# Patient Record
Sex: Male | Born: 1955
Health system: Southern US, Community
[De-identification: ages and names within clinical notes are randomized; demographics above are authoritative.]

## PROBLEM LIST (undated history)

## (undated) DIAGNOSIS — I739 Peripheral vascular disease, unspecified: Secondary | ICD-10-CM

## (undated) DIAGNOSIS — J45909 Unspecified asthma, uncomplicated: Secondary | ICD-10-CM

## (undated) DIAGNOSIS — C801 Malignant (primary) neoplasm, unspecified: Secondary | ICD-10-CM

## (undated) DIAGNOSIS — E781 Pure hyperglyceridemia: Principal | ICD-10-CM

## (undated) DIAGNOSIS — I10 Essential (primary) hypertension: Secondary | ICD-10-CM

## (undated) DIAGNOSIS — E1165 Type 2 diabetes mellitus with hyperglycemia: Secondary | ICD-10-CM

## (undated) DIAGNOSIS — M1611 Unilateral primary osteoarthritis, right hip: Secondary | ICD-10-CM

## (undated) DIAGNOSIS — R0989 Other specified symptoms and signs involving the circulatory and respiratory systems: Secondary | ICD-10-CM

## (undated) DIAGNOSIS — Z1211 Encounter for screening for malignant neoplasm of colon: Secondary | ICD-10-CM

## (undated) DIAGNOSIS — L02419 Cutaneous abscess of limb, unspecified: Secondary | ICD-10-CM

## (undated) DIAGNOSIS — I2510 Atherosclerotic heart disease of native coronary artery without angina pectoris: Secondary | ICD-10-CM

## (undated) DIAGNOSIS — E1151 Type 2 diabetes mellitus with diabetic peripheral angiopathy without gangrene: Secondary | ICD-10-CM

## (undated) DIAGNOSIS — E1159 Type 2 diabetes mellitus with other circulatory complications: Secondary | ICD-10-CM

## (undated) DIAGNOSIS — L97523 Non-pressure chronic ulcer of other part of left foot with necrosis of muscle: Secondary | ICD-10-CM

## (undated) DIAGNOSIS — E785 Hyperlipidemia, unspecified: Secondary | ICD-10-CM

## (undated) DIAGNOSIS — J189 Pneumonia, unspecified organism: Secondary | ICD-10-CM

## (undated) DIAGNOSIS — Z79899 Other long term (current) drug therapy: Secondary | ICD-10-CM

## (undated) DIAGNOSIS — M86172 Other acute osteomyelitis, left ankle and foot: Secondary | ICD-10-CM

## (undated) DIAGNOSIS — L03119 Cellulitis of unspecified part of limb: Secondary | ICD-10-CM

## (undated) DIAGNOSIS — E1169 Type 2 diabetes mellitus with other specified complication: Secondary | ICD-10-CM

## (undated) DIAGNOSIS — I219 Acute myocardial infarction, unspecified: Secondary | ICD-10-CM

## (undated) DIAGNOSIS — E11621 Type 2 diabetes mellitus with foot ulcer: Secondary | ICD-10-CM

## (undated) DIAGNOSIS — M009 Pyogenic arthritis, unspecified: Secondary | ICD-10-CM

## (undated) HISTORY — DX: Pyogenic arthritis, unspecified: M00.9

## (undated) HISTORY — PX: PR VEIN BYPASS GRAFT,AORTO-FEM-POP: 35551

## (undated) HISTORY — DX: Cellulitis of unspecified part of limb: L03.119

## (undated) HISTORY — DX: Other specified symptoms and signs involving the circulatory and respiratory systems: R09.89

## (undated) HISTORY — DX: Type 2 diabetes mellitus with foot ulcer: E11.621

## (undated) HISTORY — DX: Type 2 diabetes mellitus with other specified complication: E11.69

## (undated) HISTORY — DX: Peripheral vascular disease, unspecified: I73.9

## (undated) HISTORY — DX: Encounter for screening for malignant neoplasm of colon: Z12.11

## (undated) HISTORY — DX: Other acute osteomyelitis, left ankle and foot: M86.172

## (undated) HISTORY — DX: Type 2 diabetes mellitus with diabetic peripheral angiopathy without gangrene: E11.51

## (undated) HISTORY — DX: Type 2 diabetes mellitus with hyperglycemia: E11.65

## (undated) HISTORY — DX: Other long term (current) drug therapy: Z79.899

## (undated) HISTORY — DX: Essential (primary) hypertension: I10

## (undated) HISTORY — DX: Type 2 diabetes mellitus with other circulatory complications: E11.59

## (undated) HISTORY — DX: Pure hyperglyceridemia: E78.1

## (undated) HISTORY — DX: Unilateral primary osteoarthritis, right hip: M16.11

## (undated) HISTORY — DX: Atherosclerotic heart disease of native coronary artery without angina pectoris: I25.10

## (undated) HISTORY — PX: TONSILLECTOMY: SUR1361

## (undated) HISTORY — DX: Cutaneous abscess of limb, unspecified: L02.419

## (undated) HISTORY — PX: LEG SURGERY: SHX1003

## (undated) HISTORY — DX: Non-pressure chronic ulcer of other part of left foot with necrosis of muscle: L97.523

## (undated) HISTORY — PX: CARDIAC CATHETERIZATION: SHX172

## (undated) HISTORY — DX: Hyperlipidemia, unspecified: E78.5

---

## 1979-05-06 HISTORY — PX: LEG SURGERY: SHX1003

## 1998-04-14 ENCOUNTER — Emergency Department (HOSPITAL_COMMUNITY): Admission: EM | Admit: 1998-04-14 | Discharge: 1998-04-14 | Payer: Self-pay | Admitting: Emergency Medicine

## 1999-02-03 ENCOUNTER — Emergency Department (HOSPITAL_COMMUNITY): Admission: EM | Admit: 1999-02-03 | Discharge: 1999-02-03 | Payer: Self-pay | Admitting: Emergency Medicine

## 2006-11-21 ENCOUNTER — Emergency Department (HOSPITAL_COMMUNITY): Admission: EM | Admit: 2006-11-21 | Discharge: 2006-11-21 | Payer: Self-pay

## 2007-10-12 ENCOUNTER — Ambulatory Visit: Payer: Self-pay | Admitting: Cardiovascular Disease

## 2007-10-12 ENCOUNTER — Encounter: Payer: Self-pay | Admitting: Emergency Medicine

## 2007-10-12 ENCOUNTER — Inpatient Hospital Stay (HOSPITAL_COMMUNITY): Admission: EM | Admit: 2007-10-12 | Discharge: 2007-10-15 | Payer: Self-pay | Admitting: Cardiovascular Disease

## 2007-10-13 ENCOUNTER — Encounter: Payer: Self-pay | Admitting: Cardiovascular Disease

## 2008-02-01 ENCOUNTER — Ambulatory Visit: Payer: Self-pay | Admitting: Cardiology

## 2008-02-18 ENCOUNTER — Ambulatory Visit: Payer: Self-pay

## 2008-03-06 ENCOUNTER — Ambulatory Visit: Payer: Self-pay | Admitting: Cardiovascular Disease

## 2008-04-16 DIAGNOSIS — I2510 Atherosclerotic heart disease of native coronary artery without angina pectoris: Secondary | ICD-10-CM

## 2008-04-16 DIAGNOSIS — I10 Essential (primary) hypertension: Secondary | ICD-10-CM

## 2008-04-16 DIAGNOSIS — E785 Hyperlipidemia, unspecified: Secondary | ICD-10-CM

## 2008-04-16 DIAGNOSIS — F17200 Nicotine dependence, unspecified, uncomplicated: Secondary | ICD-10-CM

## 2008-04-16 HISTORY — DX: Atherosclerotic heart disease of native coronary artery without angina pectoris: I25.10

## 2008-04-16 HISTORY — DX: Essential (primary) hypertension: I10

## 2008-04-16 HISTORY — DX: Hyperlipidemia, unspecified: E78.5

## 2009-02-09 ENCOUNTER — Telehealth: Payer: Self-pay | Admitting: Cardiology

## 2009-06-06 ENCOUNTER — Ambulatory Visit: Payer: Self-pay | Admitting: Cardiology

## 2009-06-11 ENCOUNTER — Encounter (INDEPENDENT_AMBULATORY_CARE_PROVIDER_SITE_OTHER): Payer: Self-pay

## 2009-06-11 ENCOUNTER — Ambulatory Visit: Payer: Self-pay | Admitting: Cardiology

## 2009-06-11 ENCOUNTER — Ambulatory Visit: Payer: Self-pay | Admitting: Cardiovascular Disease

## 2009-06-20 ENCOUNTER — Ambulatory Visit (HOSPITAL_COMMUNITY): Admission: RE | Admit: 2009-06-20 | Discharge: 2009-06-20 | Payer: Self-pay | Admitting: Cardiovascular Disease

## 2009-06-20 ENCOUNTER — Ambulatory Visit: Payer: Self-pay | Admitting: Cardiovascular Disease

## 2009-06-22 DIAGNOSIS — I7389 Other specified peripheral vascular diseases: Secondary | ICD-10-CM

## 2009-06-22 DIAGNOSIS — E1159 Type 2 diabetes mellitus with other circulatory complications: Secondary | ICD-10-CM | POA: Insufficient documentation

## 2009-06-22 HISTORY — DX: Type 2 diabetes mellitus with other circulatory complications: E11.59

## 2009-06-27 ENCOUNTER — Ambulatory Visit: Payer: Self-pay | Admitting: Endocrinology

## 2009-07-04 ENCOUNTER — Ambulatory Visit: Payer: Self-pay | Admitting: Endocrinology

## 2009-07-05 ENCOUNTER — Telehealth (INDEPENDENT_AMBULATORY_CARE_PROVIDER_SITE_OTHER): Payer: Self-pay | Admitting: *Deleted

## 2009-07-10 ENCOUNTER — Telehealth (INDEPENDENT_AMBULATORY_CARE_PROVIDER_SITE_OTHER): Payer: Self-pay | Admitting: *Deleted

## 2009-07-11 ENCOUNTER — Telehealth: Payer: Self-pay | Admitting: Endocrinology

## 2009-07-16 ENCOUNTER — Ambulatory Visit: Payer: Self-pay | Admitting: Surgery

## 2009-07-20 ENCOUNTER — Ambulatory Visit: Payer: Self-pay | Admitting: Cardiology

## 2009-07-24 ENCOUNTER — Ambulatory Visit: Payer: Self-pay | Admitting: Cardiology

## 2009-07-24 LAB — CONVERTED CEMR LAB
Albumin: 3.7 g/dL (ref 3.5–5.2)
Alkaline Phosphatase: 48 units/L (ref 39–117)
Cholesterol: 163 mg/dL (ref 0–200)
Direct LDL: 72 mg/dL
HDL: 36 mg/dL — ABNORMAL LOW (ref >39.00)
Triglycerides: 448 mg/dL — ABNORMAL HIGH (ref 0.0–149.0)

## 2009-08-03 ENCOUNTER — Ambulatory Visit: Payer: Self-pay | Admitting: Endocrinology

## 2009-08-14 ENCOUNTER — Ambulatory Visit: Payer: Self-pay | Admitting: Surgery

## 2010-01-15 ENCOUNTER — Telehealth: Payer: Self-pay | Admitting: Cardiology

## 2010-01-22 ENCOUNTER — Ambulatory Visit: Payer: Self-pay | Admitting: Cardiology

## 2010-04-08 ENCOUNTER — Telehealth: Payer: Self-pay | Admitting: Cardiology

## 2010-04-30 ENCOUNTER — Telehealth (INDEPENDENT_AMBULATORY_CARE_PROVIDER_SITE_OTHER): Payer: Self-pay | Admitting: *Deleted

## 2010-05-01 ENCOUNTER — Telehealth: Payer: Self-pay | Admitting: Endocrinology

## 2010-06-02 LAB — CONVERTED CEMR LAB
AST: 18 units/L (ref 0–37)
Alkaline Phosphatase: 47 units/L (ref 39–117)
Basophils Absolute: 0 10*3/uL (ref 0.0–0.1)
Basophils Relative: 0.1 % (ref 0.0–3.0)
Bilirubin, Direct: 0.1 mg/dL (ref 0.0–0.3)
CO2: 27 meq/L (ref 19–32)
Calcium: 9 mg/dL (ref 8.4–10.5)
Calcium: 9.9 mg/dL (ref 8.4–10.5)
Chloride: 101 meq/L (ref 96–112)
Chloride: 103 meq/L (ref 96–112)
Cholesterol: 315 mg/dL — ABNORMAL HIGH (ref 0–200)
Creatinine, Ser: 0.8 mg/dL (ref 0.4–1.5)
Direct LDL: 25.5 mg/dL
Direct LDL: 85.3 mg/dL
GFR calc non Af Amer: 106.83 mL/min (ref >60)
Glucose, Bld: 269 mg/dL — ABNORMAL HIGH (ref 70–99)
HCT: 49.7 % (ref 39.0–52.0)
HDL: 20.7 mg/dL — ABNORMAL LOW (ref >39.00)
Hemoglobin: 17.6 g/dL — ABNORMAL HIGH (ref 13.0–17.0)
Hgb A1c MFr Bld: 9.3 % — ABNORMAL HIGH (ref 4.6–6.5)
INR: 1 (ref 0.8–1.0)
MCV: 94.4 fL (ref 78.0–100.0)
Monocytes Absolute: 0.8 10*3/uL (ref 0.1–1.0)
Monocytes Relative: 9.2 % (ref 3.0–12.0)
Platelets: 216 10*3/uL (ref 150.0–400.0)
Potassium: 4.4 meq/L (ref 3.5–5.1)
Prothrombin Time: 10 s (ref 9.1–11.7)
RBC: 5.26 M/uL (ref 4.22–5.81)
RDW: 12.8 % (ref 11.5–14.6)
Sodium: 134 meq/L — ABNORMAL LOW (ref 135–145)
Sodium: 135 meq/L (ref 135–145)
Total CHOL/HDL Ratio: 8
Total Protein: 7 g/dL (ref 6.0–8.3)
Triglycerides: 1803 mg/dL — ABNORMAL HIGH (ref 0.0–149.0)
VLDL: 152.4 mg/dL — ABNORMAL HIGH (ref 0.0–40.0)

## 2010-06-04 NOTE — Assessment & Plan Note (Signed)
Summary: pvd w/claudication      Allergies Added: NKDA  Visit Type:  Follow-up Primary Provider:  None  CC:  Claudication.  History of Present Illness: This is a 55 year-old male with coronary and peripheral arterial disease, presenting for follow-up evaluation of his intermittent claudication. He has a history of lifestyle limiting claudication, right worse than left, with prior ABI of 0.49 on the right and known distal SFA occlusion. He also had a NSTEMI and was treated medically because he refused cath secondary to financial constraints.  He now reports progressive right leg pain with minimal activity, predominately involving the calf muscles. He sometimes has leg pain at rest in the right calf and foot. He has pain within a few steps and has begun walking with a cane. Denies ulcerations. He also has progressive pain in the left calf and foot, but not as bad as the right. complains of numbness and hypersensistivity in both legs.  No chest pain or shortness of breath. No stroke or TIa symptoms. He is taking ASA and Plavix daily. Smokes 1 ppd - prescribed Chantix by Dr Shirlee Latch but hasn't picked up Rx yet.  Current Medications (verified): 1)  Lisinopril 40 Mg Tabs (Lisinopril) .... Take One Tablet Once Daily 2)  Pravastatin Sodium 40 Mg Tabs (Pravastatin Sodium) .... Take 2 Tablets At Bedtime 3)  Metoprolol Tartrate 50 Mg Tabs (Metoprolol Tartrate) .... Take One Tablet Two Times A Day 4)  Plavix 75 Mg Tabs (Clopidogrel Bisulfate) .... Take One Tablet Once Daily 5)  Aspir-Low 81 Mg Tbec (Aspirin) .... One Daily  Allergies (verified): No Known Drug Allergies  Past History:  Past medical, surgical, family and social histories (including risk factors) reviewed, and no changes noted (except as noted below).  Past Medical History: Reviewed history from 06/06/2009 and no changes required. 1. Non-Hodgkin lymphoma.  This was treated about 10 years ago with radiation, it is now in remission.   He had it in his axillary area and inguinal areas. 2. Formerly morbidly obese, but lost 100 pounds by diet and exercise. 3. Hypertension. 4. Hypercholesterolemia. 5. History of alcohol abuse. 6. Current smoker.  He smokes a pack a day.  ? COPD.  7. Coronary artery disease.  The patient did have a non-ST-elevation MI in June 2009 that was treated medically.  The patient did refuse heart catheterization. 8. Echocardiogram done in June 2009 showed an EF of 60%, mild LVH, no regional wall motion abnormalities.  9. PAD.  He underwent ABIs and lower extremity duplex scanning on February 18, 2008.  This demonstrated occlusion of the distal right superficial femoral artery with reconstitution at the level of the popliteal artery.  There appeared to be some degree of inflow disease because of biphasic common femoral waveforms.  The ABI was 0.49 on the right and 0.87 on the left.   Past Surgical History: none  Family History: Reviewed history from 04/16/2008 and no changes required. The patient's mother had a myocardial infarction in her 30s.  He had 2 aunts with myocardial infarctions in their 64s.  His father had peripheral arterial disease.   Social History: Reviewed history from 06/06/2009 and no changes required. The patient lives with his wife.  He is an unemployed Investment banker, operational, originally from [place].  He has no children.  He smokes 1 pack a day and he has done so for years.  He used to drink heavily but has quit.   Review of Systems       Negative except  as per HPI   Vital Signs:  Patient profile:   55 year old male Height:      69.5 inches Weight:      201 pounds BMI:     29.36 Pulse rate:   76 / minute Pulse rhythm:   regular Resp:     18 per minute BP sitting:   120 / 80  (left arm) Cuff size:   large  Vitals Entered By: Vikki Ports (June 11, 2009 10:51 AM)  Serial Vital Signs/Assessments:  Time      Position  BP       Pulse  Resp  Temp     By           R Arm      116/80                         Vikki Ports   Physical Exam  General:  Pt is alert and oriented, in no acute distress. HEENT: poor dentition, otherwise normal Neck: normal carotid upstrokes without bruits, JVP normal Lungs: CTA CV: RRR without murmur or gallop Abd: soft, NT, positive BS, no bruit, no organomegaly Ext: no clubbing, cyanosis, or edema. femoral pulses 2+= with bilateral bruits, pedal pulses not palpable, right toes are cool but not discolored Skin: warm and dry without rash    Impression & Recommendations:  Problem # 1:  ATHEROSCLEROSIS W/ REST PAIN (ICD-440.22) The patient has progressive symptoms of lower extremity arterial insufficiency, now with symptoms on minimal activity and occasionally at rest. With an ABI approaching the severe range over one year ago, and now with progressive symptoms, I have recommended lower extremity angiography with consideration of PTA vs surgical revascularization. He continues on Aspirin and Plavix. Unfortunately he continues to smoke, and has been prescribed Chantix by Dr Shirlee Latch - I reinforced the importance of tobacco cessation with respect to his PAD outcome. Risks and indications of lower extremity angiography were reviewed with the pt and his wife in detail, and they agree to proceed.  Problem # 2:  CAD, NATIVE VESSEL (ICD-414.01) The pt has had a NSTEMI. He has no symptoms at present, but is extremely limited by his leg problems. If he requires surgical revascularization for PAD, he will need to be risk-stratified with a nuclear stress study. I will confer with Dr Shirlee Latch at the time of his angiogram to further discuss.  His updated medication list for this problem includes:    Lisinopril 40 Mg Tabs (Lisinopril) .Marland Kitchen... Take one tablet once daily    Metoprolol Tartrate 50 Mg Tabs (Metoprolol tartrate) .Marland Kitchen... Take one tablet two times a day    Plavix 75 Mg Tabs (Clopidogrel bisulfate) .Marland Kitchen... Take one tablet once daily    Aspir-low 81 Mg  Tbec (Aspirin) ..... One daily  Orders: TLB-BMP (Basic Metabolic Panel-BMET) (80048-METABOL) TLB-Lipid Panel (80061-LIPID) TLB-Hepatic/Liver Function Pnl (80076-HEPATIC) TLB-CBC Platelet - w/Differential (85025-CBCD) TLB-PT (Protime) (85610-PTP) PV Procedure (PV Procedure)  Problem # 3:  TOBACCO ABUSE (ICD-305.1) Reiterated the importance of tobacco cessation.  Patient Instructions: 1)  Your physician has requested that you have a peripheral vascular angiogram. This exam is performed at the hospital. During this exam IV contrast is used to look at arterial blood flow.  Please review the information sheet given for details.

## 2010-06-04 NOTE — Letter (Signed)
Summary: Peripheral Vascular  [facility], Main Office  [address]   [place], [place] [address]   Phone: [phone]  Fax: [phone]     06/11/2009 MRN: [id number]  [name] [address] [place], [place]  [address]  Dear Mr. MITTELMAN,   You are scheduled for Peripheral Vascular Angiogram on Wednesday June 20, 2009 with Dr. Excell Seltzer.  Please arrive at the [facility] (Go to the [employer] on [address]) of [facility] at 11:00 a.m. on the day of your procedure.  1. DIET     _X___ Nothing to eat or drink after midnight except your medications with a sip of water.  2. MAKE SURE YOU TAKE YOUR ASPIRIN AND PLAVIX.  3. __X__ YOU MAY TAKE ALL of your remaining medications with a small amount of water.  4. Plan for one night stay - bring personal belongings (i.e. toothpaste, toothbrush, etc.)  5. Bring a current list of your medications and current insurance cards.  6. Must have a responsible person to drive you home.   7. Someone must be with you for the first 24 hours after you arrive home.  8. Please wear clothes that are easy to get on and off and wear slip-on shoes.  *Special note: Every effort is made to have your procedure done on time.  Occasionally there are emergencies that present themselves at the hospital that may cause delays.  Please be patient if a delay does occur.  If you have any questions after you get home, please call the office at the number listed above.   Julieta Gutting, RN, BSN

## 2010-06-04 NOTE — Progress Notes (Signed)
Summary: refill request  Phone Note Refill Request Message from:  Patient on January 15, 2010 2:51 PM  Refills Requested: Medication #1:  LOVAZA 1 GM CAPS take 2 capsules twice a day  Medication #2:  PLAVIX 75 MG TABS take one tablet once daily pt requesting lovaza for 90 day supply [facility]   Method Requested: Telephone to Pharmacy Initial call taken by: Glynda Jaeger,  January 15, 2010 2:51 PM  Follow-up for Phone Call       Follow-up by: Judithe Modest CMA,  January 15, 2010 4:30 PM    Prescriptions: LOVAZA 1 GM CAPS (OMEGA-3-ACID ETHYL ESTERS) take 2 capsules twice a day  #360 x 1   Entered by:   Judithe Modest CMA   Authorized by:   [name], MD   Signed by:   Judithe Modest CMA on 01/15/2010   Method used:   Electronically to        [facility] (retail)       [address]       [place], [place]  [address]       Ph: [phone]       Fax: [phone]   RxID:   [id number] PLAVIX 75 MG TABS (CLOPIDOGREL BISULFATE) take one tablet once daily  #90 x 3   Entered by:   Judithe Modest CMA   Authorized by:   [name], MD   Signed by:   Judithe Modest CMA on 01/15/2010   Method used:   Electronically to        [facility] (retail)       [address]       [place], [place]  [address]       Ph: [phone]       Fax: [phone]   RxID:   [id number]

## 2010-06-04 NOTE — Progress Notes (Signed)
Summary: rx refill  Phone Note Refill Request Message from:  Patient on April 08, 2010 8:17 AM  CILOSTAZOL 100MG  TABLETS please call in it to [facility]# [phone]   Method Requested: Telephone to Pharmacy Initial call taken by: Roe Coombs,  April 08, 2010 8:19 AM    Prescriptions: PLETAL 100 MG TABS (CILOSTAZOL) one tablet twice a day  #60 Tablet x 8   Entered by:   Hardin Negus, RMA   Authorized by:   [name], MD   Signed by:   Hardin Negus, RMA on 04/09/2010   Method used:   Electronically to        [phone] (retail)       [address] [place], [place]  [address]       Ph: [phone]       Fax: [phone]   RxID:   [id number]

## 2010-06-04 NOTE — Progress Notes (Signed)
  Recieved Request from [facility] for Records forwarded to [facility] for processing [facility]  July 05, 2009 1:19 PM

## 2010-06-04 NOTE — Progress Notes (Signed)
  Phone Note Call from Patient Call back at Home Phone [phone]   Caller: Patient Call For: Dr Everardo All Summary of Call: Pt concerned this am again as his blood sugar is 90. Pt states he did reduce the medication as instructed yesterday. Pt would like a call regarding his blood sugar and medication. Please advise. Initial call taken by: Verdell Face,  July 11, 2009 8:16 AM  Follow-up for Phone Call        reduce glimepiride to 1/2 of 4 mg each am only Follow-up by: Minus Breeding MD,  July 11, 2009 9:03 AM  Additional Follow-up for Phone Call Additional follow up Details #1::        pt informed Additional Follow-up by: Margaret Pyle, CMA,  July 11, 2009 9:24 AM    New/Updated Medications: GLIMEPIRIDE 4 MG TABS (GLIMEPIRIDE) 1/2 tab each am

## 2010-06-04 NOTE — Assessment & Plan Note (Signed)
Summary: 9 month rov Pia Mau   Primary Provider:  Minus Breeding MD   History of Present Illness: 55 yo with history of CAD, severe PAD, and diabetes presents for followup.  He had NSTEMI in 6/09 that was treated medically as patient refused catheterization due to financial issues.  He also has severe claudication. Patient had catheterization in 2/11 showing totally occluded circumflex artery with some collaterals as well as severe PAD (see PMH for description).  His best option for revascularization would be a fem-pop bypass. Patient saw vascular surgery but decided against bypass procedure for now.   Patient denies any chest pain.  Cilostazol has been helping his claudication.  He still gets significant left greater than right foot and calf pain with walking about 20 feet, but before, he was getting symptoms with any exertion and sometimes at rest.  No significant exertional dyspnea.  Patient quit smoking using Chantix but developed ankle swelling (probably due to Actos) which he attributed to nicotine withdrawal, so he started smoking again. Fasting blood glucose is running in the 90s-100s. Weight is down 2 lbs since last appointment.   Labs (3/11): HDL 20 => 36, LDL 72, TGs 1610 => 448 Labs (2/11): K 4.4, creatinine 0.8    Current Medications (verified): 1)  Lisinopril 40 Mg Tabs (Lisinopril) .... Take One Tablet Once Daily 2)  Pravastatin Sodium 40 Mg Tabs (Pravastatin Sodium) .... Take 2 Tablets At Bedtime 3)  Metoprolol Tartrate 50 Mg Tabs (Metoprolol Tartrate) .... Take One Tablet Two Times A Day 4)  Plavix 75 Mg Tabs (Clopidogrel Bisulfate) .... Take One Tablet Once Daily 5)  Aspir-Low 81 Mg Tbec (Aspirin) .... One Daily 6)  Lovaza 1 Gm Caps (Omega-3-Acid Ethyl Esters) .... Take 2 Capsules Twice A Day 7)  Metformin Hcl 500 Mg Xr24h-Tab (Metformin Hcl) .... 2 Tabs Bid 8)  Freestyle Lite Test  Strp (Glucose Blood) .... Two Times A Day, and Lancets 250.00 9)  Aspirin Ec 325 Mg Tbec  (Aspirin) .... As Needed For Pain. 10)  Pletal 100 Mg Tabs (Cilostazol) .... One Tablet Twice A Day 11)  Glimepiride 1 Mg Tabs (Glimepiride) .Marland Kitchen.. 1 By Mouth Dialy  Allergies (verified): No Known Drug Allergies  Past History:  Past Medical History: 1. Non-Hodgkin lymphoma.  This was treated about 10 years ago with radiation, it is now in remission.  He had it in his axillary area and inguinal areas. 2. Formerly morbidly obese, but lost 100 pounds by diet and exercise. 3. Hypertension. 4. Hypercholesterolemia. 5. History of alcohol abuse. 6. Current smoker.  He smokes a pack a day.  ? COPD.  7. Coronary artery disease.  The patient did have a non-ST-elevation MI in June 2009 that was treated medically.  The patient refused LHC at that time.  LHC done 2/11 showed totally occluded CFX with faint R->L collaterals, 40-50% proximal LAD, patent RCA.  Renal arteries were patent.  8. Echocardiogram done in June 2009 showed an EF of 60%, mild LVH, no regional wall motion abnormalities.  9. PAD.  He underwent ABIs and lower extremity duplex scanning on February 18, 2008.  This demonstrated occlusion of the distal right superficial femoral artery with reconstitution at the level of the popliteal artery.  There appeared to be some degree of inflow disease because of biphasic common femoral waveforms.  The ABI was 0.49 on the right and 0.87 on the left.  Peripheral angiogram (2/11) showed 50% L CIA stenosis, 90% stenosis of L SFA and popliteal, totally  occluded R SFA with reconstitution of popliteal.  Best option would be fem-pop bypass.  He has seen Dr. Myra Gianotti and wanted medical treatment for now.  10. Type II diabetes: Peripheral edema with Actos.   Family History: Reviewed history from 07/24/2009 and no changes required. The patient's mother had a myocardial infarction in her 48s.  He had 2 aunts with myocardial infarctions in their 42s.  His father had peripheral arterial disease.  No diabetes in his  immediate family.  Social History: Reviewed history from 06/06/2009 and no changes required. The patient lives with his wife.  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  He smokes 1 pack a day and he has done so for years.  He used to drink heavily but has quit.   Review of Systems       All systems reviewed and negative except as per HPI.   Vital Signs:  Patient profile:   55 year old male Height:      69.5 inches Weight:      202 pounds BMI:     29.51 Pulse rate:   75 / minute Resp:     16 per minute BP sitting:   118 / 79  (right arm)  Vitals Entered By: Marrion Coy, CNA (January 22, 2010 8:03 AM)  Physical Exam  General:  Well developed, well nourished, in no acute distress. Neck:  Neck supple, no JVD. No masses, thyromegaly or abnormal cervical nodes. Lungs:  Clear bilaterally to auscultation and percussion. Heart:  Non-displaced PMI, chest non-tender; regular rate and rhythm, S1, S2 without murmurs, rubs, +S4. Carotid upstroke normal, no bruit. I am unable to palpate pedal pulses.  The feet are cool.  There are no open ulcerations or sores.   Abdomen:  Bowel sounds positive; abdomen soft and non-tender without masses, organomegaly, or hernias noted. No hepatosplenomegaly. Extremities:  No clubbing or cyanosis. Neurologic:  Alert and oriented x 3. Psych:  Normal affect.   Impression & Recommendations:  Problem # 1:  PVD WITH CLAUDICATION (ICD-443.89) Severe PAD with significant claudication.  Not a good candidate for percutaneous revascularization.  No limb-threatening ischemia.  He saw Dr. Myra Gianotti and has opted for medical treatment for now rather than fem-pop bypass.  - Needs to stop smoking - Continue ASA, statin, Plavix - Continue cilostazol - Continue to try to walk through the pain.   Problem # 2:  CAD, NATIVE VESSEL (ICD-414.01) History of prior MI with circumflex occlusion.  No chest pain.  Continue medical management  with ACEI, Plavix, beta blocker, ASA, statin.   Problem # 3:  HYPERLIPIDEMIA-MIXED (ICD-272.4) Lipids/LFTs today.  Goal LDL < 70.   Problem # 4:  HYPERTENSION, UNSPECIFIED (ICD-401.9) BP at goal on current meds.   Problem # 5:  SMOKING Chantix worked well in the past, helping him to quit.  He restarted smoking because he thought nicotine withdrawal caused peripheral edema (it was likely Actos).  I think that the benefits of stopping smoking in this patient outweigh the small increased cardiovascular risk of Chantix (we know it has worked in the past for him).  I will give him another Chantix prescription.   Other Orders: TLB-A1C / Hgb A1C (Glycohemoglobin) (83036-A1C) TLB-BMP (Basic Metabolic Panel-BMET) (80048-METABOL) TLB-Hepatic/Liver Function Pnl (80076-HEPATIC) TLB-Lipid Panel (80061-LIPID)  Patient Instructions: 1)  Your physician has recommended you make the following change in your medication:  2)  Use chantix to help you stop smoking. 3)  Your physician  recommends that you return for a FASTING lipid profile/liver profile/BMP/ HGB A1C  414.01  401.9 4)  Your physician wants you to follow-up in:  6 months with Dr Shirlee Latch. You will receive a reminder letter in the mail two months in advance. If you don't receive a letter, please call our office to schedule the follow-up appointment. Prescriptions: CHANTIX CONTINUING MONTH PAK 1 MG TABS (VARENICLINE TARTRATE) take as directed  #1 x 1   Entered by:   Katina Dung, RN, BSN   Authorized by:   [name], MD   Signed by:   Katina Dung, RN, BSN on 01/22/2010   Method used:   Electronically to        [facility] (retail)       [address]       [place], [place]  [address]       Ph: [phone]       Fax: [phone]   RxID:   [id number] CHANTIX STARTING MONTH PAK 0.5 MG X 11 & 1 MG X 42 TABS (VARENICLINE TARTRATE) take as directed  #1 x 0   Entered by:   Katina Dung, RN, BSN    Authorized by:   [name], MD   Signed by:   Katina Dung, RN, BSN on 01/22/2010   Method used:   Electronically to        [facility] (retail)       [address]       [place], [place]  [address]       Ph: [phone]       Fax: [phone]   RxID:   [id number]

## 2010-06-04 NOTE — Progress Notes (Signed)
Summary: CBG  Phone Note Call from Patient Call back at Home Phone [phone]   Caller: Patient Call For: Dr Everardo All Summary of Call: Pt called to let nurse know sugar was 91 this am and he was real shaky. Pt just started taking new med yesterday. Please advise. Initial call taken by: Verdell Face,  July 10, 2009 8:19 AM  Follow-up for Phone Call        reduce glimepiride to 1/2 of 4 mg two times a day ret as scheduled Follow-up by: Minus Breeding MD,  July 10, 2009 9:06 AM  Additional Follow-up for Phone Call Additional follow up Details #1::        Informed pt and told pt to call us back if he has any addt problems or questions. Additional Follow-up by: Josph Macho RMA,  July 10, 2009 10:12 AM    New/Updated Medications: GLIMEPIRIDE 4 MG TABS (GLIMEPIRIDE) 1/2 tab two times a day

## 2010-06-04 NOTE — Assessment & Plan Note (Signed)
Summary: NEW ENDO [employer] DM II PT-PKG/OFF--PER [facility]/DR [facility]   Vital Signs:  Patient profile:   55 year old male Height:      69.5 inches (176.53 cm) Weight:      201.38 pounds (91.54 kg) O2 Sat:      94 % on Room air Temp:     98.9 degrees F (37.17 degrees C) oral Pulse rate:   79 / minute BP sitting:   112 / 78  (left arm) Cuff size:   large  Vitals Entered By: Josph Macho RMA (June 27, 2009 8:53 AM)  O2 Flow:  Room air CC: New Endo: Diabetic Management/ CF Is Patient Diabetic? Yes   Referring Provider:  Eartha Inch Primary Provider:  None  CC:  New Endo: Diabetic Management/ CF.  History of Present Illness: pt says he was noted to have glycosuria approx 2 years ago.  he was then noted by dr Shirlee Latch to have severe hyperglycemia more recently.  he was rx'ed metformin.   symptomatically, pt states few mos of moderate polydipsia, and associated numbness of the feet. he feels somewhat better since he was rx'ed the  metformin, but it gives him abdominal bloating.  Current Medications (verified): 1)  Lisinopril 40 Mg Tabs (Lisinopril) .... Take One Tablet Once Daily 2)  Pravastatin Sodium 40 Mg Tabs (Pravastatin Sodium) .... Take 2 Tablets At Bedtime 3)  Metoprolol Tartrate 50 Mg Tabs (Metoprolol Tartrate) .... Take One Tablet Two Times A Day 4)  Plavix 75 Mg Tabs (Clopidogrel Bisulfate) .... Take One Tablet Once Daily 5)  Aspir-Low 81 Mg Tbec (Aspirin) .... One Daily 6)  Metformin Hcl 500 Mg Tabs (Metformin Hcl) .... One Tablet Twice A Day 7)  Lovaza 1 Gm Caps (Omega-3-Acid Ethyl Esters) .... Take 2 Capsules Twice A Day  Allergies (verified): No Known Drug Allergies  Past History:  Past Medical History: Last updated: 06/06/2009 1. Non-Hodgkin lymphoma.  This was treated about 10 years ago with radiation, it is now in remission.  He had it in his axillary area and inguinal areas. 2. Formerly morbidly obese, but lost 100 pounds by diet and exercise. 3.  Hypertension. 4. Hypercholesterolemia. 5. History of alcohol abuse. 6. Current smoker.  He smokes a pack a day.  ? COPD.  7. Coronary artery disease.  The patient did have a non-ST-elevation MI in June 2009 that was treated medically.  The patient did refuse heart catheterization. 8. Echocardiogram done in June 2009 showed an EF of 60%, mild LVH, no regional wall motion abnormalities.  9. PAD.  He underwent ABIs and lower extremity duplex scanning on February 18, 2008.  This demonstrated occlusion of the distal right superficial femoral artery with reconstitution at the level of the popliteal artery.  There appeared to be some degree of inflow disease because of biphasic common femoral waveforms.  The ABI was 0.49 on the right and 0.87 on the left.   Family History: The patient's mother had a myocardial infarction in her 12s.  He had 2 aunts with myocardial infarctions in their 67s.  His father had peripheral arterial disease.  no dm in his immediate family.  Social History: Reviewed history from 06/06/2009 and no changes required. The patient lives with his wife.  He is an unemployed Investment banker, operational, originally from [place].  He has no children.  He smokes 1 pack a day and he has done so for years.  He used to drink heavily but has quit.   Review of Systems  The patient complains of headaches.         denies chest pain, sob, n/v, dysuria, cramps, memory loss, depression, and hypoglycemia.  he had weight loss many years ago, but is now slowly regaining.  he has slight blurry vision, easy bruising, and muscle cramps.   Physical Exam  General:  normal appearance.   Head:  head: no deformity eyes: no periorbital swelling, no proptosis external nose and ears are normal mouth: no lesion seen Neck:  Supple without thyroid enlargement or tenderness.  Lungs:  Clear to auscultation bilaterally. Normal respiratory effort.  Heart:  Regular rate and rhythm without murmurs or gallops noted.  Normal S1,S2.   Abdomen:  abdomen is soft, nontender.  no hepatosplenomegaly.   not distended.  no hernia  Msk:  muscle bulk and strength are grossly normal.  no obvious joint swelling.  gait is normal and steady  Pulses:  dorsalis pedis are absent bilaterally Extremities:  no deformity.  no ulcer on the feet.  feet are of normal color and temp, except for slight rust-color.  no edema.  there is bilateral onychomycosis.    Neurologic:  cn 2-12 grossly intact.   readily moves all 4's.   sensation is intact to touch on the left foot, but absent on the right. Skin:  normal texture and temp.  no rash.  not diaphoretic  Cervical Nodes:  No significant adenopathy.  Psych:  Alert and cooperative; normal mood and affect; normal attention span and concentration.   Additional Exam:  Sodium               [L]  134 mEq/L                   135-145   Potassium                 4.4 mEq/L                   3.5-5.1   Chloride                  101 mEq/L                   96-112   Carbon Dioxide            27 mEq/L                    19-32   Glucose              [H]  269 mg/dL                   27-25   BUN                       14 mg/dL                    3-66   Creatinine                0.8 mg/dL                   4.4-0.3   Calcium                   9.0 mg/dL     Impression & Recommendations:  Problem # 1:  DIAB W/O COMP TYPE II/UNS NOT STATED UNCNTRL (ICD-250.00) needs increased rx  Problem # 2:  TOBACCO ABUSE (ICD-305.1) this drastically exacerbstes  the adverse effects of #1  Problem # 3:  abdominal bloating due to metformin  Problem # 4:  PVD WITH CLAUDICATION (ICD-443.89) exac by # 1 and #2  Medications Added to Medication List This Visit: 1)  Metformin Hcl 500 Mg Xr24h-tab (Metformin hcl) .... 2 tabs bid 2)  Januvia 100 Mg Tabs (Sitagliptin phosphate) .Marland Kitchen.. 1 each am 3)  Freestyle Lite Test Strp (Glucose blood) .... Two times a day, and lancets 250.00  Other Orders: Consultation Level IV  (02725)  Patient Instructions: 1)  we discussed the importance of diet and exercise therapy and the risks of diabetes.  you should see an eye doctor every year. 2)  it is very important to keep good control of blood pressure and cholesterol, especially in those with diabetes.  stopping smoking also reduces the damage diabetes does to your body.  please discuss these with your doctor.  you should take an aspirin every day, unless you have been advised by a doctor not to. 3)  i told pt we will need to take this complex situation in stages. 4)  check your blood sugar 2 times a day.  vary the time of day when you check, between before the 3 meals, and at bedtime.  also check if you have symptoms of your blood sugar being too high or too low.  please keep a record of the readings and bring it to your next appointment here.  please call us sooner if you are having low blood sugar episodes. 5)  increase metformin to 2x500 mg two times a day for now (i changes to extended-release) 6)  also, add januvia 100 mg each am. 7)  Please schedule a follow-up appointment in 1 week. Prescriptions: METFORMIN HCL 500 MG XR24H-TAB (METFORMIN HCL) 2 tabs bid  #360 x 3   Entered and Authorized by:   Minus Breeding MD   Signed by:   Minus Breeding MD on 06/27/2009   Method used:   Electronically to        [facility] (retail)       [address]       [place], [place]  [address]       Ph: [phone]       Fax: [phone]   RxID:   [id number] FREESTYLE LITE TEST  STRP (GLUCOSE BLOOD) two times a day, and lancets 250.00  #100 x 11   Entered and Authorized by:   Minus Breeding MD   Signed by:   Minus Breeding MD on 06/27/2009   Method used:   Electronically to        [facility] (retail)       [address]       [place], [place]  [address]       Ph: [phone]       Fax: [phone]   RxID:   [id number] METFORMIN HCL 500 MG XR24H-TAB  (METFORMIN HCL) 2 tabs bid  #120 x 11   Entered and Authorized by:   Minus Breeding MD   Signed by:   Minus Breeding MD on 06/27/2009   Method used:   Electronically to        [facility] (retail)       [address]       [place], [place]  [address]       Ph: [phone]       Fax: [phone]   RxID:   [id number]

## 2010-06-04 NOTE — Assessment & Plan Note (Signed)
Summary: 1 MTH FU  STC   Vital Signs:  Patient profile:   55 year old male Height:      69.5 inches (176.53 cm) Weight:      210.25 pounds (95.57 kg) O2 Sat:      96 % on Room air Temp:     97.9 degrees F (36.61 degrees C) oral Pulse rate:   82 / minute BP sitting:   118 / 72  (left arm) Cuff size:   large  Vitals Entered By: Josph Macho RMA (August 03, 2009 8:57 AM)  O2 Flow:  Room air CC: 1 month follow up/ CF Is Patient Diabetic? Yes   Referring Provider:  Eartha Inch Primary Provider:  None  CC:  1 month follow up/ CF.  History of Present Illness: he brings a record of his cbg's which i have reviewed today.  it varies from 77-180 (most approx 100), with no trend throughout the day.  pt states he feels well in general.  he has quit smoking.  Current Medications (verified): 1)  Lisinopril 40 Mg Tabs (Lisinopril) .... Take One Tablet Once Daily 2)  Pravastatin Sodium 40 Mg Tabs (Pravastatin Sodium) .... Take 2 Tablets At Bedtime 3)  Metoprolol Tartrate 50 Mg Tabs (Metoprolol Tartrate) .... Take One Tablet Two Times A Day 4)  Plavix 75 Mg Tabs (Clopidogrel Bisulfate) .... Take One Tablet Once Daily 5)  Aspir-Low 81 Mg Tbec (Aspirin) .... One Daily 6)  Lovaza 1 Gm Caps (Omega-3-Acid Ethyl Esters) .... Take 2 Capsules Twice A Day 7)  Metformin Hcl 500 Mg Xr24h-Tab (Metformin Hcl) .... 2 Tabs Bid 8)  Januvia 100 Mg Tabs (Sitagliptin Phosphate) .Marland Kitchen.. 1 Each Am 9)  Freestyle Lite Test  Strp (Glucose Blood) .... Two Times A Day, and Lancets 250.00 10)  Actos 45 Mg Tabs (Pioglitazone Hcl) .Marland Kitchen.. 1 Qd 11)  Glimepiride 4 Mg Tabs (Glimepiride) .Marland Kitchen.. 1 Tab Each Am 12)  Aspirin Ec 325 Mg Tbec (Aspirin) .... As Needed For Pain. 13)  Pletal 100 Mg Tabs (Cilostazol) .... One Tablet Twice A Day  Allergies (verified): No Known Drug Allergies  Past History:  Past Medical History: Last updated: 07/24/2009 1. Non-Hodgkin lymphoma.  This was treated about 10 years ago with radiation,  it is now in remission.  He had it in his axillary area and inguinal areas. 2. Formerly morbidly obese, but lost 100 pounds by diet and exercise. 3. Hypertension. 4. Hypercholesterolemia. 5. History of alcohol abuse. 6. Current smoker.  He smokes a pack a day.  ? COPD.  7. Coronary artery disease.  The patient did have a non-ST-elevation MI in June 2009 that was treated medically.  The patient refused LHC at that time.  LHC done 2/11 showed totally occluded CFX with faint R->L collaterals, 40-50% proximal LAD, patent RCA.  Renal arteries were patent.  8. Echocardiogram done in June 2009 showed an EF of 60%, mild LVH, no regional wall motion abnormalities.  9. PAD.  He underwent ABIs and lower extremity duplex scanning on February 18, 2008.  This demonstrated occlusion of the distal right superficial femoral artery with reconstitution at the level of the popliteal artery.  There appeared to be some degree of inflow disease because of biphasic common femoral waveforms.  The ABI was 0.49 on the right and 0.87 on the left.  Peripheral angiogram (2/11) showed 50% L CIA stenosis, 90% stenosis of L SFA and popliteal, totally occluded R SFA with reconstitution of popliteal.  Best option would  be fem-pop bypass.  He has seen Dr. Myra Gianotti and wanted medical treatment for now.   Review of Systems  The patient denies hypoglycemia.    Physical Exam  General:  normal appearance.   Extremities:  no edema   Impression & Recommendations:  Problem # 1:  DIAB W/O COMP TYPE II/UNS NOT STATED UNCNTRL (ICD-250.00) Assessment Improved  Medications Added to Medication List This Visit: 1)  Glimepiride 4 Mg Tabs (Glimepiride) .Marland Kitchen.. 1 tab each am 2)  Glimepiride 1 Mg Tabs (Glimepiride) .... 1/2 tab each am  Other Orders: Est. Patient Level III ([address])  Patient Instructions: 1)  check your blood sugar 2 times a day.  vary the time of day when you check, between before the 3 meals, and at bedtime.  also check if  you have symptoms of your blood sugar being too high or too low.  please keep a record of the readings and bring it to your next appointment here.  please call us sooner if you are having low blood sugar episodes. 2)  reduce glimepiride to 1/2 of 1 mg each am.  if blood sugar continues to be below 100, stop it altogether. 3)  Please schedule a follow-up appointment in 1 month. Prescriptions: GLIMEPIRIDE 1 MG TABS (GLIMEPIRIDE) 1/2 tab each am  #30 x 5   Entered and Authorized by:   Minus Breeding MD   Signed by:   Minus Breeding MD on 08/03/2009   Method used:   Electronically to        [facility] (retail)       [address]       [place], [place]  [address]       Ph: [phone]       Fax: [phone]   RxID:   [id number]

## 2010-06-04 NOTE — Assessment & Plan Note (Signed)
Summary: ROV PER PT CALL/LG  Medications Added PLAVIX 75 MG TABS (CLOPIDOGREL BISULFATE) take one tablet once daily ASPIR-LOW 81 MG TBEC (ASPIRIN) one daily CHANTIX STARTING MONTH PAK 0.5 MG X 11 & 1 MG X 42 TABS (VARENICLINE TARTRATE) take as directed CHANTIX CONTINUING MONTH PAK 1 MG TABS (VARENICLINE TARTRATE) take as directed      Allergies Added: NKDA  Primary Provider:  None  CC:  rov/ pt has not been seen here in about a year and a half.  He states he has been keeping up on his medications.   Pt also states the claudication in his legs has gotten worse.  Marland Kitchen  History of Present Illness: [name] returns after a long hiatus from cardiology clinic because of financial issues.  He had NSTEMI in 6/09 that was treated medically as patient refused catheterization due to financial issues.  He also had severe claudication and evidence by arterial dopplers of severe PAD.  Patient was lost to followup from 2009 until now.  He returns now with worsening claudication.  His right leg is now numb from the knee down and very weak.  He gets pain in the right foot and calf even with just standing up.  Any exertion causes severe pain in the legs bilaterally, right > left.  Small cuts take a long time to heal.  His feet feel cold.  No ulcerations.  He is using his cane to walk.  He still smokes a pack a day.    He has actually had only 1 episode of chest pain in the last 2 years.  This occurred about a year ago when he tried to do some yardwork and resolved with NTG.  No chest pain at all since then.  He gets very severe leg pain and shortness of breath if he tries to climb a flight of steps.    Of note, financial situation is better as his wife has a job as an Airline pilot.   ECG: NSR, left atrial enlargement  Current Medications (verified): 1)  Lisinopril 40 Mg Tabs (Lisinopril) .... Take One Tablet Once Daily 2)  Pravastatin Sodium 40 Mg Tabs (Pravastatin Sodium) .... Take 2 Tablets At Bedtime 3)   Metoprolol Tartrate 50 Mg Tabs (Metoprolol Tartrate) .... Take One Tablet Two Times A Day 4)  Plavix 75 Mg Tabs (Clopidogrel Bisulfate) .... Take One Tablet Once Daily 5)  Aspir-Low 81 Mg Tbec (Aspirin) .... One Daily 6)  Chantix Starting Month Pak 0.5 Mg X 11 & 1 Mg X 42 Tabs (Varenicline Tartrate) .... Take As Directed 7)  Chantix Continuing Month Pak 1 Mg Tabs (Varenicline Tartrate) .... Take As Directed  Allergies (verified): No Known Drug Allergies  Past History:  Past Medical History: 1. Non-Hodgkin lymphoma.  This was treated about 10 years ago with radiation, it is now in remission.  He had it in his axillary area and inguinal areas. 2. Formerly morbidly obese, but lost 100 pounds by diet and exercise. 3. Hypertension. 4. Hypercholesterolemia. 5. History of alcohol abuse. 6. Current smoker.  He smokes a pack a day.  ? COPD.  7. Coronary artery disease.  The patient did have a non-ST-elevation MI in June 2009 that was treated medically.  The patient did refuse heart catheterization. 8. Echocardiogram done in June 2009 showed an EF of 60%, mild LVH, no regional wall motion abnormalities.  9. PAD.  He underwent ABIs and lower extremity duplex scanning on February 18, 2008.  This demonstrated occlusion of the  distal right superficial femoral artery with reconstitution at the level of the popliteal artery.  There appeared to be some degree of inflow disease because of biphasic common femoral waveforms.  The ABI was 0.49 on the right and 0.87 on the left.   Family History: Reviewed history from 04/16/2008 and no changes required. The patient's mother had a myocardial infarction in her 80s.  He had 2 aunts with myocardial infarctions in their 84s.  His father had peripheral arterial disease.   Social History: The patient lives with his wife.  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  He smokes 1 pack a day and he has done so for years.  He used to  drink heavily but has quit.   Review of Systems       All systems reviewed and negative except as per HPI.   Vital Signs:  Patient profile:   55 year old male Height:      69.5 inches Weight:      200 pounds BMI:     29.22 Pulse rate:   94 / minute Pulse rhythm:   regular BP sitting:   138 / 86  (left arm) Cuff size:   large  Vitals Entered By: [name] CMA (June 06, 2009 1:57 PM)  Physical Exam  General:  Well developed, well nourished, in no acute distress. Mouth:  Very poor dentition Neck:  Neck supple, no JVD. No masses, thyromegaly or abnormal cervical nodes. Lungs:  Somewhat distant breath sounds bilaterally.  Heart:  Non-displaced PMI, chest non-tender; regular rate and rhythm, S1, S2 without murmurs, rubs, +S4. Carotid upstroke normal, no bruit. I am unable to palpate pedal pulses.  The feet are cool.  There are no open ulcerations or sores.   Abdomen:  Bowel sounds positive; abdomen soft and non-tender without masses, organomegaly, or hernias noted. No hepatosplenomegaly. Extremities:  No clubbing or cyanosis. Neurologic:  Alert and oriented x 3. Psych:  Normal affect.   Impression & Recommendations:  Problem # 1:  CAD, NATIVE VESSEL (ICD-414.01) Patient had medically-managed MI in 6/09.  He had an episode of exertional chest pain about a year ago but has had no chest pain since.  He is very physically limited by his claudication and is not able to exert himself much.  Will continue medical management for now.  He will stay on Plavix, metoprolol, statin, and lisinopril.  He will start ASA 81 mg daily.   Problem # 2:  PVD WITH CLAUDICATION (ICD-443.89) Severe claudication with absent pedal pulses and cool feet.  No nonhealing ulcers.  Though he does not seem to have rest pain, he has pain with very minimal exertion.  I think that it will be imperative to get his legs revascularized as he is on the verge of limb-threatening lower extremity ischemia.  He had  significant bilateral disease by arterial dopplers in 2009.  At that time, it was recommended that he have an arteriogram.  I am going to send him back to Dr. Excell Seltzer for peripheral arteriogram.  He is amenable to this.  I will try to get him in ASAP.  He will continue Plavix and statin and will start aspirin.  He needs to quit smoking.   Problem # 3:  TOBACCO ABUSE (ICD-305.1) I would not be surprised if he does not have some COPD.  I strongly encouraged him to stop smoking and gave him a Chantix prescription .   Problem #  4:  HYPERLIPIDEMIA-MIXED (ICD-272.4) Check lipids/LFTs today.  I suspect that he will need a stronger statin.   Other Orders: EKG w/ Interpretation (93000) Misc. Referral (Misc. Ref)  Patient Instructions: 1)  Your physician has recommended you make the following change in your medication:  2)  Start Chantix to help you stop smoking 3)  Your physician recommends that you return for a FASTING lipid profile/liver profile/BMP   ASAP  414.01 440.21 4)  You have been referred to Dr Tonny Bollman  5)  Your physician recommends that you schedule a follow-up appointment in: 2 months with Dr. [name]  Prescriptions: CHANTIX CONTINUING MONTH PAK 1 MG TABS (VARENICLINE TARTRATE) take as directed  #1 x 1   Entered by:   Katina Dung, RN, BSN   Authorized by:   [name], MD   Signed by:   Katina Dung, RN, BSN on 06/06/2009   Method used:   Electronically to        [facility] (retail)       [address]       [place], [place]  [address]       Ph: [phone]       Fax: [phone]   RxID:   [id number] CHANTIX STARTING MONTH PAK 0.5 MG X 11 & 1 MG X 42 TABS (VARENICLINE TARTRATE) take as directed  #1 x 0   Entered by:   Katina Dung, RN, BSN   Authorized by:   [name], MD   Signed by:   Katina Dung, RN, BSN on 06/06/2009   Method used:   Electronically to        [facility] (retail)       [address]       [place], [place]  [address]       Ph: [phone]       Fax: [phone]   RxID:   [id number] PLAVIX 75 MG TABS (CLOPIDOGREL BISULFATE) take one tablet once daily  #30 x 11   Entered by:   Katina Dung, RN, BSN   Authorized by:   [name], MD   Signed by:   Katina Dung, RN, BSN on 06/06/2009   Method used:   Electronically to        [facility] (retail)       [address]       [place], [place]  [address]       Ph: [phone]       Fax: [phone]   RxID:   [id number]

## 2010-06-04 NOTE — Assessment & Plan Note (Signed)
Summary: rov   Visit Type:  Follow-up Referring Provider:  Eartha Inch Primary Provider:  None   History of Present Illness: 55 yo with history of CAD, severe PAD, and diabetes presents for followup.  He had NSTEMI in 6/09 that was treated medically as patient refused catheterization due to financial issues.  He also has severe claudication.  His right leg is now numb from the knee down and very weak.  He gets pain in the right foot and calf even with just standing up.  Any exertion causes severe pain in the legs bilaterally, right > left.  Small cuts take a long time to heal.  His feet feel cold.  No ulcerations.  He is using his cane to walk.  He still smokes a pack a day.  No chest pain.  No dyspnea but is not doing much walking due to claudication.   Since last appointment, patient had catheterization showing totally occluded circumflex artery with some collaterals as well as severe PAD (see PMH for description).  His best option for revascularization would be a fem-pop bypass.  Patient says that he recently had an appointment with Dr. Myra Gianotti.  They discussed fem-pop but he would like to try medical management for now.  He has been taking his medications and lipids are improved (though not ideal).  He has been seeing Dr. Everardo All for his diabetes and this now seems to be under good control with am fasting glucose around 80.   Labs (3/11): HDL 20 => 36, LDL 72, TGs 1610 => 448 Labs (2/11): K 4.4, creatinine 0.8   Current Medications (verified): 1)  Lisinopril 40 Mg Tabs (Lisinopril) .... Take One Tablet Once Daily 2)  Pravastatin Sodium 40 Mg Tabs (Pravastatin Sodium) .... Take 2 Tablets At Bedtime 3)  Metoprolol Tartrate 50 Mg Tabs (Metoprolol Tartrate) .... Take One Tablet Two Times A Day 4)  Plavix 75 Mg Tabs (Clopidogrel Bisulfate) .... Take One Tablet Once Daily 5)  Aspir-Low 81 Mg Tbec (Aspirin) .... One Daily 6)  Lovaza 1 Gm Caps (Omega-3-Acid Ethyl Esters) .... Take 2 Capsules  Twice A Day 7)  Metformin Hcl 500 Mg Xr24h-Tab (Metformin Hcl) .... 2 Tabs Bid 8)  Januvia 100 Mg Tabs (Sitagliptin Phosphate) .Marland Kitchen.. 1 Each Am 9)  Freestyle Lite Test  Strp (Glucose Blood) .... Two Times A Day, and Lancets 250.00 10)  Actos 45 Mg Tabs (Pioglitazone Hcl) .Marland Kitchen.. 1 Qd 11)  Glimepiride 4 Mg Tabs (Glimepiride) .... 1/2 Tab Each Am 12)  Aspirin Ec 325 Mg Tbec (Aspirin) .... As Needed For Pain.  Allergies (verified): No Known Drug Allergies  Past History:  Past Medical History: 1. Non-Hodgkin lymphoma.  This was treated about 10 years ago with radiation, it is now in remission.  He had it in his axillary area and inguinal areas. 2. Formerly morbidly obese, but lost 100 pounds by diet and exercise. 3. Hypertension. 4. Hypercholesterolemia. 5. History of alcohol abuse. 6. Current smoker.  He smokes a pack a day.  ? COPD.  7. Coronary artery disease.  The patient did have a non-ST-elevation MI in June 2009 that was treated medically.  The patient refused LHC at that time.  LHC done 2/11 showed totally occluded CFX with faint R->L collaterals, 40-50% proximal LAD, patent RCA.  Renal arteries were patent.  8. Echocardiogram done in June 2009 showed an EF of 60%, mild LVH, no regional wall motion abnormalities.  9. PAD.  He underwent ABIs and lower extremity duplex scanning on  February 18, 2008.  This demonstrated occlusion of the distal right superficial femoral artery with reconstitution at the level of the popliteal artery.  There appeared to be some degree of inflow disease because of biphasic common femoral waveforms.  The ABI was 0.49 on the right and 0.87 on the left.  Peripheral angiogram (2/11) showed 50% L CIA stenosis, 90% stenosis of L SFA and popliteal, totally occluded R SFA with reconstitution of popliteal.  Best option would be fem-pop bypass.  He has seen Dr. Myra Gianotti and wanted medical treatment for now.   Family History: Reviewed history from 06/27/2009 and no changes  required. The patient's mother had a myocardial infarction in her 7s.  He had 2 aunts with myocardial infarctions in their 23s.  His father had peripheral arterial disease.  No diabetes in his immediate family.  Social History: Reviewed history from 06/06/2009 and no changes required. The patient lives with his wife.  He is an unemployed Investment banker, operational, originally from [place].  He has no children.  He smokes 1 pack a day and he has done so for years.  He used to drink heavily but has quit.   Review of Systems       All systems reviewed and negative except as per HPI.   Vital Signs:  Patient profile:   56 year old male Height:      69.5 inches Weight:      204 pounds BMI:     29.80 Pulse rate:   76 / minute BP sitting:   110 / 70  (left arm)  Vitals Entered By: Laurance Flatten CMA (July 24, 2009 9:20 AM)  Physical Exam  General:  Well developed, well nourished, in no acute distress. Neck:  Neck supple, no JVD. No masses, thyromegaly or abnormal cervical nodes. Lungs:  Clear bilaterally to auscultation and percussion. Heart:  Non-displaced PMI, chest non-tender; regular rate and rhythm, S1, S2 without murmurs, rubs, +S4. Carotid upstroke normal, no bruit. I am unable to palpate pedal pulses.  The feet are cool.  There are no open ulcerations or sores.   Abdomen:  Bowel sounds positive; abdomen soft and non-tender without masses, organomegaly, or hernias noted. No hepatosplenomegaly. Extremities:  No clubbing or cyanosis. Neurologic:  Alert and oriented x 3. Psych:  Normal affect.   Impression & Recommendations:  Problem # 1:  PVD WITH CLAUDICATION (ICD-443.89) Severe PAD with significant claudication.  Not a good candidate for percutaneous revascularization.  No limb-threatening ischemia at this time.  He saw Dr. Myra Gianotti and has opted for medical treatment for now.  - Needs to stop smoking - Continue ASA, statin, Plavix - Start cilostazol 100 mg two times a day (patient has no  history of CHF and EF is preserved).  - Start walking program.  Will try to walk through the pain a little more each day.   Problem # 2:  TOBACCO ABUSE (ICD-305.1) We discussed smoking cessation.  He will start Chantix.   Problem # 3:  HYPERLIPIDEMIA-MIXED (ICD-272.4) Lipids improved with addition of Lovaza.  Repeat in 3 months.  If TGs still too high will consider addition of fenofibrate.  He is on pravastatin.   Problem # 4:  CAD, NATIVE VESSEL (ICD-414.01) History of prior MI with circumflex occlusion.  No chest pain.  Continue medical management with ACEI, Plavix, beta blocker, ASA, statin.   Patient Instructions: 1)  Your physician has recommended you make the following change in your medication:  2)  Start Pletal 100mg  twice  a day 3)  Your physician recommends that you return for a FASTING lipid profile/liver profile in 3 months 414.01  4)    5)  Your physician wants you to follow-up in: 6 months with Dr Shirlee Latch. You will receive a reminder letter in the mail two months in advance. If you don't receive a letter, please call our office to schedule the follow-up appointment. Prescriptions: PLETAL 100 MG TABS (CILOSTAZOL) one tablet twice a day  #60 x 6   Entered by:   Katina Dung, RN, BSN   Authorized by:   [name], MD   Signed by:   Katina Dung, RN, BSN on 07/24/2009   Method used:   Electronically to        [facility] (retail)       [address]       [place], [place]  [address]       Ph: [phone]       Fax: [phone]   RxID:   [id number]

## 2010-06-04 NOTE — Assessment & Plan Note (Signed)
Summary: 1 WK FU STC   Vital Signs:  Patient profile:   55 year old male Height:      69.5 inches (176.53 cm) Weight:      203.25 pounds (92.39 kg) O2 Sat:      93 % on Room air Temp:     98.1 degrees F (36.72 degrees C) oral Pulse rate:   77 / minute BP sitting:   104 / 68  (left arm) Cuff size:   large  Vitals Entered By: Josph Macho RMA (July 04, 2009 8:22 AM)  O2 Flow:  Room air CC: 1 week follow up/ CF Is Patient Diabetic? Yes   Referring Provider:  Eartha Inch Primary Provider:  None  CC:  1 week follow up/ CF.  History of Present Illness: he brings a record of his cbg's which i have reviewed today.  almost are 200's, with no pattern throughout the day.  he has some nausea, which he says is due to the metformin.    Current Medications (verified): 1)  Lisinopril 40 Mg Tabs (Lisinopril) .... Take One Tablet Once Daily 2)  Pravastatin Sodium 40 Mg Tabs (Pravastatin Sodium) .... Take 2 Tablets At Bedtime 3)  Metoprolol Tartrate 50 Mg Tabs (Metoprolol Tartrate) .... Take One Tablet Two Times A Day 4)  Plavix 75 Mg Tabs (Clopidogrel Bisulfate) .... Take One Tablet Once Daily 5)  Aspir-Low 81 Mg Tbec (Aspirin) .... One Daily 6)  Lovaza 1 Gm Caps (Omega-3-Acid Ethyl Esters) .... Take 2 Capsules Twice A Day 7)  Metformin Hcl 500 Mg Xr24h-Tab (Metformin Hcl) .... 2 Tabs Bid 8)  Januvia 100 Mg Tabs (Sitagliptin Phosphate) .Marland Kitchen.. 1 Each Am 9)  Freestyle Lite Test  Strp (Glucose Blood) .... Two Times A Day, and Lancets 250.00  Allergies (verified): No Known Drug Allergies  Past History:  Past Medical History: Last updated: 06/06/2009 1. Non-Hodgkin lymphoma.  This was treated about 10 years ago with radiation, it is now in remission.  He had it in his axillary area and inguinal areas. 2. Formerly morbidly obese, but lost 100 pounds by diet and exercise. 3. Hypertension. 4. Hypercholesterolemia. 5. History of alcohol abuse. 6. Current smoker.  He smokes a pack a day.   ? COPD.  7. Coronary artery disease.  The patient did have a non-ST-elevation MI in June 2009 that was treated medically.  The patient did refuse heart catheterization. 8. Echocardiogram done in June 2009 showed an EF of 60%, mild LVH, no regional wall motion abnormalities.  9. PAD.  He underwent ABIs and lower extremity duplex scanning on February 18, 2008.  This demonstrated occlusion of the distal right superficial femoral artery with reconstitution at the level of the popliteal artery.  There appeared to be some degree of inflow disease because of biphasic common femoral waveforms.  The ABI was 0.49 on the right and 0.87 on the left.   Review of Systems  The patient denies hypoglycemia.    Physical Exam  General:  normal appearance.   Extremities:  no edema   Impression & Recommendations:  Problem # 1:  DIAB W/O COMP TYPE II/UNS NOT STATED UNCNTRL (ICD-250.00) Assessment Improved  Medications Added to Medication List This Visit: 1)  Actos 45 Mg Tabs (Pioglitazone hcl) .Marland Kitchen.. 1 qd 2)  Glimepiride 4 Mg Tabs (Glimepiride) .Marland Kitchen.. 1 two times a day  Other Orders: Est. Patient Level III ([address])  Patient Instructions: 1)  check your blood sugar 2 times a day.  vary the time  of day when you check, between before the 3 meals, and at bedtime.  also check if you have symptoms of your blood sugar being too high or too low.  please keep a record of the readings and bring it to your next appointment here.  please call us sooner if you are having low blood sugar episodes. 2)  add actos 45 mg once daily, and glimepiride 4 mg two times a day. 3)  Please schedule a follow-up appointment in 1 month. Prescriptions: GLIMEPIRIDE 4 MG TABS (GLIMEPIRIDE) 1 two times a day  #60 x 11   Entered and Authorized by:   Minus Breeding MD   Signed by:   Minus Breeding MD on 07/04/2009   Method used:   Electronically to        [facility] (retail)       [address]       [place], [place]  [address]       Ph: [phone]       Fax: [phone]   RxID:   [id number] ACTOS 45 MG TABS (PIOGLITAZONE HCL) 1 qd  #30 x 11   Entered and Authorized by:   Minus Breeding MD   Signed by:   Minus Breeding MD on 07/04/2009   Method used:   Electronically to        [facility] (retail)       [address]       [place], [place]  [address]       Ph: [phone]       Fax: [phone]   RxID:   [id number]

## 2010-06-06 NOTE — Progress Notes (Signed)
  Phone Note Refill Request Message from:  Pharmacy  Refills Requested: Medication #1:  GLIMEPIRIDE 2 MG TABS one daily Initial call taken by: Ami Bullins CMA,  May 01, 2010 9:22 AM    Prescriptions: GLIMEPIRIDE 2 MG TABS (GLIMEPIRIDE) one daily  #90 x 1   Entered by:   Ami Bullins CMA   Authorized by:   Minus Breeding MD   Signed by:   Bill Salinas CMA on 05/01/2010   Method used:   Electronically to        [facility]       [address]       [place], [place]  [address]       Ph: [phone]       Fax: [phone]   RxID:   [id number]

## 2010-06-06 NOTE — Progress Notes (Signed)
  Phone Note Outgoing Call   Summary of Call: Pt. left message to please contact Dr.Mclean's office as they sent request for his diabetic med. here. Initial call taken by: Teryl Lucy RN,  April 30, 2010 3:30 PM

## 2010-07-24 ENCOUNTER — Other Ambulatory Visit: Payer: Self-pay | Admitting: Endocrinology

## 2010-07-24 DIAGNOSIS — E119 Type 2 diabetes mellitus without complications: Secondary | ICD-10-CM

## 2010-07-24 LAB — GLUCOSE, CAPILLARY: Glucose-Capillary: 272 mg/dL — ABNORMAL HIGH (ref 70–99)

## 2010-07-26 ENCOUNTER — Other Ambulatory Visit: Payer: Self-pay | Admitting: Endocrinology

## 2010-07-26 DIAGNOSIS — E119 Type 2 diabetes mellitus without complications: Secondary | ICD-10-CM

## 2010-09-17 NOTE — Procedures (Signed)
VASCULAR LAB EXAM   INDICATION:  Preop.   HISTORY:  Diabetes:  Yes  Cardiac:  Yes  Hypertension:  Yes   EXAM:  Duplex of the right greater saphenous vein.   IMPRESSION:  Right greater saphenous vein appears to be within normal  size for use as a conduit for bypass graft.   ___________________________________________  V. Charlena Cross, MD   MG/MEDQ  D:  07/16/2009  T:  07/17/2009  Job:  [id number]

## 2010-09-17 NOTE — H&P (Signed)
NAMEBENJAMIN, [name]:  [id number]   MEDICAL RECORD NO.:  [id number]          PATIENT TYPE:  INP   LOCATION:  2912                         FACILITY:  [facility]   PHYSICIAN:  Christell Faith, MD   DATE OF BIRTH:  1955-07-12   DATE OF ADMISSION:  10/12/2007  DATE OF DISCHARGE:                              HISTORY & PHYSICAL   CHIEF COMPLAINT:  Chest pressure.   HISTORY OF PRESENT ILLNESS:  This is a 55 year old white man with a past  medical history of non-Hodgkin lymphoma, but no known cardiac disease,  who developed 10/10 chest pressure today after working in his garden.  He has a history of mild intermittent exertional angina, but has never  experienced anything like this today.  Today was very intense substernal  associated with shortness of breath and diaphoresis.  He had a sense of  impending doom.  He presented to the [facility] Emergency Department  and received nitroglycerin and morphine, and his pain has reduced to  2/10 and upon transfer to the [facility] CCU, he says his pain has  basically gone with just a little soreness.  He does have a history of  morbid obesity and has lost over 100 pounds.  Prior to weight loss, he  had hypertension and hyperlipidemia, and he has smoked a pack a day of  cigarettes since age 65.   PAST MEDICAL HISTORY:  1. Non-Hodgkin lymphoma, presenting as adenopathy in the axilla and      inguinal areas, treated with radiation therapy, this has been in      remission for 10 years.  2. Former morbid obesity associated with hypertension and      hyperlipidemia, now resolved with greater than 100-pound weight      loss secondary to a structured dietary program.   ALLERGIES:  None.   MEDICATIONS:  Aspirin 81 mg p.o. daily.   SOCIAL HISTORY:  Lives in [place] with his wife.  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  He smokes one pack a day since age 55.  He  drinks alcohol regularly in moderation.  He grew up in the  [place] area.  He does not use drugs.   FAMILY HISTORY:  Mother died in her 46s of an MI and the patient has 2  maternal aunts with MIs in their 59s.  The patient's father's history is  significant for longevity and good health.   REVIEW OF SYSTEMS:  Positive for an active tooth abscess, chest pain,  and shortness of breath.  Otherwise, the balance of 14 systems is  reviewed and is negative.   PHYSICAL EXAMINATION:  VITAL SIGNS:  Temperature 98.6, pulse 93,  respiratory rate 18, blood pressure 152/82, and saturation 93% on room  air.  GENERAL:  This is a very pleasant white man in no acute distress.  HEENT:  Pupils are equal, round, and reactive.  Sclerae are clear.  Mucous membranes are moist.  Dentition is very poor with areas of  tenderness along the gum line.  NECK:  Supple.  No elevation of JVD.  No carotid bruits.  There is no  cervical adenopathy.  CARDIAC:  Normal rate, regular rhythm.  No murmurs or gallops.  Point of  maximal impulses in the normal location.  LUNGS:  Clear to auscultation bilaterally on posterior exam with no  wheezing or rales.  ABDOMEN:  Soft, nontender, and nondistended with no bruits.  EXTREMITIES:  No edema, 2+ dorsalis pedis pulses bilaterally and 2+  radial pulses bilaterally.  MUSCULOSKELETAL:  No joint effusions or tenderness.  NEUROLOGIC:  The patient is awake, alert, and oriented x3 with no overt  deficits noted, 5/5 strength in all 4 extremities.   DIAGNOSTIC TESTS:  Chest x-ray shows no active process.  First EKG shows  sinus rhythm, rate of 96 with downsloping inferolateral ST depressions.  EKG #2 shows sinus rhythm, rate of 82 with anterolateral ST depression  and deep T-wave inversions clearly consistent with ischemia.   LABORATORY DATA:  White blood cell 8.4, hemoglobin 16.7, and platelets  351.  Sodium 138, potassium 3.5, BUN 10, and creatinine 1.1.  Point-of-  care troponin,  1. 0.18.  2. 0.31.   IMPRESSION:  A 55 year old white  man with definite acute coronary  syndrome.   PLAN:  1. Admit to the coronary care unit, Dr. Diona Browner attending.  Continue      to rule out myocardial infarction by cycling serial EKGs and      cardiac enzymes.  Point-of-care troponin was abnormal, but not      diagnostic at this point.  2. Continue heparin and aspirin and add Integrilin for high-risk acute      coronary syndrome.  Plan for cardiac catheterization first thing in      the morning.  We will manage him medically over night, increase his      nitroglycerin to make him completely pain free, and use morphine as      needed.  3. Check fasting lipid panel and initiate empiric statin therapy.  4. Initiate beta blocker therapy for better heart rate and blood      pressure control in the setting of acute ischemia.  5. He needs strict tobacco cessation, counseling will be given.  6. He will need a dental consult for his abscessed tooth.  7. He will probably need financial support for Plavix, as both he and      his wife are currently unemployed.      Christell Faith, MD  Electronically Signed     NDL/MEDQ  D:  10/13/2007  T:  10/13/2007  Job:  [id number]

## 2010-09-17 NOTE — Progress Notes (Signed)
[facility]                        PERIPHERAL VASCULAR OFFICE NOTE   NAME:CAMPBELLBayani, [name]                        MRN:          [id number]  DATE:03/06/2008                            DOB:          11-19-1955    REASON FOR VISIT:  Right leg claudication.   HISTORY OF PRESENT ILLNESS:  [name] is a 54 year old gentleman  with a history of coronary artery disease and non-Hodgkin lymphoma.  He  was hospitalized in June with a non-ST-elevation MI and ultimately was  treated with medical therapy.  He opted not to undergo cardiac  catheterization because of financial considerations.  He has actually  done well with medical therapy and has had no further episodes of chest  pain.  However, he has developed severe right leg pain over the past  several months.  He describes an aching pain in his right calf with low-  level walking.  He has symptoms at 10 feet of walking.  He initially had  improved symptoms after he was started on Plavix for treatment of his  myocardial infarction.  However, this treatment effect has diminished  and he now has symptoms with minimal activity.  He also describes pain  in his right foot with standing or walking.  He denies rest pain.  He  has no nocturnal leg cramps.  He has not had ulcerations or nonhealing  wounds.  He denies pain in the thigh or buttocks.  He underwent ABIs and  lower extremity duplex scanning on February 18, 2008.  This demonstrated  occlusion of the distal right superficial femoral artery with  reconstitution at the level of the popliteal artery.  There appeared to  be some degree of inflow disease because of biphasic common femoral  waveforms.  The ABI was 0.49 on the right and 0.87 on the left.   MEDICATIONS:  1. Lisinopril 40 mg daily.  2. Lopressor 50 mg twice daily.  3. Plavix 75 mg daily.  4. Pravastatin 80 mg daily.   ALLERGIES:  NKDA.   PAST MEDICAL HISTORY:  1. Non-Hodgkin lymphoma,  radiotherapy 10 years ago.  The patient is in      remission.  2. Presumed coronary artery disease status post non-ST-elevation MI.  3. Hypertension.  4. Hypercholesterolemia.  5. History of alcohol abuse.  6. Ongoing tobacco use.  7. Preserved left ventricular function.   SOCIAL HISTORY:  The patient is married and lives locally.  He is an  unemployed Investment banker, operational.  He smokes 1 pack of cigarettes daily.   FAMILY HISTORY:  His father had peripheral arterial disease and  underwent lower extremity bypass surgery at a young age.   PHYSICAL EXAMINATION:  GENERAL:  The patient is alert and oriented.  He  is in no acute distress.  VITAL SIGNS:  Weight is 196 pounds, blood pressure is 134/84, heart rate  is 96, respiratory rate is 16.  HEENT:  Pertinent for very poor dentition, otherwise normal.  NECK:  JVP normal.  No carotid bruits.  No thyromegaly or thyroid  nodules.  LUNGS:  Clear bilaterally.  HEART:  Apex is discrete,  nondisplaced.  HEART:  Regular rate and rhythm.  No murmurs or gallops.  ABDOMEN:  Soft, nontender, no organomegaly.  No abdominal bruits.  BACK:  No CVA tenderness.  EXTREMITIES:  Femoral pulses are 2+ with a soft right femoral bruit.  The left popliteal, dorsalis pedis, and posterior tibial pulses are 2+  on the right.  The popliteal, dorsalis pedis, and posterior tibial  pulses are not palpable.  The feet are both warm.  There are no skin  ulcerations or rashes.  There is no clubbing, cyanosis, or edema on the  legs.  NEUROLOGIC:  Cranial nerves II through XII are intact.  Strength is  intact and equal bilaterally.   ASSESSMENT:  [name] is a 55 year old gentleman with severe  lifestyle-limiting right leg claudication.  Based on his duplex  findings, this appears to be secondary to distal superficial femoral  artery occlusion.  I do not think he has significant inflow disease  since he has strong bilateral femoral pulses.  I have recommended lower  extremity  angiography.  I think he clearly has an indication for  revascularization via either percutaneous methods or surgery.  However,  the patient has major financial concerns.  He is in the process of  working through this with [facility] Billing Department.  He would like  to defer any procedures until this is all sorted out.  I have advised  him on signs of critical limb ischemia and progressive vascular problems  such as rest pain, cold foot, weakness, numbness, or tingling of the  foot.  If he develops any of these symptoms, he was advised to go to the  emergency department.   He will continue on his current medical program.  We discussed Pletal,  but he was not inclined to try this medication after reading the side-  effect profile.  I think with his profound symptoms, it would be  unlikely that he would have a robust response to Pletal.   We will schedule followup visit in 2 months.  Hopefully we will hear  back sooner from him and we will plan on scheduling an angiography at  that point.     Randall Fells. Excell Seltzer, MD  Electronically Signed    MDC/MedQ  DD: 03/06/2008  DT: 03/07/2008  Job #: [id number]   cc:   [name], MD

## 2010-09-17 NOTE — Assessment & Plan Note (Signed)
[facility]                            CARDIOLOGY OFFICE NOTE   NAME:[name], [name]                        MRN:          [id number]  DATE:02/01/2008                            DOB:          06-28-1955    PRIMARY CARE PHYSICIAN:  The patient does not have a primary care  physician.   HISTORY OF PRESENT ILLNESS:  This is a 55 year old with history of  hypertension, hypercholesterolemia, and smoking, who had a recent non-ST-  elevation MI in June 2009 that was managed medically.  In June 2009, the  patient developed severe substernal chest pain while he was working in  his garden.  He came into [facility] Emergency Department and was found  to have a non-ST-elevation MI with peak troponin of 2.  Due to economic  issues, he did refuse left heart catheterization at that time and was  treated medically.  He was discharged to home several days later, pain-  free, on aspirin, Plavix, Lopressor, lisinopril, and Imdur as well as  Zocor.  He has actually done fairly well at home.  He states he has had  no further chest pain.  He has not been extremely active; however, he is  walking and climbing stairs and doing most of his activities of daily  living and he has had no chest pain in that setting.  He does not get  short of breath with exertion.  He was taking Zocor when he got home  from the hospital; however, he states that he became constipated and he  stopped taking the Zocor because of constipation.  Medications continue  to be difficult for him.  He is unemployed and his wife is unemployed.  He was not taking the Plavix for several weeks; however, he is back on  it again.  Also, he complains of pain mainly in his calf that has been  primarily with exertion.  He gets pain in his calf after walking a  flight of steps.  Since he has been back on his Plavix, he has had less  pain in his calf.  He was getting it with fairly minor exertion before  he started back  on his Plavix.  He does not have pain in his leg at rest  and he does not have any evidence of gangrene in his foot.   PAST MEDICAL HISTORY:  1. Non-Hodgkin lymphoma.  This was treated about 10 years ago with      radiation, it is now in remission.  He had it in his axillary area      and inguinal areas.  2. Formerly morbid obese, but lost 100 pounds by diet and exercise.  3. Hypertension.  4. Hypercholesterolemia.  5. History of alcohol abuse.  6. Current smoker.  He smokes a pack a day.  7. Coronary artery disease.  The patient did have a non-ST-elevation      MI in June 2009 that was treated medically.  The patient did refuse      heart catheterization.  8. Echocardiogram done in June 2009 showed an EF of 60%,  mild LVH, no      regional wall motion abnormalities.   MEDICATIONS:  1. Aspirin 81 mg daily.  2. Plavix 75 mg daily.  3. Lopressor 50 mg b.i.d.  4. Lisinopril 40 mg daily.  5. Imdur 30 mg daily.  6. The patient was on Zocor 40 mg daily; however, he did stop this      medication because of constipation.   SOCIAL HISTORY:  The patient lives with his wife.  He is an unemployed  Investment banker, operational.  He has no children.  He smokes 1 pack a day and he has done so  for years.   FAMILY HISTORY:  The patient's mother had a myocardial infarction in her  42s.  He had 2 aunts with myocardial infarctions in their 51s.  He had a  father who had peripheral arterial disease.   LABORATORY DATA:  Most recent labs were done in June 2009.  Creatinine  0.74, LDL 111, HDL 36, and triglycerides 811.  EKG today, normal sinus  rhythm, nonspecific lateral T-wave changes.   PHYSICAL EXAMINATION:  VITAL SIGNS:  Blood pressure is 120/80, heart  rate 85 and regular, weight is 195 pounds.  GENERAL:  This is a well-developed male in no apparent distress.  NEUROLOGIC:  Alert and oriented x3.  Normal affect.  NECK:  No JVD.  No thyromegaly.  No thyroid nodule.  LUNGS:  Clear to auscultation bilaterally with  normal respiratory  effort.  HEART:  Regular.  S1 and S2.  There is an S4.  No murmur.  No S3.  There  is no peripheral edema.  There is no carotid bruit.  I am unable to  palpate a pulse at the right posterior tibial or dorsalis pedis areas.  I am able to palpate a 1+ posterior tibial pulse on the left.  There is  no evidence of gangrene in the right foot.  ABDOMEN:  Soft and nontender.  No hepatosplenomegaly.  Normoactive bowel  sounds.  HEENT:  Poor dentition.  SKIN:  Normal exam.  MUSCULOSKELETAL:  Normal exam.   ASSESSMENT AND PLAN:  This is a 55 year old with history of coronary  artery disease status post non-ST-elevation myocardial infarction that  was treated medically as well as hypertension and hypercholesterolemia,  who presents Cardiology Clinic for followup.  1. Coronary artery disease.  The patient did have non-ST-elevation      myocardial infarction in June 2009.  His troponin went to a maximum      of 2.  He did refuse heart catheterization at that time due to      economic issues.  Since the time of his discharge, he has had no      further chest pain, though he has not been extremely active.  He is      on aspirin and Plavix, which he will continue.  He is also on an      angiotensin-converting enzyme inhibitor and beta-blocker.  He did      stop his statin due to constipation.  We did restart him back on      the statin again.  He does need to get his statin from the list at      [facility] or [facility].  He is not able to tolerate simvastatin.      Therefore, I may start him on pravastatin at 80 mg daily.  We will      get a lipid profile in 3 months to see what our control looks like.  Additionally, he did not have his coronaries assessed and did not      have revascularization in the hospital.  He does need a stress test      to assess his degree of ischemia.  We will do a symptom-limited      full Bruce protocol stress test.  We will do treadmill only as he       states that he does not want to do the nuclear stress test due to      the expense, so we will do a treadmill stress test.  We will go      ahead and set him up for that.  2. Claudication.  The patient does have claudication symptoms with      decreased pulses in his right leg.  I do think it is peripheral      arterial disease.  I am going to go ahead and set him up for      vascular studies.  We will do arterial Dopplers and ABIs to see if      we can isolate the level of potential blockage.  3. Hypercholesterolemia.  As reported, I am going to start the patient      on pravastatin 80, and will repeat his cholesterol in 3 months.  4. Hypertension.  The patient's blood pressure is under good control.     [name], MD  Electronically Signed    DM/MedQ  DD: 02/01/2008  DT: 02/02/2008  Job #: [id number]   cc:   Arturo Morton. Riley Kill, MD, [facility]

## 2010-09-17 NOTE — Discharge Summary (Signed)
[name], [name]:  [id number]   MEDICAL RECORD NO.:  [id number]          PATIENT TYPE:  INP   LOCATION:  2926                         FACILITY:  [facility]   PHYSICIAN:  Arturo Morton. Riley Kill, MD, FACCDATE OF BIRTH:  [birth year]   DATE OF ADMISSION:  10/12/2007  DATE OF DISCHARGE:  10/15/2007                               DISCHARGE SUMMARY   The patient does not have primary care .   DISCHARGE DIAGNOSIS:  Acute non-ST segment elevation myocardial  infarction.   SECONDARY DIAGNOSES:  1. Hypertension.  2. Hyperlipidemia.  3. Hyperglycemia without prior diagnosis of diabetes.  4. Ongoing tobacco abuse.  5. Ongoing EtOH abuse.  6. History of non-Hodgkin lymphoma previously treated with radiation      in remission x10 years.  7. History of morbid obesity with greater than 100-pound weight loss      with dieting.   ALLERGIES:  No known drug allergies.   PROCEDURE:  2D-echocardiogram.   HISTORY OF PRESENT ILLNESS:  A 55 year old Caucasian male with prior  history of coronary artery disease who was in his usual state of health  until October 13, 2007, when he was out working in his garden and developed  sudden onset of 10/10 substernal chest pressure associated with  shortness of breath and diaphoresis.  He eventually presented to [facility] where he was treated with supplemental nitroglycerin with  some relief of discomfort.  In the ED, he was noted to have  inferolateral ST segment depression and his cardiac markers were  abnormal with troponin of 0.31.  He was transferred to [facility] for  admission and management of acute non-ST elevation MI.   HOSPITAL COURSE:  The patient eventually peaked his CK at 151, MB at  14.3, and troponin I of 2.04.  We recommended diagnostic retrocardiac  catheterization to evaluate coronary anatomy, however, the patient  refused.  As a result, we continued medical therapy including aspirin,  beta-blocker, ACE  inhibitor, statin, Plavix, and nitrate therapy.  The  patient was also maintained on intravenous heparin.  A 2D-echocardiogram  was performed on October 13, 2007, and this showed an EF of 60% without  regional wall motion abnormalities and mild LVH.  The patient was  transferred to the step down and has been ambulating without recurrence  of symptoms or limitations.  He has been counseled on the importance of  tobacco and alcohol cessation and he says he will think about it.  He  has been noted to have fasting sugars on his blood chemistries in the  130s to 140s.  He has no prior history of diabetes.  He is not  interested in initiating therapy at this time.  We have recommended  outpatient followup with the primary care .  The patient is  being discharged home today in good condition.   DISCHARGE LABS:  Hemoglobin 14.3, hematocrit 39.8, WBC 7.7, platelets  159, and MCV 96.5.  Sodium 138, potassium 3.6, chloride 101, CO2 26, BUN  9, creatinine 0.74, and glucose 130.  Total bilirubin 1.1, alkaline  phosphatase 69, AST 34,  ALT 11, albumin 3.3, CK 65, MB 2.1, and troponin  I is 1.04.  Total cholesterol 212, triglycerides 324, HDL 36, LDL 111,  calcium 8.6, magnesium 129, BNP was 80.0, and TSH 0.957.   DISPOSITION:  The patient is being discharged home today in good  condition.   FOLLOWUP PLANS AND APPOINTMENTS:  We have arranged for followup with Dr.  Diona Browner on November 01, 2007, at 2:45 p.m.  We have recommended he obtain a  primary care .   DISCHARGE MEDICATIONS:  1. Aspirin 325 mg daily.  2. Plavix 75 mg daily.  3. Lopressor 50 mg b.i.d.  4. Lisinopril 40 mg daily.  5. Imdur 30 mg daily.  6. Simvastatin 40 mg nightly.  7. Nitroglycerin 0.4 mg sublingual p.r.n. chest pain.   OUTSTANDING LABORATORY STUDIES:  None.   DURATION OF DISCHARGE/ENCOUNTER:  45 minutes including physician time.      Nicolasa Ducking, ANP      Arturo Morton. Riley Kill, MD, [facility]   Electronically Signed    CB/MEDQ  D:  10/15/2007  T:  10/16/2007  Job:  [id number]

## 2010-09-17 NOTE — Assessment & Plan Note (Signed)
OFFICE VISIT   [name], [name]  DOB:  06-07-1955                                       07/16/2009  NUUVO#:[id number]   REASON FOR VISIT:  Bilateral claudication, right greater than left.   HISTORY:  Patient is a 55 year old gentleman that I am seeing at the  request of Dr. Shirlee Latch and Dr. Excell Seltzer for evaluation of right leg  claudication.  The patient had a non-ST elevated MI last year but  refused cardiac catheterization secondary to financial concerns.  He has  done well from a cardiac perspective on medical therapy.  He recently  was scheduled to undergo an arteriogram by Dr. Excell Seltzer to evaluate his  lifestyle-limiting claudication.  This revealed distal superficial  femoral and popliteal occlusion with reconstitution at the knee.  He was  sent to me for possible surgical intervention.   Patient was recently diagnosed with diabetes.  His sugars are now under  good control, he says in the 90s in the morning.  He also suffers from  hypertension and hypercholesterolemia, which are medically managed.  He  has a history of a heart attack in 2009.  He has been stable from a  heart perspective.   With regards to his legs, he has severely limiting symptoms.  He can  hardly walk at all.  He does endorse occasional rest pain.  He does not  have any open sores.   REVIEW OF SYSTEMS:  Vascular is positive for pain in his legs with  walking.  All other review systems are negative, as documented in the  encounter form.   PAST MEDICAL HISTORY:  Diabetes.  Age of onset is 41.  Hypertension,  hypercholesterolemia, history of a heart attack.   FAMILY HISTORY:  Positive for cardiovascular disease at an early age in  his mother and his father.   SOCIAL HISTORY:  He is married with no children.  He is a retired Investment banker, operational.  He currently smokes 1 pack of cigarettes a day.   ALLERGIES:  None.   MEDICATIONS:  Include Plavix.   PHYSICAL EXAMINATION:  Heart rate is 74, blood  pressure 126/85,  temperature is 98.0.  general:  He is well-appearing in no distress.  HEENT:  Within normal limits.  Lungs are clear bilaterally.  Cardiovascular:  Regular rate and rhythm.  Abdomen is soft.  Musculoskeletal is without major deformities.  Neuro:  He has no focal  weaknesses.  Skin is without rash.   Ankle brachial indices were performed today.  This reveals 0.5 on the  right and 0.66 on the left.   Vein mapping was performed today.  He has an adequate right greater  saphenous vein with all measurements above 3.5 mm.   ASSESSMENT:  Severe right leg claudication.   PLAN:  I have discussed proceeding with a right femoral to below knee  popliteal bypass graft with vein.  I detailed the risks of the procedure  and the long-term patencies.  At this point, the patient is somewhat  reluctant to proceed with the operation, as he feels that his symptoms  have been stable over time, and he is concerned that he may lose options  should his bypass graft fail down the road.  He also continues to be a  smoker.  He is going to try to quit smoking prior to proceeding with  intervention.  He understands that his symptoms will likely progress and  that if he develops a nonhealing wound, especially given the fact that  he is diabetic, this could potentially lead to limb loss.  He is  scheduled to see Dr. [name] next week.  We will need to confirm  that he can be off Plavix for 5 days prior to his operation.  The  patient will call our office to schedule his surgery if he wishes to  proceed.     Jorge Ny, MD  Electronically Signed   VWB/MEDQ  D:  07/16/2009  Marshall:  07/17/2009  Job:  [id number]   cc:   [name], MD  Veverly Fells. Excell Seltzer, MD

## 2010-09-18 ENCOUNTER — Telehealth: Payer: Self-pay | Admitting: Cardiology

## 2010-09-18 ENCOUNTER — Other Ambulatory Visit: Payer: Self-pay | Admitting: *Deleted

## 2010-09-18 DIAGNOSIS — E119 Type 2 diabetes mellitus without complications: Secondary | ICD-10-CM

## 2010-09-18 MED ORDER — GLIMEPIRIDE 2 MG PO TABS: ORAL_TABLET | ORAL | Status: DC

## 2010-09-18 MED ORDER — METFORMIN HCL ER 500 MG PO TB24: ORAL_TABLET | ORAL | Status: DC

## 2010-09-18 NOTE — Telephone Encounter (Signed)
I talked with pt. Pt requesting refills of glimepiride 2mg  daily and metformin ER 500mg  two bid. Pt states he does not have $$ right now to schedule OV for diabetes follow-up. Pt states he plans to see Dr Yetta Barre for his diabetes management and has already discussed this with Dr Yetta Barre. Pt states he should be able to make an appointment with Dr Yetta Barre in the next month.

## 2010-09-18 NOTE — Telephone Encounter (Signed)
Pt calling re med, has question

## 2010-12-13 ENCOUNTER — Other Ambulatory Visit: Payer: Self-pay | Admitting: Cardiology

## 2010-12-25 ENCOUNTER — Other Ambulatory Visit: Payer: Self-pay | Admitting: Cardiology

## 2010-12-28 ENCOUNTER — Other Ambulatory Visit: Payer: Self-pay | Admitting: Endocrinology

## 2011-01-30 LAB — CBC
HCT: 39.8
HCT: 48
Hemoglobin: 13.8
MCV: 95.1
MCV: 96.5
Platelets: 159
Platelets: 204
Platelets: 251
RBC: 4.06 — ABNORMAL LOW
RDW: 14.6
RDW: 14.6
RDW: 14.9
WBC: 7.7
WBC: 8.4

## 2011-01-30 LAB — BASIC METABOLIC PANEL
Chloride: 101
Creatinine, Ser: 0.74
GFR calc Af Amer: >60
GFR calc non Af Amer: >60
Potassium: 3.6

## 2011-01-30 LAB — COMPREHENSIVE METABOLIC PANEL
AST: 34
Albumin: 3.3 — ABNORMAL LOW
Calcium: 8.5
Creatinine, Ser: 0.96
GFR calc non Af Amer: >60
Potassium: 4.4
Sodium: 141
Total Bilirubin: 1.1

## 2011-01-30 LAB — CARDIAC PANEL(CRET KIN+CKTOT+MB+TROPI)
CK, MB: 12.6 — ABNORMAL HIGH
CK, MB: 13.7 — ABNORMAL HIGH
Relative Index: 8.3 — ABNORMAL HIGH
Relative Index: INVALID
Total CK: 151
Total CK: 204
Troponin I: 1.04
Troponin I: 1.32
Troponin I: 2.02

## 2011-01-30 LAB — POCT I-STAT, CHEM 8
BUN: 10
Calcium, Ion: 1.08 — ABNORMAL LOW
Creatinine, Ser: 1.1
TCO2: 24

## 2011-01-30 LAB — PROTIME-INR
INR: 0.9
Prothrombin Time: 12.2

## 2011-01-30 LAB — LIPID PANEL
Cholesterol: 212 — ABNORMAL HIGH
HDL: 36 — ABNORMAL LOW
Total CHOL/HDL Ratio: 5.9
Triglycerides: 324 — ABNORMAL HIGH
VLDL: 65 — ABNORMAL HIGH

## 2011-01-30 LAB — MAGNESIUM: Magnesium: 1.9

## 2011-01-30 LAB — POCT CARDIAC MARKERS: Myoglobin, poc: 64

## 2011-01-30 LAB — CK TOTAL AND CKMB (NOT AT ARMC)
CK, MB: 6.5 — ABNORMAL HIGH
Relative Index: 6 — ABNORMAL HIGH

## 2011-01-30 LAB — HEPARIN LEVEL (UNFRACTIONATED)
Heparin Unfractionated: 0.33
Heparin Unfractionated: 0.36
Heparin Unfractionated: 0.61

## 2011-01-30 LAB — TROPONIN I: Troponin I: 0.31 — ABNORMAL HIGH

## 2011-01-31 ENCOUNTER — Other Ambulatory Visit: Payer: Self-pay | Admitting: Cardiology

## 2011-01-31 ENCOUNTER — Other Ambulatory Visit: Payer: Self-pay | Admitting: Endocrinology

## 2011-02-17 LAB — COMPREHENSIVE METABOLIC PANEL
ALT: 116 — ABNORMAL HIGH
AST: 190 — ABNORMAL HIGH
Albumin: 4.4
CO2: 20
Calcium: 9.1
Creatinine, Ser: 0.83
GFR calc Af Amer: >60
GFR calc non Af Amer: >60
Sodium: 137
Total Protein: 7.4

## 2011-02-17 LAB — CBC
MCHC: 34.2
MCV: 105 — ABNORMAL HIGH
Platelets: 104 — ABNORMAL LOW
RBC: 4.76
RDW: 14.6 — ABNORMAL HIGH

## 2011-02-17 LAB — DIFFERENTIAL
Eosinophils Absolute: 0
Eosinophils Relative: 0
Lymphocytes Relative: 9 — ABNORMAL LOW
Lymphs Abs: 0.9
Monocytes Absolute: 0.6
Monocytes Relative: 7

## 2011-02-17 LAB — LIPASE, BLOOD: Lipase: 35

## 2011-03-02 ENCOUNTER — Other Ambulatory Visit: Payer: Self-pay | Admitting: Endocrinology

## 2011-04-05 ENCOUNTER — Other Ambulatory Visit: Payer: Self-pay | Admitting: Endocrinology

## 2011-04-17 ENCOUNTER — Other Ambulatory Visit: Payer: Self-pay | Admitting: *Deleted

## 2011-04-17 MED ORDER — CILOSTAZOL 100 MG PO TABS: 100.0000 mg | ORAL_TABLET | Freq: Two times a day (BID) | ORAL | Status: DC

## 2011-05-03 ENCOUNTER — Other Ambulatory Visit: Payer: Self-pay | Admitting: Endocrinology

## 2011-05-07 ENCOUNTER — Other Ambulatory Visit: Payer: Self-pay | Admitting: *Deleted

## 2011-05-07 MED ORDER — GLIMEPIRIDE 2 MG PO TABS: ORAL_TABLET | ORAL | Status: DC

## 2011-05-07 NOTE — Telephone Encounter (Signed)
Pt called requesting a refill of Glimepiride. He has a upcoming new pt appointment with Dr. Debby Bud and is requesting a refill until his appointment. Rx sent to pharmacy, pt informed.

## 2011-05-08 ENCOUNTER — Ambulatory Visit: Payer: Self-pay | Admitting: Internal Medicine

## 2011-05-27 ENCOUNTER — Ambulatory Visit: Payer: Self-pay | Admitting: Internal Medicine

## 2011-06-02 ENCOUNTER — Other Ambulatory Visit: Payer: Self-pay | Admitting: Endocrinology

## 2011-06-10 ENCOUNTER — Ambulatory Visit: Payer: Self-pay | Admitting: Internal Medicine

## 2011-06-30 ENCOUNTER — Other Ambulatory Visit: Payer: Self-pay | Admitting: Endocrinology

## 2011-07-09 ENCOUNTER — Telehealth: Payer: Self-pay | Admitting: Cardiology

## 2011-07-09 NOTE — Telephone Encounter (Signed)
Patient called wanting Dr.Mclean to recommend a PCP.Patient was told any [facility] PCP.Patient still wants to check with Dr.Mclean.

## 2011-07-09 NOTE — Telephone Encounter (Signed)
Please return call to patient at hm# [phone]  Patient has medical care questions.  Please return call to patient at hm#

## 2011-07-10 NOTE — Telephone Encounter (Signed)
Talked with pt. Pt has seen Dr Everardo All in the past for diabetes. Pt is requesting a referral to another MD for management of diabetes and primary care. Pt did not want to see a MD at the [facility] office since his wife sees Dr Yetta Barre there.  I have given pt Dr Diamantina Monks name and phone number.

## 2011-07-23 ENCOUNTER — Other Ambulatory Visit: Payer: Self-pay | Admitting: Endocrinology

## 2011-07-31 ENCOUNTER — Other Ambulatory Visit: Payer: Self-pay | Admitting: *Deleted

## 2011-07-31 MED ORDER — METFORMIN HCL ER 500 MG PO TB24: ORAL_TABLET | ORAL | Status: DC

## 2011-07-31 MED ORDER — GLIMEPIRIDE 2 MG PO TABS: ORAL_TABLET | ORAL | Status: DC

## 2011-07-31 NOTE — Telephone Encounter (Signed)
Pt states that his insurance will not start for another two months and he has an appointment at that time, pt is requesting a refill of his Metformin and Glimepiride to go to [facility] on [address] until then.

## 2011-08-21 ENCOUNTER — Other Ambulatory Visit: Payer: Self-pay | Admitting: *Deleted

## 2011-08-21 MED ORDER — LISINOPRIL 20 MG PO TABS: 40.0000 mg | ORAL_TABLET | Freq: Every day | ORAL | Status: DC

## 2011-10-06 ENCOUNTER — Encounter: Payer: Self-pay | Admitting: *Deleted

## 2011-10-06 ENCOUNTER — Other Ambulatory Visit: Payer: Self-pay | Admitting: *Deleted

## 2011-10-06 MED ORDER — METFORMIN HCL ER 500 MG PO TB24: ORAL_TABLET | ORAL | Status: DC

## 2011-10-06 MED ORDER — GLIMEPIRIDE 2 MG PO TABS: ORAL_TABLET | ORAL | Status: DC

## 2011-10-06 NOTE — Telephone Encounter (Signed)
R"cd fax from [facility] for refill of Metformin and Glimepiride.

## 2011-10-13 ENCOUNTER — Ambulatory Visit (INDEPENDENT_AMBULATORY_CARE_PROVIDER_SITE_OTHER): Payer: 59 | Admitting: Cardiology

## 2011-10-13 ENCOUNTER — Encounter: Payer: Self-pay | Admitting: Cardiology

## 2011-10-13 VITALS — BP 110/86 | HR 75 | Ht 69.0 in | Wt 195.8 lb

## 2011-10-13 DIAGNOSIS — E785 Hyperlipidemia, unspecified: Secondary | ICD-10-CM

## 2011-10-13 DIAGNOSIS — I739 Peripheral vascular disease, unspecified: Secondary | ICD-10-CM

## 2011-10-13 DIAGNOSIS — I7389 Other specified peripheral vascular diseases: Secondary | ICD-10-CM

## 2011-10-13 DIAGNOSIS — F17200 Nicotine dependence, unspecified, uncomplicated: Secondary | ICD-10-CM

## 2011-10-13 DIAGNOSIS — I2510 Atherosclerotic heart disease of native coronary artery without angina pectoris: Secondary | ICD-10-CM

## 2011-10-13 DIAGNOSIS — K029 Dental caries, unspecified: Secondary | ICD-10-CM

## 2011-10-13 LAB — CBC WITH DIFFERENTIAL/PLATELET
Basophils Relative: 0.5 % (ref 0.0–3.0)
Eosinophils Relative: 3.9 % (ref 0.0–5.0)
Hemoglobin: 16.6 g/dL (ref 13.0–17.0)
Lymphocytes Relative: 25.5 % (ref 12.0–46.0)
MCV: 98.2 fl (ref 78.0–100.0)
Monocytes Absolute: 0.9 10*3/uL (ref 0.1–1.0)
Neutrophils Relative %: 62.4 % (ref 43.0–77.0)
RBC: 4.95 Mil/uL (ref 4.22–5.81)
WBC: 11.3 10*3/uL — ABNORMAL HIGH (ref 4.5–10.5)

## 2011-10-13 LAB — HEPATIC FUNCTION PANEL
Albumin: 4.2 g/dL (ref 3.5–5.2)
Alkaline Phosphatase: 60 U/L (ref 39–117)
Total Protein: 7.2 g/dL (ref 6.0–8.3)

## 2011-10-13 LAB — LIPID PANEL
Cholesterol: 190 mg/dL (ref 0–200)
HDL: 32.9 mg/dL — ABNORMAL LOW (ref >39.00)
Triglycerides: 539 mg/dL — ABNORMAL HIGH (ref 0.0–149.0)

## 2011-10-13 LAB — BASIC METABOLIC PANEL
Chloride: 98 mEq/L (ref 96–112)
Creatinine, Ser: 1.2 mg/dL (ref 0.4–1.5)
Sodium: 134 mEq/L — ABNORMAL LOW (ref 135–145)

## 2011-10-13 MED ORDER — LISINOPRIL 40 MG PO TABS: 40.0000 mg | ORAL_TABLET | Freq: Every day | ORAL | Status: DC

## 2011-10-13 MED ORDER — CILOSTAZOL 100 MG PO TABS: 100.0000 mg | ORAL_TABLET | Freq: Two times a day (BID) | ORAL | Status: DC

## 2011-10-13 MED ORDER — CLOPIDOGREL BISULFATE 75 MG PO TABS: 75.0000 mg | ORAL_TABLET | Freq: Every day | ORAL | Status: DC

## 2011-10-13 MED ORDER — PRAVASTATIN SODIUM 80 MG PO TABS: 80.0000 mg | ORAL_TABLET | Freq: Every evening | ORAL | Status: DC

## 2011-10-13 MED ORDER — METOPROLOL TARTRATE 50 MG PO TABS: 50.0000 mg | ORAL_TABLET | Freq: Two times a day (BID) | ORAL | Status: DC

## 2011-10-13 NOTE — Assessment & Plan Note (Signed)
Known severe PAD (see PMH).  Symptoms have worsened, now he has rest pain at times.  He needs to stop smoking.  He is very limited by claudication.  I am going to send him back to Dr. Myra Gianotti for reassessment.  He is willing to consider surgery.  He will continue cilostazol and I will restart Plavix.

## 2011-10-13 NOTE — Patient Instructions (Signed)
You have been referred to Dr Inocencio Homes at [facility].  You have been referred to Dr Myra Gianotti vascular surgeon--evaluate for possible surgery on your legs.   Your physician recommends that you return for a FASTING lipid profile /liver profile/CBCd/BMET today  Decrease aspirin to 81mg  daily.  Start plavix 75mg  daily.  Your physician recommends that you schedule a follow-up appointment in: 4 months with Dr Shirlee Latch.

## 2011-10-13 NOTE — Assessment & Plan Note (Addendum)
Goal LDL < 70.  Check lipids/LFTs today.  He had very high triglycerides in the past also and is no longer on Lovaza due to cost.  If extremely high TGs, would use generic fenofibrate.

## 2011-10-13 NOTE — Assessment & Plan Note (Signed)
I counselled him again to quit.  He plans to stop with his wife in August.

## 2011-10-13 NOTE — Assessment & Plan Note (Signed)
Known CAD with no recent chest pain.  Continue ASA but decrease to 81 mg daily.  I am going to have him restart Plavix (mainly because of PAD).  He will continue ACEI, beta blocker, and statin.

## 2011-10-13 NOTE — Progress Notes (Signed)
PCP: Dr. Everardo All  56 yo with history of CAD, severe PAD, and diabetes presents for followup. He had NSTEMI in 6/09 that was treated medically as patient refused catheterization due to financial issues. He also has severe claudication. Patient had catheterization in 2/11 showing totally occluded circumflex artery with some collaterals as well as severe PAD (see PMH for description). His best option for revascularization would be a fem-pop bypass. Patient saw vascular surgery at that time but decided against bypass procedure.  Patient denies any recent chest pain.  Main complaint is leg/foot pain.  He continues to smoke 1 ppd.  He has right foot and calf pain at rest at times.  He does not have any ulcers on his feet.  He develops calf pain with < 10 feet walking.  This has worsened since I last saw him.  He is not taking Plavix because of expense.  He is still on his cilostazol and ASA.  He denies exertional dyspnea but is not very active.  He is on antibiotics currently that he got at Urgent Care for tooth abscesses. He needs several teeth pulled.   ECG: NSR, normal.  Labs (3/11): HDL 20 => 36, LDL 72, TGs 0865 => 448  Labs (2/11): K 4.4, creatinine 0.8   Allergies (verified):  No Known Drug Allergies   Past Medical History:  1. Non-Hodgkin lymphoma. This was treated about 10 years ago with radiation, it is now in remission. He had it in his axillary area and inguinal areas.  2. Formerly morbidly obese, but lost 100 pounds by diet and exercise.  3. Hypertension.  4. Hypercholesterolemia.  5. History of alcohol abuse.  6. Current smoker. He smokes a pack a day. ? COPD.  7. Coronary artery disease. The patient did have a non-ST-elevation MI in June 2009 that was treated medically. The patient refused LHC at that time. LHC done 2/11 showed totally occluded CFX with faint R->L collaterals, 40-50% proximal LAD, patent RCA. Renal arteries were patent.  8. Echocardiogram done in June 2009 showed an EF  of 60%, mild LVH, no regional wall motion abnormalities.  9. PAD. He underwent ABIs and lower extremity duplex scanning on February 18, 2008. This demonstrated occlusion of the distal right superficial femoral artery with reconstitution at the level of the popliteal artery. There appeared to be some degree of inflow disease because of biphasic common femoral waveforms. The ABI was 0.49 on the right and 0.87 on the left. Peripheral angiogram (2/11) showed 50% L CIA stenosis, 90% stenosis of L SFA and popliteal, totally occluded R SFA with reconstitution of popliteal. Best option would be fem-pop bypass but patient opted for medical treatment in 2011.  10. Type II diabetes: Peripheral edema with Actos.   Family History:  The patient's mother had a myocardial infarction in her 42s. He had 2 aunts with myocardial infarctions in their 24s. His father had peripheral arterial disease. No diabetes in his immediate family.   Social History:  The patient lives with his wife. He is an unemployed Investment banker, operational, originally from [place]. He has no children. He smokes 1 pack a day and he has done so for years. He used to drink heavily but has quit. He is on disability.   Review of Systems  All systems reviewed and negative except as per HPI.   Current Outpatient Prescriptions  Medication Sig Dispense Refill  . amoxicillin (AMOXIL) 500 MG tablet Take 500 mg by mouth 3 (three) times daily.      Marland Kitchen  cilostazol (PLETAL) 100 MG tablet Take 1 tablet (100 mg total) by mouth 2 (two) times daily.  180 tablet  3  . Diphenhydramine-Acetaminophen (PERCOGESIC PO) Take by mouth as needed.      Marland Kitchen glimepiride (AMARYL) 2 MG tablet TAKE ONE TABLET BY MOUTH EVERY DAY  90 tablet  0  . glimepiride (AMARYL) 2 MG tablet Take 2 mg by mouth 2 (two) times daily.      . metFORMIN (GLUCOPHAGE-XR) 500 MG 24 hr tablet TAKE TWO TABLETS BY MOUTH TWICE DAILY  360 tablet  0  . metoprolol (LOPRESSOR) 50 MG tablet Take 1 tablet (50 mg total) by mouth  2 (two) times daily.  180 tablet  3  . DISCONTD: aspirin 325 MG tablet Take 325 mg by mouth.      . DISCONTD: metoprolol (LOPRESSOR) 50 MG tablet TAKE ONE TABLET BY MOUTH TWICE DAILY  60 tablet  6  . DISCONTD: pravastatin (PRAVACHOL) 40 MG tablet TAKE TWO TABLETS BY MOUTH EVERY DAY AT BEDTIME  60 tablet  12  . aspirin EC 81 MG tablet Take 1 tablet (81 mg total) by mouth daily.      . clopidogrel (PLAVIX) 75 MG tablet Take 1 tablet (75 mg total) by mouth daily.  90 tablet  3  . lisinopril (PRINIVIL,ZESTRIL) 40 MG tablet Take 1 tablet (40 mg total) by mouth daily.  90 tablet  3  . omega-3 acid ethyl esters (LOVAZA) 1 G capsule Take 2 g by mouth 2 (two) times daily.      . pravastatin (PRAVACHOL) 80 MG tablet Take 1 tablet (80 mg total) by mouth every evening.  90 tablet  3  . DISCONTD: aspirin 81 MG tablet Take 81 mg by mouth daily.      Marland Kitchen DISCONTD: clopidogrel (PLAVIX) 75 MG tablet Take 75 mg by mouth daily.      Marland Kitchen DISCONTD: glimepiride (AMARYL) 2 MG tablet TAKE ONE TABLET BY MOUTH EVERY DAY  90 tablet  0    BP 110/86  Pulse 75  Ht 5\' 9"  (1.753 m)  Wt 88.814 kg (195 lb 12.8 oz)  BMI 28.91 kg/m2 General: NAD Neck: No JVD, no thyromegaly or thyroid nodule.  Lungs: Clear to auscultation bilaterally with normal respiratory effort. CV: Nondisplaced PMI.  Heart regular S1/S2, no S3/S4, no murmur.  No peripheral edema.  No carotid bruit.  Unable to palpate DP or PT pulses on either leg. Abdomen: Soft, nontender, no hepatosplenomegaly, no distention.   Neurologic: Alert and oriented x 3.  Psych: Normal affect. Extremities: No clubbing or cyanosis.

## 2011-10-14 ENCOUNTER — Telehealth: Payer: Self-pay | Admitting: *Deleted

## 2011-10-14 ENCOUNTER — Other Ambulatory Visit: Payer: Self-pay | Admitting: *Deleted

## 2011-10-14 ENCOUNTER — Other Ambulatory Visit: Payer: Self-pay

## 2011-10-14 DIAGNOSIS — I739 Peripheral vascular disease, unspecified: Secondary | ICD-10-CM

## 2011-10-14 DIAGNOSIS — E785 Hyperlipidemia, unspecified: Secondary | ICD-10-CM

## 2011-10-14 DIAGNOSIS — I2510 Atherosclerotic heart disease of native coronary artery without angina pectoris: Secondary | ICD-10-CM

## 2011-10-14 MED ORDER — ATORVASTATIN CALCIUM 20 MG PO TABS: 20.0000 mg | ORAL_TABLET | Freq: Every day | ORAL | Status: DC

## 2011-10-14 MED ORDER — FENOFIBRATE 145 MG PO TABS: 145.0000 mg | ORAL_TABLET | Freq: Every day | ORAL | Status: DC

## 2011-10-14 NOTE — Telephone Encounter (Signed)
10-17-11 @ 10:00 with Dr. Kristin Bruins- dental 10-11-11 @ 1pm with Dr. Myra Gianotti  10-17-11 @ 10:00 with Dr. Kristin Bruins- dental 10-11-11 @ 1pm with Dr. Myra Gianotti

## 2011-10-16 ENCOUNTER — Encounter: Payer: Self-pay | Admitting: Endocrinology

## 2011-10-16 ENCOUNTER — Ambulatory Visit (INDEPENDENT_AMBULATORY_CARE_PROVIDER_SITE_OTHER): Payer: 59 | Admitting: Endocrinology

## 2011-10-16 ENCOUNTER — Other Ambulatory Visit (INDEPENDENT_AMBULATORY_CARE_PROVIDER_SITE_OTHER): Payer: 59

## 2011-10-16 VITALS — BP 122/82 | HR 84 | Temp 97.4°F | Ht 69.5 in | Wt 199.0 lb

## 2011-10-16 DIAGNOSIS — E119 Type 2 diabetes mellitus without complications: Secondary | ICD-10-CM

## 2011-10-16 DIAGNOSIS — Z79899 Other long term (current) drug therapy: Secondary | ICD-10-CM

## 2011-10-16 DIAGNOSIS — Z125 Encounter for screening for malignant neoplasm of prostate: Secondary | ICD-10-CM

## 2011-10-16 HISTORY — DX: Other long term (current) drug therapy: Z79.899

## 2011-10-16 LAB — BASIC METABOLIC PANEL
CO2: 27 mEq/L (ref 19–32)
Chloride: 100 mEq/L (ref 96–112)
Creatinine, Ser: 1 mg/dL (ref 0.4–1.5)
Potassium: 4.8 mEq/L (ref 3.5–5.1)
Sodium: 134 mEq/L — ABNORMAL LOW (ref 135–145)

## 2011-10-16 LAB — HEPATIC FUNCTION PANEL
ALT: 19 U/L (ref 0–53)
AST: 16 U/L (ref 0–37)
Alkaline Phosphatase: 61 U/L (ref 39–117)
Bilirubin, Direct: 0.1 mg/dL (ref 0.0–0.3)
Total Bilirubin: 0.4 mg/dL (ref 0.3–1.2)
Total Protein: 6.5 g/dL (ref 6.0–8.3)

## 2011-10-16 LAB — CBC WITH DIFFERENTIAL/PLATELET
Eosinophils Relative: 5.1 % — ABNORMAL HIGH (ref 0.0–5.0)
Lymphocytes Relative: 30.4 % (ref 12.0–46.0)
Monocytes Relative: 7.9 % (ref 3.0–12.0)
Neutrophils Relative %: 56.2 % (ref 43.0–77.0)
Platelets: 238 10*3/uL (ref 150.0–400.0)
WBC: 9.3 10*3/uL (ref 4.5–10.5)

## 2011-10-16 LAB — PSA: PSA: 0.61 ng/mL (ref 0.10–4.00)

## 2011-10-16 LAB — HEMOGLOBIN A1C: Hgb A1c MFr Bld: 7.9 % — ABNORMAL HIGH (ref 4.6–6.5)

## 2011-10-16 LAB — MICROALBUMIN / CREATININE URINE RATIO
Creatinine,U: 33.1 mg/dL
Microalb Creat Ratio: 4.5 mg/g (ref 0.0–30.0)

## 2011-10-16 MED ORDER — GLUCOSE BLOOD VI STRP: 1.0000 | ORAL_STRIP | Freq: Every day | Status: DC

## 2011-10-16 MED ORDER — BROMOCRIPTINE MESYLATE 2.5 MG PO TABS: 2.5000 mg | ORAL_TABLET | Freq: Every day | ORAL | Status: DC

## 2011-10-16 NOTE — Patient Instructions (Addendum)
blood tests are being requested for you today.  You will receive a letter with results. Please start taking "bromocriptine," to help your blood sugar. It has possible side effects of nausea and dizziness.  These go away with time.  You can avoid these by taking it at bedtime, and by taking just take 1/2 pill for the first week.   Please come in soon for a "medicare wellness" visit with dr Felicity Coyer.

## 2011-10-16 NOTE — Progress Notes (Signed)
Subjective:    Patient ID: [name], male    DOB: [birth year], 56 y.o.   MRN: [id number]  HPI Pt returns for f/u of type 2 DM (dx'ed 2009; complicated by PAD, peripheral sensory neuropathy, and CAD).  He does not want to take [place], due to fear of cancer.  He takes metformin and amaryl as rx'ed.  he brings a record of his cbg's which i have reviewed today. It varies from 129-271.  There is no trend throughout the day.  He had an episode of chills after he missed a meal.  sxs resolved with drinking.   Past Medical History  Diagnosis Date  . Atherosclerosis of native arteries of the extremities with rest pain   . Coronary atherosclerosis of native coronary artery   . PAD (peripheral artery disease)   . DM type 2 (diabetes mellitus, type 2)   . Other and unspecified hyperlipidemia   . Unspecified essential hypertension   . Other peripheral vascular disease   . Tobacco use disorder   . H/O alcohol abuse   . H/O: obesity     lost 100lbs  . Non Hodgkin's lymphoma     in remission    No past surgical history on file.  History   Social History  . Marital Status: Married    Spouse Name: N/A    Number of Children: N/A  . Years of Education: N/A   Occupational History  .      Disabled. Former Investment banker, operational.   Social History Main Topics  . Smoking status: Current Everyday Smoker -- 1.0 packs/day for 43 years    Types: Cigarettes  . Smokeless tobacco: Never Used  . Alcohol Use: No     former heavy user  . Drug Use: No  . Sexually Active: Not on file   Other Topics Concern  . Not on file   Social History Narrative   The patient is married. The patient has no children.Patient with a history of smoking one pack per day for 43 years. Patient started smoking at the age of 41.Patient no longer drinks alcohol. Patient quit proximally one and a half years ago from a previously heavy alcohol use.    Current Outpatient Prescriptions on File Prior to Visit  Medication Sig Dispense Refill    . amoxicillin (AMOXIL) 500 MG tablet Take 500 mg by mouth 3 (three) times daily.      Marland Kitchen aspirin EC 81 MG tablet Take 1 tablet (81 mg total) by mouth daily.      Marland Kitchen atorvastatin (LIPITOR) 20 MG tablet Take 1 tablet (20 mg total) by mouth daily.  90 tablet  3  . cilostazol (PLETAL) 100 MG tablet Take 1 tablet (100 mg total) by mouth 2 (two) times daily.  180 tablet  3  . clopidogrel (PLAVIX) 75 MG tablet Take 1 tablet (75 mg total) by mouth daily.  90 tablet  3  . Diphenhydramine-Acetaminophen (PERCOGESIC PO) Take by mouth as needed.      . fenofibrate (TRICOR) 145 MG tablet Take 1 tablet (145 mg total) by mouth daily.  90 tablet  3  . glimepiride (AMARYL) 2 MG tablet TAKE ONE TABLET BY MOUTH EVERY DAY  90 tablet  0  . lisinopril (PRINIVIL,ZESTRIL) 40 MG tablet Take 1 tablet (40 mg total) by mouth daily.  90 tablet  3  . metFORMIN (GLUCOPHAGE-XR) 500 MG 24 hr tablet TAKE TWO TABLETS BY MOUTH TWICE DAILY  360 tablet  0  . metoprolol (LOPRESSOR)  50 MG tablet Take 1 tablet (50 mg total) by mouth 2 (two) times daily.  180 tablet  3  . bromocriptine (PARLODEL) 2.5 MG tablet Take 1 tablet (2.5 mg total) by mouth daily.  90 tablet  3    Allergies  Allergen Reactions  . Actos (Pioglitazone)     Rash and edema     Family History  Problem Relation Age of Onset  . Heart attack Mother     54's  . Other Father     pad  . Heart attack      40's  . Heart attack      40's    BP 122/82  Pulse 84  Temp 97.4 F (36.3 C) (Oral)  Ht 5' 9.5" (1.765 m)  Wt 199 lb (90.266 kg)  BMI 28.97 kg/m2  SpO2 96%  Review of Systems Denies loc and weight change.  He has numbness of the feet.  He has chronic left foot pain.    Objective:   Physical Exam VITAL SIGNS:  See vs page GENERAL: no distress Pulses: dorsalis pedis are absent bilaterally.   Feet: no deformity.  no ulcer on the feet.  feet are of normal temp.  There is patchy hyperpigmentation of the feet and legs.  no edema.  There is bilateral  onychomycosis. Neuro: sensation is intact to touch on the feet, but decreased from normal.  Lab Results  Component Value Date   HGBA1C 7.9* 10/16/2011      Assessment & Plan:  DM.  needs increased rx

## 2011-10-17 ENCOUNTER — Ambulatory Visit (HOSPITAL_COMMUNITY): Payer: Self-pay | Admitting: Dentistry

## 2011-10-17 ENCOUNTER — Encounter (HOSPITAL_COMMUNITY): Payer: Self-pay | Admitting: Dentistry

## 2011-10-17 ENCOUNTER — Telehealth: Payer: Self-pay | Admitting: *Deleted

## 2011-10-17 VITALS — BP 134/78 | HR 85 | Temp 97.6°F

## 2011-10-17 DIAGNOSIS — I739 Peripheral vascular disease, unspecified: Secondary | ICD-10-CM

## 2011-10-17 DIAGNOSIS — I2510 Atherosclerotic heart disease of native coronary artery without angina pectoris: Secondary | ICD-10-CM

## 2011-10-17 DIAGNOSIS — M264 Malocclusion, unspecified: Secondary | ICD-10-CM

## 2011-10-17 DIAGNOSIS — K011 Impacted teeth: Secondary | ICD-10-CM

## 2011-10-17 DIAGNOSIS — K045 Chronic apical periodontitis: Secondary | ICD-10-CM

## 2011-10-17 DIAGNOSIS — K0401 Reversible pulpitis: Secondary | ICD-10-CM

## 2011-10-17 DIAGNOSIS — K0530 Chronic periodontitis, unspecified: Secondary | ICD-10-CM

## 2011-10-17 DIAGNOSIS — I10 Essential (primary) hypertension: Secondary | ICD-10-CM

## 2011-10-17 DIAGNOSIS — K036 Deposits [accretions] on teeth: Secondary | ICD-10-CM

## 2011-10-17 DIAGNOSIS — K029 Dental caries, unspecified: Secondary | ICD-10-CM

## 2011-10-17 DIAGNOSIS — K083 Retained dental root: Secondary | ICD-10-CM

## 2011-10-17 DIAGNOSIS — K0889 Other specified disorders of teeth and supporting structures: Secondary | ICD-10-CM

## 2011-10-17 DIAGNOSIS — K08109 Complete loss of teeth, unspecified cause, unspecified class: Secondary | ICD-10-CM

## 2011-10-17 NOTE — Progress Notes (Signed)
DENTAL CONSULTATION  Date of Consultation:  10/17/2011 Patient Name:   [name] Date of Birth:   03/24/1956 Medical Record Number: [id number]  VITALS: BP 134/78  Pulse 85  Temp 97.6 F (36.4 C) (Oral)   CHIEF COMPLAINT: Patient referred for evaluation of poor dentition and history of dental abscess.  HPI: [name] is a 56 year old male referred by Dr. Shirlee Latch for a dental consultation. Patient with known coronary artery disease and peripheral artery disease. Patient with a history of toothache symptoms and "dental abscess". Patient was evaluated by urgent care and prescribed amoxicillin antibiotic therapy on 10/10/2011 for 10 days. Patient subsequent saw Dr. Shirlee Latch for cardiac evaluation. Patient referred to dental medicine to rule out dental infection that may affect the patient's systemic health.  The patient with a history of toothache and abscess. Patient indicates tooth #29 has been hurting off and on for the past one to 2 weeks. Patient describes the pain as being sharp, constant, and reached an intensity of 8/10. Patient currently indicates that the tooth pain is 1/10 today. Patient has not seen a dentist for over 3 years and only had dental x-rays taken at that time. Patient indicates it's probably been 15 years since he had a tooth pulled at that time. Patient denies complications with that dental extraction although the dentist" had a hard time getting it out and".  Patient denies presence of partial dentures. Patient has not had his teeth cleaned in over 15 years. Patient has no regular primary dentist.   PMH: Past Medical History  Diagnosis Date  . Atherosclerosis of native arteries of the extremities with rest pain   . Coronary atherosclerosis of native coronary artery   . PAD (peripheral artery disease)   . DM type 2 (diabetes mellitus, type 2)   . Other and unspecified hyperlipidemia   . Unspecified essential hypertension   . Other peripheral vascular disease    . Tobacco use disorder   . H/O alcohol abuse   . H/O: obesity     lost 100lbs  . Non Hodgkin's lymphoma     in remission    PSH: History reviewed. No pertinent past surgical history.  ALLERGIES: Allergies  Allergen Reactions  . Actos (Pioglitazone)     Rash and edema     MEDICATIONS: Current Outpatient Prescriptions  Medication Sig Dispense Refill  . amoxicillin (AMOXIL) 500 MG tablet Take 500 mg by mouth 3 (three) times daily.      Marland Kitchen aspirin EC 81 MG tablet Take 1 tablet (81 mg total) by mouth daily.      . cilostazol (PLETAL) 100 MG tablet Take 1 tablet (100 mg total) by mouth 2 (two) times daily.  180 tablet  3  . Diphenhydramine-Acetaminophen (PERCOGESIC PO) Take by mouth as needed.      Marland Kitchen glimepiride (AMARYL) 2 MG tablet TAKE ONE TABLET BY MOUTH EVERY DAY  90 tablet  0  . glucose blood (FREESTYLE LITE) test strip 1 each by Other route daily. And lancets 1/day 250.72  100 each  3  . lisinopril (PRINIVIL,ZESTRIL) 40 MG tablet Take 1 tablet (40 mg total) by mouth daily.  90 tablet  3  . metFORMIN (GLUCOPHAGE-XR) 500 MG 24 hr tablet TAKE TWO TABLETS BY MOUTH TWICE DAILY  360 tablet  0  . metoprolol (LOPRESSOR) 50 MG tablet Take 1 tablet (50 mg total) by mouth 2 (two) times daily.  180 tablet  3  . pravastatin (PRAVACHOL) 40 MG tablet Take  40 mg by mouth daily.      Marland Kitchen atorvastatin (LIPITOR) 20 MG tablet Take 1 tablet (20 mg total) by mouth daily.  90 tablet  3  . bromocriptine (PARLODEL) 2.5 MG tablet Take 1 tablet (2.5 mg total) by mouth daily.  90 tablet  3  . clopidogrel (PLAVIX) 75 MG tablet Take 1 tablet (75 mg total) by mouth daily.  90 tablet  3  . fenofibrate (TRICOR) 145 MG tablet Take 1 tablet (145 mg total) by mouth daily.  90 tablet  3    LABS: Lab Results  Component Value Date   WBC 9.3 10/16/2011   HGB 15.8 10/16/2011   HCT 45.3 10/16/2011   MCV 97.8 10/16/2011   PLT 238.0 10/16/2011      Component Value Date/Time   NA 134* 10/16/2011 0858   K 4.8  10/16/2011 0858   CL 100 10/16/2011 0858   CO2 27 10/16/2011 0858   GLUCOSE 277* 10/16/2011 0858   BUN 12 10/16/2011 0858   CREATININE 1.0 10/16/2011 0858   CALCIUM 9.2 10/16/2011 0858   GFRNONAA 79.63 01/22/2010 0000   GFRAA  Value: >60        The eGFR has been calculated using the MDRD equation. This calculation has not been validated in all clinical 10/15/2007 0350   Lab Results  Component Value Date   INR 1.0 ratio 06/11/2009   INR 0.9 10/13/2007   No results found for this basename: PTT    SOCIAL HISTORY: History   Social History  . Marital Status: Married    Spouse Name: N/A    Number of Children: N/A  . Years of Education: N/A   Occupational History  .      Disabled. Former Investment banker, operational.   Social History Main Topics  . Smoking status: Current Everyday Smoker -- 1.0 packs/day for 43 years    Types: Cigarettes  . Smokeless tobacco: Never Used  . Alcohol Use: No     former heavy user  . Drug Use: No  . Sexually Active: Not on file   Other Topics Concern  . Not on file   Social History Narrative   The patient is married. The patient has no children.Patient with a history of smoking one pack per day for 43 years. Patient started smoking at the age of 36.Patient no longer drinks alcohol. Patient quit proximally one and a half years ago from a previously heavy alcohol use.    FAMILY HISTORY: Family History  Problem Relation Age of Onset  . Heart attack Mother     42's  . Other Father     pad  . Heart attack      40's  . Heart attack      40's     REVIEW OF SYSTEMS: Reviewed with patient today and positive as above.  DENTAL HISTORY: CHIEF COMPLAINT: Patient referred for evaluation of poor dentition and history of dental abscess.  HPI: [name] is a 56 year old male referred by Dr. Shirlee Latch for a dental consultation. Patient with known coronary artery disease and peripheral artery disease. Patient with a history of toothache symptoms and "dental abscess". Patient  was evaluated by urgent care and prescribed amoxicillin antibiotic therapy on 10/10/2011 for 10 days. Patient subsequent saw Dr. Shirlee Latch for cardiac evaluation. Patient referred to dental medicine to rule out dental infection that may affect the patient's systemic health.  The patient with a history of toothache and abscess. Patient indicates tooth #29 has been  hurting off and on for the past one to 2 weeks. Patient describes the pain as being sharp, constant, and reached an intensity of 8/10. Patient currently indicates that the tooth pain is 1/10 today. Patient has not seen a dentist for over 3 years and only had dental x-rays taken at that time. Patient indicates it's probably been 15 years since he had a tooth pulled at that time. Patient denies complications with that dental extraction although the dentist" had a hard time getting it out and".  Patient denies presence of partial dentures. Patient has not had his teeth cleaned in over 15 years. Patient has no regular primary dentist.   DENTAL EXAMINATION:  GENERAL: Patient is a well-developed, well-nourished male in no acute distress. HEAD AND NECK: There is no obvious submandibular lymphadenopathy. The patient denies acute TMJ symptoms. INTRAORAL EXAM: Patient has normal saliva. I do not see any evidence of abscess formation in the mouth. DENTITION: The patient is missing tooth numbers 3, 14, 17, 18, 19, 20, 30, and 32.  Tooth numbers 1 and 16 are full bony impactions. PERIODONTAL: The patient has chronic periodontitis with plaque and calculus accumulations, generalized gingival recession, and incipient tooth mobility. DENTAL CARIES/SUBOPTIMAL RESTORATIONS: The patient has rampant dental caries affecting almost all the remaining dentition as per dental charting form. ENDODONTIC: Patient has a history of acute pulpitis symptoms. This involves tooth #29. There is evidence of periapical pathology and radiolucency associated with tooth numbers 5, 6, 9,  11, 12, and 29. No oral abscess is noted. Patient has had previous root canal therapy associated with tooth numbers 8 and 10 that are now exposed to the oral environment. CROWN AND BRIDGE: There are no crown restorations. PROSTHODONTIC: There are no partial dentures. OCCLUSION: Patient has a poor occlusal scheme secondary to multiple missing teeth, multiple retained root segments, and lack of replacement of missing teeth with dental prostheses.  RADIOGRAPHIC INTERPRETATION: A panoramic x-ray was taken and supplemented with 11 periapical radiographs. There are multiple missing teeth. There multiple retained root segments. There are rampant dental caries noted. There is an impacted tooth #1 and 16. There is moderate bone loss noted. There is supra-eruption and drifting of the unopposed teeth into the edentulous areas.  ASSESSMENTS: 1. Chronic apical periodontitis  2. Chronic periodontitis 3. Accretions 4. Rampant dental caries 5. Multiple missing teeth 6. Multiple retained root segments 7. Fully bony impacted tooth numbers 1 and 16 8. Tooth mobility 9. generalized gingival recession 10. History of oral neglect 11.  Current risk for bleeding with invasive dental procedures on the Pletal and Plavix therapies. 12.  Significant cardiovascular compromise with potential complications up to and including death as discussed with the patient.     PLAN/RECOMMENDATIONS: 1. I discussed the risks, benefits, and complications of various treatment options with the patient in relationship to his medical and dental conditions. We discussed various treatment options to include no treatment, multiple extractions with alveoloplasty, pre-prosthetic surgery as indicated, periodontal therapy, dental restorations, root canal therapy, crown and bridge therapy, implant therapy, and replacement of missing teeth as indicated. The patient currently wishes to proceed with extraction of all remaining teeth with  alveoloplasty with the exception of the impacted tooth numbers 1 and 16. This will be in the operating room with general anesthesia.  Patient was given the option of going to an oral surgeon to have all teeth including the impacted teeth removed at this time but the patient refused.  Patient will then followup with a dentist of  his choice for fabrication of upper lower complete dentures after adequate healing. The patient is aware that the Pletal therapy will need to be discontinued prior to the dental surgery.  PLEASE NOTE: THE PATIENT HAS NOT STARTED TAKING PLAVIX THERAPY TO DATE.   2. Discussion of findings with medical team and coordination of future medical and dental care.  Schedule operative procedure after patient is cleared for the oral surgery as described above.    Charlynne Pander, DDS

## 2011-10-17 NOTE — Telephone Encounter (Signed)
Called pt to inform of lab results, pt informed (letter also mailed to pt). 

## 2011-10-20 ENCOUNTER — Telehealth: Payer: Self-pay | Admitting: *Deleted

## 2011-10-20 NOTE — Telephone Encounter (Signed)
Pt states that Bromocriptine rx is too expensive, and is requesting alternative medication for DM. Left message for pt to callback office to inform pt SAE is out of office until Next Monday.

## 2011-10-21 NOTE — Telephone Encounter (Signed)
Pt informed that SAE is out until next Monday. He is requesting an alternative medication for Bromocriptine due to cost. Please advise in SAE's absence-thanks!

## 2011-10-21 NOTE — Telephone Encounter (Signed)
This will need to wait for Dr Everardo All

## 2011-10-22 NOTE — Telephone Encounter (Signed)
To forward for Dr Everardo All

## 2011-10-22 NOTE — Telephone Encounter (Signed)
Patient informed. 

## 2011-10-23 ENCOUNTER — Other Ambulatory Visit (HOSPITAL_COMMUNITY): Payer: Self-pay | Admitting: Dentistry

## 2011-10-23 NOTE — H&P (Signed)
See Dr. Freida Busman McLean's note on 10/13/11 to use as H and P for dental OR procedures. Dr. Kristin Bruins  Progress Notes [name] (MR# [id number])         Progress Notes Info       Author  Note Status  Last Update User  Last Update Date/Time    Laurey Morale, MD  Signed  Laurey Morale, MD  10/13/2011 10:52 PM          Progress Notes     PCP: Dr. Everardo All  56 yo with history of CAD, severe PAD, and diabetes presents for followup. He had NSTEMI in 6/09 that was treated medically as patient refused catheterization due to financial issues. He also has severe claudication. Patient had catheterization in 2/11 showing totally occluded circumflex artery with some collaterals as well as severe PAD (see PMH for description). His best option for revascularization would be a fem-pop bypass. Patient saw vascular surgery at that time but decided against bypass procedure.  Patient denies any recent chest pain. Main complaint is leg/foot pain. He continues to smoke 1 ppd. He has right foot and calf pain at rest at times. He does not have any ulcers on his feet. He develops calf pain with < 10 feet walking. This has worsened since I last saw him. He is not taking Plavix because of expense. He is still on his cilostazol and ASA. He denies exertional dyspnea but is not very active. He is on antibiotics currently that he got at Urgent Care for tooth abscesses. He needs several teeth pulled.  ECG: NSR, normal.  Labs (3/11): HDL 20 => 36, LDL 72, TGs 4098 => 448  Labs (2/11): K 4.4, creatinine 0.8  Allergies (verified):  No Known Drug Allergies  Past Medical History:  1. Non-Hodgkin lymphoma. This was treated about 10 years ago with radiation, it is now in remission. He had it in his axillary area and inguinal areas.  2. Formerly morbidly obese, but lost 100 pounds by diet and exercise.  3. Hypertension.  4. Hypercholesterolemia.  5. History of alcohol abuse.  6. Current smoker. He smokes a pack a day.  ? COPD.  7. Coronary artery disease. The patient did have a non-ST-elevation MI in June 2009 that was treated medically. The patient refused LHC at that time. LHC done 2/11 showed totally occluded CFX with faint R->L collaterals, 40-50% proximal LAD, patent RCA. Renal arteries were patent.  8. Echocardiogram done in June 2009 showed an EF of 60%, mild LVH, no regional wall motion abnormalities.  9. PAD. He underwent ABIs and lower extremity duplex scanning on February 18, 2008. This demonstrated occlusion of the distal right superficial femoral artery with reconstitution at the level of the popliteal artery. There appeared to be some degree of inflow disease because of biphasic common femoral waveforms. The ABI was 0.49 on the right and 0.87 on the left. Peripheral angiogram (2/11) showed 50% L CIA stenosis, 90% stenosis of L SFA and popliteal, totally occluded R SFA with reconstitution of popliteal. Best option would be fem-pop bypass but patient opted for medical treatment in 2011.  10. Type II diabetes: Peripheral edema with Actos.  Family History:  The patient's mother had a myocardial infarction in her 62s. He had 2 aunts with myocardial infarctions in their 25s. His father had peripheral arterial disease. No diabetes in his immediate family.  Social History:  The patient lives with his wife. He is an unemployed Investment banker, operational, originally from [place].  He has no children. He smokes 1 pack a day and he has done so for years. He used to drink heavily but has quit. He is on disability.  Review of Systems  All systems reviewed and negative except as per HPI.     Current Outpatient Prescriptions     Medication  Sig  Dispense  Refill     .  amoxicillin (AMOXIL) 500 MG tablet  Take 500 mg by mouth 3 (three) times daily.       .  cilostazol (PLETAL) 100 MG tablet  Take 1 tablet (100 mg total) by mouth 2 (two) times daily.  180 tablet  3     .  Diphenhydramine-Acetaminophen (PERCOGESIC PO)  Take by mouth as  needed.       Marland Kitchen  glimepiride (AMARYL) 2 MG tablet  TAKE ONE TABLET BY MOUTH EVERY DAY  90 tablet  0     .  glimepiride (AMARYL) 2 MG tablet  Take 2 mg by mouth 2 (two) times daily.       .  metFORMIN (GLUCOPHAGE-XR) 500 MG 24 hr tablet  TAKE TWO TABLETS BY MOUTH TWICE DAILY  360 tablet  0     .  metoprolol (LOPRESSOR) 50 MG tablet  Take 1 tablet (50 mg total) by mouth 2 (two) times daily.  180 tablet  3     .  DISCONTD: aspirin 325 MG tablet  Take 325 mg by mouth.       .  DISCONTD: metoprolol (LOPRESSOR) 50 MG tablet  TAKE ONE TABLET BY MOUTH TWICE DAILY  60 tablet  6     .  DISCONTD: pravastatin (PRAVACHOL) 40 MG tablet  TAKE TWO TABLETS BY MOUTH EVERY DAY AT BEDTIME  60 tablet  12     .  aspirin EC 81 MG tablet  Take 1 tablet (81 mg total) by mouth daily.       .  clopidogrel (PLAVIX) 75 MG tablet  Take 1 tablet (75 mg total) by mouth daily.  90 tablet  3     .  lisinopril (PRINIVIL,ZESTRIL) 40 MG tablet  Take 1 tablet (40 mg total) by mouth daily.  90 tablet  3     .  omega-3 acid ethyl esters (LOVAZA) 1 G capsule  Take 2 g by mouth 2 (two) times daily.       .  pravastatin (PRAVACHOL) 80 MG tablet  Take 1 tablet (80 mg total) by mouth every evening.  90 tablet  3     .  DISCONTD: aspirin 81 MG tablet  Take 81 mg by mouth daily.       Marland Kitchen  DISCONTD: clopidogrel (PLAVIX) 75 MG tablet  Take 75 mg by mouth daily.       Marland Kitchen  DISCONTD: glimepiride (AMARYL) 2 MG tablet  TAKE ONE TABLET BY MOUTH EVERY DAY  90 tablet  0     BP 110/86  Pulse 75  Ht 5\' 9"  (1.753 m)  Wt 88.814 kg (195 lb 12.8 oz)  BMI 28.91 kg/m2  General: NAD  Neck: No JVD, no thyromegaly or thyroid nodule.  Lungs: Clear to auscultation bilaterally with normal respiratory effort.  CV: Nondisplaced PMI. Heart regular S1/S2, no S3/S4, no murmur. No peripheral edema. No carotid bruit. Unable to palpate DP or PT pulses on either leg.  Abdomen: Soft, nontender, no hepatosplenomegaly, no distention.  Neurologic: Alert and oriented x 3.    Psych: Normal affect.  Extremities: No clubbing or  cyanosis.

## 2011-10-24 MED ORDER — GLIMEPIRIDE 4 MG PO TABS: 4.0000 mg | ORAL_TABLET | Freq: Every day | ORAL | Status: DC

## 2011-10-24 NOTE — Addendum Note (Signed)
Addended by: ,  on: 10/24/2011 08:38 AM   Modules accepted: Orders  

## 2011-10-24 NOTE — Addendum Note (Signed)
Addended by: Romero Belling on: 10/24/2011 08:38 AM   Modules accepted: Orders

## 2011-10-24 NOTE — Telephone Encounter (Signed)
please call patient: Stop bromocriptine Increase glimepiride to 4 mg qam Ret within the next few weeks

## 2011-10-24 NOTE — Telephone Encounter (Signed)
Pt informed of MD's advisement, pt states that he will make appointment after he recovers from dental surgery appt next Friday.

## 2011-10-27 ENCOUNTER — Encounter (HOSPITAL_COMMUNITY): Payer: Self-pay | Admitting: Pharmacy Technician

## 2011-10-28 ENCOUNTER — Encounter (HOSPITAL_COMMUNITY): Payer: Self-pay

## 2011-10-28 ENCOUNTER — Encounter (HOSPITAL_COMMUNITY)
Admission: RE | Admit: 2011-10-28 | Discharge: 2011-10-28 | Disposition: A | Discharge status: Home / Self Care | Payer: Medicare HMO | Source: Ambulatory Visit | Attending: Dentistry | Admitting: Dentistry

## 2011-10-28 ENCOUNTER — Encounter (HOSPITAL_COMMUNITY)
Admission: RE | Admit: 2011-10-28 | Discharge: 2011-10-28 | Disposition: A | Discharge status: Home / Self Care | Payer: Medicare HMO | Source: Ambulatory Visit | Attending: Anesthesiology | Admitting: Anesthesiology

## 2011-10-28 HISTORY — DX: Acute myocardial infarction, unspecified: I21.9

## 2011-10-28 LAB — COMPREHENSIVE METABOLIC PANEL
ALT: 16 U/L (ref 0–53)
AST: 16 U/L (ref 0–37)
Albumin: 4.1 g/dL (ref 3.5–5.2)
Alkaline Phosphatase: 57 U/L (ref 39–117)
GFR calc Af Amer: >90 mL/min (ref >90)
Glucose, Bld: 284 mg/dL — ABNORMAL HIGH (ref 70–99)
Potassium: 4.8 mEq/L (ref 3.5–5.1)
Sodium: 132 mEq/L — ABNORMAL LOW (ref 135–145)
Total Protein: 7.2 g/dL (ref 6.0–8.3)

## 2011-10-28 LAB — CBC
Hemoglobin: 16.9 g/dL (ref 13.0–17.0)
MCHC: 36.3 g/dL — ABNORMAL HIGH (ref 30.0–36.0)
Platelets: 211 10*3/uL (ref 150–400)
RBC: 4.95 MIL/uL (ref 4.22–5.81)

## 2011-10-28 NOTE — Progress Notes (Signed)
Patient states sensitive to most soaps. Gave Hibiclens and told to try it on one area to see if he can use it. Has never tried dial soap prior. Instructed if Hibiclens is irritating to use his regular sensitive skin soap the night before and a.m. Of surgery.

## 2011-10-28 NOTE — Pre-Procedure Instructions (Signed)
20 [name]  10/28/2011   Your procedure is scheduled on:  10/31/11  Report to [facility] at 8:30 AM.  Call this number if you have problems the morning of surgery: [phone]   Remember:   Do not eat food or drink liquids:After Midnight.    Take these medicines the morning of surgery with A SIP OF WATER: Metoprolol/Lopressor   Do not wear jewelry, make-up or nail polish.  Do not wear lotions, powders, or perfumes. You may wear deodorant.  Do not shave 48 hours prior to surgery. Men may shave face and neck.  Do not bring valuables to the hospital.  Contacts, dentures or bridgework may not be worn into surgery.  Leave suitcase in the car. After surgery it may be brought to your room.  For patients admitted to the hospital, checkout time is 11:00 AM the day of discharge.   Patients discharged the day of surgery will not be allowed to drive home.    Special Instructions: CHG Shower Use Special Wash: 1/2 bottle night before surgery and 1/2 bottle morning of surgery.   Please read over the following fact sheets that you were given: Pain Booklet, Coughing and Deep Breathing and Surgical Site Infection Prevention

## 2011-10-28 NOTE — Progress Notes (Signed)
Randall Chock PA to review chart due to cardiac hx. Glucose 284.

## 2011-10-29 NOTE — Consult Note (Signed)
Anesthesia Chart Review:  Patient is a 56 year old male scheduled for multiple teeth extractions with alveoloplasty by Dr. Kristin Bruins on 10/31/11.  History include recent dental abscess, CAD with NSTEMI '09, PVD, DM2, smoking, prior  ETOH abuse, Non-Hodgkin's Lymphoma.  PCP is Dr. Romero Belling.  Patient's Cardiologist is Dr. Shirlee Latch who actually referred patient to Dr. Kristin Bruins for teeth extractions.  He was also referred to [facility] (Dr. Myra Gianotti) for consideration of LE bypass.  Labs noted.  Na 132.  Non-fasting glucose 284.  Recheck CBG on arrival and treat as indicated.  He had a normal CXR on 10/28/11.    He had a normal EKG at [facility] on 10/13/11.    Cardiac cath by Dr. Excell Seltzer on 06/20/09 (copy found under Media tab) showed: occluded LCx, non-obstructive stenosis of the LAD (mid LAD 40-50%, DIAG with mild non-obstructive stenosis) and RCA (mid RCA 30-40%, posterior AV segment 50%).  Echo on 10/13/07 showed: - Overall left ventricular systolic function was normal. Left ventricular ejection fraction was estimated to be 60 %. There was no diagnostic evidence of left ventricular regional wall motion abnormalities. Left ventricular wall thickness was mildly increased.  Anticipate he can proceed as planned.    Shonna Chock, PA-C

## 2011-10-30 MED ORDER — CEFAZOLIN SODIUM-DEXTROSE 2-3 GM-% IV SOLR
2.0000 g | INTRAVENOUS | Status: AC
  Administered 2011-10-31: 2 g via INTRAVENOUS
  Filled 2011-10-30: qty 50

## 2011-10-31 ENCOUNTER — Encounter (HOSPITAL_COMMUNITY): Payer: Self-pay | Admitting: Vascular Surgery

## 2011-10-31 ENCOUNTER — Encounter (HOSPITAL_COMMUNITY): Payer: Self-pay | Admitting: *Deleted

## 2011-10-31 ENCOUNTER — Encounter (HOSPITAL_COMMUNITY)
Admission: RE | Disposition: A | Discharge status: Home / Self Care | Payer: Self-pay | Source: Ambulatory Visit | Attending: Dentistry

## 2011-10-31 ENCOUNTER — Ambulatory Visit (HOSPITAL_COMMUNITY)
Admission: RE | Admit: 2011-10-31 | Discharge: 2011-10-31 | Disposition: A | Discharge status: Home / Self Care | Payer: Medicare HMO | Source: Ambulatory Visit | Attending: Dentistry | Admitting: Dentistry

## 2011-10-31 ENCOUNTER — Ambulatory Visit (HOSPITAL_COMMUNITY): Payer: Medicare HMO | Admitting: Vascular Surgery

## 2011-10-31 DIAGNOSIS — K029 Dental caries, unspecified: Secondary | ICD-10-CM | POA: Diagnosis present

## 2011-10-31 DIAGNOSIS — K045 Chronic apical periodontitis: Secondary | ICD-10-CM

## 2011-10-31 DIAGNOSIS — I252 Old myocardial infarction: Secondary | ICD-10-CM | POA: Insufficient documentation

## 2011-10-31 DIAGNOSIS — E119 Type 2 diabetes mellitus without complications: Secondary | ICD-10-CM | POA: Insufficient documentation

## 2011-10-31 DIAGNOSIS — K0530 Chronic periodontitis, unspecified: Secondary | ICD-10-CM | POA: Diagnosis present

## 2011-10-31 DIAGNOSIS — K083 Retained dental root: Secondary | ICD-10-CM | POA: Diagnosis present

## 2011-10-31 DIAGNOSIS — I10 Essential (primary) hypertension: Secondary | ICD-10-CM | POA: Insufficient documentation

## 2011-10-31 DIAGNOSIS — Z01812 Encounter for preprocedural laboratory examination: Secondary | ICD-10-CM | POA: Insufficient documentation

## 2011-10-31 DIAGNOSIS — I2510 Atherosclerotic heart disease of native coronary artery without angina pectoris: Secondary | ICD-10-CM | POA: Insufficient documentation

## 2011-10-31 DIAGNOSIS — Z01818 Encounter for other preprocedural examination: Secondary | ICD-10-CM | POA: Insufficient documentation

## 2011-10-31 DIAGNOSIS — F17200 Nicotine dependence, unspecified, uncomplicated: Secondary | ICD-10-CM | POA: Insufficient documentation

## 2011-10-31 DIAGNOSIS — Z794 Long term (current) use of insulin: Secondary | ICD-10-CM | POA: Insufficient documentation

## 2011-10-31 DIAGNOSIS — I739 Peripheral vascular disease, unspecified: Secondary | ICD-10-CM | POA: Insufficient documentation

## 2011-10-31 HISTORY — PX: MULTIPLE EXTRACTIONS WITH ALVEOLOPLASTY: SHX5342

## 2011-10-31 LAB — GLUCOSE, CAPILLARY: Glucose-Capillary: 161 mg/dL — ABNORMAL HIGH (ref 70–99)

## 2011-10-31 SURGERY — MULTIPLE EXTRACTION WITH ALVEOLOPLASTY
Anesthesia: General | Site: Mouth | Wound class: Clean Contaminated

## 2011-10-31 MED ORDER — LIDOCAINE-EPINEPHRINE 2 %-1:100000 IJ SOLN
INTRAMUSCULAR | Status: DC | PRN
  Administered 2011-10-31 (×3): 1.7 mL

## 2011-10-31 MED ORDER — ROCURONIUM BROMIDE 100 MG/10ML IV SOLN
INTRAVENOUS | Status: DC | PRN
  Administered 2011-10-31: 50 mg via INTRAVENOUS

## 2011-10-31 MED ORDER — ISOPROPYL ALCOHOL 70 % SOLN
Status: DC | PRN
  Administered 2011-10-31: 1 via TOPICAL

## 2011-10-31 MED ORDER — PHENYLEPHRINE HCL 10 MG/ML IJ SOLN
INTRAMUSCULAR | Status: DC | PRN
  Administered 2011-10-31: 80 ug via INTRAVENOUS
  Administered 2011-10-31: 40 ug via INTRAVENOUS
  Administered 2011-10-31 (×4): 80 ug via INTRAVENOUS

## 2011-10-31 MED ORDER — FENTANYL CITRATE 0.05 MG/ML IJ SOLN
INTRAMUSCULAR | Status: DC | PRN
  Administered 2011-10-31: 50 ug via INTRAVENOUS
  Administered 2011-10-31: 100 ug via INTRAVENOUS

## 2011-10-31 MED ORDER — BUPIVACAINE-EPINEPHRINE 0.5% -1:200000 IJ SOLN
INTRAMUSCULAR | Status: DC | PRN
  Administered 2011-10-31 (×5): 1.8 mL

## 2011-10-31 MED ORDER — OXYMETAZOLINE HCL 0.05 % NA SOLN
NASAL | Status: DC | PRN
  Administered 2011-10-31: 4 via NASAL

## 2011-10-31 MED ORDER — PROPOFOL 10 MG/ML IV EMUL
INTRAVENOUS | Status: DC | PRN
  Administered 2011-10-31: 200 mg via INTRAVENOUS

## 2011-10-31 MED ORDER — BUPIVACAINE-EPINEPHRINE (PF) 0.5% -1:200000 IJ SOLN
INTRAMUSCULAR | Status: AC
  Filled 2011-10-31: qty 7.2

## 2011-10-31 MED ORDER — BUPIVACAINE-EPINEPHRINE (PF) 0.5% -1:200000 IJ SOLN
INTRAMUSCULAR | Status: AC
  Filled 2011-10-31: qty 5.4

## 2011-10-31 MED ORDER — MIDAZOLAM HCL 5 MG/5ML IJ SOLN
INTRAMUSCULAR | Status: DC | PRN
  Administered 2011-10-31: 2 mg via INTRAVENOUS

## 2011-10-31 MED ORDER — ARTIFICIAL TEARS OP OINT
TOPICAL_OINTMENT | OPHTHALMIC | Status: DC | PRN
  Administered 2011-10-31: 1 via OPHTHALMIC

## 2011-10-31 MED ORDER — LACTATED RINGERS IV SOLN
INTRAVENOUS | Status: DC | PRN
  Administered 2011-10-31 (×2): via INTRAVENOUS

## 2011-10-31 MED ORDER — AMINOCAPROIC ACID SOLUTION 5% (50 MG/ML)
100.0000 mL | ORAL | Status: DC
  Filled 2011-10-31: qty 100

## 2011-10-31 MED ORDER — OXYMETAZOLINE HCL 0.05 % NA SOLN
NASAL | Status: DC | PRN
  Administered 2011-10-31: 1 via NASAL

## 2011-10-31 MED ORDER — LIDOCAINE-EPINEPHRINE 2 %-1:100000 IJ SOLN
INTRAMUSCULAR | Status: AC
  Filled 2011-10-31: qty 5.1

## 2011-10-31 MED ORDER — OXYCODONE-ACETAMINOPHEN 5-325 MG PO TABS
ORAL_TABLET | ORAL | Status: AC
Start: 2011-10-31 — End: 2011-11-10

## 2011-10-31 MED ORDER — LIDOCAINE HCL 1 % IJ SOLN
INTRAMUSCULAR | Status: DC | PRN
  Administered 2011-10-31: 100 mg via INTRADERMAL

## 2011-10-31 MED ORDER — HEMOSTATIC AGENTS (NO CHARGE) OPTIME
TOPICAL | Status: DC | PRN
  Administered 2011-10-31: 1 via TOPICAL

## 2011-10-31 MED ORDER — SUCCINYLCHOLINE CHLORIDE 20 MG/ML IJ SOLN
INTRAMUSCULAR | Status: DC | PRN
  Administered 2011-10-31: 100 mg via INTRAVENOUS

## 2011-10-31 MED ORDER — LACTATED RINGERS IV SOLN
INTRAVENOUS | Status: DC
  Administered 2011-10-31: 10:00:00 via INTRAVENOUS

## 2011-10-31 MED ORDER — METOCLOPRAMIDE HCL 5 MG/ML IJ SOLN
INTRAMUSCULAR | Status: DC | PRN
  Administered 2011-10-31: 10 mg via INTRAVENOUS

## 2011-10-31 MED ORDER — ACETAMINOPHEN 10 MG/ML IV SOLN
INTRAVENOUS | Status: AC
  Filled 2011-10-31: qty 100

## 2011-10-31 MED ORDER — ACETAMINOPHEN 10 MG/ML IV SOLN
INTRAVENOUS | Status: DC | PRN
  Administered 2011-10-31: 1000 mg via INTRAVENOUS

## 2011-10-31 MED ORDER — ONDANSETRON HCL 4 MG/2ML IJ SOLN
INTRAMUSCULAR | Status: DC | PRN
  Administered 2011-10-31: 4 mg via INTRAVENOUS

## 2011-10-31 MED ORDER — OXYMETAZOLINE HCL 0.05 % NA SOLN
NASAL | Status: AC
  Filled 2011-10-31: qty 15

## 2011-10-31 MED ORDER — PHENYLEPHRINE HCL 10 MG/ML IJ SOLN
10.0000 mg | INTRAVENOUS | Status: DC | PRN
  Administered 2011-10-31: 20 ug/min via INTRAVENOUS

## 2011-10-31 MED ORDER — ACETAMINOPHEN 10 MG/ML IV SOLN
1000.0000 mg | Freq: Once | INTRAVENOUS | Status: DC
  Filled 2011-10-31: qty 100

## 2011-10-31 MED ORDER — DEXAMETHASONE SODIUM PHOSPHATE 4 MG/ML IJ SOLN
INTRAMUSCULAR | Status: DC | PRN
  Administered 2011-10-31: 4 mg via INTRAVENOUS

## 2011-10-31 SURGICAL SUPPLY — 35 items
ALCOHOL 70% 16 OZ (MISCELLANEOUS) ×2 IMPLANT
ATTRACTOMAT 16X20 MAGNETIC DRP (DRAPES) ×2 IMPLANT
BLADE SURG 15 STRL LF DISP TIS (BLADE) ×2 IMPLANT
BLADE SURG 15 STRL SS (BLADE) ×2
CLOTH BEACON ORANGE TIMEOUT ST (SAFETY) ×2 IMPLANT
COVER SURGICAL LIGHT HANDLE (MISCELLANEOUS) ×2 IMPLANT
CRADLE DONUT ADULT HEAD (MISCELLANEOUS) ×2 IMPLANT
GAUZE PACKING FOLDED 2  STR (GAUZE/BANDAGES/DRESSINGS) ×1
GAUZE PACKING FOLDED 2 STR (GAUZE/BANDAGES/DRESSINGS) ×1 IMPLANT
GAUZE SPONGE 4X4 16PLY XRAY LF (GAUZE/BANDAGES/DRESSINGS) ×2 IMPLANT
GLOVE SURG ORTHO 8.0 STRL STRW (GLOVE) ×2 IMPLANT
GLOVE SURG SS PI 6.5 STRL IVOR (GLOVE) ×2 IMPLANT
GOWN STRL REIN 3XL LVL4 (GOWN DISPOSABLE) ×2 IMPLANT
HEMOSTAT SURGICEL .5X2 ABSORB (HEMOSTASIS) IMPLANT
KIT BASIN OR (CUSTOM PROCEDURE TRAY) ×2 IMPLANT
KIT ROOM TURNOVER OR (KITS) ×2 IMPLANT
MANIFOLD NEPTUNE WASTE (CANNULA) ×2 IMPLANT
NEEDLE BLUNT 16X1.5 OR ONLY (NEEDLE) ×2 IMPLANT
NEEDLE DENTAL 27 LONG (NEEDLE) IMPLANT
NS IRRIG 1000ML POUR BTL (IV SOLUTION) ×2 IMPLANT
PACK EENT II TURBAN DRAPE (CUSTOM PROCEDURE TRAY) ×2 IMPLANT
PAD ARMBOARD 7.5X6 YLW CONV (MISCELLANEOUS) ×2 IMPLANT
SPONGE GAUZE 4X4 12PLY (GAUZE/BANDAGES/DRESSINGS) ×2 IMPLANT
SPONGE SURGIFOAM ABS GEL 100 (HEMOSTASIS) ×2 IMPLANT
SPONGE SURGIFOAM ABS GEL 12-7 (HEMOSTASIS) IMPLANT
SPONGE SURGIFOAM ABS GEL SZ50 (HEMOSTASIS) IMPLANT
SUCTION FRAZIER TIP 10 FR DISP (SUCTIONS) ×2 IMPLANT
SUT CHROMIC 3 0 PS 2 (SUTURE) ×8 IMPLANT
SUT CHROMIC 4 0 P 3 18 (SUTURE) ×2 IMPLANT
SYR 50ML SLIP (SYRINGE) ×2 IMPLANT
TOWEL OR 17X24 6PK STRL BLUE (TOWEL DISPOSABLE) ×2 IMPLANT
TOWEL OR 17X26 10 PK STRL BLUE (TOWEL DISPOSABLE) ×2 IMPLANT
TUBE CONNECTING 12X1/4 (SUCTIONS) ×2 IMPLANT
WATER STERILE IRR 1000ML POUR (IV SOLUTION) ×2 IMPLANT
YANKAUER SUCT BULB TIP NO VENT (SUCTIONS) ×2 IMPLANT

## 2011-10-31 NOTE — Discharge Instructions (Signed)
MOUTH CARE AFTER SURGERY ° °FACTS: °· Ice used in ice bag helps keep the swelling down, and can help lessen the pain. °· It is easier to treat pain BEFORE it happens. °· Spitting disturbs the clot and may cause bleeding to start again, or to get worse. °· Smoking delays healing and can cause complications. °· Sharing prescriptions can be dangerous.  Do not take medications not recently prescribed for you. °· Antibiotics may stop birth control pills from working.  Use other means of birth control while on antibiotics. °· Warm salt water rinses after the first 24 hours will help lessen the swelling:  Use 1/2 teaspoonful of table salt per oz.of water. ° °DO NOT: °· Do not spit.  Do not drink through a straw. °· Strongly advised not to smoke, dip snuff or chew tobacco at least for 3 days. °· Do not eat sharp or crunchy foods.  Avoid the area of surgery when chewing. °· Do not stop your antibiotics before your instructions say to do so. °· Do not eat hot foods until bleeding has stopped.  If you need to, let your food cool down to room temperature. ° °EXPECT: °· Some swelling, especially first 2-3 days. °· Soreness or discomfort in varying degrees.  Follow your dentist's instructions about how to handle pain before it starts. °· Pinkish saliva or light blood in saliva, or on your pillow in the morning.  This can last around 24 hours. °· Bruising inside or outside the mouth.  This may not show up until 2-3 days after surgery.  Don't worry, it will go away in time. °· Pieces of "bone" may work themselves loose.  It's OK.  If they bother you, let us know. ° °WHAT TO DO IMMEDIATELY AFTER SURGERY: °· Bite on the gauze with steady pressure for 1-2 hours.  Don't chew on the gauze. °· Do not lie down flat.  Raise your head support especially for the first 24 hours. °· Apply ice to your face on the side of the surgery.  You may apply it 20 minutes on and a few minutes off.  Ice for 8-12 hours.  You may use ice up to 24  hours. °· Before the numbness wears off, take a pain pill as instructed. °· Prescription pain medication is not always required. ° °SWELLING: °· Expect swelling for the first couple of days.  It should get better after that. °· If swelling increases 3 days or so after surgery; let us know as soon as possible. ° °FEVER: °· Take Tylenol every 4 hours if needed to lower your temperature, especially if it is at 100F or higher. °· Drink lots of fluids. °· If the fever does not go away, let us know. ° °BREATHING TROUBLE: °· Any unusual difficulty breathing means you have to have someone bring you to the emergency room ASAP ° °BLEEDING: °· Light oozing is expected for 24 hours or so. °· Prop head up with pillows °· Avoid spitting °· Do not confuse bright red fresh flowing blood with lots of saliva colored with a little bit of blood. °· If you notice some bleeding, place gauze or a tea bag where it is bleeding and apply CONSTANT pressure by biting down for 1 hour.  Avoid talking during this time.  Do not remove the gauze or tea bag during this hour to "check" the bleeding. °· If you notice bright RED bleeding FLOWING out of particular area, and filling the floor of your mouth, put   a wad of gauze on that area, bite down firmly and constantly.  Call us immediately.  If we're closed, have someone bring you to the emergency room.  ORAL HYGIENE:  Brush your teeth as usual after meals and before bedtime.  Use a soft toothbrush around the area of surgery.  DO NOT AVOID BRUSHING.  Otherwise bacteria(germs) will grow and may delay healing or encourage infection.  Since you cannot spit, just gently rinse and let the water flow out of your mouth.  DO NOT SWISH HARD.  EATING:  Cool liquids are a good point to start.  Increase to soft foods as tolerated.  PRESCRIPTIONS:  Follow the directions for your prescriptions exactly as written.  If Dr. Kristin Bruins gave you a narcotic pain medication, do not drive, operate  machinery or drink alcohol when on that medication.  QUESTIONS:  Call our office during office hours [phone] or call the Emergency Room at [phone].  Instructions Following General Anesthetic, Adult A nurse specialized in giving anesthesia (anesthetist) or a doctor specialized in giving anesthesia (anesthesiologist) gave you a medicine that made you sleep while a procedure was performed. For as long as 24 hours following this procedure, you may feel:  Dizzy.   Weak.   Drowsy.  AFTER THE PROCEDURE After surgery, you will be taken to the recovery area where a nurse will monitor your progress. You will be allowed to go home when you are awake, stable, taking fluids well, and without complications. For the first 24 hours following an anesthetic:  Have a responsible person with you.   Do not drive a car. If you are alone, do not take public transportation.   Do not drink alcohol.   Do not take medicine that has not been prescribed by your caregiver.   Do not sign important papers or make important decisions.   You may resume normal diet and activities as directed.   Change bandages (dressings) as directed.   Only take over-the-counter or prescription medicines for pain, discomfort, or fever as directed by your caregiver.  If you have questions or problems that seem related to the anesthetic, call the hospital and ask for the anesthetist or anesthesiologist on call. SEEK IMMEDIATE MEDICAL CARE IF:   You develop a rash.   You have difficulty breathing.   You have chest pain.   You develop any allergic problems.  Document Released: 07/28/2000 Document Revised: 04/10/2011 Document Reviewed: 03/08/2007 [facility] Patient Information 2012 [place], [place].

## 2011-10-31 NOTE — Anesthesia Preprocedure Evaluation (Addendum)
Anesthesia Evaluation  Patient identified by MRN, date of birth, ID band Patient awake    Reviewed: Allergy & Precautions, H&P , NPO status , Patient's Chart, lab work & pertinent test results, reviewed documented beta blocker date and time   History of Anesthesia Complications Negative for: history of anesthetic complications  Airway Mallampati: II TM Distance: >3 FB Neck ROM: full    Dental  (+) Poor Dentition, Chipped and Loose,    Pulmonary shortness of breath and with exertion, Current Smoker,          Cardiovascular hypertension, On Medications and On Home Beta Blockers + CAD, + Past MI (2009) and + Peripheral Vascular Disease     Neuro/Psych PSYCHIATRIC DISORDERS negative neurological ROS     GI/Hepatic negative GI ROS, Neg liver ROS,   Endo/Other  Diabetes mellitus-, Poorly Controlled, Insulin Dependent  Renal/GU negative Renal ROS  negative genitourinary   Musculoskeletal negative musculoskeletal ROS (+)   Abdominal   Peds  Hematology negative hematology ROS (+)   Anesthesia Other Findings See surgeon's H&P   Reproductive/Obstetrics negative OB ROS                          Anesthesia Physical Anesthesia Plan  ASA: III  Anesthesia Plan: General   Post-op Pain Management:    Induction: Intravenous  Airway Management Planned: Oral ETT  Additional Equipment:   Intra-op Plan:   Post-operative Plan: Extubation in OR  Informed Consent: I have reviewed the patients History and Physical, chart, labs and discussed the procedure including the risks, benefits and alternatives for the proposed anesthesia with the patient or authorized representative who has indicated his/her understanding and acceptance.   Dental Advisory Given  Plan Discussed with: CRNA and Surgeon  Anesthesia Plan Comments:         Anesthesia Quick Evaluation

## 2011-10-31 NOTE — Preoperative (Signed)
Beta Blockers   Reason not to administer Beta Blockers:Metoprolol 10/31/11 0600

## 2011-10-31 NOTE — Anesthesia Procedure Notes (Addendum)
Procedure Name: Intubation Date/Time: 10/31/2011 12:12 PM Performed by: Leona Singleton A Pre-anesthesia Checklist: Patient identified Patient Re-evaluated:Patient Re-evaluated prior to inductionOxygen Delivery Method: Circle system utilized Preoxygenation: Pre-oxygenation with 100% oxygen Intubation Type: IV induction Ventilation: Mask ventilation without difficulty Laryngoscope Size: Miller and 3 Grade View: Grade I Nasal Tubes: Left, Nasal prep performed, Nasal Rae and Magill forceps- large, utilized Tube size: 7.5 mm Number of attempts: 2 Placement Confirmation: ETT inserted through vocal cords under direct vision,  positive ETCO2 and breath sounds checked- equal and bilateral Tube secured with: Tape Dental Injury: Teeth and Oropharynx as per pre-operative assessment  Comments: Afrin to bilateral nares by pt. PreO2 x . EZ mask. ETT lubricated, pass with ease through left nare. DLx2, Grade 1 view. ETT tip free of debride. ETT advanced with large forceps. +ETCO2, =BBS. Atraumatic intubation, VSS.

## 2011-10-31 NOTE — Op Note (Signed)
Patient:            [name] Date of Birth:  [birth year] MRN:                [id number]   DATE OF PROCEDURE:  10/31/2011               OPERATIVE REPORT   PREOPERATIVE DIAGNOSES: 1. Coronary artery disease 2. Peripheral arterial disease 3. Chronic apical periodontitis 4. Chronic periodontitis 5. Multiple retained root segments 6. Dental caries  POSTOPERATIVE DIAGNOSES: 1. Coronary artery disease 2. Peripheral arterial disease 3. Chronic apical periodontitis 4. Chronic periodontitis 5. Multiple retained root segments 6. Dental caries  OPERATIONS: 1. Multiple extraction of tooth numbers 2, 4, 5, 6, 7, 8, 9, 10, 11, 12, 13, 15, 21, 22, 23, 24, 25, 26, 27, 28, 29, and 31 2. 4 Quadrants of alveoloplasty   SURGEON: Charlynne Pander, DDS  ASSISTANT: Zettie Pho, (dental assistant)  ANESTHESIA: General anesthesia via nasoendotracheal tube.  MEDICATIONS: 1. Ancef 2 g IV prior to invasive dental procedures. 2. Local anesthesia with a total utilization of 3 carpules each containing 34 mg of lidocaine with 0.017 mg of epinephrine as well as 4 carpules each containing 9 mg of bupivacaine with 0.009 mg of epinephrine.  SPECIMENS: There are 22 teeth that were discarded.  DRAINS: None  CULTURES: None  COMPLICATIONS: None   ESTIMATED BLOOD LOSS: 150 mLs.  INTRAVENOUS FLUIDS: 1800 mLs of Lactated ringers solution.  INDICATIONS: The patient was recently diagnosed with coronary artery disease and peripheral artery disease.  A dental consultation was then requested to rule out dental infection that may affect the patient's systemic health.  The patient was examined and treatment planned for multiple extractions with alveoloplasty and pre-prosthetic surgery as indicated in the operating room.    OPERATIVE FINDINGS: Patient was examined operating room number 8.  The teeth were identified for extraction. The patient was noted be affected by chronic periodontitis, chronic  apical periodontitis, dental caries, multiple retained root segments.   DESCRIPTION OF PROCEDURE: Patient was brought to the main operating room number 8. Patient was then placed in the supine position on the operating table. General Anesthesia was then induced per the anesthesia team. The patient was then prepped and draped in the usual manner for dental medicine procedure. A timeout was performed. The patient was identified and procedures were verified. A throat pack was placed at this time. The oral cavity was then thoroughly examined with the findings noted above. The patient was then ready for dental medicine procedure as follows:  Local anesthesia was then administered sequentially with a total utilization of 3 carpules each containing 34 mg of lidocaine with 0.017 mg of epinephrine as well as 4 carpules  each containing 9 mg bupivacaine with 0.009 mg of epinephrine.  The Maxillary left and right quadrants first approached. Anesthesia was then delivered utilizing infiltration with lidocaine with epinephrine. A #15 blade incision was then made from the distal of #2 and extended to the distal of #15.  A  surgical flap was then carefully reflected. Appropriate amounts of buccal and interseptal bone were then removed utilizing a surgical handpiece and bur and copious amounts of sterile saline.  The teeth were then subluxated with a series of straight elevators. Tooth numbers 4, 5, 6, 7, 8, 9, 10, 11, 12, 13 were then removed with a 150 forceps.  Tooth #2 was removed with a 53R forceps and tooth #15 was removed with a 53L forceps, both  without complications. Alveoloplasty was then performed utilizing a ronguers and bone file. The surgical site was then irrigated with copious amounts of sterile saline. The tissues were approximated and trimmed appropriately. A piece of Surgifoam was then placed in the maxillary extraction socket as indicated. The maxillary right surgical site was then closed from the distal  of #2 and extended the mesial #8 utilizing 3-0 chromic gut suture in a continuous interrupted suture technique x1.  The maxillary left surgical site was then closed from the distal of #15 extended to the mesial #9 utilizing 3-0 chromic gut suture in a continuous interrupted suture technique x1.  3 interrupted sutures were then placed to further closed surgical site in interrupted fashion with 3-0 chromic gut material. Please Note: Impacted tooth #'s 1 and 16 were not visualized during the procedure.  At this point time, the mandibular quadrants were approached. The patient was given bilateral inferior alveolar nerve blocks and long buccal nerve blocks utilizing the bupivacaine with epinephrine. Further infiltration was then achieved utilizing the bupivacaine with epinephrine. A 15 blade incision was then made from the distal of number 19 and extended to the distal of #32.  A surgical flap was then carefully reflected. Appropriate amounts of buccal and interseptal bone were then removed appropriately. Tooth numbers  21, 22, 23, 24, 25, 26, 27, 28, and 29 were then removed with a 151 forceps without complications. Tooth #31 was then removed with a 23 forceps without complications. Alveoloplasty was then performed utilizing a rongeurs and bone file. The tissues were approximated and trimmed appropriately. The surgical sites were then irrigated with copious amounts of sterile saline. The mandibular left surgical site was then closed from the distal of #19 and extended to the mesial numbers 24 utilizing 3-0 chromic gut suture in a continuous interrupted suture technique x1. The mandibular right surgical site was then closed from the distal of #32 and extended the mesial #25 utilizing 3-0 chromic gut suture in a continuous interrupted suture technique x1. 5 interrupted sutures utilizing 3-0 chromic gut material were then placed to further close the surgical site as indicated.  At this point time, the entire mouth was  irrigated with copious amounts of sterile saline. The patient was exam for complications, seeing none, the dental medicine procedure was deemed to be complete. The throat pack was removed at this time. A series of 4 x 4 gauze were placed in the mouth to aid hemostasis. The patient was then handed over to the anesthesia team for final disposition. After an appropriate amount of time, the patient was extubated and taken to the postanesthsia care unit with stable vital signs and a good condition. All counts were correct for the dental medicine procedure. The patient is to use Amicar 5% oral rinse postoperatively. Patient is to rinse with 10 mLs every hour for the next 10 hours to assist in maintenance of the clots and to prevent further oral ooze. Patient is to rinse and spit out excess. Patient is not to swallow this medication.   Charlynne Pander, DDS.

## 2011-10-31 NOTE — Transfer of Care (Signed)
Immediate Anesthesia Transfer of Care Note  Patient: [name]  Procedure(s) Performed: Procedure(s) (LRB): MULTIPLE EXTRACION WITH ALVEOLOPLASTY (N/A)  Patient Location: PACU  Anesthesia Type: General  Level of Consciousness: awake  Airway & Oxygen Therapy: Patient Spontanous Breathing, Patient connected to nasal cannula oxygen and Patient connected to face mask oxygen  Post-op Assessment: Report given to PACU RN, Post -op Vital signs reviewed and stable and Patient moving all extremities  Post vital signs: Reviewed and stable  Complications: No apparent anesthesia complications

## 2011-10-31 NOTE — Anesthesia Postprocedure Evaluation (Signed)
Anesthesia Post Note  Patient: [name]  Procedure(s) Performed: Procedure(s) (LRB): MULTIPLE EXTRACION WITH ALVEOLOPLASTY (N/A)  Anesthesia type: General  Patient location: PACU  Post pain: Pain level controlled  Post assessment: Patient's Cardiovascular Status Stable  Last Vitals:  Filed Vitals:   10/31/11 1453  BP: 135/84  Pulse: 89  Temp:   Resp: 18    Post vital signs: Reviewed and stable  Level of consciousness: alert  Complications: No apparent anesthesia complications

## 2011-10-31 NOTE — Progress Notes (Signed)
PRE-OPERATIVE NOTE:  10/31/2011 [name] [id number]  VITALS: BP 119/80  Pulse 86  Temp 98.5 F (36.9 C) (Oral)  Resp 18  SpO2 97% Lab Results  Component Value Date   WBC 9.1 10/28/2011   HGB 16.9 10/28/2011   HCT 46.6 10/28/2011   MCV 94.1 10/28/2011   PLT 211 10/28/2011   BMET    Component Value Date/Time   NA 132* 10/28/2011 0846   K 4.8 10/28/2011 0846   CL 98 10/28/2011 0846   CO2 22 10/28/2011 0846   GLUCOSE 284* 10/28/2011 0846   BUN 13 10/28/2011 0846   CREATININE 0.98 10/28/2011 0846   CALCIUM 10.4 10/28/2011 0846   GFRNONAA >90 10/28/2011 0846   GFRAA >90 10/28/2011 0846   Lab Results  Component Value Date   HGBA1C 7.9* 10/16/2011   Lab Results  Component Value Date   INR 1.0 ratio 06/11/2009   INR 0.9 10/13/2007   No results found for this basename: PTT   [name]  Presents for dental procedures in the Operating Room. Patient denies any acute or medical changes. Patient acccepts potential risks and complications and agrees to proceed with treatment as planned.  SUBJECTIVE: Patient denies any acute dental changes.  EXAM: No acute changes noted since consultation examination  ASSESSMENT: Patient has chronic periodontitis, accretions, multiple dental caries, multiple retained root segments, and presence of multiple periapical radiolucencies in pathology.   PLAN: Proceed with dental procedures in operating room today.  Charlynne Pander, DDS

## 2011-11-03 ENCOUNTER — Encounter (HOSPITAL_COMMUNITY): Payer: Self-pay | Admitting: Dentistry

## 2011-11-10 ENCOUNTER — Ambulatory Visit: Payer: 59 | Admitting: Surgery

## 2011-11-10 ENCOUNTER — Ambulatory Visit (HOSPITAL_COMMUNITY): Payer: Self-pay | Admitting: Dentistry

## 2011-11-10 ENCOUNTER — Encounter (HOSPITAL_COMMUNITY): Payer: Self-pay | Admitting: Dentistry

## 2011-11-10 VITALS — BP 117/73 | HR 76 | Temp 98.4°F

## 2011-11-10 DIAGNOSIS — K011 Impacted teeth: Secondary | ICD-10-CM

## 2011-11-10 DIAGNOSIS — K08109 Complete loss of teeth, unspecified cause, unspecified class: Secondary | ICD-10-CM

## 2011-11-10 DIAGNOSIS — K08199 Complete loss of teeth due to other specified cause, unspecified class: Secondary | ICD-10-CM

## 2011-11-10 NOTE — Progress Notes (Signed)
POST OPERATIVE NOTE:  11/10/2011 [name] [id number]  VITALS: BP 117/73  Pulse 76  Temp 98.4 F (36.9 C) (Oral)  [name]  is status post extraction of multiple teeth with alveoloplasty and pre-prosthetic surgery as indicated.  SUBJECTIVE: Patient with minimal complaints. Patient indicates that stitches are still in his mouth.  EXAM: No sign of infection, heme, or ooze. Generalized primary closure is noted. Slight delayed healing noted in the mandibular anterior area. Sutures are loosely intact. Sutures removed without complications or bleeding after 30 second chlorhexidine rinse.  ASSESSMENT: Post operative course is consistent with dental procedures performed. In the operating room on 10/31/11.  PLAN: 1. Continue warm water rinses as needed to aid healing. 2. Brush tongue twice a day. 3. Call Dr. Shirlee Latch to find out when he can start Plavix. 4. RTC in one month to evaluate healing and start upper and lower complete dentures as indicated. Quote for dentures provided today.    Charlynne Pander, DDS

## 2011-11-11 ENCOUNTER — Telehealth: Payer: Self-pay | Admitting: *Deleted

## 2011-11-11 NOTE — Telephone Encounter (Signed)
Please have him restart Plavix. ----- Message ----- From: Charlynne Pander, DDS Sent: 11/10/2011 3:25 PM To: Laurey Morale, MD [place]: Please let patient know when it's ok to start Plavix. He is cleared to start dentally. Dian Queen [phone]  11/11/11 pt aware he should restart Plavix.

## 2011-11-14 ENCOUNTER — Encounter: Payer: Self-pay | Admitting: Surgery

## 2011-11-17 ENCOUNTER — Encounter: Payer: Self-pay | Admitting: Surgery

## 2011-11-17 ENCOUNTER — Ambulatory Visit (INDEPENDENT_AMBULATORY_CARE_PROVIDER_SITE_OTHER): Payer: Medicare HMO | Admitting: Surgery

## 2011-11-17 ENCOUNTER — Encounter (INDEPENDENT_AMBULATORY_CARE_PROVIDER_SITE_OTHER): Payer: Medicare HMO | Admitting: *Deleted

## 2011-11-17 VITALS — BP 107/68 | HR 89 | Temp 98.6°F | Resp 16 | Ht 69.5 in | Wt 193.9 lb

## 2011-11-17 DIAGNOSIS — I739 Peripheral vascular disease, unspecified: Secondary | ICD-10-CM

## 2011-11-17 HISTORY — DX: Peripheral vascular disease, unspecified: I73.9

## 2011-11-17 NOTE — Progress Notes (Signed)
[facility] [place]   Patient name: [name] MRN: [id number] DOB: Oct 07, 1955 Sex: male     Chief Complaint  Patient presents with  . PVD    work up prior to BPG surgery  with lab work today- Ref. by Dr.McLean    HISTORY OF PRESENT ILLNESS: The patient is back today for followup of his lower extremity arterial insufficiency. I last saw him over a year ago. At that time he was having lifestyle limiting claudication in his right leg. Angiography revealed an occluded superficial femoral artery with reconstitution at the knee. We were planning to proceed with right leg bypass however this never transpired. The patient states that his symptoms have been stable  last saw him. He does not have ulceration. He denies rest pain. He states that he has pain in his calf with minimal activity by that his symptoms have not changed in the last year.  Past Medical History  Diagnosis Date  . Atherosclerosis of native arteries of the extremities with rest pain   . Coronary atherosclerosis of native coronary artery   . PAD (peripheral artery disease)   . DM type 2 (diabetes mellitus, type 2)   . Other and unspecified hyperlipidemia   . Unspecified essential hypertension   . Other peripheral vascular disease   . Tobacco use disorder   . H/O alcohol abuse   . H/O: obesity     lost 100lbs  . Non Hodgkin's lymphoma     in remission  . Shortness of breath     with exertion  . Myocardial infarction June 9,2009    Past Surgical History  Procedure Date  . Cardiac catheterization     2011  . Leg surgery     on bone above ankle-not sure which bone left leg 1981  . Multiple extractions with alveoloplasty 10/31/2011    Procedure: MULTIPLE EXTRACION WITH ALVEOLOPLASTY;  Surgeon: Charlynne Pander, DDS;  Location: [facility] OR;  Service: Oral Surgery;  Laterality: N/A;  Extraction of tooth #'s 2, 4,5,6,7,8,9,10,11,12,13,15,21,22,23,24,25,26,27,28, 29, and 31 with alveoloplasty  . Leg  surgery 1981    Bone cyst- Left leg    History   Social History  . Marital Status: Married    Spouse Name: N/A    Number of Children: N/A  . Years of Education: N/A   Occupational History  .      Disabled. Former Investment banker, operational.   Social History Main Topics  . Smoking status: Current Everyday Smoker -- 1.0 packs/day for 43 years    Types: Cigarettes  . Smokeless tobacco: Never Used  . Alcohol Use: No     former heavy user  . Drug Use: No  . Sexually Active: Not on file   Other Topics Concern  . Not on file   Social History Narrative   The patient is married. The patient has no children.Patient with a history of smoking one pack per day for 43 years. Patient started smoking at the age of 34.Patient no longer drinks alcohol. Patient quit proximally one and a half years ago from a previously heavy alcohol use.    Family History  Problem Relation Age of Onset  . Heart attack Mother     29's  . Heart disease Mother     Heart disease before age 63  . Hypertension Mother   . Hyperlipidemia Mother   . Other Father     pad  . Hypertension Father   . Hyperlipidemia Father   .  Peripheral vascular disease Father   . Heart attack      40's/40's    Allergies as of 11/17/2011 - Review Complete 11/17/2011  Allergen Reaction Noted  . Actos (pioglitazone)  10/13/2011  . Chlorhexidine gluconate Rash 10/31/2011  . Soap Other (See Comments) 10/28/2011    Current Outpatient Prescriptions on File Prior to Visit  Medication Sig Dispense Refill  . aspirin EC 81 MG tablet Take 1 tablet (81 mg total) by mouth daily.      Marland Kitchen atorvastatin (LIPITOR) 20 MG tablet Take 1 tablet (20 mg total) by mouth daily.  90 tablet  3  . clopidogrel (PLAVIX) 75 MG tablet Take 1 tablet (75 mg total) by mouth daily.      . fenofibrate (TRICOR) 145 MG tablet Take 1 tablet (145 mg total) by mouth daily.  90 tablet  3  . glimepiride (AMARYL) 4 MG tablet Take 1 tablet (4 mg total) by mouth daily before breakfast.  30  tablet  3  . glucose blood (FREESTYLE LITE) test strip 1 each by Other route daily. And lancets 1/day 250.72  100 each  3  . lisinopril (PRINIVIL,ZESTRIL) 40 MG tablet Take 40 mg by mouth daily.      . metFORMIN (GLUCOPHAGE-XR) 500 MG 24 hr tablet Take 1,000 mg by mouth 2 (two) times daily. TAKE TWO TABLETS BY MOUTH TWICE DAILY      . metoprolol (LOPRESSOR) 50 MG tablet Take 1 tablet (50 mg total) by mouth 2 (two) times daily.  180 tablet  3  . amoxicillin (AMOXIL) 500 MG tablet Take 500 mg by mouth 3 (three) times daily.      Marland Kitchen atorvastatin (LIPITOR) 20 MG tablet Take 20 mg by mouth at bedtime.      . Diphenhydramine-Acetaminophen (PERCOGESIC PO) Take 2 tablets by mouth 2 (two) times daily as needed. For dental pain         REVIEW OF SYSTEMS: Cardiovascular: No chest pain, chest pressure, palpitations, orthopnea, or dyspnea on exertion. No claudication or rest pain,  No history of DVT or phlebitis. Pulmonary: No productive cough, asthma or wheezing. Neurologic: No weakness, paresthesias, aphasia, or amaurosis. No dizziness. Hematologic: No bleeding problems or clotting disorders. Musculoskeletal: No joint pain or joint swelling. Gastrointestinal: No blood in stool or hematemesis Genitourinary: No dysuria or hematuria. Psychiatric:: No history of major depression. Integumentary: No rashes or ulcers. Constitutional: No fever or chills.  PHYSICAL EXAMINATION:   Vital signs are BP 107/68  Pulse 89  Temp 98.6 F (37 C) (Oral)  Resp 16  Ht 5' 9.5" (1.765 m)  Wt 193 lb 14.4 oz (87.952 kg)  BMI 28.22 kg/m2  SpO2 95% General: The patient appears their stated age. HEENT:  No gross abnormalities Pulmonary:  Non labored breathing Musculoskeletal: There are no major deformities. Neurologic: No focal weakness or paresthesias are detected, Skin: There are no ulcer or rashes noted. Psychiatric: The patient has normal affect. Cardiovascular: There is a regular rate and rhythm.  Pedal pulses  are not palpable   Diagnostic Stud2ies I have reviewed his ultrasound. His ankle-brachial indices remain stable. The right is 0.56 the left is 0.72. Previously there is 0.50 and 0.66  Assessment: Peripheral vascular disease, right greater than left Plan: I discussed with the patient that the absolute indication for revascularization would be an intractable pain or a nonhealing wound, neither of which he has. I discussed that we could proceed with bypass but if he has been able to tolerate his symptoms we  should likely wait until his symptoms become more severe. I have encouraged him to continue to maintain his feet and make sure that he does everything he can to prevent getting a wound. He knows to call me should he develop a nonhealing wound. I scheduled him to come by to see me in one year with repeat ankle-brachial indices. He knows to call me if he gets to the point where he can no longer tolerate his claudication symptoms.  Jorge Ny, M.D. [facility] [place] Office: [phone] Pager:  [phone]

## 2011-12-03 ENCOUNTER — Encounter (HOSPITAL_COMMUNITY): Payer: Self-pay | Admitting: Dentistry

## 2011-12-03 ENCOUNTER — Ambulatory Visit (HOSPITAL_COMMUNITY): Payer: Self-pay | Admitting: Dentistry

## 2011-12-03 VITALS — BP 131/81 | HR 92

## 2011-12-03 DIAGNOSIS — K08109 Complete loss of teeth, unspecified cause, unspecified class: Secondary | ICD-10-CM

## 2011-12-03 DIAGNOSIS — T148XXD Other injury of unspecified body region, subsequent encounter: Secondary | ICD-10-CM

## 2011-12-03 DIAGNOSIS — K011 Impacted teeth: Secondary | ICD-10-CM

## 2011-12-03 DIAGNOSIS — Z463 Encounter for fitting and adjustment of dental prosthetic device: Secondary | ICD-10-CM

## 2011-12-03 NOTE — Progress Notes (Signed)
12/03/2011  Patient:            [name] Date of Birth:  1955/05/07 MRN:                [id number]  BP 131/81  Pulse 92   [name] presents for evaluation for start of upper and lower denture fabrication. Patient is without dental complaints.  Exam: Patient is edentulous with exception of the impacted tooth numbers 1 and 16. Patient is healing in by both primary closure and secondary intention. Several maxillary sites of delayed healing are noted. Patient performed Valsalva maneuver with no evidence of oral fistula. Patient denies symptoms of sinus problems. Healing is acceptable but suggest additional 2 weeks before performing final impressions. Orthopantogram was obtained to evaluate to physician's and possible sinus involvement. No apparent changes into position was noted. No apparent sinus involvement was noted.   Discussed procedures involved in upper and lower denture fabrication and prognosis for successful ability to wear dentures. Price for dentures confirmed.  Patient agrees to proceed with upper and lower denture fabrication. Procedure:  Upper and lower denture primary impressions in alginate. Lab pour. To [facility] for upper and lower denture custom tray fabrication. RTC for upper and lower denture final impressions as scheduled. Call if problems with healing arise before then. Charlynne Pander 12/03/2011

## 2011-12-15 ENCOUNTER — Other Ambulatory Visit: Payer: Self-pay | Admitting: Cardiology

## 2011-12-15 DIAGNOSIS — I739 Peripheral vascular disease, unspecified: Secondary | ICD-10-CM

## 2011-12-16 ENCOUNTER — Encounter: Payer: Self-pay | Admitting: Cardiology

## 2011-12-16 ENCOUNTER — Ambulatory Visit (HOSPITAL_COMMUNITY): Payer: Self-pay | Admitting: Dentistry

## 2011-12-16 ENCOUNTER — Encounter (INDEPENDENT_AMBULATORY_CARE_PROVIDER_SITE_OTHER): Payer: Medicare HMO

## 2011-12-16 ENCOUNTER — Encounter (HOSPITAL_COMMUNITY): Payer: Self-pay | Admitting: Dentistry

## 2011-12-16 VITALS — BP 120/84 | HR 87 | Temp 98.3°F

## 2011-12-16 DIAGNOSIS — R0989 Other specified symptoms and signs involving the circulatory and respiratory systems: Secondary | ICD-10-CM

## 2011-12-16 DIAGNOSIS — Z463 Encounter for fitting and adjustment of dental prosthetic device: Secondary | ICD-10-CM

## 2011-12-16 DIAGNOSIS — I739 Peripheral vascular disease, unspecified: Secondary | ICD-10-CM

## 2011-12-16 DIAGNOSIS — K08109 Complete loss of teeth, unspecified cause, unspecified class: Secondary | ICD-10-CM

## 2011-12-16 NOTE — Progress Notes (Signed)
12/16/2011  Patient:            [name] Date of Birth:  07/25/1955 MRN:                [id number]  BP 120/84  Pulse 87  Temp 98.3 F (36.8 C) (Oral)  [name] presents for continued upper and lower complete denture fabrication. Procedure:  Upper and lower border molding and final impressions in Aquasil. Patient tolerated procedure well. To [facility] for custom baseplates with rims. Return to clinic for upper and lower complete denture jaw relations. Charlynne Pander

## 2011-12-22 ENCOUNTER — Other Ambulatory Visit: Payer: Self-pay | Admitting: Cardiology

## 2011-12-22 DIAGNOSIS — I739 Peripheral vascular disease, unspecified: Secondary | ICD-10-CM

## 2011-12-23 ENCOUNTER — Other Ambulatory Visit: Payer: 59

## 2011-12-23 ENCOUNTER — Encounter (INDEPENDENT_AMBULATORY_CARE_PROVIDER_SITE_OTHER): Payer: Medicare HMO

## 2011-12-23 DIAGNOSIS — R0989 Other specified symptoms and signs involving the circulatory and respiratory systems: Secondary | ICD-10-CM

## 2011-12-23 DIAGNOSIS — I739 Peripheral vascular disease, unspecified: Secondary | ICD-10-CM

## 2011-12-25 ENCOUNTER — Ambulatory Visit (HOSPITAL_COMMUNITY): Payer: Self-pay | Admitting: Dentistry

## 2011-12-25 VITALS — BP 134/72 | HR 78 | Temp 98.8°F

## 2011-12-25 DIAGNOSIS — Z463 Encounter for fitting and adjustment of dental prosthetic device: Secondary | ICD-10-CM

## 2011-12-25 DIAGNOSIS — K08109 Complete loss of teeth, unspecified cause, unspecified class: Secondary | ICD-10-CM

## 2011-12-25 NOTE — Progress Notes (Signed)
12/25/2011  Patient:            [name] Date of Birth:  06/21/1955 MRN:                [id number]  BP 134/72  Pulse 78  Temp 98.8 F (37.1 C) (Oral)  [name] presents for continued denture fabrication. Procedure:  Upper and lower denture Jaw relations with aluwax bite registration. Patient agrees to tooth selection of 22 Ef, H, 10 degree posteriors to match with Portrait A2 shade. Patient tolerated procedure well. RTC for denture wax try in.   Charlynne Pander, DDS

## 2012-01-07 ENCOUNTER — Telehealth: Payer: Self-pay | Admitting: Cardiology

## 2012-01-07 ENCOUNTER — Ambulatory Visit (HOSPITAL_COMMUNITY): Payer: Self-pay | Admitting: Dentistry

## 2012-01-07 VITALS — BP 129/78 | HR 88 | Temp 98.5°F

## 2012-01-07 DIAGNOSIS — Z463 Encounter for fitting and adjustment of dental prosthetic device: Secondary | ICD-10-CM

## 2012-01-07 DIAGNOSIS — K08109 Complete loss of teeth, unspecified cause, unspecified class: Secondary | ICD-10-CM

## 2012-01-07 NOTE — Patient Instructions (Signed)
Return to clinic for denture insertion

## 2012-01-07 NOTE — Telephone Encounter (Signed)
Please return call to patient at [phone] regarding medical care.

## 2012-01-07 NOTE — Progress Notes (Signed)
01/07/2012  Patient:            [name] Date of Birth:  1955/05/11 MRN:                [id number]  BP 129/78  Pulse 88  Temp 98.5 F (36.9 C) (Oral)  [name] presents for continued upper and lower denture fabrication. Procedure:   Upper and lower denture wax tryin. Patient accepts esthetics, phonetics, fit and function. Patient agrees to process "as is" in [place] [address]. Patient to RTC for  upper and lower denture insertion.  Charlynne Pander 01/07/2012

## 2012-01-07 NOTE — Telephone Encounter (Signed)
LMTCB

## 2012-01-09 ENCOUNTER — Encounter: Payer: Self-pay | Admitting: Internal Medicine

## 2012-01-09 ENCOUNTER — Ambulatory Visit (INDEPENDENT_AMBULATORY_CARE_PROVIDER_SITE_OTHER): Payer: Medicare HMO | Admitting: Internal Medicine

## 2012-01-09 VITALS — BP 142/82 | HR 84 | Temp 98.6°F | Ht 69.0 in | Wt 197.0 lb

## 2012-01-09 DIAGNOSIS — F17200 Nicotine dependence, unspecified, uncomplicated: Secondary | ICD-10-CM

## 2012-01-09 DIAGNOSIS — Z0000 Encounter for general adult medical examination without abnormal findings: Secondary | ICD-10-CM

## 2012-01-09 DIAGNOSIS — Z23 Encounter for immunization: Secondary | ICD-10-CM

## 2012-01-09 DIAGNOSIS — E785 Hyperlipidemia, unspecified: Secondary | ICD-10-CM

## 2012-01-09 DIAGNOSIS — E119 Type 2 diabetes mellitus without complications: Secondary | ICD-10-CM

## 2012-01-09 DIAGNOSIS — I10 Essential (primary) hypertension: Secondary | ICD-10-CM

## 2012-01-09 DIAGNOSIS — Z1211 Encounter for screening for malignant neoplasm of colon: Secondary | ICD-10-CM

## 2012-01-09 MED ORDER — VARENICLINE TARTRATE 1 MG PO TABS: 1.0000 mg | ORAL_TABLET | Freq: Two times a day (BID) | ORAL | Status: DC

## 2012-01-09 NOTE — Assessment & Plan Note (Signed)
BP Readings from Last 3 Encounters:  01/09/12 142/82  01/07/12 129/78  12/25/11 134/72   The current medical regimen is generally effective;  continue present plan and medications.

## 2012-01-09 NOTE — Progress Notes (Signed)
Subjective:    Patient ID: [name], male    DOB: [birth year], 56 y.o.   MRN: [id number]  HPI New pt to me but known to our division/group - cards/endo - here to establish with new PCP  Also here for medicare wellness  Diet: heart healthy and diabetic (carb modified) Physical activity: sedentary - uses a cane Depression/mood screen: negative Hearing: intact to whispered voice Visual acuity: grossly normal, performs annual eye exam  ADLs: capable Fall risk: none Home safety: good Cognitive evaluation: intact to orientation, naming, recall and repetition EOL planning: adv directives, full code/ I agree  I have personally reviewed and have noted 1. The patient's medical and social history 2. Their use of alcohol, tobacco or illicit drugs 3. Their current medications and supplements 4. The patient's functional ability including ADL's, fall risks, home safety risks and hearing or visual impairment. 5. Diet and physical activities 6. Evidence for depression or mood disorders  Also reviewed chronic medical issues -  DM2 - on Amaryl and metformin - check cbgs at home, no hypoglycemic events - prior actos caused SE and felt Januvia "did nothing" and was $ prohibitive  PAD - BLE claudication pain - works with cards on same - s/p vasc eval 12/2011 ([facility]) - recommended against fem pop at this time or other revascularization  hypertension - If you develop worsening symptoms or fever, call and we can reconsider antibiotics, but it does not appear necessary to use antibiotics at this time.  dyslipidemia - on atorva - the patient reports compliance with medication(s) as prescribed. Denies adverse side effects.   Past Medical History  Diagnosis Date  . Atherosclerosis of native arteries of the extremities with rest pain   . Coronary atherosclerosis of native coronary artery   . PAD (peripheral artery disease)   . DM type 2 (diabetes mellitus, type 2)   . Other and unspecified  hyperlipidemia   . Unspecified essential hypertension   . Tobacco use disorder   . H/O alcohol abuse     quit 2011 - dry since that time  . H/O: obesity     lost 100lbs  . Non Hodgkin's lymphoma 2000    tx with XRT at [facility], in remission  . Myocardial infarction June 9,2009    Family History  Problem Relation Age of Onset  . Heart attack Mother     40's  . Heart disease Mother     Heart disease before age 74  . Hypertension Mother   . Hyperlipidemia Mother   . Other Father     pad  . Hypertension Father   . Hyperlipidemia Father   . Peripheral vascular disease Father   . Heart attack      40's/40's   History  Substance Use Topics  . Smoking status: Current Everyday Smoker -- 1.0 packs/day for 43 years    Types: Cigarettes  . Smokeless tobacco: Never Used  . Alcohol Use: No     former heavy user   Review of Systems Constitutional: Negative for fever or weight change.  Respiratory: Negative for cough and shortness of breath.   Cardiovascular: Negative for chest pain or palpitations.  Gastrointestinal: Negative for abdominal pain, no bowel changes.  Musculoskeletal: Negative for gait problem or joint swelling.  Skin: Negative for rash.  Neurological: Negative for dizziness or headache.  No other specific complaints in a complete review of systems (except as listed in HPI above).     Objective:   Physical Exam  BP 142/82  Pulse 84  Temp 98.6 F (37 C) (Oral)  Ht 5\' 9"  (1.753 m)  Wt 197 lb (89.359 kg)  BMI 29.09 kg/m2  SpO2 97% Weight: 197 lb (89.359 kg)  Constitutional:  He appears well-developed and well-nourished. No distress. HENT - NCAT, TMs clear without effusion or erythema - edentulous. OP mild erythema but clear Eyes: PERRL - EOMI - no icterus - vision grossly intact Neck: Normal range of motion. Neck supple. No JVD present. No thyromegaly present.  Cardiovascular: Normal rate, regular rhythm and normal heart sounds.  No murmur heard. no BLE  edema Pulmonary/Chest: Effort normal and breath sounds normal. No respiratory distress. no wheezes.  Abdominal: Soft. Bowel sounds are normal. Patient exhibits no distension. There is no tenderness.  Musculoskeletal: Normal range of motion. Patient exhibits no edema.  Neurological: he is alert and oriented to person, place, and time. No cranial nerve deficit. Coordination normal.  Skin: Skin is warm and dry.  No erythema or ulceration.  Psychiatric: he has a normal mood and affect. behavior is normal. Judgment and thought content normal.   Lab Results  Component Value Date   WBC 9.1 10/28/2011   HGB 16.9 10/28/2011   HCT 46.6 10/28/2011   PLT 211 10/28/2011   GLUCOSE 284* 10/28/2011   CHOL 190 10/13/2011   TRIG 539.0* 10/13/2011   HDL 32.90* 10/13/2011   LDLDIRECT 84.1 10/13/2011   LDLCALC  Value: 111        Total Cholesterol/HDL:CHD Risk Coronary Heart Disease Risk Table                     Men   Women  1/2 Average Risk   3.4   3.3* 10/13/2007   ALT 16 10/28/2011   AST 16 10/28/2011   NA 132* 10/28/2011   K 4.8 10/28/2011   CL 98 10/28/2011   CREATININE 0.98 10/28/2011   BUN 13 10/28/2011   CO2 22 10/28/2011   TSH 1.10 10/16/2011   PSA 0.61 10/16/2011   INR 1.0 ratio 06/11/2009   HGBA1C 7.9* 10/16/2011   MICROALBUR 1.5 10/16/2011        Assessment & Plan:   AWV/v70.0 - Today patient counseled on age appropriate routine health concerns for screening and prevention, each reviewed and up to date or declined. Immunizations reviewed and up to date or declined. Labs/ECG reviewed. Risk factors for depression reviewed and negative. Hearing function and visual acuity are intact. ADLs screened and addressed as needed. Functional ability and level of safety reviewed and appropriate. Education, counseling and referrals performed based on assessed risks today. Patient provided with a copy of personalized plan for preventive services.  Refer for colo screen but will need to coordinate with cards study re:  holding antiplt meds/study med  Also See problem list. Medications and labs reviewed today.

## 2012-01-09 NOTE — Assessment & Plan Note (Signed)
Edema with Actos (SE) and Januvia ineffective +$$ prohib On amaryl + metformin Plans to follow with me (PCP) rather than endo On ASA, statin and ACEI The current medical regimen is effective;  continue present plan and medications. Lab Results  Component Value Date   HGBA1C 7.9* 10/16/2011    

## 2012-01-09 NOTE — Patient Instructions (Signed)
It was good to see you today. We have reviewed your prior records including labs and tests today Health Maintenance reviewed - Tetanus booster today and refer for colon screening- all recommended immunizations and age-appropriate screenings are up-to-date. we'll make referral to gastroenterology for colon screening . Our office will contact you regarding appointment(s) once made. Medications reviewed and updated - start Chantix as discussed - no other changes at this time. Your prescription(s) have been submitted to your pharmacy. Please take as directed and contact our office if you believe you are having problem(s) with the medication(s). Please schedule followup in 3 months for diabetes mellitus, call sooner if problems. Health Maintenance, Males A healthy lifestyle and preventative care can promote health and wellness.  Maintain regular health, dental, and eye exams.   Eat a healthy diet. Foods like vegetables, fruits, whole grains, low-fat dairy products, and lean protein foods contain the nutrients you need without too many calories. Decrease your intake of foods high in solid fats, added sugars, and salt. Get information about a proper diet from your caregiver, if necessary.   Regular physical exercise is one of the most important things you can do for your health. Most adults should get at least 150 minutes of moderate-intensity exercise (any activity that increases your heart rate and causes you to sweat) each week. In addition, most adults need muscle-strengthening exercises on 2 or more days a week.     Maintain a healthy weight. The body mass index (BMI) is a screening tool to identify possible weight problems. It provides an estimate of body fat based on height and weight. Your caregiver can help determine your BMI, and can help you achieve or maintain a healthy weight. For adults 20 years and older:   A BMI below 18.5 is considered underweight.   A BMI of 18.5 to 24.9 is normal.    A BMI of 25 to 29.9 is considered overweight.   A BMI of 30 and above is considered obese.   Maintain normal blood lipids and cholesterol by exercising and minimizing your intake of saturated fat. Eat a balanced diet with plenty of fruits and vegetables. Blood tests for lipids and cholesterol should begin at age 106 and be repeated every 5 years. If your lipid or cholesterol levels are high, you are over 50, or you are a high risk for heart disease, you may need your cholesterol levels checked more frequently. Ongoing high lipid and cholesterol levels should be treated with medicines, if diet and exercise are not effective.   If you smoke, find out from your caregiver how to quit. If you do not use tobacco, do not start.   If you choose to drink alcohol, do not exceed 2 drinks per day. One drink is considered to be 12 ounces (355 mL) of beer, 5 ounces (148 mL) of wine, or 1.5 ounces (44 mL) of liquor.   Avoid use of street drugs. Do not share needles with anyone. Ask for help if you need support or instructions about stopping the use of drugs.   High blood pressure causes heart disease and increases the risk of stroke. Blood pressure should be checked at least every 1 to 2 years. Ongoing high blood pressure should be treated with medicines if weight loss and exercise are not effective.   If you are 66 to 57 years old, ask your caregiver if you should take aspirin to prevent heart disease.   Diabetes screening involves taking a blood sample to check your  fasting blood sugar level. This should be done once every 3 years, after age 72, if you are within normal weight and without risk factors for diabetes. Testing should be considered at a younger age or be carried out more frequently if you are overweight and have at least 1 risk factor for diabetes.   Colorectal cancer can be detected and often prevented. Most routine colorectal cancer screening begins at the age of 50 and continues through age  14. However, your caregiver may recommend screening at an earlier age if you have risk factors for colon cancer. On a yearly basis, your caregiver may provide home test kits to check for hidden blood in the stool. Use of a small camera at the end of a tube, to directly examine the colon (sigmoidoscopy or colonoscopy), can detect the earliest forms of colorectal cancer. Talk to your caregiver about this at age 44, when routine screening begins. Direct examination of the colon should be repeated every 5 to 10 years through age 30, unless early forms of pre-cancerous polyps or small growths are found.   Hepatitis C blood testing is recommended for all people born from [birth year] through 1966 and any individual with known risks for hepatitis C.   Healthy men should no longer receive prostate-specific antigen (PSA) blood tests as part of routine cancer screening. Consult with your caregiver about prostate cancer screening.   Testicular cancer screening is not recommended for adolescents or adult males who have no symptoms. Screening includes self-exam, caregiver exam, and other screening tests. Consult with your caregiver about any symptoms you have or any concerns you have about testicular cancer.   Practice safe sex. Use condoms and avoid high-risk sexual practices to reduce the spread of sexually transmitted infections (STIs).   Use sunscreen with a sun protection factor (SPF) of 30 or greater. Apply sunscreen liberally and repeatedly throughout the day. You should seek shade when your shadow is shorter than you. Protect yourself by wearing long sleeves, pants, a wide-brimmed hat, and sunglasses year round, whenever you are outdoors.   Notify your caregiver of new moles or changes in moles, especially if there is a change in shape or color. Also notify your caregiver if a mole is larger than the size of a pencil eraser.   A one-time screening for abdominal aortic aneurysm (AAA) and surgical repair of large  AAAs by sound wave imaging (ultrasonography) is recommended for ages 61 to 73 years who are current or former smokers.   Stay current with your immunizations.  Document Released: 10/18/2007 Document Revised: 04/10/2011 Document Reviewed: 09/16/2010 [facility] Patient Information 2012 [place], [place]. You Can Quit Smoking If you are ready to quit smoking or are thinking about it, congratulations! You have chosen to help yourself be healthier and live longer! There are lots of different ways to quit smoking. Nicotine gum, nicotine patches, a nicotine inhaler, or nicotine nasal spray can help with physical craving. Hypnosis, support groups, and medicines help break the habit of smoking. TIPS TO GET OFF AND STAY OFF CIGARETTES  Learn to predict your moods. Do not let a bad situation be your excuse to have a cigarette. Some situations in your life might tempt you to have a cigarette.   Ask friends and co-workers not to smoke around you.   Make your home smoke-free.   Never have "just one" cigarette. It leads to wanting another and another. Remind yourself of your decision to quit.   On a card, make a list of your  reasons for not smoking. Read it at least the same number of times a day as you have a cigarette. Tell yourself everyday, "I do not want to smoke. I choose not to smoke."   Ask someone at home or work to help you with your plan to quit smoking.   Have something planned after you eat or have a cup of coffee. Take a walk or get other exercise to perk you up. This will help to keep you from overeating.   Try a relaxation exercise to calm you down and decrease your stress. Remember, you may be tense and nervous the first two weeks after you quit. This will pass.   Find new activities to keep your hands busy. Play with a pen, coin, or rubber band. Doodle or draw things on paper.   Brush your teeth right after eating. This will help cut down the craving for the taste of tobacco after meals.  You can try mouthwash too.   Try gum, breath mints, or diet candy to keep something in your mouth.  IF YOU SMOKE AND WANT TO QUIT:  Do not stock up on cigarettes. Never buy a carton. Wait until one pack is finished before you buy another.   Never carry cigarettes with you at work or at home.   Keep cigarettes as far away from you as possible. Leave them with someone else.   Never carry matches or a lighter with you.   Ask yourself, "Do I need this cigarette or is this just a reflex?"   Bet with someone that you can quit. Put cigarette money in a piggy bank every morning. If you smoke, you give up the money. If you do not smoke, by the end of the week, you keep the money.   Keep trying. It takes 21 days to change a habit!   Talk to your doctor about using medicines to help you quit. These include nicotine replacement gum, lozenges, or skin patches.  Document Released: 02/15/2009 Document Revised: 04/10/2011 Document Reviewed: 02/15/2009 [facility] Patient Information 2012 [place], [place].

## 2012-01-09 NOTE — Assessment & Plan Note (Signed)
5 minutes today spent counseling patient on unhealthy effects of continued tobacco abuse and encouragement of cessation including medical options available to help the patient quit smoking. Chantix rx today

## 2012-01-09 NOTE — Assessment & Plan Note (Signed)
High TGs - no longer on Lovaza due to cost Continue atorva - last lipids reviewed Follow with cards as ongoing

## 2012-01-15 ENCOUNTER — Ambulatory Visit (HOSPITAL_COMMUNITY): Payer: Self-pay | Admitting: Dentistry

## 2012-01-15 ENCOUNTER — Encounter (HOSPITAL_COMMUNITY): Payer: Self-pay | Admitting: Dentistry

## 2012-01-15 VITALS — BP 136/84 | HR 113 | Temp 98.2°F

## 2012-01-15 DIAGNOSIS — Z463 Encounter for fitting and adjustment of dental prosthetic device: Secondary | ICD-10-CM

## 2012-01-15 DIAGNOSIS — K08109 Complete loss of teeth, unspecified cause, unspecified class: Secondary | ICD-10-CM

## 2012-01-15 NOTE — Patient Instructions (Signed)
Instructions for Denture Use and Care  Congratulations, you are on the way to oral rehabilitation!  You have just received a new set of complete or partial dentures.  These prostheses will help to improve both your appearance and chewing ability.  These instructions will help you get adjusted to your dentures as well as care for them properly.  Please read these instructions carefully and completely as soon as you get home.  If you or your caregiver have any questions please notify the [facility] at [phone].  HOW YOUR DENTURES LOOK AND FEEL Soon after you begin wearing your dentures, you may feel that your dentures are too large or even loose.  As our mouth and facial muscles become accustomed to the dentures, these feelings will go away.  You also may feel that you are salivating more than you normally do.  This feeling should go away as you get used to having the dentures in your mouth.  You may bite your cheek or your tongue; this will eventually resolve itself as you wear your dentures.  Some soreness is to be expected, but you should not hurt.  If your mouth hurts, call your dentist.  A denture adhesive may occasionally be necessary to hold your dentures in place more securely.  The dentist will let you know when one is recommended for you.  SPEAKING Wearing dentures will change the sound of your voice initially.  This will be noticed by you more than anyone else.  Bite and swallow before you speak, in order to place your dentures in position so that you may speak more clearly.  Practice speaking by reading aloud or counting from 1 to 100 very slowly and distinctly.  After some practice your mouth will become accustomed to your dentures and you will speak more clearly.  EATING Chewing will definitely be different after you receive your dentures.  With a little practice and patience you should be able to eat just about any kind of food.  Begin by eating small quantities of food  that are cut into small pieces.  Star with soft foods such as eggs, cooked vegetables, or puddings.  As you gain confidence advance  Your diet to whatever texture foods you can tolerate.  DENTURE CARE Dentures can collect plaque and calculus much the same as natural teeth can.  If not removed on a regular basis, your dentures will not look or feel clean, and you will experience denture odor.  It is very important that you remove your dentures at bedtime and clean them thoroughly.  You should: 1. Clean your dentures over a sink full of water so if dropped, breakage will be prevented. 2. Rinse your dentures with cool water to remove any large food particles. 3. Use soap and water or a denture cleanser or paste to clean the dentures.  Do not use regular toothpaste as it may abrade the denture base or teeth. 4. Use a moistened denture brush to clean all surfaces (inside and outside). 5. Rinse thoroughly to remove any remaining soap or denture cleanser. 6. Use a soft bristle toothbrush to gently brush any natural teeth, gums, tongue, and palate at bedtime and before reinserting your dentures. 7. Do not sleep with your dentures in your mouth at night.  Remove your dentures and soak them overnight in a denture cup filled with water or denture solution as recommended by your dentist.  This routine will become second nature and will increase the life and comfort   of your dentures.  Please do not try to adjust these dentures yourself; you could damage them.  FOLLOW-UP You should call or make an appointment with your dentist.  Your dentist would like to see you at least once a year for a check-up and examination. 

## 2012-01-15 NOTE — Progress Notes (Signed)
01/15/2012  Patient:            [name] Date of Birth:  1956/02/03 MRN:                [id number]  BP 136/84  Pulse 113  Temp 98.2 F (36.8 C) (Oral)  [name] presents for insertion of upper and lower complete dentures. Procedure: Pressure indicating paste applied to dentures. Adjustments made as needed. [place]. Occlusion evaluated and adjustments made as needed for Centric Relation and protrusive strokes. Good esthetics, phonetics, fit, and function noted. Patient accepts results. Post op instructions provided in written and verbal formats on use and care of dentures. Gave patient denture brush and cup. Patient to keep dentures out if sore spots develop. Use salt water rinses as needed to aid healing. Return to clinic as scheduled for denture adjustment.  Call if problems arise before then. Patient dismissed in stable condition. Charlynne Pander

## 2012-01-16 DIAGNOSIS — K1370 Unspecified lesions of oral mucosa: Secondary | ICD-10-CM

## 2012-01-16 DIAGNOSIS — K000 Anodontia: Secondary | ICD-10-CM

## 2012-01-16 NOTE — Telephone Encounter (Signed)
F/u  plz return call to patient regarding dental work deemed medically necessary by Dr. Shirlee Latch, he can be reached at [phone]

## 2012-01-16 NOTE — Telephone Encounter (Signed)
Spoke with pt. Pt states he has received information from insurance. He states claim for dental surgery has been denied.

## 2012-01-16 NOTE — Telephone Encounter (Signed)
Pt states he has an appt with Dr Kristin Bruins on Monday and will check with his office about authorization for dental surgery. Pt to let me know if any information is needed from Dr Shirlee Latch.

## 2012-01-19 ENCOUNTER — Ambulatory Visit (HOSPITAL_COMMUNITY): Payer: Self-pay | Admitting: Dentistry

## 2012-01-19 ENCOUNTER — Encounter (HOSPITAL_COMMUNITY): Payer: Self-pay | Admitting: Dentistry

## 2012-01-19 VITALS — BP 134/83 | HR 81 | Temp 98.3°F

## 2012-01-19 DIAGNOSIS — Z463 Encounter for fitting and adjustment of dental prosthetic device: Secondary | ICD-10-CM

## 2012-01-19 DIAGNOSIS — K08109 Complete loss of teeth, unspecified cause, unspecified class: Secondary | ICD-10-CM

## 2012-01-19 DIAGNOSIS — K062 Gingival and edentulous alveolar ridge lesions associated with trauma: Secondary | ICD-10-CM

## 2012-01-19 NOTE — Patient Instructions (Signed)
Return for denture adjustment as scheduled.

## 2012-01-19 NOTE — Progress Notes (Signed)
01/19/2012  Patient:            [name] Date of Birth:  1955-05-10 MRN:                [id number]  BP 134/83  Pulse 81  Temp 98.3 F (36.8 C) (Oral)  [name] presents for  adjustment of recently inserted dentures. Subjective: Patient is complaining of denture irritation to the mandibular anterior area. Patient also indicates that the upper denture may be" too far back". Exam: There is only slight erythema involving the mandibular anterior area. This is in the area of loose mucosa. Procedure: Pressure indicating paste applied to dentures. Adjustments made as needed. [place]. Occlusion evaluated and adjustments made as needed for Centric Relation and protrusive strokes. Patient accepts results. Patient to keep dentures out if sore spots develop. Use salt water rinses as needed to aid healing. Return to clinic as scheduled for denture adjustment.  Call if problems arise before then. Patient dismissed in stable condition.   Charlynne Pander, DDS

## 2012-01-20 ENCOUNTER — Other Ambulatory Visit: Payer: Self-pay | Admitting: *Deleted

## 2012-01-28 ENCOUNTER — Other Ambulatory Visit: Payer: Self-pay | Admitting: General Practice

## 2012-01-28 MED ORDER — GLUCOSE BLOOD VI STRP: 1.0000 | ORAL_STRIP | Freq: Every day | Status: DC

## 2012-02-02 ENCOUNTER — Other Ambulatory Visit: Payer: Self-pay | Admitting: Cardiology

## 2012-02-02 NOTE — Telephone Encounter (Signed)
New problem:  1. Does he need blood work prior to appt on 10/18.   2.  refill request  pack of chantix

## 2012-02-02 NOTE — Telephone Encounter (Signed)
Spoke with pt. Pt had lipid done recently for research. He will bring a copy to his appt with Dr Shirlee Latch in October. He will call Dr Felicity Coyer about Chantix-she originally prescribed.

## 2012-02-03 ENCOUNTER — Other Ambulatory Visit: Payer: Self-pay | Admitting: Internal Medicine

## 2012-02-03 MED ORDER — VARENICLINE TARTRATE 1 MG PO TABS: 1.0000 mg | ORAL_TABLET | Freq: Two times a day (BID) | ORAL | Status: AC

## 2012-02-03 MED ORDER — GLIMEPIRIDE 4 MG PO TABS: 4.0000 mg | ORAL_TABLET | Freq: Every day | ORAL | Status: DC

## 2012-02-03 MED ORDER — METFORMIN HCL ER 500 MG PO TB24: ORAL_TABLET | ORAL | Status: DC

## 2012-02-03 MED ORDER — VARENICLINE TARTRATE 0.5 MG X 11 & 1 MG X 42 PO MISC: ORAL | Status: DC

## 2012-02-03 NOTE — Telephone Encounter (Signed)
Spoke with pt, rx for Metformin and Glimepiride sent to [facility]. Okay to sent rx for Chantix starter pack to [facility]?

## 2012-02-03 NOTE — Telephone Encounter (Signed)
Yes, I believe this has been done

## 2012-02-03 NOTE — Telephone Encounter (Signed)
Caller: [place]/Patient; Patient Name: [name]; PCP: Rene Paci (Adults only); Best Callback Phone Number: [phone]: Denies need for triage is calling about medication refills.   Patient is calling about several medications.  Chantix:  [facility] (mail order pharmacy) sent to patient on continuing refills.  Patient needs starter pack for Chantix-please call into [facility] on [address] at [phone].  He and his wife are anxious to get started.  Also, Dr. Everardo All had been refilling patient's, but will no longer be doing so.  Glucophage-XR 500 MG Take 1,000 mg by mouth 2 (two) times daily and Glimepiride (Tab) AL 4 MG Take 1 tablet (4 mg total) by mouth daily before breakfast need to be called into (90 day supply to [facility] at [phone].  Patient has about 20 pills left of each medication.  Last appointment was on 01/09/12.  Please note that patient states he is now allergic to Tricor.    OFFICE:  PLEASE FOLLOW UP WITH PATIENT ON REFILLS FOR CHANTIX STARTER PACK AT [facility] ON [address] AND REFILLS TO MAIL ORDER TO [facility] FOR GLUCOPHAGE AND METFORMIN.  ALSO NOTE PATIENT IS ALLERGIC TO TRICOR. THANKS

## 2012-02-03 NOTE — Telephone Encounter (Signed)
Rx for Chantix started pack sent to [facility] as requesting, pt informed all rx's sent to designated pharmacy.

## 2012-02-04 ENCOUNTER — Other Ambulatory Visit: Payer: Self-pay | Admitting: *Deleted

## 2012-02-04 ENCOUNTER — Encounter (HOSPITAL_COMMUNITY): Payer: Self-pay | Admitting: Dentistry

## 2012-02-04 ENCOUNTER — Ambulatory Visit (HOSPITAL_COMMUNITY): Payer: Self-pay | Admitting: Dentistry

## 2012-02-04 VITALS — BP 122/77 | HR 76 | Temp 98.6°F

## 2012-02-04 DIAGNOSIS — Z463 Encounter for fitting and adjustment of dental prosthetic device: Secondary | ICD-10-CM

## 2012-02-04 DIAGNOSIS — K062 Gingival and edentulous alveolar ridge lesions associated with trauma: Secondary | ICD-10-CM

## 2012-02-04 DIAGNOSIS — K1370 Unspecified lesions of oral mucosa: Secondary | ICD-10-CM

## 2012-02-04 DIAGNOSIS — K08109 Complete loss of teeth, unspecified cause, unspecified class: Secondary | ICD-10-CM

## 2012-02-04 MED ORDER — TRUEPLUS LANCETS 28G MISC: 1.0000 | Freq: Two times a day (BID) | Status: DC

## 2012-02-04 MED ORDER — GLUCOSE BLOOD VI STRP: ORAL_STRIP | Status: DC

## 2012-02-04 NOTE — Telephone Encounter (Signed)
R'cd fax from [facility] pharmacy for refill of Truetest test strips and lancets.

## 2012-02-04 NOTE — Patient Instructions (Signed)
Return to clinic for appointment as scheduled. Call if problem arise before then.

## 2012-02-04 NOTE — Progress Notes (Signed)
02/04/2012  Patient:            [name] Date of Birth:  24-Dec-1955 MRN:                [id number]  BP 122/77  Pulse 76  Temp 98.6 F (37 C) (Oral)  [name] presents for  adjustment of recently inserted dentures. Subjective: Patient is complaining of denture irritation to the mandibular anterior area. Exam: There is only slight erythema involving the mandibular anterior area of #27.  Procedure: Pressure indicating paste applied to dentures. Adjustments made as needed. [place]. Occlusion evaluated and adjustments made as needed for Centric Relation and protrusive strokes. Patient accepts results. Patient to keep dentures out if sore spots develop. Use salt water rinses as needed to aid healing. Return to clinic as scheduled for denture adjustment.  Call if problems arise before then. Patient dismissed in stable condition.   Charlynne Pander, DDS

## 2012-02-12 ENCOUNTER — Telehealth: Payer: Self-pay | Admitting: Cardiology

## 2012-02-12 NOTE — Telephone Encounter (Signed)
Spoke with pt about research labs done mid-August.

## 2012-02-12 NOTE — Telephone Encounter (Signed)
Pt returning nurse call, he can be reached at [phone]

## 2012-02-20 ENCOUNTER — Ambulatory Visit (INDEPENDENT_AMBULATORY_CARE_PROVIDER_SITE_OTHER): Payer: Medicare HMO | Admitting: Cardiology

## 2012-02-20 ENCOUNTER — Encounter: Payer: Self-pay | Admitting: Cardiology

## 2012-02-20 VITALS — BP 128/83 | HR 76 | Ht 69.0 in | Wt 195.0 lb

## 2012-02-20 DIAGNOSIS — F17200 Nicotine dependence, unspecified, uncomplicated: Secondary | ICD-10-CM

## 2012-02-20 DIAGNOSIS — I2510 Atherosclerotic heart disease of native coronary artery without angina pectoris: Secondary | ICD-10-CM

## 2012-02-20 DIAGNOSIS — I7389 Other specified peripheral vascular diseases: Secondary | ICD-10-CM

## 2012-02-20 DIAGNOSIS — E785 Hyperlipidemia, unspecified: Secondary | ICD-10-CM

## 2012-02-20 LAB — LIPID PANEL: Cholesterol: 209 mg/dL — ABNORMAL HIGH (ref 0–200)

## 2012-02-20 LAB — HEPATIC FUNCTION PANEL
AST: 21 U/L (ref 0–37)
Albumin: 4.4 g/dL (ref 3.5–5.2)
Alkaline Phosphatase: 46 U/L (ref 39–117)
Total Protein: 7.5 g/dL (ref 6.0–8.3)

## 2012-02-20 MED ORDER — OMEGA-3-ACID ETHYL ESTERS 1 G PO CAPS: 2.0000 g | ORAL_CAPSULE | Freq: Two times a day (BID) | ORAL | Status: DC

## 2012-02-20 NOTE — Progress Notes (Signed)
Patient ID: [name], male   DOB: 1955-05-30, 56 y.o.   MRN: [id number] PCP: [name]  56 yo with history of CAD, severe PAD, and diabetes presents for followup. He had NSTEMI in 6/09 that was treated medically as patient refused catheterization due to financial issues. He also has severe claudication. Patient had catheterization in 2/11 showing totally occluded circumflex artery with some collaterals as well as severe PAD (see PMH for description).  He has seen Dr. Myra Marshall and plan for now is observation with the option of fem-pop bypass down the road.   Patient denies any recent chest pain.  Main complaint is right leg/foot pain after walking 10-20 feet.  He does not have any ulcers on his feet.  This is stable.  He quit smoking about a week ago.  He denies exertional dyspnea but is not very active.  He was on fenofibrate for high triglycerides but stopped it due to pain in his knees and ankles.  Weight is stable.   Labs (3/11): HDL 20 => 36, LDL 72, TGs 4098 => 448  Labs (2/11): K 4.4, creatinine 0.8  Labs (6/13): TGs 539, HDL 33, LDL 84, K 4.7, creatinine 1.2  Allergies (verified):  No Known Drug Allergies   Past Medical History:  1. Non-Hodgkin lymphoma. This was treated about 10 years ago with radiation, it is now in remission. He had it in his axillary area and inguinal areas.  2. Formerly morbidly obese, but lost 100 pounds by diet and exercise.  3. Hypertension.  4. Hypercholesterolemia.  Unable to tolerate fenofibrate.  5. History of alcohol abuse.  6. Current smoker. He smokes a pack a day. ? COPD.  7. Coronary artery disease. The patient did have a non-ST-elevation MI in June 2009 that was treated medically. The patient refused LHC at that time. LHC done 2/11 showed totally occluded CFX with faint R->L collaterals, 40-50% proximal LAD, patent RCA. Renal arteries were patent.  8. Echocardiogram done in June 2009 showed an EF of 60%, mild LVH, no regional wall motion  abnormalities.  9. PAD. He underwent ABIs and lower extremity duplex scanning on February 18, 2008. This demonstrated occlusion of the distal right superficial femoral artery with reconstitution at the level of the popliteal artery. There appeared to be some degree of inflow disease because of biphasic common femoral waveforms. The ABI was 0.49 on the right and 0.87 on the left. Peripheral angiogram (2/11) showed 50% L CIA stenosis, 90% stenosis of L SFA and popliteal, totally occluded R SFA with reconstitution of popliteal. Fem-pop bypass would be option => he has seen Dr. Myra Marshall, would operate for pain at rest or tissue ischemia.   10. Type II diabetes: Peripheral edema with Actos.   Family History:  The patient's mother had a myocardial infarction in her 27s. He had 2 aunts with myocardial infarctions in their 102s. His father had peripheral arterial disease. No diabetes in his immediate family.   Social History:  The patient lives with his wife. He is an unemployed Investment banker, operational, originally from [place]. He has no children. He quit smoking in 10/13. He used to drink heavily but has quit. He is on disability.   Review of Systems  All systems reviewed and negative except as per HPI.   Current Outpatient Prescriptions  Medication Sig Dispense Refill  . atorvastatin (LIPITOR) 20 MG tablet Take 1 tablet (20 mg total) by mouth daily.  90 tablet  3  . cilostazol (PLETAL) 100 MG tablet Take  100 mg by mouth Twice daily.      . Diphenhydramine-Acetaminophen (PERCOGESIC PO) Take 2 tablets by mouth 2 (two) times daily as needed. For dental pain      . glimepiride (AMARYL) 4 MG tablet Take 1 tablet (4 mg total) by mouth daily before breakfast.  90 tablet  2  . glucose blood (TRUETEST TEST) test strip Use as instructed to check blood sugar twice daily dx 250.00  200 each  3  . lisinopril (PRINIVIL,ZESTRIL) 40 MG tablet Take 40 mg by mouth daily.      . metFORMIN (GLUCOPHAGE-XR) 500 MG 24 hr tablet TAKE TWO  TABLETS BY MOUTH TWICE DAILY  360 tablet  2  . metoprolol (LOPRESSOR) 50 MG tablet Take 1 tablet (50 mg total) by mouth 2 (two) times daily.  180 tablet  3  . TRUEPLUS LANCETS 28G MISC 1 each by Does not apply route 2 (two) times daily. Dx 250.00  200 each  3  . varenicline (CHANTIX CONTINUING MONTH PAK) 1 MG tablet Take 1 tablet (1 mg total) by mouth 2 (two) times daily.  180 tablet  1  . varenicline (CHANTIX STARTING MONTH PAK) 0.5 MG X 11 & 1 MG X 42 tablet Take one 0.5 mg tablet by mouth once daily for 3 days, then increase to one 0.5 mg tablet twice daily for 4 days, then increase to one 1 mg tablet twice daily.  53 tablet  0  . omega-3 acid ethyl esters (LOVAZA) 1 G capsule Take 2 capsules (2 g total) by mouth 2 (two) times daily.  360 capsule  3    BP 128/83  Pulse 76  Ht 5\' 9"  (1.753 m)  Wt 195 lb (88.451 kg)  BMI 28.80 kg/m2 General: NAD Neck: No JVD, no thyromegaly or thyroid nodule.  Lungs: Distant breath sounds.  CV: Nondisplaced PMI.  Heart regular S1/S2, no S3/S4, no murmur.  No peripheral edema.  No carotid bruit.  Unable to palpate DP or PT pulses on either leg. Abdomen: Soft, nontender, no hepatosplenomegaly, no distention.   Neurologic: Alert and oriented x 3.  Psych: Normal affect. Extremities: No clubbing or cyanosis.   Assessment/Plan:  CAD, NATIVE VESSEL  Known CAD with no recent chest pain. He is on a study drug, either Plavix or Brilinta. He will continue ACEI, beta blocker, and statin.  PVD WITH CLAUDICATION  Known severe PAD (see PMH). Symptoms stable, not at rest and no pedal ulcers. He has quit smoking. He is very limited by claudication.  He is on cilostazol and either Plavix or Brilinta (study drug).  He has seen Dr. Myra Marshall, fem-pop bypass on right would be option if symptoms become intolerable.  HYPERLIPIDEMIA-MIXED Goal LDL < 70. Check lipids/LFTs today. He has not been able to tolerate fenofibrate so I will have him take Lovaza 2 g bid instead.  TOBACCO  ABUSE  He quit last week using Chantix.   [name] 02/20/2012 3:47 PM

## 2012-02-20 NOTE — Patient Instructions (Addendum)
Start Lovaza 2 G two times a day. This will be two 1G capsules two times a day.  Your physician recommends that you return for a FASTING lipid profile /liver profile today.  Your physician wants you to follow-up in: 6 months with Dr Shirlee Latch. (April 2014) . You will receive a reminder letter in the mail two months in advance. If you don't receive a letter, please call our office to schedule the follow-up appointment.

## 2012-02-23 ENCOUNTER — Other Ambulatory Visit: Payer: Self-pay | Admitting: *Deleted

## 2012-02-23 DIAGNOSIS — E785 Hyperlipidemia, unspecified: Secondary | ICD-10-CM

## 2012-02-27 ENCOUNTER — Emergency Department (HOSPITAL_COMMUNITY)
Admission: EM | Admit: 2012-02-27 | Discharge: 2012-02-27 | Disposition: A | Discharge status: Home / Self Care | Payer: Medicare HMO | Attending: Emergency Medicine | Admitting: Emergency Medicine

## 2012-02-27 ENCOUNTER — Encounter (HOSPITAL_COMMUNITY): Payer: Self-pay | Admitting: Emergency Medicine

## 2012-02-27 DIAGNOSIS — I219 Acute myocardial infarction, unspecified: Secondary | ICD-10-CM | POA: Insufficient documentation

## 2012-02-27 DIAGNOSIS — M25559 Pain in unspecified hip: Secondary | ICD-10-CM

## 2012-02-27 DIAGNOSIS — I2510 Atherosclerotic heart disease of native coronary artery without angina pectoris: Secondary | ICD-10-CM | POA: Insufficient documentation

## 2012-02-27 DIAGNOSIS — I70229 Atherosclerosis of native arteries of extremities with rest pain, unspecified extremity: Secondary | ICD-10-CM | POA: Insufficient documentation

## 2012-02-27 DIAGNOSIS — E119 Type 2 diabetes mellitus without complications: Secondary | ICD-10-CM | POA: Insufficient documentation

## 2012-02-27 DIAGNOSIS — E785 Hyperlipidemia, unspecified: Secondary | ICD-10-CM | POA: Insufficient documentation

## 2012-02-27 DIAGNOSIS — I739 Peripheral vascular disease, unspecified: Secondary | ICD-10-CM | POA: Insufficient documentation

## 2012-02-27 DIAGNOSIS — Z87891 Personal history of nicotine dependence: Secondary | ICD-10-CM | POA: Insufficient documentation

## 2012-02-27 DIAGNOSIS — Z8679 Personal history of other diseases of the circulatory system: Secondary | ICD-10-CM | POA: Insufficient documentation

## 2012-02-27 DIAGNOSIS — Z79899 Other long term (current) drug therapy: Secondary | ICD-10-CM | POA: Insufficient documentation

## 2012-02-27 DIAGNOSIS — Z791 Long term (current) use of non-steroidal anti-inflammatories (NSAID): Secondary | ICD-10-CM | POA: Insufficient documentation

## 2012-02-27 DIAGNOSIS — I10 Essential (primary) hypertension: Secondary | ICD-10-CM | POA: Insufficient documentation

## 2012-02-27 DIAGNOSIS — C8589 Other specified types of non-Hodgkin lymphoma, extranodal and solid organ sites: Secondary | ICD-10-CM | POA: Insufficient documentation

## 2012-02-27 MED ORDER — DIAZEPAM 5 MG PO TABS: 5.0000 mg | ORAL_TABLET | Freq: Three times a day (TID) | ORAL | Status: DC | PRN

## 2012-02-27 MED ORDER — OXYCODONE-ACETAMINOPHEN 5-325 MG PO TABS: 1.0000 | ORAL_TABLET | ORAL | Status: DC | PRN

## 2012-02-27 MED ORDER — NAPROXEN 500 MG PO TABS: 500.0000 mg | ORAL_TABLET | Freq: Two times a day (BID) | ORAL | Status: DC | PRN

## 2012-02-27 NOTE — ED Notes (Signed)
Pt c/o hip pain. Pt sts he was seen last week by cardo MD. Rates pain a 6/10.VSS.PWD

## 2012-02-27 NOTE — ED Notes (Addendum)
Reports instructed to stop taking tricor (3 1/2 weeks ago) due to joint pain to toes, knees, lower back & now moved to right hip joint. Rated @ it's worse 9/10 now rated 4/10. Patient normally uses cane due to PVD. Patient has been using heating pad & sustained 2nd degree burn in the process of healing.

## 2012-03-02 NOTE — ED Provider Notes (Signed)
History    56yM with R hip pain. Gradual onset and progressively worsening. onset about a week ago. Denies acute trauma. Mild pain at rest and much worse with movement. Walks with cane at baseline. No numbness, tingling or loss of strength. No rash. No abdominal pain. No testicular pain. No urinary complaints.  CSN: [id number]  Arrival date & time 02/27/12  7829   First MD Initiated Contact with Patient 02/27/12 1009      Chief Complaint  Patient presents with  . Hip Pain    (Consider location/radiation/quality/duration/timing/severity/associated sxs/prior treatment) HPI  Past Medical History  Diagnosis Date  . Atherosclerosis of native arteries of the extremities with rest pain   . Coronary atherosclerosis of native coronary artery   . PAD (peripheral artery disease)   . DM type 2 (diabetes mellitus, type 2)   . Other and unspecified hyperlipidemia   . Unspecified essential hypertension   . Tobacco use disorder   . H/O alcohol abuse     quit 2011 - dry since that time  . H/O: obesity     lost 100lbs  . Non Hodgkin's lymphoma 2000    tx with XRT at [facility], in remission  . Myocardial infarction June 9,2009    Past Surgical History  Procedure Date  . Cardiac catheterization     2011  . Leg surgery     on bone above ankle-not sure which bone left leg 1981  . Multiple extractions with alveoloplasty 10/31/2011    Procedure: MULTIPLE EXTRACION WITH ALVEOLOPLASTY;  Surgeon: Charlynne Pander, DDS;  Location: [facility] OR;  Service: Oral Surgery;  Laterality: N/A;  Extraction of tooth #'s 2, 4,5,6,7,8,9,10,11,12,13,15,21,22,23,24,25,26,27,28, 29, and 31 with alveoloplasty  . Leg surgery 1981    Bone cyst- Left leg    Family History  Problem Relation Age of Onset  . Heart attack Mother     28's  . Heart disease Mother     Heart disease before age 22  . Hypertension Mother   . Hyperlipidemia Mother   . Other Father     pad  . Hypertension Father   . Hyperlipidemia  Father   . Peripheral vascular disease Father   . Heart attack      40's/40's    History  Substance Use Topics  . Smoking status: Former Smoker -- 1.0 packs/day for 43 years    Types: Cigarettes    Quit date: 02/14/2012  . Smokeless tobacco: Never Used  . Alcohol Use: No     former heavy user      Review of Systems   Review of symptoms negative unless otherwise noted in HPI.   Allergies  Actos; Tricor; Chlorhexidine gluconate; Orange oil; and Soap  Home Medications   Current Outpatient Rx  Name Route Sig Dispense Refill  . ATORVASTATIN CALCIUM 20 MG PO TABS Oral Take 1 tablet (20 mg total) by mouth daily. 90 tablet 3  . CILOSTAZOL 100 MG PO TABS Oral Take 100 mg by mouth 2 (two) times daily.     Marland Kitchen PERCOGESIC PO Oral Take 2 tablets by mouth 2 (two) times daily as needed. For dental pain    . GLIMEPIRIDE 4 MG PO TABS Oral Take 1 tablet (4 mg total) by mouth daily before breakfast. 90 tablet 2  . LISINOPRIL 40 MG PO TABS Oral Take 40 mg by mouth daily.    Marland Kitchen METFORMIN HCL ER 500 MG PO TB24 Oral Take 1,000 mg by mouth 2 (two) times daily.    Marland Kitchen  METOPROLOL TARTRATE 50 MG PO TABS Oral Take 50 mg by mouth 2 (two) times daily.    . OMEGA-3-ACID ETHYL ESTERS 1 G PO CAPS Oral Take 2 capsules (2 g total) by mouth 2 (two) times daily. 360 capsule 3  . VARENICLINE TARTRATE 1 MG PO TABS Oral Take 1 tablet (1 mg total) by mouth 2 (two) times daily. 180 tablet 1  . DIAZEPAM 5 MG PO TABS Oral Take 1 tablet (5 mg total) by mouth every 8 (eight) hours as needed (muscle spasm). 10 tablet 0  . GLUCOSE BLOOD VI STRP  Use as instructed to check blood sugar twice daily dx 250.00 200 each 3  . NAPROXEN 500 MG PO TABS Oral Take 1 tablet (500 mg total) by mouth 2 (two) times daily as needed. 20 tablet 0  . OXYCODONE-ACETAMINOPHEN 5-325 MG PO TABS Oral Take 1-2 tablets by mouth every 4 (four) hours as needed for pain. 12 tablet 0  . TRUEPLUS LANCETS 28G MISC Does not apply 1 each by Does not apply route  2 (two) times daily. Dx 250.00 200 each 3    BP 124/82  Pulse 92  Temp 98.2 F (36.8 C) (Oral)  Resp 20  SpO2 96%  Physical Exam  Nursing note and vitals reviewed. Constitutional: He appears well-developed and well-nourished. No distress.  HENT:  Head: Normocephalic and atraumatic.  Eyes: Conjunctivae normal are normal. Right eye exhibits no discharge. Left eye exhibits no discharge.  Neck: Neck supple.  Cardiovascular: Normal rate, regular rhythm and normal heart sounds.  Exam reveals no gallop and no friction rub.   No murmur heard. Pulmonary/Chest: Effort normal and breath sounds normal. No respiratory distress.  Abdominal: Soft. He exhibits no distension. There is no tenderness.  Musculoskeletal: He exhibits no edema and no tenderness.       Pain with ROM of R hip. NVI distally. No concerning skin lesions. No back tenderness.  Neurological: He is alert.  Skin: Skin is warm and dry.  Psychiatric: He has a normal mood and affect. His behavior is normal. Thought content normal.    ED Course  Procedures (including critical care time)  Labs Reviewed - No data to display No results found.   1. Hip pain       MDM  56ym with atraumatic hip pain. Suspect muscular strain or possibly DJD. Consider septic joint but doubt. Plan symptomatic tx. Return precautions dicussed. Outpt fu.        Raeford Razor, MD 03/03/12 [phone]

## 2012-03-08 ENCOUNTER — Other Ambulatory Visit: Payer: Self-pay | Admitting: *Deleted

## 2012-03-08 MED ORDER — AMBULATORY NON FORMULARY MEDICATION: Status: DC

## 2012-03-12 ENCOUNTER — Encounter: Payer: Self-pay | Admitting: Surgery

## 2012-03-12 ENCOUNTER — Other Ambulatory Visit: Payer: Self-pay | Admitting: *Deleted

## 2012-03-12 DIAGNOSIS — M79609 Pain in unspecified limb: Secondary | ICD-10-CM

## 2012-03-15 ENCOUNTER — Encounter (INDEPENDENT_AMBULATORY_CARE_PROVIDER_SITE_OTHER): Payer: Medicare HMO | Admitting: *Deleted

## 2012-03-15 ENCOUNTER — Encounter: Payer: Self-pay | Admitting: Surgery

## 2012-03-15 ENCOUNTER — Ambulatory Visit (INDEPENDENT_AMBULATORY_CARE_PROVIDER_SITE_OTHER): Payer: Medicare HMO | Admitting: Surgery

## 2012-03-15 VITALS — BP 156/86 | HR 91 | Resp 16 | Ht 69.0 in | Wt 200.0 lb

## 2012-03-15 DIAGNOSIS — M79609 Pain in unspecified limb: Secondary | ICD-10-CM

## 2012-03-15 DIAGNOSIS — I739 Peripheral vascular disease, unspecified: Secondary | ICD-10-CM

## 2012-03-15 NOTE — Progress Notes (Signed)
[facility] [place]   Patient name: [name] MRN: [id number] DOB: [birth year] Sex: male     Chief Complaint  Patient presents with  . PVD    severe hip pain X 1 month - ER @ [facility] 1 1/2 wks ago possible circulation problems    HISTORY OF PRESENT ILLNESS: The patient comes in today as an unscheduled visit because of relatively new onset of right hip pain. He states that it began at the end of a 3 month trial of TriCor. He initially thought it was a side effect of the medicine and stopped it. However, his pain did not go away. He describes it as worse with walking. He keeps him up at night. It is very sensitive to palpation. He was seen in the ER and given pain medication. No x-rays of this area were taken.  The patient states that this is separate from his claudication symptoms. He describes the symptoms as being stable.  Past Medical History  Diagnosis Date  . Atherosclerosis of native arteries of the extremities with rest pain   . Coronary atherosclerosis of native coronary artery   . PAD (peripheral artery disease)   . DM type 2 (diabetes mellitus, type 2)   . Other and unspecified hyperlipidemia   . Unspecified essential hypertension   . Tobacco use disorder   . H/O alcohol abuse     quit 2011 - dry since that time  . H/O: obesity     lost 100lbs  . Non Hodgkin's lymphoma 2000    tx with XRT at [facility], in remission  . Myocardial infarction June 9,2009    Past Surgical History  Procedure Date  . Cardiac catheterization     2011  . Leg surgery     on bone above ankle-not sure which bone left leg 1981  . Multiple extractions with alveoloplasty 10/31/2011    Procedure: MULTIPLE EXTRACION WITH ALVEOLOPLASTY;  Surgeon: Charlynne Pander, DDS;  Location: [facility] OR;  Service: Oral Surgery;  Laterality: N/A;  Extraction of tooth #'s 2, 4,5,6,7,8,9,10,11,12,13,15,21,22,23,24,25,26,27,28, 29, and 31 with alveoloplasty  . Leg surgery 1981    Bone cyst- Left  leg    History   Social History  . Marital Status: Married    Spouse Name: N/A    Number of Children: N/A  . Years of Education: N/A   Occupational History  .      Disabled. Former Investment banker, operational.   Social History Main Topics  . Smoking status: Former Smoker -- 1.0 packs/day for 43 years    Types: Cigarettes    Quit date: 02/14/2012  . Smokeless tobacco: Never Used  . Alcohol Use: No     Comment: former heavy user  . Drug Use: No  . Sexually Active: Not on file   Other Topics Concern  . Not on file   Social History Narrative   The patient is married. The patient has no children.Patient with a history of smoking one pack per day for 43 years. Patient started smoking at the age of 45.Patient no longer drinks alcohol. Patient quit proximally one and a half years ago from a previously heavy alcohol use.    Family History  Problem Relation Age of Onset  . Heart attack Mother     43's  . Heart disease Mother     Heart disease before age 49  . Hypertension Mother   . Hyperlipidemia Mother   . Other Father     pad  .  Hypertension Father   . Hyperlipidemia Father   . Peripheral vascular disease Father   . Heart attack      40's/40's    Allergies as of 03/15/2012 - Review Complete 03/15/2012  Allergen Reaction Noted  . Actos (pioglitazone)  10/13/2011  . Tricor (fenofibrate) Other (See Comments) 02/04/2012  . Chlorhexidine gluconate Rash 10/31/2011  . Orange oil Rash 01/19/2012  . Soap Other (See Comments) 10/28/2011    Current Outpatient Prescriptions on File Prior to Visit  Medication Sig Dispense Refill  . AMBULATORY NON FORMULARY MEDICATION Medication Name: EUCLID STUDY DRUG-Plavix vs Brilinta      . atorvastatin (LIPITOR) 20 MG tablet Take 1 tablet (20 mg total) by mouth daily.  90 tablet  3  . cilostazol (PLETAL) 100 MG tablet Take 100 mg by mouth 2 (two) times daily.       Marland Kitchen glimepiride (AMARYL) 4 MG tablet Take 1 tablet (4 mg total) by mouth daily before breakfast.   90 tablet  2  . glucose blood (TRUETEST TEST) test strip Use as instructed to check blood sugar twice daily dx 250.00  200 each  3  . lisinopril (PRINIVIL,ZESTRIL) 40 MG tablet Take 40 mg by mouth daily.      . metFORMIN (GLUCOPHAGE-XR) 500 MG 24 hr tablet Take 1,000 mg by mouth 2 (two) times daily.      . metoprolol (LOPRESSOR) 50 MG tablet Take 50 mg by mouth 2 (two) times daily.      Marland Kitchen omega-3 acid ethyl esters (LOVAZA) 1 G capsule Take 2 capsules (2 g total) by mouth 2 (two) times daily.  360 capsule  3  . TRUEPLUS LANCETS 28G MISC 1 each by Does not apply route 2 (two) times daily. Dx 250.00  200 each  3  . varenicline (CHANTIX CONTINUING MONTH PAK) 1 MG tablet Take 1 tablet (1 mg total) by mouth 2 (two) times daily.  180 tablet  1  . diazepam (VALIUM) 5 MG tablet Take 1 tablet (5 mg total) by mouth every 8 (eight) hours as needed (muscle spasm).  10 tablet  0  . Diphenhydramine-Acetaminophen (PERCOGESIC PO) Take 2 tablets by mouth 2 (two) times daily as needed. For dental pain      . naproxen (NAPROSYN) 500 MG tablet Take 1 tablet (500 mg total) by mouth 2 (two) times daily as needed.  20 tablet  0  . oxyCODONE-acetaminophen (PERCOCET/ROXICET) 5-325 MG per tablet Take 1-2 tablets by mouth every 4 (four) hours as needed for pain.  12 tablet  0     REVIEW OF SYSTEMS: Other than what stated in the history of present illness, no changes from prior visit  PHYSICAL EXAMINATION:   Vital signs are BP 156/86  Pulse 91  Resp 16  Ht 5\' 9"  (1.753 m)  Wt 200 lb (90.719 kg)  BMI 29.53 kg/m2  SpO2 95% General: The patient appears their stated age. HEENT:  No gross abnormalities Pulmonary:  Non labored breathing Musculoskeletal: Unable to straighten right leg. Point tenderness to the right hip. Neurologic: No focal weakness or paresthesias are detected, Skin: There are no ulcer or rashes noted. Psychiatric: The patient has normal affect. Cardiovascular: There is a regular rate and rhythm  without significant murmur appreciated. No carotid bruits. Pedal pulses are not palpable.   Diagnostic Studies Ultrasound today shows stable ankle-brachial indices. the right is 0.61. The left is 0.63. Both show monophasic waveforms  Assessment: Right hip pain  Plan:  based on the description of  his pain as well as his physical exam, I do not feel that his hip pain is related to his arterial insufficiency. I think this needs to be evaluated by an orthopedic surgeon. I am sending him to Dr. Lajoyce Corners for further evaluation. I did give him 30 Percocet today. He will follow up with me in 6 months   V. Charlena Cross, M.D. [facility] [place] Office: [phone] Pager:  [phone]

## 2012-03-25 ENCOUNTER — Encounter: Payer: Self-pay | Admitting: Surgery

## 2012-03-25 ENCOUNTER — Encounter: Payer: Self-pay | Admitting: Cardiology

## 2012-03-25 ENCOUNTER — Encounter: Payer: Self-pay | Admitting: Internal Medicine

## 2012-03-25 ENCOUNTER — Ambulatory Visit (INDEPENDENT_AMBULATORY_CARE_PROVIDER_SITE_OTHER): Payer: Medicare HMO | Admitting: Internal Medicine

## 2012-03-25 VITALS — BP 130/82 | HR 76 | Temp 98.0°F | Ht 69.0 in | Wt 197.0 lb

## 2012-03-25 DIAGNOSIS — M25559 Pain in unspecified hip: Secondary | ICD-10-CM

## 2012-03-25 DIAGNOSIS — I7389 Other specified peripheral vascular diseases: Secondary | ICD-10-CM

## 2012-03-25 DIAGNOSIS — M25551 Pain in right hip: Secondary | ICD-10-CM

## 2012-03-25 DIAGNOSIS — M7061 Trochanteric bursitis, right hip: Secondary | ICD-10-CM

## 2012-03-25 DIAGNOSIS — M76899 Other specified enthesopathies of unspecified lower limb, excluding foot: Secondary | ICD-10-CM

## 2012-03-25 DIAGNOSIS — E119 Type 2 diabetes mellitus without complications: Secondary | ICD-10-CM

## 2012-03-25 NOTE — Patient Instructions (Signed)
It was good to see you today. We have reviewed your prior records including labs and tests today We have injected your trochanteric bursa with cortisone shot today - place ice to this area 4x/day for 5-10 minutes over next 72h, then as needed If persisting or worsening pain, follow up with Dr Lajoyce Corners after obtaining surgical clearance from cardiology and vascular specialists as needed Medications reviewed, no changes at this time.  Trochanteric Bursitis You have hip pain due to trochanteric bursitis. Bursitis means that the sack near the outside of the hip is filled with fluid and inflamed. This sack is made up of protective soft tissue. The pain from trochanteric bursitis can be severe and keep you from sleep. It can radiate to the buttocks or down the outside of the thigh to the knee. The pain is almost always worse when rising from the seated or lying position and with walking. Pain can improve after you take a few steps. It happens more often in people with hip joint and lumbar spine problems, such as arthritis or previous surgery. Very rarely the trochanteric bursa can become infected, and antibiotics and/or surgery may be needed. Treatment often includes an injection of local anesthetic mixed with cortisone medicine. This medicine is injected into the area where it is most tender over the hip. Repeat injections may be necessary if the response to treatment is slow. You can apply ice packs over the tender area for 30 minutes every 2 hours for the next few days. Anti-inflammatory and/or narcotic pain medicine may also be helpful. Limit your activity for the next few days if the pain continues. See your caregiver in 5-10 days if you are not greatly improved.   SEEK IMMEDIATE MEDICAL CARE IF:  You develop severe pain, fever, or increased redness.   You have pain that radiates below the knee.  EXERCISES STRETCHING EXERCISES - Trochantic Bursitis  These exercises may help you when beginning to  rehabilitate your injury. Your symptoms may resolve with or without further involvement from your physician, physical therapist or athletic trainer. While completing these exercises, remember:    Restoring tissue flexibility helps normal motion to return to the joints. This allows healthier, less painful movement and activity.   An effective stretch should be held for at least 30 seconds.   A stretch should never be painful. You should only feel a gentle lengthening or release in the stretched tissue.  STRETCH  Iliotibial Band  On the floor or bed, lie on your side so your injured leg is on top. Bend your knee and grab your ankle.   Slowly bring your knee back so that your thigh is in line with your trunk. Keep your heel at your buttocks and gently arch your back so your head, shoulders and hips line up.   Slowly lower your leg so that your knee approaches the floor/bed until you feel a gentle stretch on the outside of your thigh. If you do not feel a stretch and your knee will not fall farther, place the heel of your opposite foot on top of your knee and pull your thigh down farther.   Hold this stretch for __________ seconds.   Repeat __________ times. Complete this exercise __________ times per day.  STRETCH Hamstrings, Supine   Lie on your back. Loop a belt or towel over the ball of your foot as shown.   Straighten your knee and slowly pull on the belt to raise your injured leg. Do not allow the knee  to bend. Keep your opposite leg flat on the floor.   Raise the leg until you feel a gentle stretch behind your knee or thigh. Hold this position for __________ seconds.   Repeat __________ times. Complete this stretch __________ times per day.  STRETCH - Quadriceps, Prone   Lie on your stomach on a firm surface, such as a bed or padded floor.   Bend your knee and grasp your ankle. If you are unable to reach, your ankle or pant leg, use a belt around your foot to lengthen your reach.     Gently pull your heel toward your buttocks. Your knee should not slide out to the side. You should feel a stretch in the front of your thigh and/or knee.   Hold this position for __________ seconds.   Repeat __________ times. Complete this stretch __________ times per day.  STRETCHING - Hip Flexors, Lunge Half kneel with your knee on the floor and your opposite knee bent and directly over your ankle.  Keep good posture with your head over your shoulders. Tighten your buttocks to point your tailbone downward; this will prevent your back from arching too much.   You should feel a gentle stretch in the front of your thigh and/or hip. If you do not feel any resistance, slightly slide your opposite foot forward and then slowly lunge forward so your knee once again lines up over your ankle. Be sure your tailbone remains pointed downward.   Hold this stretch for __________ seconds.   Repeat __________ times. Complete this stretch __________ times per day.  STRETCH - Adductors, Lunge  While standing, spread your legs   Lean away from your injured leg by bending your opposite knee. You may rest your hands on your thigh for balance.   You should feel a stretch in your inner thigh. Hold for __________ seconds.   Repeat __________ times. Complete this exercise __________ times per day.  Document Released: 05/29/2004 Document Revised: 07/14/2011 Document Reviewed: 08/03/2008 [facility] Patient Information 2013 [place], [place].    Injection Bursa injections are shots. Your caregiver will place a needle into your hip bursa. The needle is used to put medicine into the joint. These shots can be used to help treat different painful conditions such as osteoarthritis, bursitis, local flare-ups of rheumatoid arthritis, and pseudogout. Anti-inflammatory medicines such as corticosteroids and anesthetics are the most common medicines used for joint and soft tissue injections.   PROCEDURE  The skin will be  cleaned with an antiseptic solution.   Your caregiver will inject a small amount of a local anesthetic (a medicine like Novocaine) just under the skin in the area that was cleaned.   After the area becomes numb, a second injection is done. This second injection usually includes an anesthetic and an anti-inflammatory medicine called a steroid or cortisone. The needle is carefully placed into the bursa, and the medicine is injected into the bursa.   After the injection is done, the needle is removed. Your caregiver may place a bandage over the injection site. The whole procedure takes no more than a couple of minutes.   LET YOUR CAREGIVER KNOW ABOUT:    Allergies.   Medications taken including herbs, eye drops, over the counter medications, and creams.   Use of steroids (by mouth or creams).   Possible pregnancy, if applicable.   Previous problems with anesthetics or Novocaine.   History of blood clots (thrombophlebitis).   History of bleeding or blood problems.   Previous  surgery.   Other health problems.  RISKS AND COMPLICATIONS Side effects from cortisone shots are rare. They include:    Slight bruising of the skin.   Shrinkage of the normal fatty tissue under the skin where the shot was given.   Increase in pain after the shot.   Infection.   Weakening of tendons or tendon rupture.   Allergic reaction to the medicine.   Diabetics may have a temporary increase in their blood sugar after a shot.   Cortisone can temporarily weaken the immune system. While receiving these shots, you should not get certain vaccines. Also, avoid contact with anyone who has chickenpox or measles. Especially if you have never had these diseases or have not been previously immunized. Your immune system may not be strong enough to fight off the infection while the cortisone is in your system.  AFTER THE PROCEDURE    You can go home after the procedure.   You may need to put ice over the site  of injection 15 to 20 minutes every 3 or 4 hours until the pain goes away.  HOME CARE INSTRUCTIONS    Only take over-the-counter or prescription medicines for pain, discomfort, or fever as directed by your caregiver.   You should avoid stressing the joint. Unless advised otherwise, avoid activities that put a lot of pressure on a hip joint, such as:   Jogging.   Bicycling.   Recreational climbing.   Hiking.  SEEK MEDICAL CARE IF:    You have repeated or worsening swelling.   There is drainage from the puncture area.   You develop red streaking that extends above or below the site where the needle was inserted.  SEEK IMMEDIATE MEDICAL CARE IF:    You develop a fever.   You have pain that gets worse even though you are taking pain medicine.   The area is red and warm, and you have trouble moving the joint.  MAKE SURE YOU:    Understand these instructions.   Will watch your condition.   Will get help right away if you are not doing well or get worse.  Document Released: 07/13/2006 Document Revised: 07/14/2011 Document Reviewed: 04/09/2007 [facility] Patient Information 2013 [place], [place].

## 2012-03-25 NOTE — Assessment & Plan Note (Signed)
RLE ABI 03/2012 reviewed - no significant change Advised to stop smoking, continue med mgmt and follow up with vvs and cards as needed if [facility] felt necessary for pain control

## 2012-03-25 NOTE — Progress Notes (Signed)
Subjective:    Patient ID: [name], male    DOB: 03/04/1956, 56 y.o.   MRN: [id number]  HPI Comments: ER eval for same 10/25L lateral R hip pain. Referred by ER for follow up [facility] eval due to hx RLE PAD: no circulatory issue causing current pain, eval last week by ortho Lajoyce Corners) - pt reports xrays with advanced DJD and ortho recommended THR, but would need clearance from cards/vasc due to PAD hx in same leg - Pain somewhat relieved now s/p percocet - pain worse lying on Right side in bed, not improved with sitting, standing but worse with direct pressure  Hip Pain  The incident occurred more than 1 week ago. There was no injury mechanism. The pain is present in the right hip and right thigh. The pain has been constant since onset. Pertinent negatives include no inability to bear weight, loss of motion, loss of sensation, muscle weakness, numbness or tingling. He reports no foreign bodies present. The symptoms are aggravated by weight bearing, movement and palpation (direct pressure lying in bed). He has tried acetaminophen and rest for the symptoms. The treatment provided no relief.    Past Medical History  Diagnosis Date  . Atherosclerosis of native arteries of the extremities with rest pain   . Coronary atherosclerosis of native coronary artery   . PAD (peripheral artery disease)   . DM type 2 (diabetes mellitus, type 2)   . Other and unspecified hyperlipidemia   . Unspecified essential hypertension   . Tobacco use disorder   . H/O alcohol abuse     quit 2011 - dry since that time  . H/O: obesity     lost 100lbs  . Non Hodgkin's lymphoma 2000    tx with XRT at [facility] regional, in remission  . Myocardial infarction June 9,2009    Review of Systems  Neurological: Negative for tingling and numbness.       Objective:   Physical Exam BP 130/82  Pulse 76  Temp 98 F (36.7 C) (Oral)  Ht 5\' 9"  (1.753 m)  Wt 197 lb (89.359 kg)  BMI 29.09 kg/m2  SpO2 97% Constitutional:  He appears  well-developed and well-nourished. No distress.  Neck: Normal range of motion. Neck supple. No JVD present. No thyromegaly present.  Cardiovascular: Normal rate, regular rhythm and normal heart sounds.  No murmur heard. no BLE edema Pulmonary/Chest: Effort normal and breath sounds normal. No respiratory distress. no wheezes.  Musculoskeletal: tender over palpation of greater troch bursa - hip with good ROM and min tender over groin palpation - good hip flexor strength - Back: full range of motion of thoracic and lumbar spine. Non tender to palpation. Negative straight leg raise. DTR's are symmetrically intact. Sensation intact in all dermatomes of the lower extremities. Full strength to manual muscle testing. patient is able to heel toe walk without difficulty and ambulates with antalgic gait. Skin: Skin is warm and dry.  No erythema or ulceration.  Psychiatric: he has a normal mood and affect. behavior is normal. Judgment and thought content normal.   Lab Results  Component Value Date   WBC 9.1 10/28/2011   HGB 16.9 10/28/2011   HCT 46.6 10/28/2011   PLT 211 10/28/2011   GLUCOSE 284* 10/28/2011   CHOL 209* 02/20/2012   TRIG 845.0* 02/20/2012   HDL 27.10* 02/20/2012   LDLDIRECT 67.8 02/20/2012   LDLCALC  Value: 111        Total Cholesterol/HDL:CHD Risk Coronary Heart Disease Risk Table  Men   Women  1/2 Average Risk   3.4   3.3* 10/13/2007   ALT 30 02/20/2012   AST 21 02/20/2012   NA 132* 10/28/2011   K 4.8 10/28/2011   CL 98 10/28/2011   CREATININE 0.98 10/28/2011   BUN 13 10/28/2011   CO2 22 10/28/2011   TSH 1.10 10/16/2011   PSA 0.61 10/16/2011   INR 1.0 ratio 06/11/2009   HGBA1C 7.9* 10/16/2011   MICROALBUR 1.5 10/16/2011     Procedure Note:  Greater trochanteric bursa injection the patient elects to proceed after verbal consent is obtained. the patient informed of possible risks and complications prior to procedure. Using sterile technique throughout, patient is injected  with 1:3 DepoMedrol (40 mg):xylocaine into trochanteric bursa at site of maximal tenderness. the patient tolerated the procedure well. Ice 24-48h, heat thereafter as needed instructions aftercare provided.      Assessment & Plan:   R hip pain - known DJD but report (ortho note and xrays unavailable for my review) Also component of greater trochanteric bursitis - steroid injection today stretches provided - unable to tx with NSAIDs due to study drug participation If unimproved, pt advised to follow up with ortho for consideration of THR after clearance from cards/vasc

## 2012-03-25 NOTE — Assessment & Plan Note (Signed)
Edema with Actos (SE) and Januvia ineffective +$$ prohib On amaryl + metformin Plans to follow with me (PCP) rather than endo On ASA, statin and ACEI The current medical regimen is effective;  continue present plan and medications. Lab Results  Component Value Date   HGBA1C 7.9* 10/16/2011   patient advised on anticipated increase in cbgs due to steroid effect for next several days - pt will call if cbg>200

## 2012-04-09 ENCOUNTER — Other Ambulatory Visit (INDEPENDENT_AMBULATORY_CARE_PROVIDER_SITE_OTHER): Payer: Medicare HMO

## 2012-04-09 ENCOUNTER — Ambulatory Visit (INDEPENDENT_AMBULATORY_CARE_PROVIDER_SITE_OTHER): Payer: Medicare HMO | Admitting: Internal Medicine

## 2012-04-09 ENCOUNTER — Encounter: Payer: Self-pay | Admitting: Internal Medicine

## 2012-04-09 VITALS — BP 132/82 | HR 71 | Temp 97.8°F | Ht 69.0 in | Wt 197.8 lb

## 2012-04-09 DIAGNOSIS — E119 Type 2 diabetes mellitus without complications: Secondary | ICD-10-CM

## 2012-04-09 DIAGNOSIS — M169 Osteoarthritis of hip, unspecified: Secondary | ICD-10-CM

## 2012-04-09 DIAGNOSIS — M1611 Unilateral primary osteoarthritis, right hip: Secondary | ICD-10-CM | POA: Insufficient documentation

## 2012-04-09 DIAGNOSIS — E785 Hyperlipidemia, unspecified: Secondary | ICD-10-CM

## 2012-04-09 DIAGNOSIS — I10 Essential (primary) hypertension: Secondary | ICD-10-CM

## 2012-04-09 HISTORY — DX: Unilateral primary osteoarthritis, right hip: M16.11

## 2012-04-09 MED ORDER — TRAMADOL HCL 50 MG PO TABS: 50.0000 mg | ORAL_TABLET | Freq: Three times a day (TID) | ORAL | Status: DC | PRN

## 2012-04-09 NOTE — Assessment & Plan Note (Signed)
Onset 03/2012 - pain laterally improved with bursa injection but persisting groin pain with ambulation S/p ortho eval ([facility]) -recommended THR, but pt concerned due to RLE vascular issues Add tramadol to Tylenol - unable to use NSAIDs due to antiplt trial study med for PAD Also ?intraarticular hip injection trial prior to [facility] given high surg risk - encouraged to discuss with ortho

## 2012-04-09 NOTE — Progress Notes (Signed)
Subjective:    Patient ID: [name], male    DOB: 1955/09/15, 56 y.o.   MRN: [id number]  HPI  Here for follow up - reviewed chronic medical issues -  DM2 - on Amaryl and metformin - check cbgs at home, no hypoglycemic events - prior actos caused SE and felt Januvia "did nothing" and was $ prohibitive  PAD - BLE claudication pain - works with cards on same - s/p vasc eval 12/2011 ([facility]) - recommended against fem pop at this time or other revascularization  hypertension - If you develop worsening symptoms or fever, call and we can reconsider antibiotics, but it does not appear necessary to use antibiotics at this time.  dyslipidemia - on atorva, holding x 1 week due to MSkel pain - the patient reports compliance with medication(s) as prescribed. Denies adverse side effects.  R hip pain - improved lateral pain since trochanteric bursa injection 11/21 - still groin pain - ?need THR per ortho Lajoyce Corners - feels pain improved since holding Lipitor x 1 week    Past Medical History  Diagnosis Date  . Atherosclerosis of native arteries of the extremities with rest pain   . Coronary atherosclerosis of native coronary artery   . PAD (peripheral artery disease)   . DM type 2 (diabetes mellitus, type 2)   . Other and unspecified hyperlipidemia   . Unspecified essential hypertension   . Tobacco use disorder   . H/O alcohol abuse     quit 2011 - dry since that time  . H/O: obesity     lost 100lbs  . Non Hodgkin's lymphoma 2000    tx with XRT at [facility] regional, in remission  . Myocardial infarction June 9,2009    Review of Systems  Constitutional: Negative for fever or weight change.  Respiratory: Negative for cough and shortness of breath.   Cardiovascular: Negative for chest pain or palpitations.      Objective:   Physical Exam  BP 132/82  Pulse 71  Temp 97.8 F (36.6 C) (Oral)  Ht 5\' 9"  (1.753 m)  Wt 197 lb 12.8 oz (89.721 kg)  BMI 29.21 kg/m2  SpO2 96% Weight: 197 lb 12.8 oz  (89.721 kg)  Constitutional:  He appears well-developed and well-nourished. No distress. Neck: Normal range of motion. Neck supple. No JVD present. No thyromegaly present.  Cardiovascular: Normal rate, regular rhythm and normal heart sounds.  No murmur heard. no BLE edema Pulmonary/Chest: Effort normal and breath sounds normal. No respiratory distress. no wheezes.  Psychiatric: he has a normal mood and affect. behavior is normal. Judgment and thought content normal.   Lab Results  Component Value Date   WBC 9.1 10/28/2011   HGB 16.9 10/28/2011   HCT 46.6 10/28/2011   PLT 211 10/28/2011   GLUCOSE 284* 10/28/2011   CHOL 209* 02/20/2012   TRIG 845.0* 02/20/2012   HDL 27.10* 02/20/2012   LDLDIRECT 67.8 02/20/2012   LDLCALC  Value: 111        Total Cholesterol/HDL:CHD Risk Coronary Heart Disease Risk Table                     Men   Women  1/2 Average Risk   3.4   3.3* 10/13/2007   ALT 30 02/20/2012   AST 21 02/20/2012   NA 132* 10/28/2011   K 4.8 10/28/2011   CL 98 10/28/2011   CREATININE 0.98 10/28/2011   BUN 13 10/28/2011   CO2 22 10/28/2011   TSH  1.10 10/16/2011   PSA 0.61 10/16/2011   INR 1.0 ratio 06/11/2009   HGBA1C 7.9* 10/16/2011   MICROALBUR 1.5 10/16/2011        Assessment & Plan:    Also See problem list. Medications and labs reviewed today.

## 2012-04-09 NOTE — Assessment & Plan Note (Signed)
BP Readings from Last 3 Encounters:  04/09/12 132/82  03/25/12 130/82  03/15/12 156/86   The current medical regimen is generally effective;  continue present plan and medications.

## 2012-04-09 NOTE — Assessment & Plan Note (Signed)
High TGs - no longer on Lovaza due to cost Continue atorva as tolerated by MSkel pain - last lipids reviewed Follow with cards as ongoin

## 2012-04-09 NOTE — Patient Instructions (Signed)
It was good to see you today. Test(s) ordered today. Your results will be released to MyChart (or called to you) after review, usually within 72hours after test completion. If any changes need to be made, you will be notified at that same time. Use Tramadol as needed for hip pain in addition to Tylenol -Your prescription(s) have been submitted to your pharmacy. Please take as directed and contact our office if you believe you are having problem(s) with the medication(s). If continued hip pain at groin, talk with Dr Lajoyce Corners about possible hip injection prior to scheduling surgery Please schedule followup in 4-6 months for diabetes mellitus and blood pressure check, call sooner if problems.

## 2012-04-09 NOTE — Assessment & Plan Note (Signed)
Edema with Actos (SE) and Januvia ineffective +$$ prohib On amaryl + metformin Plans to follow with me (PCP) rather than endo On ASA, statin and ACEI The current medical regimen is effective;  continue present plan and medications. Lab Results  Component Value Date   HGBA1C 7.9* 10/16/2011

## 2012-04-20 ENCOUNTER — Other Ambulatory Visit: Payer: Self-pay

## 2012-05-26 ENCOUNTER — Other Ambulatory Visit: Payer: Self-pay | Admitting: *Deleted

## 2012-05-26 MED ORDER — LISINOPRIL 40 MG PO TABS: 40.0000 mg | ORAL_TABLET | Freq: Every day | ORAL | Status: DC

## 2012-06-09 ENCOUNTER — Telehealth: Payer: Self-pay | Admitting: Cardiology

## 2012-06-09 DIAGNOSIS — E785 Hyperlipidemia, unspecified: Secondary | ICD-10-CM

## 2012-06-09 DIAGNOSIS — I2510 Atherosclerotic heart disease of native coronary artery without angina pectoris: Secondary | ICD-10-CM

## 2012-06-09 MED ORDER — METOPROLOL TARTRATE 50 MG PO TABS: 50.0000 mg | ORAL_TABLET | Freq: Two times a day (BID) | ORAL | Status: DC

## 2012-06-09 MED ORDER — LISINOPRIL 40 MG PO TABS: 40.0000 mg | ORAL_TABLET | Freq: Every day | ORAL | Status: DC

## 2012-06-09 MED ORDER — PRAVASTATIN SODIUM 80 MG PO TABS: 80.0000 mg | ORAL_TABLET | Freq: Every evening | ORAL | Status: DC

## 2012-06-09 NOTE — Telephone Encounter (Signed)
I will forward to Dr Shirlee Latch for further recommendations.

## 2012-06-09 NOTE — Telephone Encounter (Signed)
Would ideally have him try Crestor 5 mg daily if he can get coverage for it.  Otherwise, pravastatin 80 mg daily.  Would not get lipids until he has been on new statin x 2 months.

## 2012-06-09 NOTE — Telephone Encounter (Signed)
Pt states he has not been taking lipitor for 3 weeks due to hip/arthritis pain. His symptoms have improved off lipitor.  He states he has  taken  pravastatin in the past without problems.

## 2012-06-09 NOTE — Telephone Encounter (Signed)
New Problem:   Anne: Patient called in wanting to schedule blood work for 06/15/12 at 1:00pm at the [facility] center.  Please call back.   Refill: Patient called in needing a refill of his metoprolol (LOPRESSOR) 50 MG tablet and lisinopril (PRINIVIL,ZESTRIL) 40 MG tablet.

## 2012-06-09 NOTE — Telephone Encounter (Signed)
Needs lipids and a BMET

## 2012-06-09 NOTE — Telephone Encounter (Signed)
Spoke with pt. Pt prefers to try pravastatin 80 mg first, he feels crestor will not be feasible financially. He will return for fasting lab 08/17/12 prior to appt with Dr Shirlee Latch 08/18/12. Pt prefers [facility].

## 2012-06-14 ENCOUNTER — Other Ambulatory Visit: Payer: Self-pay | Admitting: *Deleted

## 2012-06-14 MED ORDER — LISINOPRIL 40 MG PO TABS: 40.0000 mg | ORAL_TABLET | Freq: Every day | ORAL | Status: DC

## 2012-06-15 ENCOUNTER — Ambulatory Visit: Payer: Self-pay | Admitting: Cardiology

## 2012-06-15 ENCOUNTER — Encounter (INDEPENDENT_AMBULATORY_CARE_PROVIDER_SITE_OTHER): Payer: Medicare HMO | Admitting: Cardiology

## 2012-06-15 DIAGNOSIS — I739 Peripheral vascular disease, unspecified: Secondary | ICD-10-CM

## 2012-08-16 ENCOUNTER — Encounter: Payer: Self-pay | Admitting: *Deleted

## 2012-08-17 ENCOUNTER — Other Ambulatory Visit (INDEPENDENT_AMBULATORY_CARE_PROVIDER_SITE_OTHER): Payer: Medicare HMO

## 2012-08-17 DIAGNOSIS — I2510 Atherosclerotic heart disease of native coronary artery without angina pectoris: Secondary | ICD-10-CM

## 2012-08-17 DIAGNOSIS — E785 Hyperlipidemia, unspecified: Secondary | ICD-10-CM

## 2012-08-17 LAB — BASIC METABOLIC PANEL
BUN: 13 mg/dL (ref 6–23)
CO2: 23 mEq/L (ref 19–32)
Chloride: 100 mEq/L (ref 96–112)
Glucose, Bld: 189 mg/dL — ABNORMAL HIGH (ref 70–99)
Potassium: 4.2 mEq/L (ref 3.5–5.1)

## 2012-08-17 LAB — HEPATIC FUNCTION PANEL
Alkaline Phosphatase: 42 U/L (ref 39–117)
Bilirubin, Direct: 0.1 mg/dL (ref 0.0–0.3)
Total Bilirubin: 0.4 mg/dL (ref 0.3–1.2)
Total Protein: 7.1 g/dL (ref 6.0–8.3)

## 2012-08-17 LAB — LIPID PANEL
HDL: 30.9 mg/dL — ABNORMAL LOW (ref >39.00)
Total CHOL/HDL Ratio: 6
VLDL: 110.6 mg/dL — ABNORMAL HIGH (ref 0.0–40.0)

## 2012-08-17 LAB — LDL CHOLESTEROL, DIRECT: Direct LDL: 78.8 mg/dL

## 2012-08-18 ENCOUNTER — Encounter: Payer: Self-pay | Admitting: Cardiology

## 2012-08-18 ENCOUNTER — Telehealth: Payer: Self-pay | Admitting: Cardiology

## 2012-08-18 ENCOUNTER — Ambulatory Visit (INDEPENDENT_AMBULATORY_CARE_PROVIDER_SITE_OTHER): Payer: Medicare HMO | Admitting: Cardiology

## 2012-08-18 VITALS — BP 138/82 | HR 78 | Ht 69.0 in | Wt 198.0 lb

## 2012-08-18 DIAGNOSIS — R079 Chest pain, unspecified: Secondary | ICD-10-CM

## 2012-08-18 DIAGNOSIS — I2510 Atherosclerotic heart disease of native coronary artery without angina pectoris: Secondary | ICD-10-CM

## 2012-08-18 DIAGNOSIS — E785 Hyperlipidemia, unspecified: Secondary | ICD-10-CM

## 2012-08-18 DIAGNOSIS — I10 Essential (primary) hypertension: Secondary | ICD-10-CM

## 2012-08-18 DIAGNOSIS — I7389 Other specified peripheral vascular diseases: Secondary | ICD-10-CM

## 2012-08-18 MED ORDER — FENOFIBRATE 145 MG PO TABS: 145.0000 mg | ORAL_TABLET | Freq: Every day | ORAL | Status: DC

## 2012-08-18 MED ORDER — FENOFIBRATE 160 MG PO TABS: 160.0000 mg | ORAL_TABLET | Freq: Every day | ORAL | Status: DC

## 2012-08-18 MED ORDER — ISOSORBIDE MONONITRATE ER 30 MG PO TB24: 30.0000 mg | ORAL_TABLET | Freq: Every day | ORAL | Status: DC

## 2012-08-18 NOTE — Patient Instructions (Addendum)
Your physician recommends that you schedule a follow-up appointment in: 2-3 weeks with Dr Shirlee Latch   Your physician has requested that you have a lexiscan myoview. For further information please visit [email or web address] Please follow instruction sheet, as given.  Your physician has recommended you make the following change in your medication:  1) Start Imdur 30mg  daily 2) Start Fenofibrate 145mg  daily  Your physician recommends that you return for lab work in: 2 months FASTING lipid/LFTS

## 2012-08-18 NOTE — Telephone Encounter (Signed)
Will forward note to Dr.McLean to clarify if pt should be on Lovaza or fenofibrate

## 2012-08-18 NOTE — Telephone Encounter (Signed)
New Problem:    Patient called in because he cannot afford his fenofibrate (TRICOR) 145 MG tablet that he was prescribed today, and wanted to know if there was a cheaper alternative.  Please call back.

## 2012-08-18 NOTE — Telephone Encounter (Signed)
Can you check to make sure that he cannot get generic fenofibrate? I would think that it should be cheaper.

## 2012-08-18 NOTE — Telephone Encounter (Signed)
rx sent to pharmacy by e-script For GENERIC Fenofibrite 160mg  take one tablet daily with #90 and 3 refills to [facility]  Pt made aware via telephone

## 2012-08-18 NOTE — Telephone Encounter (Signed)
Follow-up:     Patient called in stating the 160mg  is fine with [facility].  Please call back.

## 2012-08-18 NOTE — Telephone Encounter (Signed)
Spoke to pt to advise results/instructions. Pt understood. Pt advised that he will contact [facility] to discuss pricing of the fenofibrite 160mg  per was advised by in house pharmacist Weston Brass that the 160mg  dosage is the [facility] dosage and equal to the 145mg  tricor dosage, pt will contact office with his decision once information from pharmacy is received

## 2012-08-18 NOTE — Progress Notes (Signed)
Patient ID: [name], male   DOB: 12-30-1955, 57 y.o.   MRN: [id number] PCP: Felicity Coyer  56 yo with history of CAD, severe PAD, and diabetes presents for followup. He had NSTEMI in 6/09 that was treated medically as patient refused catheterization due to financial issues. He also has severe claudication. Patient had catheterization in 2/11 showing totally occluded circumflex artery with some collaterals as well as severe PAD (see PMH for description).  He has seen Dr. Myra Gianotti and plan for now is observation with the option of fem-pop bypass down the road.   Patient still has right leg/foot pain after walking 10-20 feet.  He does not have any ulcers on his feet.  This is stable.  ABIs in 2/14 were stable.  He continues to abstain from smoking.  He also has left hip pain from osteoarthritis and has had a steroid shot in his hip.  For the last 2 wks, he has been having mild heaviness in his central chest with radiation to the jaw. This is somewhat reminiscent to prior MI but not as severe.  It occurs especially in the morning before he takes his BP meds.  It is not related to activity.  In fact, there is no particular trigger.  He is short of breath with heavy exertion such as yardwork but will not get chest pain.  Finally, his triglycerides are still high.  He is not taking fenofibrate because he thinks it caused his hip pain.  He is taking Lovaza but it is expensive.   Labs (3/11): HDL 20 => 36, LDL 72, TGs 1914 => 448  Labs (2/11): K 4.4, creatinine 0.8  Labs (6/13): TGs 539, HDL 33, LDL 84, K 4.7, creatinine 1.2 Labs (4/14): K 4.2, creatinine 1.0, TGs 553, LDL 79, HDL 31  ECG: NSR, PVC, old inferior MI  Allergies (verified):  No Known Drug Allergies   Past Medical History:  1. Non-Hodgkin lymphoma. This was treated about 10 years ago with radiation, it is now in remission. He had it in his axillary area and inguinal areas.  2. Formerly morbidly obese, but lost 100 pounds by diet and exercise.   3. Hypertension.  4. Hypercholesterolemia.  Unable to tolerate fenofibrate. Myalgias with atorvastatin.  5. History of alcohol abuse.  6. Current smoker. He smokes a pack a day. ? COPD.  7. Coronary artery disease. The patient did have a non-ST-elevation MI in June 2009 that was treated medically. The patient refused LHC at that time. LHC done 2/11 showed totally occluded CFX with faint R->L collaterals, 40-50% proximal LAD, patent RCA. Renal arteries were patent.  8. Echocardiogram done in June 2009 showed an EF of 60%, mild LVH, no regional wall motion abnormalities.  9. PAD. He underwent ABIs and lower extremity duplex scanning on February 18, 2008. This demonstrated occlusion of the distal right superficial femoral artery with reconstitution at the level of the popliteal artery. There appeared to be some degree of inflow disease because of biphasic common femoral waveforms. The ABI was 0.49 on the right and 0.87 on the left. Peripheral angiogram (2/11) showed 50% L CIA stenosis, 90% stenosis of L SFA and popliteal, totally occluded R SFA with reconstitution of popliteal. Fem-pop bypass would be option => he has seen Dr. Myra Gianotti, would operate for pain at rest or tissue ischemia.  ABIs (2/14) with 0.66 on right, 0.69 on left.  10. Type II diabetes: Peripheral edema with Actos.   Family History:  The patient's mother had a  myocardial infarction in her 53s. He had 2 aunts with myocardial infarctions in their 24s. His father had peripheral arterial disease. No diabetes in his immediate family.   Social History:  The patient lives with his wife. He is an unemployed Investment banker, operational, originally from [place]. He has no children. He quit smoking in 10/13. He used to drink heavily but has quit. He is on disability.   Review of Systems  All systems reviewed and negative except as per HPI.   Current Outpatient Prescriptions  Medication Sig Dispense Refill  . AMBULATORY NON FORMULARY MEDICATION Medication  Name: EUCLID STUDY DRUG-Plavix vs Brilinta      . cilostazol (PLETAL) 100 MG tablet Take 100 mg by mouth 2 (two) times daily.       . Diphenhydramine-Acetaminophen (PERCOGESIC PO) Take 2 tablets by mouth 2 (two) times daily as needed. For dental pain      . glimepiride (AMARYL) 4 MG tablet Take 1 tablet (4 mg total) by mouth daily before breakfast.  90 tablet  2  . glucose blood (TRUETEST TEST) test strip Use as instructed to check blood sugar twice daily dx 250.00  200 each  3  . lisinopril (PRINIVIL,ZESTRIL) 40 MG tablet Take 1 tablet (40 mg total) by mouth daily.  90 tablet  1  . metFORMIN (GLUCOPHAGE-XR) 500 MG 24 hr tablet Take 1,000 mg by mouth 2 (two) times daily.      . metoprolol (LOPRESSOR) 50 MG tablet Take 1 tablet (50 mg total) by mouth 2 (two) times daily.  180 tablet  1  . omega-3 acid ethyl esters (LOVAZA) 1 G capsule Take 2 capsules (2 g total) by mouth 2 (two) times daily.  360 capsule  3  . pravastatin (PRAVACHOL) 80 MG tablet Take 1 tablet (80 mg total) by mouth every evening.  90 tablet  0  . TRUEPLUS LANCETS 28G MISC 1 each by Does not apply route 2 (two) times daily. Dx 250.00  200 each  3   No current facility-administered medications for this visit.    BP 138/82  Pulse 78  Ht 5\' 9"  (1.753 m)  Wt 198 lb (89.812 kg)  BMI 29.23 kg/m2 General: NAD Neck: No JVD, no thyromegaly or thyroid nodule.  Lungs: Distant breath sounds.  CV: Nondisplaced PMI.  Heart regular S1/S2, no S3/S4, 1/6 SEM.  No peripheral edema.  No carotid bruit.  Unable to palpate DP or PT pulses on either leg. Abdomen: Soft, nontender, no hepatosplenomegaly, no distention.   Neurologic: Alert and oriented x 3.  Psych: Normal affect. Extremities: No clubbing or cyanosis.   Assessment/Plan:  CAD, NATIVE VESSEL  Known CAD.  Over the last 2 wks, he has been having chest pain episodes.  Episodes are not exertional but the character of the pain reminds him of his prior MI (but more mild).   He is on a  study drug, either Plavix or Brilinta. He will continue ACEI, beta blocker, and statin.  - Add Imdur 30 mg daily given chest pain.  - Lexiscan Sestamibi to assess for ischemia.  PVD WITH CLAUDICATION  Known severe PAD (see PMH). Symptoms stable, not at rest and no pedal ulcers. He has quit smoking. He is very limited by claudication.  He is on cilostazol and either Plavix or Brilinta (study drug).  He has seen Dr. Myra Gianotti, fem-pop bypass on right would be option if symptoms become intolerable. ABIs stable in 2/14.  HYPERLIPIDEMIA-MIXED Lovaza getting too expensive.  I do not think  that fenofibrate caused his hip joint pain.  I suggested that he replace Lovaza with generic fenofibrate 145 mg daily. Lipids/LFTs in 2 months.   Hypertension BP is under good control.   [name] 08/18/2012 10:15 AM

## 2012-08-18 NOTE — Telephone Encounter (Signed)
Per Dr.McLean pt is currently on Lovaza. If unable to afford both he should stop Lovaza and continue fenofibrate. I spoke with pt and gave him this information. He states fenofibrate is now a Tier 4 medication and he cannot afford. He reports fenofibrate is 3 times more expensive than Lovaza.

## 2012-08-19 ENCOUNTER — Other Ambulatory Visit: Payer: Self-pay | Admitting: *Deleted

## 2012-08-19 ENCOUNTER — Ambulatory Visit (HOSPITAL_COMMUNITY): Payer: Medicare HMO | Attending: Cardiology | Admitting: Radiology

## 2012-08-19 VITALS — BP 133/80 | HR 88 | Ht 70.0 in | Wt 196.0 lb

## 2012-08-19 DIAGNOSIS — R0989 Other specified symptoms and signs involving the circulatory and respiratory systems: Secondary | ICD-10-CM | POA: Insufficient documentation

## 2012-08-19 DIAGNOSIS — R6884 Jaw pain: Secondary | ICD-10-CM | POA: Insufficient documentation

## 2012-08-19 DIAGNOSIS — I10 Essential (primary) hypertension: Secondary | ICD-10-CM | POA: Insufficient documentation

## 2012-08-19 DIAGNOSIS — R0602 Shortness of breath: Secondary | ICD-10-CM

## 2012-08-19 DIAGNOSIS — Z8249 Family history of ischemic heart disease and other diseases of the circulatory system: Secondary | ICD-10-CM | POA: Insufficient documentation

## 2012-08-19 DIAGNOSIS — E785 Hyperlipidemia, unspecified: Secondary | ICD-10-CM

## 2012-08-19 DIAGNOSIS — I739 Peripheral vascular disease, unspecified: Secondary | ICD-10-CM | POA: Insufficient documentation

## 2012-08-19 DIAGNOSIS — I252 Old myocardial infarction: Secondary | ICD-10-CM | POA: Insufficient documentation

## 2012-08-19 DIAGNOSIS — R079 Chest pain, unspecified: Secondary | ICD-10-CM

## 2012-08-19 DIAGNOSIS — I2510 Atherosclerotic heart disease of native coronary artery without angina pectoris: Secondary | ICD-10-CM

## 2012-08-19 DIAGNOSIS — Z87891 Personal history of nicotine dependence: Secondary | ICD-10-CM | POA: Insufficient documentation

## 2012-08-19 DIAGNOSIS — R0789 Other chest pain: Secondary | ICD-10-CM | POA: Insufficient documentation

## 2012-08-19 DIAGNOSIS — R0609 Other forms of dyspnea: Secondary | ICD-10-CM | POA: Insufficient documentation

## 2012-08-19 DIAGNOSIS — R5381 Other malaise: Secondary | ICD-10-CM | POA: Insufficient documentation

## 2012-08-19 DIAGNOSIS — R5383 Other fatigue: Secondary | ICD-10-CM | POA: Insufficient documentation

## 2012-08-19 MED ORDER — GLIMEPIRIDE 4 MG PO TABS: 4.0000 mg | ORAL_TABLET | Freq: Every day | ORAL | Status: DC

## 2012-08-19 MED ORDER — REGADENOSON 0.4 MG/5ML IV SOLN
0.4000 mg | Freq: Once | INTRAVENOUS | Status: AC
  Administered 2012-08-19: 0.4 mg via INTRAVENOUS

## 2012-08-19 MED ORDER — TECHNETIUM TC 99M SESTAMIBI GENERIC - CARDIOLITE
30.0000 | Freq: Once | INTRAVENOUS | Status: AC | PRN
  Administered 2012-08-19: 30 via INTRAVENOUS

## 2012-08-19 MED ORDER — TECHNETIUM TC 99M SESTAMIBI GENERIC - CARDIOLITE
10.0000 | Freq: Once | INTRAVENOUS | Status: AC | PRN
  Administered 2012-08-19: 10 via INTRAVENOUS

## 2012-08-19 MED ORDER — PRAVASTATIN SODIUM 80 MG PO TABS: 80.0000 mg | ORAL_TABLET | Freq: Every evening | ORAL | Status: DC

## 2012-08-19 NOTE — Progress Notes (Addendum)
[facility] SITE 3 NUCLEAR MED [address] [place], [place] [address] [phone]    Cardiology Nuclear Med Study  [name] is a 57 y.o. male     MRN : [id number]     DOB: [birth year]  Procedure Date: 08/19/2012  Nuclear Med Background Indication for Stress Test:  Evaluation for Ischemia History:  '09 NSTEMI-refused cath-medical mgmt only; '09 Echo:EF=60%; '11 Cath:occluded cfx with collaterals with other n/o CAD Cardiac Risk Factors: Claudication, Family History - CAD, History of Smoking, Hypertension, Lipids, NIDDM and PVD  Symptoms:  Chest Tightness with Jaw Pain (last episode of chest discomfort was yesterday, relieved with NTG x 1), DOE and Fatigue   Nuclear Pre-Procedure Caffeine/Decaff Intake:  None NPO After: 9:00am   Lungs:  Clear. O2 Sat: 94% on room air. IV 0.9% NS with Angio Cath:  20g  IV Site: R Wrist  IV Started by:  Stanton Kidney, EMT-P  Chest Size (in):  44 Cup Size: n/a  Height: 5\' 10"  (1.778 m)  Weight:  196 lb (88.905 kg)  BMI:  Body mass index is 28.12 kg/(m^2). Tech Comments:  Lopressor held this am, per patient.    Nuclear Med Study 1 or 2 day study: 1 day  Stress Test Type:  Eugenie Birks  Reading MD: [name], MD  Order Authorizing Provider:  [name], MD  Resting Radionuclide: Technetium 98m Sestamibi  Resting Radionuclide Dose: 11.0 mCi   Stress Radionuclide:  Technetium 66m Sestamibi  Stress Radionuclide Dose: 33.0 mCi           Stress Protocol Rest HR: 88 Stress HR: 115  Rest BP: 133/80 Stress BP: 149/79  Exercise Time (min): n/a METS: n/a   Predicted Max HR: 163 bpm % Max HR: 70.55 bpm Rate Pressure Product: 21308   Dose of Adenosine (mg):  n/a Dose of Lexiscan: 0.4 mg  Dose of Atropine (mg): n/a Dose of Dobutamine: n/a mcg/kg/min (at max HR)  Stress Test Technologist: Smiley Houseman, CMA-N  Nuclear Technologist:  Doyne Keel, CNMT     Rest Procedure:  Myocardial perfusion imaging was performed at rest 45 minutes  following the intravenous administration of Technetium 82m Sestamibi.  Rest ECG: NSR - Normal EKG  Stress Procedure:  The patient received IV Lexiscan 0.4 mg over 15-seconds.  Technetium 59m Sestamibi injected at 30-seconds.  He did c/o chest pressure with Lexiscan.  Quantitative spect images were obtained after a 45 minute delay.  Stress ECG: No significant change from baseline ECG  QPS Raw Data Images:  Normal; no motion artifact; normal heart/lung ratio. Stress Images:  Medium-sized, moderate basal to mid inferolateral perfusion defect.  Rest Images:  Medium-sized, moderate basal to mid inferolateral perfusion defect Subtraction (SDS):  Fixed, medium-sized, moderate basal to mid inferolateral perfusion defect.  Transient Ischemic Dilatation (Normal <1.22):  1.07 Lung/Heart Ratio (Normal <0.45):  0.36  Quantitative Gated Spect Images QGS EDV:  82 ml QGS ESV:  32 ml  Impression Exercise Capacity:  Lexiscan with no exercise. BP Response:  Normal blood pressure response. Clinical Symptoms:  Chest heaviness ECG Impression:  No significant ST segment change suggestive of ischemia. Comparison with Prior Nuclear Study: No previous nuclear study performed  Overall Impression:  Low risk stress nuclear study.  Primarily fixed medium-sized moderate basal to mid inferolateral perfusion defect.  This suggests prior MI with some peri-infarct ischemia. This is compatible with known history of chronic LCx occlusion.   LV Ejection Fraction: 61%.  LV Wall Motion:  NL LV  Function; NL Wall Motion  [name] 08/19/2012  Evidence for prior MI, territory of his known occluded LCX is abnormal on the stress test.  No evidence for significant ischemia in another distribution.  Would continue medical management with addition of Imdur 30 (at last appointment).  If this helped but he is still having chest pain, can increase to 60 mg daily.  Have him followup with me as scheduled (should have been 2 wks  after last appointment).   [name] 08/20/2012

## 2012-08-23 ENCOUNTER — Encounter: Payer: Self-pay | Admitting: *Deleted

## 2012-08-23 MED ORDER — ISOSORBIDE MONONITRATE ER 30 MG PO TB24: 30.0000 mg | ORAL_TABLET | Freq: Two times a day (BID) | ORAL | Status: DC

## 2012-08-23 NOTE — Patient Instructions (Signed)
Called patient with stress test results. He states that Isosorbide has helped the chest pain but he still has pain with exertion. Will increase Isosorbide to 30mg  bid and follow up on 5/6 per Dr.McLean's orders.

## 2012-08-23 NOTE — Addendum Note (Signed)
Addended by: Gerome Apley on: 08/23/2012 09:05 AM   Modules accepted: Orders

## 2012-09-07 ENCOUNTER — Ambulatory Visit (INDEPENDENT_AMBULATORY_CARE_PROVIDER_SITE_OTHER): Payer: Medicare HMO | Admitting: Cardiology

## 2012-09-07 ENCOUNTER — Encounter: Payer: Self-pay | Admitting: Cardiology

## 2012-09-07 VITALS — BP 126/60 | HR 85 | Ht 70.0 in | Wt 193.0 lb

## 2012-09-07 DIAGNOSIS — I7389 Other specified peripheral vascular diseases: Secondary | ICD-10-CM

## 2012-09-07 DIAGNOSIS — I2510 Atherosclerotic heart disease of native coronary artery without angina pectoris: Secondary | ICD-10-CM

## 2012-09-07 DIAGNOSIS — E785 Hyperlipidemia, unspecified: Secondary | ICD-10-CM

## 2012-09-07 DIAGNOSIS — R079 Chest pain, unspecified: Secondary | ICD-10-CM

## 2012-09-07 MED ORDER — RANOLAZINE ER 500 MG PO TB12: 500.0000 mg | ORAL_TABLET | Freq: Two times a day (BID) | ORAL | Status: DC

## 2012-09-07 NOTE — Progress Notes (Signed)
Patient ID: [name], male   DOB: 20-Feb-1956, 57 y.o.   MRN: [id number] PCP: Felicity Coyer  57 yo with history of CAD, severe PAD, and diabetes presents for followup. He had NSTEMI in 6/09 that was treated medically as patient refused catheterization due to financial issues. He also has severe claudication. Patient had catheterization in 2/11 showing totally occluded circumflex artery with some collaterals as well as severe PAD (see PMH for description).  He has seen Dr. Myra Gianotti and plan for now is observation with the option of fem-pop bypass down the road.   Patient still has right leg/foot pain after walking 10-20 feet.  He does not have any ulcers on his feet.  This is stable.  ABIs in 2/14 were stable.  He continues to abstain from smoking.  He also has left hip pain from osteoarthritis and has had a steroid shot in his hip.  At last appointment, he reported increased chest pain.  It would occur especially in the morning before he took his BP meds. It was not necessarily related to activity.  I had him do a Lexiscan Sestamibi.  This showed a primarily fixed lateral defect consistent with prior lateral MI but no significant ischemia.  I also started him on Imdur 30 mg daily.  This has been increased to 30 mg bid.  The Imdur helps the chest pain but he still gets pain in the morning before he takes his meds and about 12 hrs after his first Imdur dose.  After taking Imdur, he does not have chest pain.  He has stopped Lovaza (did not make much effect on his triglycerides) and is now taking fenofibrate.   Labs (3/11): HDL 20 => 36, LDL 72, TGs 4098 => 448  Labs (2/11): K 4.4, creatinine 0.8  Labs (6/13): TGs 539, HDL 33, LDL 84, K 4.7, creatinine 1.2 Labs (4/14): K 4.2, creatinine 1.0, TGs 553, LDL 79, HDL 31  Allergies (verified):  No Known Drug Allergies   Past Medical History:  1. Non-Hodgkin lymphoma. This was treated about 10 years ago with radiation, it is now in remission. He had it in his  axillary area and inguinal areas.  2. Formerly morbidly obese, but lost 100 pounds by diet and exercise.  3. Hypertension.  4. Hypercholesterolemia.  Unable to tolerate fenofibrate. Myalgias with atorvastatin.  5. History of alcohol abuse.  6. Current smoker. He smokes a pack a day. ? COPD.  7. Coronary artery disease. The patient did have a non-ST-elevation MI in June 2009 that was treated medically. The patient refused LHC at that time. LHC done 2/11 showed totally occluded CFX with faint R->L collaterals, 40-50% proximal LAD, patent RCA. Renal arteries were patent.  Lexiscan Sestamibi (4/14) with EF 61%, primarily fixed medium-sized basal to mid inferolateral perfusion defect suggestive of prior MI with minimal ischemia.  8. Echocardiogram done in June 2009 showed an EF of 60%, mild LVH, no regional wall motion abnormalities.  9. PAD. He underwent ABIs and lower extremity duplex scanning on February 18, 2008. This demonstrated occlusion of the distal right superficial femoral artery with reconstitution at the level of the popliteal artery. There appeared to be some degree of inflow disease because of biphasic common femoral waveforms. The ABI was 0.49 on the right and 0.87 on the left. Peripheral angiogram (2/11) showed 50% L CIA stenosis, 90% stenosis of L SFA and popliteal, totally occluded R SFA with reconstitution of popliteal. Fem-pop bypass would be option => he has seen Dr.  Brabham, would operate for pain at rest or tissue ischemia.  ABIs (2/14) with 0.66 on right, 0.69 on left.  10. Type II diabetes: Peripheral edema with Actos.   Family History:  The patient's mother had a myocardial infarction in her 64s. He had 2 aunts with myocardial infarctions in their 41s. His father had peripheral arterial disease. No diabetes in his immediate family.   Social History:  The patient lives with his wife. 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 He quit smoking in  10/13. He used to drink heavily but has quit. He is on disability.   Review of Systems  All systems reviewed and negative except as per HPI.   Current Outpatient Prescriptions  Medication Sig Dispense Refill  . AMBULATORY NON FORMULARY MEDICATION Medication Name: EUCLID STUDY DRUG-Plavix vs Brilinta      . cilostazol (PLETAL) 100 MG tablet Take 100 mg by mouth 2 (two) times daily.       . Diphenhydramine-Acetaminophen (PERCOGESIC PO) Take 2 tablets by mouth 2 (two) times daily as needed. For dental pain      . fenofibrate 160 MG tablet Take 1 tablet (160 mg total) by mouth daily.  90 tablet  3  . glimepiride (AMARYL) 4 MG tablet Take 1 tablet (4 mg total) by mouth daily before breakfast.  90 tablet  1  . glucose blood (TRUETEST TEST) test strip Use as instructed to check blood sugar twice daily dx 250.00  200 each  3  . isosorbide mononitrate (IMDUR) 30 MG 24 hr tablet Take 1 tablet (30 mg total) by mouth 2 (two) times daily.      Marland Kitchen lisinopril (PRINIVIL,ZESTRIL) 40 MG tablet Take 1 tablet (40 mg total) by mouth daily.  90 tablet  1  . metFORMIN (GLUCOPHAGE-XR) 500 MG 24 hr tablet Take 1,000 mg by mouth 2 (two) times daily.      . metoprolol (LOPRESSOR) 50 MG tablet Take 1 tablet (50 mg total) by mouth 2 (two) times daily.  180 tablet  1  . pravastatin (PRAVACHOL) 80 MG tablet Take 1 tablet (80 mg total) by mouth every evening.  90 tablet  0  . TRUEPLUS LANCETS 28G MISC 1 each by Does not apply route 2 (two) times daily. Dx 250.00  200 each  3  . ranolazine (RANEXA) 500 MG 12 hr tablet Take 1 tablet (500 mg total) by mouth 2 (two) times daily.  60 tablet  11   No current facility-administered medications for this visit.    BP 126/60  Pulse 85  Ht 5\' 10"  (1.778 m)  Wt 193 lb (87.544 kg)  BMI 27.69 kg/m2  SpO2 98% General: NAD Neck: No JVD, no thyromegaly or thyroid nodule.  Lungs: Distant breath sounds.  CV: Nondisplaced PMI.  Heart regular S1/S2, no S3/S4, 1/6 SEM.  No peripheral  edema.  No carotid bruit.  Unable to palpate DP or PT pulses on either leg. Abdomen: Soft, nontender, no hepatosplenomegaly, no distention.   Neurologic: Alert and oriented x 3.  Psych: Normal affect. Extremities: No clubbing or cyanosis.   Assessment/Plan:  CAD, NATIVE VESSEL  Known CAD.  He is on a study drug, either Plavix or Brilinta. He will continue ACEI, beta blocker, and statin.  Imdur seems to help his chest pain, which is somewhat atypical.  However, Imdur is not giving him complete coverage and he still gets almost daily chest pain episodes.  Lexiscan Sestamibi was suggestive of prior LCx-territory  MI (totally occluded LCx on last cath).  There did not appear to be significant ischemia.   - I am going to continue medical management given minimal ischemia on stress test.  Given some ongoing chest pain (though improved with Imdur), I will start ranolazine 500 mg bid (QTc has been within normal limits).  I will get an ECG in 2 wks.  PVD WITH CLAUDICATION  Known severe PAD (see PMH). Symptoms stable, not at rest and no pedal ulcers. He has quit smoking. He is very limited by claudication.  He is on cilostazol and either Plavix or Brilinta (study drug).  He has seen Dr. Myra Gianotti, fem-pop bypass on right would be option if symptoms become intolerable. ABIs stable in 2/14.  HYPERLIPIDEMIA-MIXED LDL is reasonable on pravastatin 80 mg daily.  TGs were still high on Lovaza.  He has stopped Lovaza and is now on Tricor.  Repeat lipids in 2 months.  Hypertension BP is under good control.   Followup in 1 month.   [name] 09/07/2012

## 2012-09-07 NOTE — Patient Instructions (Addendum)
Start ranexa(ranolazine) 500mg  every 12 hours.   Your physician recommends that you schedule an appointment for an EKG 2 weeks after you start ranexa (ranolazine).  Your physician recommends that you schedule a follow-up appointment in: 1 month with Dr Shirlee Latch.

## 2012-09-08 ENCOUNTER — Telehealth: Payer: Self-pay | Admitting: Cardiology

## 2012-09-08 NOTE — Telephone Encounter (Signed)
New Prob       Pt was put on new medication, but he can not start until next Wednesday. Would like to speak to nurse.

## 2012-09-21 ENCOUNTER — Encounter: Payer: Self-pay | Admitting: *Deleted

## 2012-09-21 ENCOUNTER — Ambulatory Visit (INDEPENDENT_AMBULATORY_CARE_PROVIDER_SITE_OTHER): Payer: Medicare HMO | Admitting: *Deleted

## 2012-09-21 VITALS — BP 130/84 | HR 77 | Wt 194.8 lb

## 2012-09-21 DIAGNOSIS — R079 Chest pain, unspecified: Secondary | ICD-10-CM

## 2012-09-21 MED ORDER — CILOSTAZOL 100 MG PO TABS: 100.0000 mg | ORAL_TABLET | Freq: Two times a day (BID) | ORAL | Status: DC

## 2012-09-21 MED ORDER — ISOSORBIDE MONONITRATE ER 30 MG PO TB24: 30.0000 mg | ORAL_TABLET | Freq: Two times a day (BID) | ORAL | Status: DC

## 2012-09-21 NOTE — Progress Notes (Signed)
Patient scheduled today for 2 week EKG follow up 2 weeks after starting Ranexa. Patient only started Ranexa 1 week ago secondary to finances. Patient did state that he had chest pains Saturday night that woke him, has daily chest pains but states they seem to be getting better with Ranexa. EKG done and reviewed by Dr Jens Som, no significant change. Per Dr Jens Som continue same dose of medications and return next week for EKG regarding Ranexa.

## 2012-09-28 ENCOUNTER — Ambulatory Visit (INDEPENDENT_AMBULATORY_CARE_PROVIDER_SITE_OTHER): Payer: Medicare HMO | Admitting: *Deleted

## 2012-09-28 VITALS — BP 126/80 | HR 76 | Resp 20

## 2012-09-28 DIAGNOSIS — R9431 Abnormal electrocardiogram [ECG] [EKG]: Secondary | ICD-10-CM

## 2012-09-28 NOTE — Progress Notes (Signed)
Patient arrived today for a 12 lead EKG.  Was started on Ranexa 500mg  2 times per day after office visit with Dr. Shirlee Latch on 5/6. States he feels better without any chest pain. Dr.McLean has reviewed his EKG and cleared him to continue taking Ranexa. Patient will follow up with Dr.McLean in a few weeks.

## 2012-10-18 ENCOUNTER — Other Ambulatory Visit: Payer: Self-pay | Admitting: *Deleted

## 2012-10-18 MED ORDER — ISOSORBIDE MONONITRATE ER 30 MG PO TB24: 30.0000 mg | ORAL_TABLET | Freq: Two times a day (BID) | ORAL | Status: DC

## 2012-10-19 ENCOUNTER — Encounter: Payer: Self-pay | Admitting: Cardiology

## 2012-10-19 ENCOUNTER — Other Ambulatory Visit (INDEPENDENT_AMBULATORY_CARE_PROVIDER_SITE_OTHER): Payer: Medicare HMO

## 2012-10-19 ENCOUNTER — Other Ambulatory Visit: Payer: Self-pay

## 2012-10-19 ENCOUNTER — Ambulatory Visit (INDEPENDENT_AMBULATORY_CARE_PROVIDER_SITE_OTHER): Payer: Medicare HMO | Admitting: Cardiology

## 2012-10-19 VITALS — BP 124/78 | HR 96 | Ht 70.0 in | Wt 193.0 lb

## 2012-10-19 DIAGNOSIS — E785 Hyperlipidemia, unspecified: Secondary | ICD-10-CM

## 2012-10-19 DIAGNOSIS — I2510 Atherosclerotic heart disease of native coronary artery without angina pectoris: Secondary | ICD-10-CM

## 2012-10-19 DIAGNOSIS — I7389 Other specified peripheral vascular diseases: Secondary | ICD-10-CM

## 2012-10-19 LAB — LIPID PANEL
Cholesterol: 207 mg/dL — ABNORMAL HIGH (ref 0–200)
HDL: 28.6 mg/dL — ABNORMAL LOW (ref >39.00)
Total CHOL/HDL Ratio: 7
Triglycerides: 759 mg/dL — ABNORMAL HIGH (ref 0.0–149.0)
VLDL: 151.8 mg/dL — ABNORMAL HIGH (ref 0.0–40.0)

## 2012-10-19 LAB — HEPATIC FUNCTION PANEL
Albumin: 4.1 g/dL (ref 3.5–5.2)
Alkaline Phosphatase: 34 U/L — ABNORMAL LOW (ref 39–117)

## 2012-10-19 MED ORDER — ISOSORBIDE MONONITRATE ER 30 MG PO TB24: 30.0000 mg | ORAL_TABLET | Freq: Two times a day (BID) | ORAL | Status: DC

## 2012-10-19 MED ORDER — RANOLAZINE ER 1000 MG PO TB12: 1000.0000 mg | ORAL_TABLET | Freq: Two times a day (BID) | ORAL | Status: DC

## 2012-10-19 NOTE — Patient Instructions (Addendum)
Increase Ranexa to 1000 mg two times a day.  Your physician recommends that you schedule a follow-up appointment in: 2 weeks for an EKG.   Your physician wants you to follow-up in: 6 months with Dr Shirlee Latch. (December 2014).  You will receive a reminder letter in the mail two months in advance. If you don't receive a letter, please call our office to schedule the follow-up appointment.

## 2012-10-21 NOTE — Progress Notes (Signed)
Patient ID: [name], male   DOB: 1956-02-16, 57 y.o.   MRN: [id number] PCP: Felicity Coyer  57 yo with history of CAD, severe PAD, and diabetes presents for followup. He had NSTEMI in 6/09 that was treated medically as patient refused catheterization due to financial issues. He also has severe claudication. Patient had catheterization in 2/11 showing totally occluded circumflex artery with some collaterals as well as severe PAD (see PMH for description).  He has seen Dr. Myra Gianotti and plan for now is observation with the option of fem-pop bypass down the road.   Patient still has right leg/foot pain after walking 10-20 feet.  He does not have any ulcers on his feet.  This is stable.  ABIs in 2/14 were stable.  He continues to abstain from smoking.  He also has left hip pain from osteoarthritis.  He has been having anginal pain.  I had him do a Lexiscan Sestamibi in 4/14.  This showed a primarily fixed lateral defect consistent with prior lateral MI but no significant ischemia.  I also started him on Imdur 30 mg daily.  This has been increased to 30 mg bid.  At last appointment, I started him on ranolazine.  He is not having as much chest pain now.  He has taken NTG only once since last appointment.  He has noted a substantial improvement: he will have a mild chest pain episode 2-3 times/week now.  Also of note, he developed worsening joint pains on fenofibrate so stopped it.   Labs (3/11): HDL 20 => 36, LDL 72, TGs 4098 => 448  Labs (2/11): K 4.4, creatinine 0.8  Labs (6/13): TGs 539, HDL 33, LDL 84, K 4.7, creatinine 1.2 Labs (4/14): K 4.2, creatinine 1.0, TGs 553, LDL 79, HDL 31  Allergies (verified):  No Known Drug Allergies   Past Medical History:  1. Non-Hodgkin lymphoma. This was treated about 10 years ago with radiation, it is now in remission. He had it in his axillary area and inguinal areas.  2. Formerly morbidly obese, but lost 100 pounds by diet and exercise.  3. Hypertension.  4.  Hypercholesterolemia.  Unable to tolerate fenofibrate due to joint pains. Myalgias with atorvastatin.  5. History of alcohol abuse.  6. Current smoker. He smokes a pack a day. ? COPD.  7. Coronary artery disease. The patient did have a non-ST-elevation MI in June 2009 that was treated medically. The patient refused LHC at that time. LHC done 2/11 showed totally occluded CFX with faint R->L collaterals, 40-50% proximal LAD, patent RCA. Renal arteries were patent.  Lexiscan Sestamibi (4/14) with EF 61%, primarily fixed medium-sized basal to mid inferolateral perfusion defect suggestive of prior MI with minimal ischemia.  8. Echocardiogram done in June 2009 showed an EF of 60%, mild LVH, no regional wall motion abnormalities.  9. PAD. He underwent ABIs and lower extremity duplex scanning on February 18, 2008. This demonstrated occlusion of the distal right superficial femoral artery with reconstitution at the level of the popliteal artery. There appeared to be some degree of inflow disease because of biphasic common femoral waveforms. The ABI was 0.49 on the right and 0.87 on the left. Peripheral angiogram (2/11) showed 50% L CIA stenosis, 90% stenosis of L SFA and popliteal, totally occluded R SFA with reconstitution of popliteal. Fem-pop bypass would be option => he has seen Dr. Myra Gianotti, would operate for pain at rest or tissue ischemia.  ABIs (2/14) with 0.66 on right, 0.69 on left.  10.  Type II diabetes: Peripheral edema with Actos.   Family History:  The patient's mother had a myocardial infarction in her 38s. He had 2 aunts with myocardial infarctions in their 20s. His father had peripheral arterial disease. No diabetes in his immediate family.   Social History:  The patient lives with his wife. He is an unemployed Investment banker, operational, originally from [place]. He has no children. He quit smoking in 10/13. He used to drink heavily but has quit. He is on disability.   Review of Systems  All systems reviewed  and negative except as per HPI.   Current Outpatient Prescriptions  Medication Sig Dispense Refill  . AMBULATORY NON FORMULARY MEDICATION Medication Name: EUCLID STUDY DRUG-Plavix vs Brilinta      . cilostazol (PLETAL) 100 MG tablet Take 1 tablet (100 mg total) by mouth 2 (two) times daily.  180 tablet  0  . Diphenhydramine-Acetaminophen (PERCOGESIC PO) Take 2 tablets by mouth 2 (two) times daily as needed. For dental pain      . glimepiride (AMARYL) 4 MG tablet Take 1 tablet (4 mg total) by mouth daily before breakfast.  90 tablet  1  . glucose blood (TRUETEST TEST) test strip Use as instructed to check blood sugar twice daily dx 250.00  200 each  3  . isosorbide mononitrate (IMDUR) 30 MG 24 hr tablet Take 1 tablet (30 mg total) by mouth 2 (two) times daily.  180 tablet  3  . lisinopril (PRINIVIL,ZESTRIL) 40 MG tablet Take 1 tablet (40 mg total) by mouth daily.  90 tablet  1  . metFORMIN (GLUCOPHAGE-XR) 500 MG 24 hr tablet Take 1,000 mg by mouth 2 (two) times daily.      . metoprolol (LOPRESSOR) 50 MG tablet Take 1 tablet (50 mg total) by mouth 2 (two) times daily.  180 tablet  1  . pravastatin (PRAVACHOL) 80 MG tablet Take 1 tablet (80 mg total) by mouth every evening.  90 tablet  0  . TRUEPLUS LANCETS 28G MISC 1 each by Does not apply route 2 (two) times daily. Dx 250.00  200 each  3  . ranolazine (RANEXA) 1000 MG SR tablet Take 1 tablet (1,000 mg total) by mouth 2 (two) times daily.  60 tablet  11   No current facility-administered medications for this visit.    BP 124/78  Pulse 96  Ht 5\' 10"  (1.778 m)  Wt 193 lb (87.544 kg)  BMI 27.69 kg/m2  SpO2 96% General: NAD Neck: No JVD, no thyromegaly or thyroid nodule.  Lungs: Distant breath sounds.  CV: Nondisplaced PMI.  Heart regular S1/S2, no S3/S4, 1/6 SEM.  No peripheral edema.  No carotid bruit.  Unable to palpate DP or PT pulses on either leg. Abdomen: Soft, nontender, no hepatosplenomegaly, no distention.   Neurologic: Alert and  oriented x 3.  Psych: Normal affect. Extremities: No clubbing or cyanosis.   Assessment/Plan:  CAD, NATIVE VESSEL  Known CAD.  He is on a study drug, either Plavix or Brilinta. He will continue ACEI, beta blocker, and statin.  Lexiscan Sestamibi was suggestive of prior LCx-territory MI (totally occluded LCx on last cath).  For angina, I will continue Imdur and ranolazine.  He got considerably improvement with ranolazine, so I will increase this to 1000 mg bid today.  He will need an ECG to follow QT interval in 2 wks.  ECG on 500 mg bid ranolazine did not show a significantly prolonged QT interval.  PVD WITH CLAUDICATION  Known severe PAD (  see PMH). Symptoms stable, not at rest and no pedal ulcers. He has quit smoking. He is very limited by claudication.  He is on cilostazol and either Plavix or Brilinta (study drug).  He has seen Dr. Myra Gianotti, fem-pop bypass on right would be option if symptoms become intolerable. ABIs stable in 2/14.  HYPERLIPIDEMIA-MIXED LDL is reasonable on pravastatin 80 mg daily.  TGs were still high on Lovaza.  He could not tolerate fenofibrate.  He has been watching his diet.  Will repeat lipids today. Hypertension BP is under good control.   Followup in  6 months.   [name] 10/21/2012

## 2012-10-28 ENCOUNTER — Telehealth: Payer: Self-pay | Admitting: *Deleted

## 2012-10-28 NOTE — Telephone Encounter (Signed)
Advised patient  Notes Recorded by Laurey Morale, MD on 10/27/2012 at 1:51 AM Unfortunately not other good options. He can't take fenofibrate. He can try over the counter fish oil, would take a total of 4 grams/day. He needs to watch the fat in his diet as well.    Notes Recorded by Jacqlyn Krauss, RN on 10/26/2012 at 9:15 AM Spoke with patient. Pt states Lovaza is expensive for him, particularly when he is in the "doughnut hole" and he has to pay the entire cost. Pt is asking if there are other options. I will forward to Dr Shirlee Latch for further review. Notes Recorded by Laurey Morale, MD on 10/23/2012 at 10:19 PM Good LDL but TGs still way too high. Restart Lovaza 2 g bid. He cannot take fenofibrate. Notes Recorded by Jacqlyn Krauss, RN on 10/19/2012 at 5:39 PM Preliminarily reviewed by Triage. Awaiting MD review and signature.

## 2012-10-28 NOTE — Telephone Encounter (Signed)
Message copied by Burnell Blanks on Thu Oct 28, 2012  8:24 AM ------      Message from: Laurey Morale      Created: Wed Oct 27, 2012  1:51 AM       Unfortunately not other good options.  He can't take fenofibrate.  He can try over the counter fish oil, would take a total of 4 grams/day.  He needs to watch the fat in his diet as well.              ------

## 2012-11-02 ENCOUNTER — Ambulatory Visit (INDEPENDENT_AMBULATORY_CARE_PROVIDER_SITE_OTHER): Payer: Medicare HMO | Admitting: *Deleted

## 2012-11-02 ENCOUNTER — Other Ambulatory Visit: Payer: Self-pay | Admitting: *Deleted

## 2012-11-02 VITALS — Ht 70.0 in | Wt 193.0 lb

## 2012-11-02 DIAGNOSIS — Z79899 Other long term (current) drug therapy: Secondary | ICD-10-CM

## 2012-11-02 DIAGNOSIS — M79609 Pain in unspecified limb: Secondary | ICD-10-CM

## 2012-11-09 ENCOUNTER — Other Ambulatory Visit: Payer: Self-pay | Admitting: *Deleted

## 2012-11-09 DIAGNOSIS — I2510 Atherosclerotic heart disease of native coronary artery without angina pectoris: Secondary | ICD-10-CM

## 2012-11-09 DIAGNOSIS — E785 Hyperlipidemia, unspecified: Secondary | ICD-10-CM

## 2012-11-09 MED ORDER — PRAVASTATIN SODIUM 80 MG PO TABS: 80.0000 mg | ORAL_TABLET | Freq: Every evening | ORAL | Status: DC

## 2012-11-10 NOTE — Progress Notes (Signed)
PT HERE FOR EKG ./CY

## 2012-11-12 ENCOUNTER — Encounter: Payer: Self-pay | Admitting: Surgery

## 2012-11-15 ENCOUNTER — Ambulatory Visit (INDEPENDENT_AMBULATORY_CARE_PROVIDER_SITE_OTHER): Payer: Medicare HMO | Admitting: Surgery

## 2012-11-15 ENCOUNTER — Encounter (INDEPENDENT_AMBULATORY_CARE_PROVIDER_SITE_OTHER): Payer: Medicare HMO | Admitting: *Deleted

## 2012-11-15 ENCOUNTER — Other Ambulatory Visit: Payer: Self-pay | Admitting: *Deleted

## 2012-11-15 ENCOUNTER — Encounter: Payer: Self-pay | Admitting: Surgery

## 2012-11-15 VITALS — BP 122/88 | HR 88 | Ht 70.0 in | Wt 191.3 lb

## 2012-11-15 DIAGNOSIS — M79609 Pain in unspecified limb: Secondary | ICD-10-CM

## 2012-11-15 DIAGNOSIS — I739 Peripheral vascular disease, unspecified: Secondary | ICD-10-CM

## 2012-11-15 NOTE — Progress Notes (Signed)
[facility] [place]  Established Intermittent Claudication  History of Present Illness  [name] is a 57 y.o. (11-30-1955) male who presents with chief complaint: numbness and weak tired feels with walking.  He does not have pain in his calf's with walking..  The patient's symptoms have not progressed.  The patient's symptoms are: weakness and tiredness. The patient's treatment regimen currently included: maximal medical management which includes Pletal and walking for exercise which is currently restricted secondary to his right hip pain.  He will have hip replacement in the near future. He continues to be on a statin for hypercholesterolemia.  Past Medical History, Past Surgical History, Social History, Family History, Medications, Allergies, and Review of Systems are unchanged from previous evaluation on 03/15/2012.  Current Outpatient Prescriptions on File Prior to Visit  Medication Sig Dispense Refill  . AMBULATORY NON FORMULARY MEDICATION Medication Name: EUCLID STUDY DRUG-Plavix vs Brilinta      . cilostazol (PLETAL) 100 MG tablet Take 1 tablet (100 mg total) by mouth 2 (two) times daily.  180 tablet  0  . Diphenhydramine-Acetaminophen (PERCOGESIC PO) Take 2 tablets by mouth 2 (two) times daily as needed. For dental pain      . fish oil-omega-3 fatty acids 1000 MG capsule Take 4 g by mouth daily.      Marland Kitchen glimepiride (AMARYL) 4 MG tablet Take 1 tablet (4 mg total) by mouth daily before breakfast.  90 tablet  1  . glucose blood (TRUETEST TEST) test strip Use as instructed to check blood sugar twice daily dx 250.00  200 each  3  . isosorbide mononitrate (IMDUR) 30 MG 24 hr tablet Take 1 tablet (30 mg total) by mouth 2 (two) times daily.  180 tablet  3  . lisinopril (PRINIVIL,ZESTRIL) 40 MG tablet Take 1 tablet (40 mg total) by mouth daily.  90 tablet  1  . metFORMIN (GLUCOPHAGE-XR) 500 MG 24 hr tablet Take 1,000 mg by mouth 2 (two) times daily.      . metoprolol  (LOPRESSOR) 50 MG tablet Take 1 tablet (50 mg total) by mouth 2 (two) times daily.  180 tablet  1  . pravastatin (PRAVACHOL) 80 MG tablet Take 1 tablet (80 mg total) by mouth every evening.  90 tablet  1  . ranolazine (RANEXA) 1000 MG SR tablet Take 1 tablet (1,000 mg total) by mouth 2 (two) times daily.  60 tablet  11  . TRUEPLUS LANCETS 28G MISC 1 each by Does not apply route 2 (two) times daily. Dx 250.00  200 each  3   No current facility-administered medications on file prior to visit.   Physical Examination  Filed Vitals:   11/15/12 1332  BP: 122/88  Pulse: 88  Height: 5\' 10"  (1.778 m)  Weight: 191 lb 4.8 oz (86.773 kg)  SpO2: 96%   Body mass index is 27.45 kg/(m^2).  General: A&O x 3, WDWN Pulmonary: Sym exp, good air movt, CTAB, no rales, rhonchi, & wheezing  Cardiac: RRR, Vascular: Vessel Right Left  Radial Palpable Palpable  Ulnar Palpable Palpable  Brachial Palpable Palpable  Carotid Palpable, without bruit Palpable, without bruit  Aorta Not palpable N/A  Femoral Palpable Palpable  Popliteal Not palpable Not palpable  PT not Palpable not Palpable  DP not Palpable not Palpable   Musculoskeletal: M/S 5/5 throughout  except right hip motion is restricted internal and external, Extremities without ischemic changes.  No ulcers noted on feet of legs.  Neurologic: Pain and light  touch intact in extremities   except feet decreased sensation  Motor exam as listed above  Non-Invasive Vascular Imaging ABI (Date: 11/15/2012)  RLE: 0.67  LLE: 0.79    Medical Decision Making  BARUC TUGWELL is a 57 y.o. male who presents with: history of leg intermittent claudication without evidence of critical limb ischemia.  Based on the patient's vascular studies and examination, I have offered the patient: a one year follow up with ABI's.  I discussed in depth with the patient the nature of atherosclerosis, and emphasized the importance of maximal medical management including  strict control of blood pressure, blood glucose, and lipid levels, antiplatelet agents, obtaining regular exercise, and cessation of smoking.  He continues to smoke dailyThe patient is aware that without maximal medical management the underlying atherosclerotic disease process will progress, limiting the benefit of any interventions.  He was seen by Dr. Myra Gianotti today  Thomasena Edis, [facility] PA-C [facility] [place] Office: [phone]   11/15/2012, 2:04 PM   I agree with the above. The patient's peripheral vascular disease has been stable over the course of the past year. His ankle brachial indices have not changed significantly. His biggest limitation of activity is his right hip pain. I'll have followup in one year with repeat ultrasound  Wells [facility]

## 2012-11-23 ENCOUNTER — Other Ambulatory Visit: Payer: Self-pay | Admitting: *Deleted

## 2012-11-23 ENCOUNTER — Encounter: Payer: Self-pay | Admitting: Internal Medicine

## 2012-11-23 ENCOUNTER — Other Ambulatory Visit: Payer: Self-pay | Admitting: Cardiology

## 2012-11-23 MED ORDER — GLIMEPIRIDE 4 MG PO TABS: 4.0000 mg | ORAL_TABLET | Freq: Every day | ORAL | Status: DC

## 2012-11-23 MED ORDER — METFORMIN HCL ER 500 MG PO TB24: 1000.0000 mg | ORAL_TABLET | Freq: Two times a day (BID) | ORAL | Status: DC

## 2012-11-23 MED ORDER — CILOSTAZOL 100 MG PO TABS: 100.0000 mg | ORAL_TABLET | Freq: Two times a day (BID) | ORAL | Status: DC

## 2012-11-23 NOTE — Telephone Encounter (Signed)
Pt sent email needing refill on his glimepiride & metformin. Sending to right source...lmb

## 2012-11-29 ENCOUNTER — Other Ambulatory Visit (INDEPENDENT_AMBULATORY_CARE_PROVIDER_SITE_OTHER): Payer: Medicare HMO

## 2012-11-29 ENCOUNTER — Ambulatory Visit (INDEPENDENT_AMBULATORY_CARE_PROVIDER_SITE_OTHER): Payer: Medicare HMO | Admitting: Internal Medicine

## 2012-11-29 ENCOUNTER — Encounter: Payer: Self-pay | Admitting: Internal Medicine

## 2012-11-29 VITALS — BP 142/84 | HR 87 | Temp 98.2°F | Wt 192.4 lb

## 2012-11-29 DIAGNOSIS — I10 Essential (primary) hypertension: Secondary | ICD-10-CM

## 2012-11-29 DIAGNOSIS — I739 Peripheral vascular disease, unspecified: Secondary | ICD-10-CM

## 2012-11-29 DIAGNOSIS — E119 Type 2 diabetes mellitus without complications: Secondary | ICD-10-CM

## 2012-11-29 DIAGNOSIS — E785 Hyperlipidemia, unspecified: Secondary | ICD-10-CM

## 2012-11-29 DIAGNOSIS — M169 Osteoarthritis of hip, unspecified: Secondary | ICD-10-CM

## 2012-11-29 DIAGNOSIS — M1611 Unilateral primary osteoarthritis, right hip: Secondary | ICD-10-CM

## 2012-11-29 LAB — HEMOGLOBIN A1C: Hgb A1c MFr Bld: 6.4 % (ref 4.6–6.5)

## 2012-11-29 NOTE — Assessment & Plan Note (Addendum)
RLE ABI 03/2012 reviewed - no significant change Advised to stop smoking (reports last cigarette 05/2012, but heavy 2nd smoke exposure) continue med mgmt and follow up with [facility] and cards as needed if [facility] felt necessary for pain control

## 2012-11-29 NOTE — Assessment & Plan Note (Signed)
BP Readings from Last 3 Encounters:  11/29/12 142/84  11/15/12 122/88  10/19/12 124/78   The current medical regimen is generally effective;  continue present plan and medications.

## 2012-11-29 NOTE — Assessment & Plan Note (Signed)
High TGs - no longer on Lovaza due to cost Continue prava as tolerated by MSkel pain (atorva caused SE) - last lipids reviewed Follow with cards as ongoing 

## 2012-11-29 NOTE — Patient Instructions (Signed)
It was good to see you today. We have reviewed your prior records including labs and tests today Test(s) ordered today. Your results will be released to MyChart (or called to you) after review, usually within 72hours after test completion. If any changes need to be made, you will be notified at that same time. Please schedule followup in 4-6 months for diabetes mellitus and blood pressure check, call sooner if problems.

## 2012-11-29 NOTE — Assessment & Plan Note (Signed)
Edema with Actos (SE) and Januvia ineffective +$$ prohib On amaryl + metformin Plans to follow with me (PCP) rather than endo On ASA, statin and ACEI Check ac now and q18mo, sooner if needed The current medical regimen is effective;  continue present plan and medications. Lab Results  Component Value Date   HGBA1C 7.1* 04/09/2012

## 2012-11-29 NOTE — Progress Notes (Signed)
Subjective:    Patient ID: [name], male    DOB: Dec 14, 1955, 57 y.o.   MRN: [id number]  HPI  Here for follow up - reviewed chronic medical issues -  DM2 - on Amaryl and metformin - check cbgs at home, no hypoglycemic events - prior actos caused SE and felt Januvia "did nothing" and was $ prohibitive  PAD - BLE claudication pain - works with cards on same - s/p vasc eval 12/2011 ([facility]) - recommended against fem pop at this time or other revascularization  hypertension - If you develop worsening symptoms or fever, call and we can reconsider antibiotics, but it does not appear necessary to use antibiotics at this time.  dyslipidemia - on atorva, holding x 1 week due to MSkel pain - the patient reports compliance with medication(s) as prescribed. Denies adverse side effects.  R hip pain - improved lateral pain since trochanteric bursa injection 11/21 - still with intermittent groin pain, improved with cortisone into groin at [facility] ([facility]) -    Past Medical History  Diagnosis Date  . Atherosclerosis of native arteries of the extremities with rest pain   . Coronary atherosclerosis of native coronary artery   . PAD (peripheral artery disease)   . DM type 2 (diabetes mellitus, type 2)   . Other and unspecified hyperlipidemia   . Unspecified essential hypertension   . Tobacco use disorder   . H/O alcohol abuse     quit 2011 - dry since that time  . H/O: obesity     lost 100lbs  . Non Hodgkin's lymphoma 2000    tx with XRT at [facility], in remission  . Myocardial infarction June 9,2009  . Osteoarthritis     right hip    Review of Systems  Constitutional: Negative for fever or weight change.  Respiratory: Negative for cough and shortness of breath.   Cardiovascular: Negative for change in chest pain; no palpitations.      Objective:   Physical Exam  BP 142/84  Pulse 87  Temp(Src) 98.2 F (36.8 C) (Oral)  Wt 192 lb 6.4 oz (87.272 kg)  BMI 27.61 kg/m2  SpO2  96% Weight: 192 lb 6.4 oz (87.272 kg)  Constitutional:  He appears well-developed and well-nourished. No distress. Neck: Normal range of motion. Neck supple. No JVD present. No thyromegaly present.  Cardiovascular: Normal rate, regular rhythm and normal heart sounds.  No murmur heard. no BLE edema Pulmonary/Chest: Effort normal and breath sounds normal. No respiratory distress. no wheezes.  Psychiatric: he has a normal mood and affect. behavior is normal. Judgment and thought content normal.   Lab Results  Component Value Date   WBC 9.1 10/28/2011   HGB 16.9 10/28/2011   HCT 46.6 10/28/2011   PLT 211 10/28/2011   GLUCOSE 189* 08/17/2012   CHOL 207* 10/19/2012   TRIG 759.0 Triglyceride is over 400; calculations on Lipids are invalid.* 10/19/2012   HDL 28.60* 10/19/2012   LDLDIRECT 77.7 10/19/2012   LDLCALC  Value: 111        Total Cholesterol/HDL:CHD Risk Coronary Heart Disease Risk Table                     Men   Women  1/2 Average Risk   3.4   3.3* 10/13/2007   ALT 17 10/19/2012   AST 18 10/19/2012   NA 132* 08/17/2012   K 4.2 08/17/2012   CL 100 08/17/2012   CREATININE 1.0 08/17/2012   BUN 13  08/17/2012   CO2 23 08/17/2012   TSH 1.10 10/16/2011   PSA 0.61 10/16/2011   INR 1.0 ratio 06/11/2009   HGBA1C 7.1* 04/09/2012   MICROALBUR 1.5 10/16/2011        Assessment & Plan:    Also See problem list. Medications and labs reviewed today.

## 2012-11-29 NOTE — Assessment & Plan Note (Signed)
Onset 03/2012 - pain laterally improved with bursa injection but persisting groin pain with ambulation S/p ortho eval ([facility]) -recommended THR, but pt concerned due to RLE vascular issues Add tramadol to Tylenol - unable to use NSAIDs due to antiplt trial study med for PAD S/p intraarticular hip injection trial prior to [facility] given high surg risk -  encouraged to discuss with ortho (now following with [facility] and [facility] at [facility])

## 2012-11-30 LAB — MICROALBUMIN / CREATININE URINE RATIO: Microalb Creat Ratio: 2.1 mg/g (ref 0.0–30.0)

## 2012-11-30 LAB — URINALYSIS, ROUTINE W REFLEX MICROSCOPIC
Bilirubin Urine: NEGATIVE
Hgb urine dipstick: NEGATIVE
Leukocytes, UA: NEGATIVE
Nitrite: NEGATIVE
Total Protein, Urine: NEGATIVE
pH: 5.5 (ref 5.0–8.0)

## 2013-01-16 ENCOUNTER — Encounter: Payer: Self-pay | Admitting: Internal Medicine

## 2013-01-17 ENCOUNTER — Other Ambulatory Visit: Payer: Self-pay | Admitting: *Deleted

## 2013-01-17 MED ORDER — METFORMIN HCL ER 500 MG PO TB24: 1000.0000 mg | ORAL_TABLET | Freq: Two times a day (BID) | ORAL | Status: DC

## 2013-01-26 ENCOUNTER — Other Ambulatory Visit: Payer: Self-pay | Admitting: Cardiology

## 2013-02-28 LAB — HM DIABETES EYE EXAM

## 2013-03-10 ENCOUNTER — Other Ambulatory Visit: Payer: Self-pay

## 2013-03-22 ENCOUNTER — Ambulatory Visit (INDEPENDENT_AMBULATORY_CARE_PROVIDER_SITE_OTHER): Payer: Medicare HMO | Admitting: Cardiology

## 2013-03-22 ENCOUNTER — Encounter: Payer: Self-pay | Admitting: Cardiology

## 2013-03-22 VITALS — BP 130/84 | HR 74 | Ht 70.0 in | Wt 197.0 lb

## 2013-03-22 DIAGNOSIS — I7389 Other specified peripheral vascular diseases: Secondary | ICD-10-CM

## 2013-03-22 DIAGNOSIS — E785 Hyperlipidemia, unspecified: Secondary | ICD-10-CM

## 2013-03-22 DIAGNOSIS — I2510 Atherosclerotic heart disease of native coronary artery without angina pectoris: Secondary | ICD-10-CM

## 2013-03-22 LAB — BASIC METABOLIC PANEL
BUN: 11 mg/dL (ref 6–23)
CO2: 23 mEq/L (ref 19–32)
Calcium: 9.8 mg/dL (ref 8.4–10.5)
GFR: 89.69 mL/min (ref >60.00)
Potassium: 4.3 mEq/L (ref 3.5–5.1)
Sodium: 133 mEq/L — ABNORMAL LOW (ref 135–145)

## 2013-03-22 LAB — HEPATIC FUNCTION PANEL
ALT: 18 U/L (ref 0–53)
Alkaline Phosphatase: 40 U/L (ref 39–117)
Total Bilirubin: 0.8 mg/dL (ref 0.3–1.2)
Total Protein: 7.5 g/dL (ref 6.0–8.3)

## 2013-03-22 LAB — LIPID PANEL
HDL: 27.8 mg/dL — ABNORMAL LOW (ref >39.00)
Total CHOL/HDL Ratio: 8
Triglycerides: 993 mg/dL — ABNORMAL HIGH (ref 0.0–149.0)
VLDL: 198.6 mg/dL — ABNORMAL HIGH (ref 0.0–40.0)

## 2013-03-22 LAB — HEMOGLOBIN A1C: Hgb A1c MFr Bld: 6.6 % — ABNORMAL HIGH (ref 4.6–6.5)

## 2013-03-22 NOTE — Patient Instructions (Signed)
Your physician recommends that you have  lab work today--Lipid profile/liver profile/BMET/HGB A1c   Your physician wants you to follow-up in: 6 months with Dr Shirlee Latch. (May 2015).  You will receive a reminder letter in the mail two months in advance. If you don't receive a letter, please call our office to schedule the follow-up appointment.

## 2013-03-22 NOTE — Progress Notes (Signed)
Patient ID: [name], male   DOB: 01-24-1956, 57 y.o.   MRN: [id number] PCP: Felicity Coyer  57 yo with history of CAD, severe PAD, and diabetes presents for followup. He had NSTEMI in 6/09 that was treated medically as patient refused catheterization due to financial issues. He also has severe claudication. Patient had catheterization in 2/11 showing totally occluded circumflex artery with some collaterals as well as severe PAD (see PMH for description).  He has seen Dr. Myra Gianotti and plan for now is observation with the option of fem-pop bypass down the road.  Most recent ABIs were stable compared to the past.   Patient still has right leg/foot pain after walking 10-20 feet.  He does not have any ulcers on his feet.  If his legs hang down for a long time, his feet will start to hurt. These symptoms are stable.  He has not smoked for about 2 years now.  I had him do a Lexiscan Sestamibi in 4/14.  This showed a primarily fixed lateral defect consistent with prior lateral MI but no significant ischemia.  Lately he has actually been doing better in terms of chest pain.  He is not very active because of claudication but is also not taking Imdur or ranolazine (he has not needed them).  His diabetes is under better control.  His hip also feels better after getting a steroid injection.    ECG: NSR, old inferior MI  Labs (3/11): HDL 20 => 36, LDL 72, TGs 7829 => 448  Labs (2/11): K 4.4, creatinine 0.8  Labs (6/13): TGs 539, HDL 33, LDL 84, K 4.7, creatinine 1.2 Labs (4/14): K 4.2, creatinine 1.0, TGs 553, LDL 79, HDL 31 Labs (6/14): TGs 759 (not on Lovaza), HDL 28, LDL 78  Allergies (verified):  No Known Drug Allergies   Past Medical History:  1. Non-Hodgkin lymphoma. This was treated about 10 years ago with radiation, it is now in remission. He had it in his axillary area and inguinal areas.  2. Formerly morbidly obese, but lost 100 pounds by diet and exercise.  3. Hypertension.  4. Hypercholesterolemia.   Unable to tolerate fenofibrate due to joint pains. Myalgias with atorvastatin.  5. History of alcohol abuse.  6. Current smoker. He smokes a pack a day. ? COPD.  7. Coronary artery disease. The patient did have a non-ST-elevation MI in June 2009 that was treated medically. The patient refused LHC at that time. LHC done 2/11 showed totally occluded CFX with faint R->L collaterals, 40-50% proximal LAD, patent RCA. Renal arteries were patent.  Lexiscan Sestamibi (4/14) with EF 61%, primarily fixed medium-sized basal to mid inferolateral perfusion defect suggestive of prior MI with minimal ischemia.  8. Echocardiogram done in June 2009 showed an EF of 60%, mild LVH, no regional wall motion abnormalities.  9. PAD. He underwent ABIs and lower extremity duplex scanning on February 18, 2008. This demonstrated occlusion of the distal right superficial femoral artery with reconstitution at the level of the popliteal artery. There appeared to be some degree of inflow disease because of biphasic common femoral waveforms. The ABI was 0.49 on the right and 0.87 on the left. Peripheral angiogram (2/11) showed 50% L CIA stenosis, 90% stenosis of L SFA and popliteal, totally occluded R SFA with reconstitution of popliteal. Fem-pop bypass would be option => he has seen Dr. Myra Gianotti, would operate for pain at rest or tissue ischemia.  ABIs (2/14) with 0.66 on right, 0.69 on left.  ABIs (7/14) 0.67  on right, 0.79 on left.  10. Type II diabetes: Peripheral edema with Actos.   Family History:  The patient's mother had a myocardial infarction in her 52s. He had 2 aunts with myocardial infarctions in their 47s. His father had peripheral arterial disease. No diabetes in his immediate family.   Social History:  The patient lives with his wife. He is an unemployed Investment banker, operational, originally from [place]. He has no children. He quit smoking in 10/13. He used to drink heavily but has quit. He is on disability.   Review of Systems  All  systems reviewed and negative except as per HPI.   Current Outpatient Prescriptions  Medication Sig Dispense Refill  . AMBULATORY NON FORMULARY MEDICATION Medication Name: EUCLID STUDY DRUG-Plavix vs Brilinta      . cilostazol (PLETAL) 100 MG tablet Take 1 tablet (100 mg total) by mouth 2 (two) times daily.  180 tablet  1  . Diphenhydramine-Acetaminophen (PERCOGESIC PO) Take 2 tablets by mouth 2 (two) times daily as needed. For dental pain      . fish oil-omega-3 fatty acids 1000 MG capsule Take 4 g by mouth daily.      Marland Kitchen glimepiride (AMARYL) 4 MG tablet Take 1 tablet (4 mg total) by mouth daily before breakfast.  90 tablet  0  . glucose blood (TRUETEST TEST) test strip Use as instructed to check blood sugar twice daily dx 250.00  200 each  3  . isosorbide mononitrate (IMDUR) 30 MG 24 hr tablet Take 30 mg by mouth as needed.      Marland Kitchen lisinopril (PRINIVIL,ZESTRIL) 40 MG tablet Take 1 tablet (40 mg total) by mouth daily.  90 tablet  1  . metFORMIN (GLUCOPHAGE-XR) 500 MG 24 hr tablet Take 2 tablets (1,000 mg total) by mouth 2 (two) times daily.  360 tablet  2  . metoprolol (LOPRESSOR) 50 MG tablet TAKE 1 TABLET TWICE DAILY  180 tablet  1  . pravastatin (PRAVACHOL) 80 MG tablet Take 1 tablet (80 mg total) by mouth every evening.  90 tablet  1  . TRUEPLUS LANCETS 28G MISC 1 each by Does not apply route 2 (two) times daily. Dx 250.00  200 each  3  . ranolazine (RANEXA) 1000 MG SR tablet Take 1 tablet (1,000 mg total) by mouth 2 (two) times daily.  60 tablet  11   No current facility-administered medications for this visit.    BP 130/84  Pulse 74  Ht 5\' 10"  (1.778 m)  Wt 197 lb (89.359 kg)  BMI 28.27 kg/m2  SpO2 93% General: NAD Neck: No JVD, no thyromegaly or thyroid nodule.  Lungs: Distant breath sounds.  CV: Nondisplaced PMI.  Heart regular S1/S2, no S3/S4, 1/6 SEM.  No peripheral edema.  No carotid bruit.  Unable to palpate DP or PT pulses on either leg. Abdomen: Soft, nontender, no  hepatosplenomegaly, no distention.   Neurologic: Alert and oriented x 3.  Psych: Normal affect. Extremities: No clubbing or cyanosis.   Assessment/Plan:  CAD, NATIVE VESSEL  Known CAD.  He is on a study drug, either Plavix or Brilinta. He will continue ACEI, beta blocker, and statin.  Lexiscan Sestamibi in 4/14 was suggestive of prior LCx-territory MI (totally occluded LCx on last cath).  He has not had significant angina recently and is not taking ranolazine or Imdur.  PVD WITH CLAUDICATION  Known severe PAD (see PMH). Symptoms stable, not at rest (unless his feet are dangling for a period of time) and no pedal  ulcers. He quit smoking about 2 yrs ago. He is very limited by claudication.  He is on cilostazol and either Plavix or Brilinta (study drug).  He has seen Dr. Myra Gianotti, fem-pop bypass on right would be option if symptoms become intolerable. ABIs stable in 7/14.  HYPERLIPIDEMIA-MIXED LDL is reasonable on pravastatin 80 mg daily.  TGs were still high.  He could not tolerate fenofibrate.  Lovaza was too expensive.  He has been on over-the-counter fish oil 2 g bid, will check lipids today.  Hypertension BP is under good control.   Followup in  6 months.   [name] 03/22/2013

## 2013-03-25 ENCOUNTER — Encounter: Payer: Self-pay | Admitting: Internal Medicine

## 2013-03-25 ENCOUNTER — Ambulatory Visit (INDEPENDENT_AMBULATORY_CARE_PROVIDER_SITE_OTHER): Payer: Medicare HMO | Admitting: Internal Medicine

## 2013-03-25 VITALS — BP 130/82 | HR 76 | Temp 97.2°F | Wt 196.0 lb

## 2013-03-25 DIAGNOSIS — I10 Essential (primary) hypertension: Secondary | ICD-10-CM

## 2013-03-25 DIAGNOSIS — Z1211 Encounter for screening for malignant neoplasm of colon: Secondary | ICD-10-CM

## 2013-03-25 DIAGNOSIS — I7389 Other specified peripheral vascular diseases: Secondary | ICD-10-CM

## 2013-03-25 DIAGNOSIS — E119 Type 2 diabetes mellitus without complications: Secondary | ICD-10-CM

## 2013-03-25 NOTE — Progress Notes (Signed)
Subjective:    Patient ID: [name], male    DOB: [birth year], 57 y.o.   MRN: [id number]  HPI Here for follow up - reviewed chronic medical issues -  DM2 - on Amaryl and metformin - check cbgs at home, no hypoglycemic events - prior actos caused side effects and felt Januvia "did nothing" and was $ prohibitive  PAD - BLE claudication pain but no rest pain - works with cards on same - s/p vasc eval 12/2011 ([facility]) - recommended against fem pop at this time or other revascularization  hypertension - If you develop worsening symptoms or fever, call and we can reconsider antibiotics, but it does not appear necessary to use antibiotics at this time.  dyslipidemia - on statin - tricor stopped due to MSkel pain - the patient reports compliance with medication(s) as prescribed. Denies adverse side effects.  R hip/leg pain, chronic - improved lateral pain since trochanteric bursa injection 11/12 - still with intermittent leg pain felt related to djd and pvd, improved with cortisone into groin at [facility] 2013 ([place]) -    Past Medical History  Diagnosis Date  . Atherosclerosis of native arteries of the extremities with rest pain   . Coronary atherosclerosis of native coronary artery   . PAD (peripheral artery disease)   . DM type 2 (diabetes mellitus, type 2)   . Other and unspecified hyperlipidemia   . Unspecified essential hypertension   . Tobacco use disorder   . H/O alcohol abuse     quit 2011 - dry since that time  . H/O: obesity     lost 100lbs  . Non Hodgkin's lymphoma 2000    tx with XRT at [facility], in remission  . Myocardial infarction June 9,2009  . Osteoarthritis     right hip    Review of Systems Constitutional: Negative for fever or weight change.  Respiratory: Negative for cough and shortness of breath.   Cardiovascular: Negative for chest pain or palpitations.      Objective:   Physical Exam BP 130/82  Pulse 76  Temp(Src) 97.2 F (36.2 C) (Oral)  Wt  196 lb (88.905 kg)  SpO2 95% Wt Readings from Last 3 Encounters:  03/25/13 196 lb (88.905 kg)  03/22/13 197 lb (89.359 kg)  11/29/12 192 lb 6.4 oz (87.272 kg)   Constitutional:  He appears well-developed and well-nourished. No distress. Heavy smoke smell Neck: Normal range of motion. Neck supple. No JVD present. No thyromegaly present.  Cardiovascular: Normal rate, regular rhythm and normal heart sounds.  No murmur heard. no BLE edema Pulmonary/Chest: Effort normal and breath sounds normal. No respiratory distress. no wheezes.  Psychiatric: he has a normal mood and affect. behavior is normal. Judgment and thought content normal.   Lab Results  Component Value Date   WBC 9.1 10/28/2011   HGB 16.9 10/28/2011   HCT 46.6 10/28/2011   PLT 211 10/28/2011   GLUCOSE 189* 03/22/2013   CHOL 231* 03/22/2013   TRIG 993.0 Triglyceride is over 400; calculations on Lipids are invalid.* 03/22/2013   HDL 27.80* 03/22/2013   LDLDIRECT 80.6 03/22/2013   LDLCALC  Value: 111        Total Cholesterol/HDL:CHD Risk Coronary Heart Disease Risk Table                     Men   Women  1/2 Average Risk   3.4   3.3* 10/13/2007   ALT 18 03/22/2013   AST 16 03/22/2013  NA 133* 03/22/2013   K 4.3 03/22/2013   CL 101 03/22/2013   CREATININE 0.9 03/22/2013   BUN 11 03/22/2013   CO2 23 03/22/2013   TSH 1.10 10/16/2011   PSA 0.61 10/16/2011   INR 1.0 ratio 06/11/2009   HGBA1C 6.6* 03/22/2013   MICROALBUR 3.1* 11/29/2012       Assessment & Plan:   See problem list. Medications and labs reviewed today.  Declines colo due to prep, but agrees to cologurd screening - will fax order

## 2013-03-25 NOTE — Assessment & Plan Note (Signed)
Known severe PAD (see PMH).  last ABI 11/2012 reviewed - no significant change Quit smoking (reports last cigarette 05/2012, but heavy 2nd smoke exposure) Symptoms stable, not at rest (unless his feet are dangling for a period of time) and no pedal ulcers.  on cilostazol and either Plavix or Brilinta (study drug).  Follows with [facility] Dr. Myra Gianotti: fem-pop bypass on right would be option if symptoms become intolerable.

## 2013-03-25 NOTE — Assessment & Plan Note (Signed)
BP Readings from Last 3 Encounters:  03/25/13 130/82  03/22/13 130/84  11/29/12 142/84   The current medical regimen is generally effective;  continue present plan and medications.

## 2013-03-25 NOTE — Assessment & Plan Note (Signed)
Edema with Actos (SE) and Januvia ineffective +$$ prohib On amaryl + metformin Plans to follow with me (PCP) rather than endo On ASA, statin and ACEI Check a1c q82mo, titrate as needed The current medical regimen is effective;  continue present plan and medications. Lab Results  Component Value Date   HGBA1C 6.6* 03/22/2013

## 2013-03-25 NOTE — Progress Notes (Signed)
Pre-visit discussion using our clinic review tool. No additional management support is needed unless otherwise documented below in the visit note.  

## 2013-03-25 NOTE — Patient Instructions (Addendum)
It was good to see you today.  We have reviewed your prior records including labs and tests today  Medications reviewed and updated, no changes recommended at this time.  We'll have you screened for colon cancer with COLOGAURD (stool DNA testing) - this packet will be sent to your home by the testing company. Complete instructions are in the box -we will contact you with the results once available.  Please schedule followup in 6 months for diabetes mellitus and blood pressure check, call sooner if problems.  Diabetes and Standards of Medical Care  Diabetes is complicated. You may find that your diabetes team includes a dietitian, nurse, diabetes educator, eye doctor, and more. To help everyone know what is going on and to help you get the care you deserve, the following schedule of care was developed to help keep you on track. Below are the tests, exams, vaccines, medicines, education, and plans you will need. HbA1c test This test shows how well you have controlled your glucose over the past 2 3 months. It is used to see if your diabetes management plan needs to be adjusted.   It is performed at least 2 times a year if you are meeting treatment goals.  It is performed 4 times a year if therapy has changed or if you are not meeting treatment goals. Blood pressure test  This test is performed at every routine medical visit. The goal is less than 140/90 mmHg for most people, but 130/80 mmHg in some cases. Ask your health care provider about your goal. Dental exam  Follow up with the dentist regularly. Eye exam  If you are diagnosed with type 1 diabetes as a child, get an exam upon reaching the age of 10 years or older and have had diabetes for 3 5 years. Yearly eye exams are recommended after that initial eye exam.  If you are diagnosed with type 1 diabetes as an adult, get an exam within 5 years of diagnosis and then yearly.  If you are diagnosed with type 2 diabetes, get an exam as soon  as possible after the diagnosis and then yearly. Foot care exam  Visual foot exams are performed at every routine medical visit. The exams check for cuts, injuries, or other problems with the feet.  A comprehensive foot exam should be done yearly. This includes visual inspection as well as assessing foot pulses and testing for loss of sensation.  Check your feet nightly for cuts, injuries, or other problems with your feet. Tell your health care provider if anything is not healing. Kidney function test (urine microalbumin)  This test is performed once a year.  Type 1 diabetes: The first test is performed 5 years after diagnosis.  Type 2 diabetes: The first test is performed at the time of diagnosis.  A serum creatinine and estimated glomerular filtration rate (eGFR) test is done once a year to assess the level of chronic kidney disease (CKD), if present. Lipid profile (cholesterol, HDL, LDL, triglycerides)  Performed every 5 years for most people.  The goal for LDL is less than 100 mg/dL. If you are at high risk, the goal is less than 70 mg/dL.  The goal for HDL is 40 mg/dL 50 mg/dL for men and 50 mg/dL 60 mg/dL for women. An HDL cholesterol of 60 mg/dL or higher gives some protection against heart disease.  The goal for triglycerides is less than 150 mg/dL. Influenza vaccine, pneumococcal vaccine, and hepatitis B vaccine  The influenza vaccine is  recommended yearly.  The pneumococcal vaccine is generally given once in a lifetime. However, there are some instances when another vaccination is recommended. Check with your health care provider.  The hepatitis B vaccine is also recommended for adults with diabetes. Diabetes self-management education  Education is recommended at diagnosis and ongoing as needed. Treatment plan  Your treatment plan is reviewed at every medical visit. Document Released: 02/16/2009 Document Revised: 12/22/2012 Document Reviewed: 09/21/2012 [facility]  Patient Information 2014 [place], [place].

## 2013-05-23 ENCOUNTER — Other Ambulatory Visit: Payer: Self-pay | Admitting: Cardiology

## 2013-06-02 ENCOUNTER — Other Ambulatory Visit: Payer: Self-pay | Admitting: *Deleted

## 2013-06-02 MED ORDER — GLIMEPIRIDE 4 MG PO TABS: 4.0000 mg | ORAL_TABLET | Freq: Every day | ORAL | Status: DC

## 2013-06-21 ENCOUNTER — Other Ambulatory Visit: Payer: Self-pay

## 2013-06-21 MED ORDER — LISINOPRIL 40 MG PO TABS: 40.0000 mg | ORAL_TABLET | Freq: Every day | ORAL | Status: DC

## 2013-08-10 ENCOUNTER — Ambulatory Visit (INDEPENDENT_AMBULATORY_CARE_PROVIDER_SITE_OTHER): Payer: Commercial Managed Care - HMO | Admitting: Internal Medicine

## 2013-08-10 ENCOUNTER — Encounter: Payer: Self-pay | Admitting: Internal Medicine

## 2013-08-10 ENCOUNTER — Other Ambulatory Visit (INDEPENDENT_AMBULATORY_CARE_PROVIDER_SITE_OTHER): Payer: Self-pay

## 2013-08-10 VITALS — BP 122/72 | HR 48 | Temp 98.4°F | Wt 193.0 lb

## 2013-08-10 DIAGNOSIS — E119 Type 2 diabetes mellitus without complications: Secondary | ICD-10-CM

## 2013-08-10 DIAGNOSIS — R001 Bradycardia, unspecified: Secondary | ICD-10-CM

## 2013-08-10 DIAGNOSIS — I498 Other specified cardiac arrhythmias: Secondary | ICD-10-CM

## 2013-08-10 DIAGNOSIS — Z23 Encounter for immunization: Secondary | ICD-10-CM

## 2013-08-10 DIAGNOSIS — Z0000 Encounter for general adult medical examination without abnormal findings: Secondary | ICD-10-CM

## 2013-08-10 LAB — HEMOGLOBIN A1C: HEMOGLOBIN A1C: 6.7 % — AB (ref 4.6–6.5)

## 2013-08-10 LAB — BASIC METABOLIC PANEL
BUN: 15 mg/dL (ref 6–23)
CO2: 24 mEq/L (ref 19–32)
Calcium: 9.6 mg/dL (ref 8.4–10.5)
Chloride: 101 mEq/L (ref 96–112)
Creatinine, Ser: 1 mg/dL (ref 0.4–1.5)
GFR: 77.75 mL/min (ref >60.00)
Glucose, Bld: 153 mg/dL — ABNORMAL HIGH (ref 70–99)
POTASSIUM: 4.7 meq/L (ref 3.5–5.1)
SODIUM: 135 meq/L (ref 135–145)

## 2013-08-10 NOTE — Progress Notes (Signed)
Subjective:    Patient ID: [name], male    DOB: 06-15-1955, 58 y.o.   MRN: [id number]  HPI  Patient here today for annual exam.  Chronic medical issues also reviewed.  Pt under increased stress recently due to wife having MI late 07/2013.   DM2 - on Amaryl and metformin - check cbgs at home, no hypoglycemic events - prior actos caused side effects and felt Januvia "did nothing" and was $ prohibitive. Fasting blood sugars 100-120 - pt reports compliance with current therapy.   PAD - BLE claudication pain but no rest pain - works with cards on same - s/p vasc eval 12/2011 ([facility]) - recommended against fem pop at this time or other revascularization.     CAD - NSTEMI 6/09 treated medically; cath 2/11 with totally occluded circumflex with some collaterals. 4/14 myoview with fixed lateral defect but no ischemia.  Follows with cardiology Aundra Dubin)  hypertension - pt reports compliance with current medications.  Reports occasional orthostatic intolerance. No shortness or breath.   dyslipidemia - on statin - tricor stopped due to MSkel pain - the patient reports compliance with medication(s) as prescribed. Denies adverse side effects.   R hip/leg pain, chronic - improved lateral pain since trochanteric bursa injection 11/12 - still with intermittent leg pain felt related to djd and pvd, improved with cortisone into groin at [facility] 2013 ([place]) -   Past Medical History  Diagnosis Date  . Atherosclerosis of native arteries of the extremities with rest pain   . Coronary atherosclerosis of native coronary artery   . PAD (peripheral artery disease)   . DM type 2 (diabetes mellitus, type 2)   . Other and unspecified hyperlipidemia   . Unspecified essential hypertension   . Tobacco use disorder   . H/O alcohol abuse     quit 2011 - dry since that time  . H/O: obesity     lost 100lbs  . Non Hodgkin's lymphoma 2000    tx with XRT at [facility], in remission  . Myocardial infarction  June 9,2009  . Osteoarthritis     right hip   Past Surgical History  Procedure Laterality Date  . Cardiac catheterization      2011  . Leg surgery      on bone above ankle-not sure which bone left leg 1981  . Multiple extractions with alveoloplasty  10/31/2011    Procedure: MULTIPLE EXTRACION WITH ALVEOLOPLASTY;  Surgeon: Lenn Cal, DDS;  Location: [facility];  Service: Oral Surgery;  Laterality: N/A;  Extraction of tooth #'s 2, 4,5,6,7,8,9,10,11,12,13,15,21,22,23,24,25,26,27,28, 29, and 31 with alveoloplasty  . Leg surgery  1981    Bone cyst- Left leg   Family History  Problem Relation Age of Onset  . Heart attack Mother     65's  . Heart disease Mother     Heart disease before age 22  . Hypertension Mother   . Hyperlipidemia Mother   . Peripheral Artery Disease Father   . Hypertension Father   . Hyperlipidemia Father   . Peripheral vascular disease Father   . Heart attack      40's/40's   History   Social History  . Marital Status: Married    Spouse Name: N/A    Number of Children: N/A  . Years of Education: N/A   Occupational History  .      Disabled. Former Biomedical scientist.   Social History Main Topics  . Smoking status: Former Smoker -- 0.50 packs/day for  43 years    Types: Cigarettes  . Smokeless tobacco: Never Used     Comment: QUIT ABOUT 66YRS AGO, heavy 2nd hand exposue ongoing  . Alcohol Use: No     Comment: former heavy user quit 2011  . Drug Use: No  . Sexual Activity: Not on file   Other Topics Concern  . Not on file   Social History Narrative   The patient is married. The patient has no children.   Patient with a history of smoking one pack per day for 43 years. Patient started smoking at the age of 48.   Patient no longer drinks alcohol. Patient quit proximally one and a half years ago from a previously heavy alcohol use.     Review of Systems  Constitutional: Negative for fever, chills, activity change and appetite change.  HENT: Negative for  congestion, postnasal drip, rhinorrhea, sinus pressure and sneezing.   Respiratory: Negative for cough, choking, chest tightness, shortness of breath and wheezing.   Cardiovascular: Negative for chest pain, palpitations and leg swelling.  Gastrointestinal: Negative for nausea, vomiting, diarrhea and constipation.  Endocrine: Negative for cold intolerance and heat intolerance.  Musculoskeletal: Negative for arthralgias and joint swelling.  Skin: Negative for rash and wound.  Neurological: Positive for dizziness (orthostatic) and numbness (chronic peripheral neuropathy). Negative for syncope, weakness and headaches.  Psychiatric/Behavioral: Negative for sleep disturbance. The patient is not nervous/anxious.   All other systems reviewed and are negative.      Objective:   Physical Exam  Vitals reviewed. Constitutional: He is oriented to person, place, and time. He appears well-developed and well-nourished. No distress.  HENT:  Head: Normocephalic and atraumatic.  Right Ear: External ear normal.  Left Ear: External ear normal.  Nose: Nose normal.  Mouth/Throat: Oropharynx is clear and moist. No oropharyngeal exudate.  Eyes: Conjunctivae and EOM are normal. Pupils are equal, round, and reactive to light. Right eye exhibits no discharge. Left eye exhibits no discharge. No scleral icterus.  Neck: Normal range of motion. Neck supple. No thyromegaly present.  Cardiovascular: Normal rate, regular rhythm and intact distal pulses.  Frequent extrasystoles are present. Exam reveals distant heart sounds.   No murmur heard. Pulses:      Dorsalis pedis pulses are 2+ on the right side, and 2+ on the left side.  Pulmonary/Chest: Effort normal and breath sounds normal. No respiratory distress. He has no wheezes. He exhibits no tenderness.  Abdominal: Soft. Bowel sounds are normal. He exhibits no distension and no mass. There is no tenderness. There is no guarding.  Musculoskeletal: Normal range of motion.  He exhibits no edema and no tenderness.  Lymphadenopathy:    He has no cervical adenopathy.  Neurological: He is alert and oriented to person, place, and time.  Skin: Skin is warm and dry. No rash noted. He is not diaphoretic. No erythema.  Psychiatric: He has a normal mood and affect. His behavior is normal. Judgment and thought content normal.    Wt Readings from Last 3 Encounters:  08/10/13 193 lb (87.544 kg)  03/25/13 196 lb (88.905 kg)  03/22/13 197 lb (89.359 kg)   Lab Results  Component Value Date   WBC 9.1 10/28/2011   HGB 16.9 10/28/2011   HCT 46.6 10/28/2011   PLT 211 10/28/2011   GLUCOSE 189* 03/22/2013   CHOL 231* 03/22/2013   TRIG 993.0 Triglyceride is over 400; calculations on Lipids are invalid.* 03/22/2013   HDL 27.80* 03/22/2013   LDLDIRECT 80.6 03/22/2013  [place]  Value: 111        Total Cholesterol/HDL:CHD Risk Coronary Heart Disease Risk Table                     Men   Women  1/2 Average Risk   3.4   3.3* 10/13/2007   ALT 18 03/22/2013   AST 16 03/22/2013   NA 133* 03/22/2013   K 4.3 03/22/2013   CL 101 03/22/2013   CREATININE 0.9 03/22/2013   BUN 11 03/22/2013   CO2 23 03/22/2013   TSH 1.10 10/16/2011   PSA 0.61 10/16/2011   INR 1.0 ratio 06/11/2009   HGBA1C 6.6* 03/22/2013   MICROALBUR 3.1* 11/29/2012   Orthostatic -     Lying - 138/82     Sitting - 122/64     Standing - 128/72  EKG - sinus rhythm with PVC's, rate 78, intervals .13/.1/.38     Assessment & Plan:   AWV - V70.0 -Today patient counseled on age appropriate routine health concerns for screening and prevention, each reviewed and up to date or declined. Immunizations reviewed and up to date or declined. Labs/ECG reviewed. Risk factors for depression reviewed and negative. Hearing function and visual acuity are intact. ADLs screened and addressed as needed. Functional ability and level of safety reviewed and appropriate. Education, counseling and referrals performed based on assessed risks today.  Patient provided with a copy of personalized plan for preventive services.  Bradycardia. Not verified on repeat ECG. PVCs noted. Do not feel symptoms related to arrhythmia. Followup with cardiology as planned, no medication changes recommended today  Problem List Items Addressed This Visit   DIAB W/O COMP TYPE II/UNS NOT STATED UNCNTRL      Edema with Actos (SE) and Januvia ineffective +$$ prohib On amaryl + metformin - will DC [facility] because of ?hypoglycemic symptoms with a1c<7 Plans to follow with me (PCP) rather than endo On ASA, statin and ACEI Check a1c q33mo, titrate as needed  Lab Results  Component Value Date   HGBA1C 6.6* 03/22/2013      Relevant Orders      Hemoglobin A1c      Basic metabolic panel    Other Visit Diagnoses   Routine general medical examination at a health care facility    -  Primary    Bradycardia        Relevant Orders       EKG 12-Lead (Completed)    Need for prophylactic vaccination against Streptococcus pneumoniae (pneumococcus)        Relevant Orders       Pneumococcal polysaccharide vaccine 23-valent greater than or equal to 2yo subcutaneous/IM (Completed)

## 2013-08-10 NOTE — Patient Instructions (Signed)
It was good to see you today.  We have reviewed your prior records including labs and tests today  Health Maintenance reviewed - pneumonia vaccine updated today - other recommended immunizations and age-appropriate screenings are up-to-date.  Medications reviewed and updated Stop glimepiride ( Amaryl) -but continue metformin as ongoing for diabetes No other medication changes recommended today  Test(s) ordered today. Your results will be released to [facility] (or called to you) after review, usually within 72hours after test completion. If any changes need to be made, you will be notified at that same time.   Complete your screening for colon cancer with [facility] (stool DNA testing) - this packet will be sent to your home by the testing company. Complete instructions are in the box -we will contact you with the results once available.  Please schedule followup in 6 months for diabetes mellitus and blood pressure check, call sooner if problems.  Diabetes and Standards of Medical Care  Diabetes is complicated. You may find that your diabetes team includes a dietitian, nurse, diabetes educator, eye doctor, and more. To help everyone know what is going on and to help you get the care you deserve, the following schedule of care was developed to help keep you on track. Below are the tests, exams, vaccines, medicines, education, and plans you will need. HbA1c test This test shows how well you have controlled your glucose over the past 2 3 months. It is used to see if your diabetes management plan needs to be adjusted.   It is performed at least 2 times a year if you are meeting treatment goals.  It is performed 4 times a year if therapy has changed or if you are not meeting treatment goals. Blood pressure test  This test is performed at every routine medical visit. The goal is less than 140/90 mmHg for most people, but 130/80 mmHg in some cases. Ask your health care provider about your  goal. Dental exam  Follow up with the dentist regularly. Eye exam  If you are diagnosed with type 1 diabetes as a child, get an exam upon reaching the age of 43 years or older and have had diabetes for 3 5 years. Yearly eye exams are recommended after that initial eye exam.  If you are diagnosed with type 1 diabetes as an adult, get an exam within 5 years of diagnosis and then yearly.  If you are diagnosed with type 2 diabetes, get an exam as soon as possible after the diagnosis and then yearly. Foot care exam  Visual foot exams are performed at every routine medical visit. The exams check for cuts, injuries, or other problems with the feet.  A comprehensive foot exam should be done yearly. This includes visual inspection as well as assessing foot pulses and testing for loss of sensation.  Check your feet nightly for cuts, injuries, or other problems with your feet. Tell your health care provider if anything is not healing. Kidney function test (urine microalbumin)  This test is performed once a year.  Type 1 diabetes: The first test is performed 5 years after diagnosis.  Type 2 diabetes: The first test is performed at the time of diagnosis.  A serum creatinine and estimated glomerular filtration rate (eGFR) test is done once a year to assess the level of chronic kidney disease (CKD), if present. Lipid profile (cholesterol, HDL, LDL, triglycerides)  Performed every 5 years for most people.  The goal for LDL is less than 100 mg/dL. If you  are at high risk, the goal is less than 70 mg/dL.  The goal for HDL is 40 mg/dL 50 mg/dL for men and 50 mg/dL 60 mg/dL for women. An HDL cholesterol of 60 mg/dL or higher gives some protection against heart disease.  The goal for triglycerides is less than 150 mg/dL. Influenza vaccine, pneumococcal vaccine, and hepatitis B vaccine  The influenza vaccine is recommended yearly.  The pneumococcal vaccine is generally given once in a lifetime.  However, there are some instances when another vaccination is recommended. Check with your health care provider.  The hepatitis B vaccine is also recommended for adults with diabetes. Diabetes self-management education  Education is recommended at diagnosis and ongoing as needed. Treatment plan  Your treatment plan is reviewed at every medical visit. Document Released: 02/16/2009 Document Revised: 12/22/2012 Document Reviewed: 09/21/2012 [facility] Patient Information 2014 [place].

## 2013-08-10 NOTE — Assessment & Plan Note (Signed)
Edema with Actos (SE) and Januvia ineffective +$$ prohib On amaryl + metformin - will DC [facility] because of ?hypoglycemic symptoms with a1c<7 Plans to follow with me (PCP) rather than endo On ASA, statin and ACEI Check a1c q24mo, titrate as needed  Lab Results  Component Value Date   HGBA1C 6.6* 03/22/2013

## 2013-08-10 NOTE — Progress Notes (Signed)
Pre visit review using our clinic review tool, if applicable. No additional management support is needed unless otherwise documented below in the visit note. 

## 2013-08-28 ENCOUNTER — Encounter: Payer: Self-pay | Admitting: Internal Medicine

## 2013-08-28 ENCOUNTER — Other Ambulatory Visit: Payer: Self-pay | Admitting: Cardiology

## 2013-08-29 ENCOUNTER — Other Ambulatory Visit: Payer: Self-pay

## 2013-08-29 ENCOUNTER — Other Ambulatory Visit: Payer: Self-pay | Admitting: *Deleted

## 2013-08-29 MED ORDER — ISOSORBIDE MONONITRATE ER 30 MG PO TB24: 30.0000 mg | ORAL_TABLET | ORAL | Status: DC | PRN

## 2013-08-29 MED ORDER — METOPROLOL TARTRATE 50 MG PO TABS: ORAL_TABLET | ORAL | Status: DC

## 2013-08-29 MED ORDER — LISINOPRIL 40 MG PO TABS: 40.0000 mg | ORAL_TABLET | Freq: Every day | ORAL | Status: DC

## 2013-08-29 MED ORDER — CILOSTAZOL 100 MG PO TABS: ORAL_TABLET | ORAL | Status: DC

## 2013-08-29 MED ORDER — PRAVASTATIN SODIUM 80 MG PO TABS: ORAL_TABLET | ORAL | Status: DC

## 2013-08-29 MED ORDER — METFORMIN HCL ER 500 MG PO TB24: 1000.0000 mg | ORAL_TABLET | Freq: Two times a day (BID) | ORAL | Status: DC

## 2013-08-29 NOTE — Telephone Encounter (Signed)
Sent email needing refill on his metformin...[name]

## 2013-09-01 ENCOUNTER — Other Ambulatory Visit: Payer: Self-pay

## 2013-09-01 MED ORDER — ISOSORBIDE MONONITRATE ER 30 MG PO TB24: 30.0000 mg | ORAL_TABLET | Freq: Every day | ORAL | Status: DC | PRN

## 2013-09-12 ENCOUNTER — Other Ambulatory Visit: Payer: Self-pay | Admitting: *Deleted

## 2013-09-19 ENCOUNTER — Ambulatory Visit (INDEPENDENT_AMBULATORY_CARE_PROVIDER_SITE_OTHER): Payer: Commercial Managed Care - HMO | Admitting: Cardiology

## 2013-09-19 ENCOUNTER — Encounter: Payer: Self-pay | Admitting: Cardiology

## 2013-09-19 VITALS — BP 128/80 | HR 76 | Ht 70.0 in | Wt 189.0 lb

## 2013-09-19 DIAGNOSIS — I739 Peripheral vascular disease, unspecified: Secondary | ICD-10-CM

## 2013-09-19 DIAGNOSIS — I70229 Atherosclerosis of native arteries of extremities with rest pain, unspecified extremity: Secondary | ICD-10-CM

## 2013-09-19 DIAGNOSIS — I10 Essential (primary) hypertension: Secondary | ICD-10-CM

## 2013-09-19 DIAGNOSIS — E119 Type 2 diabetes mellitus without complications: Secondary | ICD-10-CM

## 2013-09-19 DIAGNOSIS — I2510 Atherosclerotic heart disease of native coronary artery without angina pectoris: Secondary | ICD-10-CM

## 2013-09-19 DIAGNOSIS — I7389 Other specified peripheral vascular diseases: Secondary | ICD-10-CM

## 2013-09-19 LAB — BASIC METABOLIC PANEL
BUN: 11 mg/dL (ref 6–23)
CO2: 24 mEq/L (ref 19–32)
CREATININE: 1 mg/dL (ref 0.4–1.5)
Calcium: 9.6 mg/dL (ref 8.4–10.5)
Chloride: 101 mEq/L (ref 96–112)
GFR: 82.27 mL/min (ref >60.00)
GLUCOSE: 189 mg/dL — AB (ref 70–99)
POTASSIUM: 4.4 meq/L (ref 3.5–5.1)
Sodium: 135 mEq/L (ref 135–145)

## 2013-09-19 LAB — LIPID PANEL
Cholesterol: 205 mg/dL — ABNORMAL HIGH (ref 0–200)
HDL: 30.5 mg/dL — ABNORMAL LOW (ref >39.00)
LDL Cholesterol: 35 mg/dL (ref 0–99)
Total CHOL/HDL Ratio: 7
Triglycerides: 698 mg/dL — ABNORMAL HIGH (ref 0.0–149.0)
VLDL: 139.6 mg/dL — AB (ref 0.0–40.0)

## 2013-09-19 NOTE — Progress Notes (Signed)
Patient ID: [name], male   DOB: 11-27-1955, 58 y.o.   MRN: [id number] PCP: Asa Lente  58 yo with history of CAD, severe PAD, and diabetes presents for followup. He had NSTEMI in 6/09 that was treated medically as patient refused catheterization due to financial issues. He also has severe claudication. Patient had catheterization in 2/11 showing totally occluded circumflex artery with some collaterals as well as severe PAD (see PMH for description).  He has seen Dr. Trula Slade and plan for now is observation with the option of fem-pop bypass down the road.  Most recent ABIs were stable compared to the past.   Patient still has right leg/foot pain after walking 10-20 feet.  He does not have any ulcers on his feet.  If his legs hang down for a long time, his feet will start to hurt. These symptoms are stable.  He continues to stay away from cigarettes.  I had him do a Lexiscan Sestamibi in 4/14.  This showed a primarily fixed lateral defect consistent with prior lateral MI but no significant ischemia.  He continue to have minimal angina.  He has very rare pain in his jaw/neck.   He is not very active because of claudication.  Weight is down by about 8 lbs.  He has had very high triglycerides but had side effects from fenofibrate and Lovaza.   Labs (3/11): HDL 20 => 36, LDL 72, TGs 1803 => 448  Labs (2/11): K 4.4, creatinine 0.8  Labs (6/13): TGs 539, HDL 33, LDL 84, K 4.7, creatinine 1.2 Labs (4/14): K 4.2, creatinine 1.0, TGs 553, LDL 79, HDL 31 Labs (6/14): TGs 759 (not on Lovaza), HDL 28, LDL 78 Labs (11/14): LDL 81, HDL 28, TGs 993 Labs (4/15): K 4.7, creatinine 1.0  Allergies (verified):  No Known Drug Allergies   Past Medical History:  1. Non-Hodgkin lymphoma. This was treated about 10 years ago with radiation, it is now in remission. He had it in his axillary area and inguinal areas.  2. Formerly morbidly obese, but lost 100 pounds by diet and exercise.  3. Hypertension.  4.  Hypercholesterolemia.  Unable to tolerate fenofibrate due to joint pains. Myalgias with atorvastatin.  5. History of alcohol abuse.  6. Current smoker. He smokes a pack a day. ? COPD.  7. Coronary artery disease. The patient did have a non-ST-elevation MI in June 2009 that was treated medically. The patient refused LHC at that time. LHC done 2/11 showed totally occluded CFX with faint R->L collaterals, 40-50% proximal LAD, patent RCA. Renal arteries were patent.  Lexiscan Sestamibi (4/14) with EF 61%, primarily fixed medium-sized basal to mid inferolateral perfusion defect suggestive of prior MI with minimal ischemia.  8. Echocardiogram done in June 2009 showed an EF of 60%, mild LVH, no regional wall motion abnormalities.  9. PAD. He underwent ABIs and lower extremity duplex scanning on February 18, 2008. This demonstrated occlusion of the distal right superficial femoral artery with reconstitution at the level of the popliteal artery. There appeared to be some degree of inflow disease because of biphasic common femoral waveforms. The ABI was 0.49 on the right and 0.87 on the left. Peripheral angiogram (2/11) showed 50% L CIA stenosis, 90% stenosis of L SFA and popliteal, totally occluded R SFA with reconstitution of popliteal. Fem-pop bypass would be option => he has seen Dr. Trula Slade, would operate for pain at rest or tissue ischemia.  ABIs (2/14) with 0.66 on right, 0.69 on left.  ABIs (7/14)  0.67 on right, 0.79 on left.  10. Type II diabetes: Peripheral edema with Actos.   Family History:  The patient's mother had a myocardial infarction in her 54s. He had 2 aunts with myocardial infarctions in their 42s. His father had peripheral arterial disease. No diabetes in his immediate family.   Social History:  The patient lives with his wife. He is an unemployed Biomedical scientist, originally from [place]. He has no children. He quit smoking in 10/13. He used to drink heavily but has quit. He is on disability.    Review of Systems  All systems reviewed and negative except as per HPI.   Current Outpatient Prescriptions  Medication Sig Dispense Refill  . AMBULATORY NON FORMULARY MEDICATION Medication Name: EUCLID STUDY DRUG-Plavix vs Brilinta      . cilostazol (PLETAL) 100 MG tablet TAKE 1 TABLET TWICE DAILY  180 tablet  0  . Diphenhydramine-Acetaminophen (PERCOGESIC PO) Take 2 tablets by mouth 2 (two) times daily as needed. For dental pain      . glucose blood (TRUETEST TEST) test strip Use as instructed to check blood sugar twice daily dx 250.00  200 each  3  . isosorbide mononitrate (IMDUR) 30 MG 24 hr tablet Take 1 tablet (30 mg total) by mouth daily as needed.  90 tablet  0  . lisinopril (PRINIVIL,ZESTRIL) 40 MG tablet Take 1 tablet (40 mg total) by mouth daily.  90 tablet  0  . metFORMIN (GLUCOPHAGE-XR) 500 MG 24 hr tablet Take 2 tablets (1,000 mg total) by mouth 2 (two) times daily.  360 tablet  3  . metoprolol (LOPRESSOR) 50 MG tablet TAKE 1 TABLET TWICE DAILY  180 tablet  0  . niacin 500 MG tablet Take 1,000 mg by mouth daily.      . pravastatin (PRAVACHOL) 80 MG tablet TAKE 1 TABLET EVERY EVENING  90 tablet  0  . TRUEPLUS LANCETS 28G MISC 1 each by Does not apply route 2 (two) times daily. Dx 250.00  200 each  3   No current facility-administered medications for this visit.    BP 128/80  Pulse 76  Ht 5\' 10"  (1.778 m)  Wt 85.73 kg (189 lb)  BMI 27.12 kg/m2  SpO2 99% General: NAD Neck: No JVD, no thyromegaly or thyroid nodule.  Lungs: Distant breath sounds.  CV: Nondisplaced PMI.  Heart regular S1/S2, no S3/S4, 1/6 SEM.  No peripheral edema.  No carotid bruit.  Unable to palpate DP or PT pulses on either leg. Abdomen: Soft, nontender, no hepatosplenomegaly, no distention.   Neurologic: Alert and oriented x 3.  Psych: Normal affect. Extremities: No clubbing or cyanosis.   Assessment/Plan:  CAD, NATIVE VESSEL  Known CAD.  He is on a study drug, either Plavix or Brilinta. He will  continue ACEI, beta blocker, and statin.  Lexiscan Sestamibi in 4/14 was suggestive of prior LCx-territory MI (totally occluded LCx on last cath).  He has not had significant angina recently.  PVD WITH CLAUDICATION  Known severe PAD (see PMH). Symptoms stable, not at rest (unless his feet are dangling for a period of time) and no pedal ulcers. He has quit smoking. He is very limited by claudication.  He is on cilostazol and either Plavix or Brilinta (study drug).  Fem-pop bypass on right would be option if symptoms become intolerable. ABIs stable in 7/14.  He is to see Dr. Trula Slade again in about a month.  HYPERLIPIDEMIA-MIXED Mr Jepsen has had very high triglycerides.  Right now, he  is on pravastatin and niacin.  He was unable to tolerate fenofibrate or Lovaza.  His weight is down and diabetes is under better control (was able to stop one of his hypoglycemics) so hopefully, triglycerides will also be down.  Will check lipids today.  Hypertension BP is under good control.   Followup in  6 months.   Larey Dresser 09/19/2013

## 2013-09-19 NOTE — Patient Instructions (Signed)
Your physician recommends that you continue on your current medications as directed. Please refer to the Current Medication list given to you today.  Your physician recommends that you return for lab work in: [place] (BMET AND LIPIDS)  Your physician wants you to follow-up in: [place] will receive a reminder letter in the mail two months in advance. If you don't receive a letter, please call our office to schedule the follow-up appointment.

## 2013-09-23 ENCOUNTER — Other Ambulatory Visit: Payer: Self-pay

## 2013-09-23 MED ORDER — ISOSORBIDE MONONITRATE ER 30 MG PO TB24: 30.0000 mg | ORAL_TABLET | Freq: Every day | ORAL | Status: DC

## 2013-11-03 ENCOUNTER — Telehealth: Payer: Self-pay | Admitting: Family

## 2013-11-03 NOTE — Telephone Encounter (Signed)
Patient canceled his follow up with [facility] and [facility], due to the new billing of "outpatient services". He did not reschedule, but asked to speak with the office manager. FWD: Delsa Sale He can be reached at [phone].  dpm

## 2013-11-10 ENCOUNTER — Telehealth: Payer: Self-pay | Admitting: Cardiology

## 2013-11-10 MED ORDER — ISOSORBIDE MONONITRATE ER 30 MG PO TB24: 30.0000 mg | ORAL_TABLET | Freq: Two times a day (BID) | ORAL | Status: DC

## 2013-11-10 NOTE — Telephone Encounter (Signed)
I spoke with pt after reviewing chart ( all records for the past 1 1/2 years) & he is taking isosorbide mononitrate 30mg  bid. It may have been miscommunicated on an of 03/2013. I corrected this in his chart & sent in a new prescription. Pt is aware  Horton Chin RN

## 2013-11-10 NOTE — Telephone Encounter (Signed)
New message     Please call regarding the dosage of his isosorbide.  The new presc says 1 tablet daily and he has been taking 1 tablet twice a day.  Did something change?

## 2013-11-21 ENCOUNTER — Ambulatory Visit: Payer: Self-pay | Admitting: Family

## 2013-11-21 ENCOUNTER — Encounter (HOSPITAL_COMMUNITY): Payer: Self-pay

## 2013-12-08 ENCOUNTER — Other Ambulatory Visit: Payer: Self-pay | Admitting: Cardiology

## 2013-12-11 ENCOUNTER — Telehealth: Payer: Self-pay | Admitting: Cardiology

## 2013-12-11 DIAGNOSIS — I25812 Atherosclerosis of bypass graft of coronary artery of transplanted heart without angina pectoris: Secondary | ICD-10-CM

## 2013-12-12 MED ORDER — CILOSTAZOL 100 MG PO TABS: ORAL_TABLET | ORAL | Status: DC

## 2013-12-12 MED ORDER — METOPROLOL TARTRATE 50 MG PO TABS: ORAL_TABLET | ORAL | Status: DC

## 2013-12-12 MED ORDER — LISINOPRIL 40 MG PO TABS: 40.0000 mg | ORAL_TABLET | Freq: Every day | ORAL | Status: DC

## 2013-12-12 MED ORDER — PRAVASTATIN SODIUM 80 MG PO TABS: 80.0000 mg | ORAL_TABLET | Freq: Every day | ORAL | Status: DC

## 2013-12-13 ENCOUNTER — Other Ambulatory Visit: Payer: Self-pay

## 2013-12-13 DIAGNOSIS — I25812 Atherosclerosis of bypass graft of coronary artery of transplanted heart without angina pectoris: Secondary | ICD-10-CM

## 2013-12-13 MED ORDER — CILOSTAZOL 100 MG PO TABS: ORAL_TABLET | ORAL | Status: DC

## 2013-12-13 MED ORDER — PRAVASTATIN SODIUM 80 MG PO TABS: 80.0000 mg | ORAL_TABLET | Freq: Every day | ORAL | Status: DC

## 2013-12-13 MED ORDER — LISINOPRIL 40 MG PO TABS: 40.0000 mg | ORAL_TABLET | Freq: Every day | ORAL | Status: DC

## 2013-12-13 MED ORDER — METOPROLOL TARTRATE 50 MG PO TABS: ORAL_TABLET | ORAL | Status: DC

## 2014-01-21 ENCOUNTER — Other Ambulatory Visit: Payer: Self-pay | Admitting: Cardiology

## 2014-02-20 ENCOUNTER — Encounter: Payer: Self-pay | Admitting: Internal Medicine

## 2014-02-20 ENCOUNTER — Other Ambulatory Visit (INDEPENDENT_AMBULATORY_CARE_PROVIDER_SITE_OTHER): Payer: Commercial Managed Care - HMO

## 2014-02-20 ENCOUNTER — Ambulatory Visit (INDEPENDENT_AMBULATORY_CARE_PROVIDER_SITE_OTHER): Payer: Commercial Managed Care - HMO | Admitting: Internal Medicine

## 2014-02-20 VITALS — BP 132/80 | HR 76 | Temp 97.6°F | Ht 70.0 in | Wt 191.0 lb

## 2014-02-20 DIAGNOSIS — I10 Essential (primary) hypertension: Secondary | ICD-10-CM

## 2014-02-20 DIAGNOSIS — Z23 Encounter for immunization: Secondary | ICD-10-CM

## 2014-02-20 DIAGNOSIS — E1159 Type 2 diabetes mellitus with other circulatory complications: Secondary | ICD-10-CM

## 2014-02-20 DIAGNOSIS — E1151 Type 2 diabetes mellitus with diabetic peripheral angiopathy without gangrene: Secondary | ICD-10-CM

## 2014-02-20 LAB — LIPID PANEL
Cholesterol: 242 mg/dL — ABNORMAL HIGH (ref 0–200)
HDL: 24.1 mg/dL — AB (ref >39.00)
NONHDL: 217.9
Total CHOL/HDL Ratio: 10
VLDL: 269.4 mg/dL — ABNORMAL HIGH (ref 0.0–40.0)

## 2014-02-20 LAB — LDL CHOLESTEROL, DIRECT: LDL DIRECT: 63 mg/dL

## 2014-02-20 LAB — BASIC METABOLIC PANEL
BUN: 12 mg/dL (ref 6–23)
CALCIUM: 9.8 mg/dL (ref 8.4–10.5)
CO2: 25 mEq/L (ref 19–32)
CREATININE: 0.9 mg/dL (ref 0.4–1.5)
Chloride: 101 mEq/L (ref 96–112)
GFR: 87.22 mL/min (ref >60.00)
Glucose, Bld: 209 mg/dL — ABNORMAL HIGH (ref 70–99)
Potassium: 4.9 mEq/L (ref 3.5–5.1)
Sodium: 137 mEq/L (ref 135–145)

## 2014-02-20 LAB — HEMOGLOBIN A1C: Hgb A1c MFr Bld: 8.2 % — ABNORMAL HIGH (ref 4.6–6.5)

## 2014-02-20 MED ORDER — METFORMIN HCL ER 500 MG PO TB24: 1000.0000 mg | ORAL_TABLET | Freq: Two times a day (BID) | ORAL | Status: DC

## 2014-02-20 MED ORDER — ISOSORBIDE MONONITRATE ER 30 MG PO TB24: 30.0000 mg | ORAL_TABLET | Freq: Two times a day (BID) | ORAL | Status: DC

## 2014-02-20 MED ORDER — PRAVASTATIN SODIUM 80 MG PO TABS: 80.0000 mg | ORAL_TABLET | Freq: Every day | ORAL | Status: DC

## 2014-02-20 MED ORDER — LISINOPRIL 40 MG PO TABS: 40.0000 mg | ORAL_TABLET | Freq: Every day | ORAL | Status: DC

## 2014-02-20 MED ORDER — CILOSTAZOL 100 MG PO TABS: 100.0000 mg | ORAL_TABLET | Freq: Two times a day (BID) | ORAL | Status: DC

## 2014-02-20 MED ORDER — METOPROLOL TARTRATE 50 MG PO TABS: 50.0000 mg | ORAL_TABLET | Freq: Two times a day (BID) | ORAL | Status: DC

## 2014-02-20 MED ORDER — NIACIN 500 MG PO TABS: 1000.0000 mg | ORAL_TABLET | Freq: Every day | ORAL | Status: DC

## 2014-02-20 NOTE — Progress Notes (Signed)
Subjective:    Patient ID: [name], male    DOB: 28-Mar-1956, 58 y.o.   MRN: [id number]  HPI  Patient is here today for follow up.  Chronic medical issues reviewed - his wife has been diagnosed with Alzheimer's which is causing increased stress  DM2 - on metformin; Amaryl discontinued April 2015 due to controlled A1C - not currently checking CBG's at home, no hypoglycemic events - prior actos caused side effects and felt Januvia "did nothing" and was $ prohibitive. pt reports compliance with current therapy.   PAD - BLE claudication pain but no rest pain - works with cards on same - s/p vasc eval 12/2011 ([place]) - recommended against fem pop at this time or other revascularization.   CAD - NSTEMI 6/09 treated medically; cath 2/11 with totally occluded circumflex with some collaterals. 4/14 myoview with fixed lateral defect but no ischemia. Follows with cardiology Aundra Dubin).  Chronic stable angina treated with Imdur.   hypertension - pt reports compliance with current medications. Reports occasional orthostatic intolerance. No shortness of breath. Has dry nonproductive cough - on Lisinopril.   dyslipidemia - on statin - tricor stopped due to MSkel pain - the patient reports compliance with medication(s) as prescribed. Denies adverse side effects. Following heart healthy diet.  Unable to exercise to due leg pain.   R hip/leg pain, chronic - improved lateral pain since trochanteric bursa injection 11/12 - still with intermittent leg pain felt related to djd and pvd, improved with cortisone into groin at [facility] 2013 ([place]) -   Past Medical History  Diagnosis Date  . Atherosclerosis of native arteries of the extremities with rest pain   . Coronary atherosclerosis of native coronary artery   . PAD (peripheral artery disease)   . DM type 2 (diabetes mellitus, type 2)   . Other and unspecified hyperlipidemia   . Unspecified essential hypertension   . Tobacco use disorder   . H/O  alcohol abuse     quit 2011 - dry since that time  . H/O: obesity     lost 100lbs  . Non Hodgkin's lymphoma 2000    tx with XRT at [facility], in remission  . Myocardial infarction June 9,2009  . Osteoarthritis     right hip     Review of Systems  Constitutional: Negative for fever, chills, activity change and appetite change.  Respiratory: Positive for cough (dry non-productive). Negative for apnea, chest tightness, shortness of breath and wheezing.   Cardiovascular: Negative for chest pain, palpitations and leg swelling.       Occasional jaw pain with exertion - improved  Gastrointestinal: Negative for nausea, vomiting, abdominal pain, diarrhea, constipation and abdominal distention.  Endocrine: Negative for polydipsia, polyphagia and polyuria.  Skin: Negative for rash.       Splinter in pad of right thumb with scab covering  Neurological: Positive for dizziness (chronic orthostatic). Negative for syncope, weakness and headaches.       Objective:   Physical Exam  Constitutional: He is oriented to person, place, and time. He appears well-developed and well-nourished. No distress.  HENT:  Head: Normocephalic and atraumatic.  Eyes: Conjunctivae and EOM are normal. Pupils are equal, round, and reactive to light.  Neck: Normal range of motion. Neck supple. No thyromegaly present.  Cardiovascular: Normal rate.   Occasional extrasystoles are present.  No murmur heard. Pulmonary/Chest: Effort normal and breath sounds normal. No respiratory distress. He has no wheezes.  Abdominal: Soft. Bowel sounds are normal. He  exhibits no distension. There is no tenderness.  Musculoskeletal: Normal range of motion. He exhibits no edema and no tenderness.  Lymphadenopathy:    He has no cervical adenopathy.  Neurological: He is alert and oriented to person, place, and time.  Skin: Skin is warm and dry. He is not diaphoretic.  Scab over pad of right thumb; no redness, swelling, heat, drainage    Psychiatric: He has a normal mood and affect. His behavior is normal. Judgment and thought content normal.    Filed Vitals:   02/20/14 1020  BP: 132/80  Pulse: 76  Temp: 97.6 F (36.4 C)    Wt Readings from Last 3 Encounters:  02/20/14 191 lb (86.637 kg)  09/19/13 189 lb (85.73 kg)  08/10/13 193 lb (87.544 kg)    Lab Results  Component Value Date   WBC 9.1 10/28/2011   HGB 16.9 10/28/2011   HCT 46.6 10/28/2011   PLT 211 10/28/2011   GLUCOSE 189* 09/19/2013   CHOL 205* 09/19/2013   TRIG 698.0* 09/19/2013   HDL 30.50* 09/19/2013   LDLDIRECT 80.6 03/22/2013   LDLCALC 35 09/19/2013   ALT 18 03/22/2013   AST 16 03/22/2013   NA 135 09/19/2013   K 4.4 09/19/2013   CL 101 09/19/2013   CREATININE 1.0 09/19/2013   BUN 11 09/19/2013   CO2 24 09/19/2013   TSH 1.10 10/16/2011   PSA 0.61 10/16/2011   INR 1.0 ratio 06/11/2009   HGBA1C 6.7* 08/10/2013   MICROALBUR 3.1* 11/29/2012       Assessment & Plan:   Problem List Items Addressed This Visit   Essential hypertension      BP Readings from Last 3 Encounters:  02/20/14 132/80  09/19/13 128/80  08/10/13 122/72   The current medical regimen is generally effective;  continue present plan and medications.    Relevant Medications      isosorbide mononitrate (IMDUR) 24 hr tablet      lisinopril (PRINIVIL,ZESTRIL) tablet      metoprolol (LOPRESSOR) tablet      pravastatin (PRAVACHOL) tablet   Type 2 diabetes mellitus with vascular disease - Primary      Edema with Actos (SE) and Januvia ineffective +$$ prohib prev amaryl + metformin Stopped [facility] 08/2013 because of ?hypoglycemic symptoms with a1c<7 Plans to follow with me (PCP) rather than endo On ASA, statin and ACEI Check a1c q39mo, titrate as needed  Lab Results  Component Value Date   HGBA1C 6.7* 08/10/2013      Relevant Medications      isosorbide mononitrate (IMDUR) 24 hr tablet      lisinopril (PRINIVIL,ZESTRIL) tablet      metFORMIN (GLUCOPHAGE-XR) 24 hr tablet      metoprolol  (LOPRESSOR) tablet      pravastatin (PRAVACHOL) tablet   Other Relevant Orders      Hemoglobin A1c      Basic metabolic panel      Ambulatory referral to Podiatry      Lipid panel    Other Visit Diagnoses   Need for prophylactic vaccination and inoculation against influenza        Relevant Orders       Flu Vaccine QUAD 36+ mos PF IM (Fluarix Quad PF)    Need for prophylactic vaccination against Streptococcus pneumoniae (pneumococcus)

## 2014-02-20 NOTE — Patient Instructions (Signed)
It was good to see you today.  We have reviewed your prior records including labs and tests today  Your annual flu shot and one time Prevnar was given and/or updated today.  Test(s) ordered today. Your results will be released to [place] (or called to you) after review, usually within 72hours after test completion. If any changes need to be made, you will be notified at that same time.  Medications reviewed and updated, no changes recommended at this time. Refill on medication(s) as discussed today.  Continue working with your specialists as reviewed today  we'll make referral to podiatry for foot check as requested . Our office will contact you regarding appointment(s) once made.  Please schedule followup in 6 months, call sooner if problems.

## 2014-02-20 NOTE — Progress Notes (Signed)
Pre visit review using our clinic review tool, if applicable. No additional management support is needed unless otherwise documented below in the visit note. 

## 2014-02-20 NOTE — Assessment & Plan Note (Signed)
Edema with Actos (SE) and Januvia ineffective +$$ prohib prev amaryl + metformin Stopped [facility] 08/2013 because of ?hypoglycemic symptoms with a1c<7 Plans to follow with me (PCP) rather than endo On ASA, statin and ACEI Check a1c q62mo, titrate as needed  Lab Results  Component Value Date   HGBA1C 6.7* 08/10/2013

## 2014-02-20 NOTE — Assessment & Plan Note (Signed)
BP Readings from Last 3 Encounters:  02/20/14 132/80  09/19/13 128/80  08/10/13 122/72   The current medical regimen is generally effective;  continue present plan and medications.

## 2014-02-21 ENCOUNTER — Other Ambulatory Visit: Payer: Self-pay | Admitting: Internal Medicine

## 2014-02-21 MED ORDER — GLIMEPIRIDE 1 MG PO TABS: 1.0000 mg | ORAL_TABLET | Freq: Every day | ORAL | Status: DC

## 2014-02-23 ENCOUNTER — Ambulatory Visit (INDEPENDENT_AMBULATORY_CARE_PROVIDER_SITE_OTHER): Payer: Commercial Managed Care - HMO | Admitting: Podiatry

## 2014-02-23 ENCOUNTER — Encounter: Payer: Self-pay | Admitting: Podiatry

## 2014-02-23 VITALS — BP 134/82 | HR 88 | Resp 16

## 2014-02-23 DIAGNOSIS — B351 Tinea unguium: Secondary | ICD-10-CM

## 2014-02-23 DIAGNOSIS — M79673 Pain in unspecified foot: Secondary | ICD-10-CM

## 2014-02-23 NOTE — Progress Notes (Signed)
   Subjective:    Patient ID: [name], male    DOB: 18-Feb-1956, 59 y.o.   MRN: [id number]  HPI Comments: "Check my feet"  Patient states that he needs his feet checked due to diabetes. He needs his toenails cut too. His wife has been cutting them. His last A1C was 8.6.  Diabetes Associated symptoms include chest pain.      Review of Systems  Constitutional: Positive for activity change.  Cardiovascular: Positive for chest pain.       Calf pain with walking   Musculoskeletal: Positive for arthralgias and gait problem.  Hematological:       Slow to heal  All other systems reviewed and are negative.      Objective:   Physical Exam        Assessment & Plan:

## 2014-02-23 NOTE — Progress Notes (Signed)
Subjective:     Patient ID: [name], male   DOB: 01/14/1956, 58 y.o.   MRN: [id number]  Diabetes   patient presents with terrible nail disease 1-5 both feet with long-term diabetes and severe vascular disease that he is seeing a vascular surgeon for   Review of Systems  All other systems reviewed and are negative.      Objective:   Physical Exam  Nursing note and vitals reviewed. Constitutional: He is oriented to person, place, and time.  Musculoskeletal: Normal range of motion.  Neurological: He is oriented to person, place, and time.  Skin: Skin is warm and dry.   vascular status found to be significantly diminished on both feet with diminishment of neurological sharp dull and vibratory. Patient is noted to have severe nail disease with thickness yellow brittle-like debris 1-5 both feet with pain when palpated     Assessment:     At risk diabetic with painful mycotic nail infection    Plan:     H&P and condition and diabetic foot exam was done. At this time I talked to him about diabetic care and I debrided nailbeds 1-5 both feet were to be done every 3 months and will be seen earlier if any brace and skin were to occur      

## 2014-02-23 NOTE — Patient Instructions (Signed)
Diabetes and Foot Care Diabetes may cause you to have problems because of poor blood supply (circulation) to your feet and legs. This may cause the skin on your feet to become thinner, break easier, and heal more slowly. Your skin may become dry, and the skin may peel and crack. You may also have nerve damage in your legs and feet causing decreased feeling in them. You may not notice minor injuries to your feet that could lead to infections or more serious problems. Taking care of your feet is one of the most important things you can do for yourself.  HOME CARE INSTRUCTIONS  Wear shoes at all times, even in the house. Do not go barefoot. Bare feet are easily injured.  Check your feet daily for blisters, cuts, and redness. If you cannot see the bottom of your feet, use a mirror or ask someone for help.  Wash your feet with warm water (do not use hot water) and mild soap. Then pat your feet and the areas between your toes until they are completely dry. Do not soak your feet as this can dry your skin.  Apply a moisturizing lotion or petroleum jelly (that does not contain alcohol and is unscented) to the skin on your feet and to dry, brittle toenails. Do not apply lotion between your toes.  Trim your toenails straight across. Do not dig under them or around the cuticle. File the edges of your nails with an emery board or nail file.  Do not cut corns or calluses or try to remove them with medicine.  Wear clean socks or stockings every day. Make sure they are not too tight. Do not wear knee-high stockings since they may decrease blood flow to your legs.  Wear shoes that fit properly and have enough cushioning. To break in new shoes, wear them for just a few hours a day. This prevents you from injuring your feet. Always look in your shoes before you put them on to be sure there are no objects inside.  Do not cross your legs. This may decrease the blood flow to your feet.  If you find a minor scrape,  cut, or break in the skin on your feet, keep it and the skin around it clean and dry. These areas may be cleansed with mild soap and water. Do not cleanse the area with peroxide, alcohol, or iodine.  When you remove an adhesive bandage, be sure not to damage the skin around it.  If you have a wound, look at it several times a day to make sure it is healing.  Do not use heating pads or hot water bottles. They may burn your skin. If you have lost feeling in your feet or legs, you may not know it is happening until it is too late.  Make sure your health care provider performs a complete foot exam at least annually or more often if you have foot problems. Report any cuts, sores, or bruises to your health care provider immediately. SEEK MEDICAL CARE IF:   You have an injury that is not healing.  You have cuts or breaks in the skin.  You have an ingrown nail.  You notice redness on your legs or feet.  You feel burning or tingling in your legs or feet.  You have pain or cramps in your legs and feet.  Your legs or feet are numb.  Your feet always feel cold. SEEK IMMEDIATE MEDICAL CARE IF:   There is increasing redness,   swelling, or pain in or around a wound.  There is a red line that goes up your leg.  Pus is coming from a wound.  You develop a fever or as directed by your health care provider.  You notice a bad smell coming from an ulcer or wound. Document Released: 04/18/2000 Document Revised: 12/22/2012 Document Reviewed: 09/28/2012 ExitCare Patient Information 2015 [employer]. This information is not intended to replace advice given to you by your health care provider. Make sure you discuss any questions you have with your health care provider.  

## 2014-03-07 LAB — HM DIABETES EYE EXAM

## 2014-03-24 ENCOUNTER — Ambulatory Visit (INDEPENDENT_AMBULATORY_CARE_PROVIDER_SITE_OTHER): Payer: Commercial Managed Care - HMO | Admitting: Cardiology

## 2014-03-24 ENCOUNTER — Encounter: Payer: Self-pay | Admitting: Internal Medicine

## 2014-03-24 ENCOUNTER — Telehealth: Payer: Self-pay | Admitting: Cardiology

## 2014-03-24 ENCOUNTER — Encounter: Payer: Self-pay | Admitting: *Deleted

## 2014-03-24 VITALS — BP 120/80 | HR 87 | Ht 70.0 in | Wt 190.0 lb

## 2014-03-24 DIAGNOSIS — I739 Peripheral vascular disease, unspecified: Secondary | ICD-10-CM

## 2014-03-24 DIAGNOSIS — E781 Pure hyperglyceridemia: Secondary | ICD-10-CM

## 2014-03-24 DIAGNOSIS — I2510 Atherosclerotic heart disease of native coronary artery without angina pectoris: Secondary | ICD-10-CM

## 2014-03-24 DIAGNOSIS — R0989 Other specified symptoms and signs involving the circulatory and respiratory systems: Secondary | ICD-10-CM

## 2014-03-24 DIAGNOSIS — I10 Essential (primary) hypertension: Secondary | ICD-10-CM

## 2014-03-24 MED ORDER — NIACIN ER (ANTIHYPERLIPIDEMIC) 1000 MG PO TBCR: EXTENDED_RELEASE_TABLET | ORAL | Status: DC

## 2014-03-24 NOTE — Telephone Encounter (Signed)
Per check out today patient,  refused the cartiod, doppler due to cost and he would not be able to pay.

## 2014-03-24 NOTE — Patient Instructions (Signed)
Increase niaspan to 2000mg  at bedtime.This will be 2 of your 1000mg  tablets at the same time at bedtime.   Your physician has requested that you have a carotid duplex. This test is an ultrasound of the carotid arteries in your neck. It looks at blood flow through these arteries that supply the brain with blood. Allow one hour for this exam. There are no restrictions or special instructions.  Your physician wants you to follow-up in: 1 year with Dr Aundra Dubin. (November 2016). You will receive a reminder letter in the mail two months in advance. If you don't receive a letter, please call our office to schedule the follow-up appointment.

## 2014-03-26 ENCOUNTER — Encounter: Payer: Self-pay | Admitting: Cardiology

## 2014-03-26 DIAGNOSIS — I739 Peripheral vascular disease, unspecified: Secondary | ICD-10-CM

## 2014-03-26 DIAGNOSIS — E781 Pure hyperglyceridemia: Secondary | ICD-10-CM

## 2014-03-26 HISTORY — DX: Peripheral vascular disease, unspecified: I73.9

## 2014-03-26 HISTORY — DX: Pure hyperglyceridemia: E78.1

## 2014-03-26 NOTE — Progress Notes (Signed)
Patient ID: [name], male   DOB: June 01, 1955, 58 y.o.   MRN: [id number] PCP: Asa Lente  58 yo with history of CAD, severe PAD, and diabetes presents for followup. He had NSTEMI in 6/09 that was treated medically as patient refused catheterization due to financial issues. He also has severe claudication. Patient had catheterization in 2/11 showing totally occluded circumflex artery with some collaterals as well as severe PAD (see PMH for description).  He has seen Dr. Trula Slade and plan for now is observation with the option of fem-pop bypass down the road.  Most recent ABIs were stable compared to the past.   Patient still has right leg/foot pain after walking 10-20 feet.  He does not have any ulcers on his feet. These symptoms are stable.  He continues to stay away from cigarettes.  I had him do a Lexiscan Sestamibi in 4/14.  This showed a primarily fixed lateral defect consistent with prior lateral MI but no significant ischemia.  He has minimal angina now: very rare pain in his jaw/neck.   He is not very active because of claudication.  Weight is stable.  He has had very high triglycerides but had side effects from fenofibrate and Lovaza. No exertional dyspnea.   Labs (3/11): HDL 20 => 36, LDL 72, TGs 1803 => 448  Labs (2/11): K 4.4, creatinine 0.8  Labs (6/13): TGs 539, HDL 33, LDL 84, K 4.7, creatinine 1.2 Labs (4/14): K 4.2, creatinine 1.0, TGs 553, LDL 79, HDL 31 Labs (6/14): TGs 759 (not on Lovaza), HDL 28, LDL 78 Labs (11/14): LDL 81, HDL 28, TGs 993 Labs (4/15): K 4.7, creatinine 1.0 Labs (10/15): K 4.9, creatinine 0.9, triglycerides 1347, LDL 63  ECG: NSR, PVCs, lateral T wave flattening  Allergies (verified):  No Known Drug Allergies   Past Medical History:  1. Non-Hodgkin lymphoma. This was treated about 10 years ago with radiation, it is now in remission. He had it in his axillary area and inguinal areas.  2. Formerly morbidly obese, but lost 100 pounds by diet and exercise.   3. Hypertension.  4. Hypercholesterolemia with prominent hypertriglyceridemia.  Unable to tolerate fenofibrate due to joint pains. Myalgias with atorvastatin.  5. History of alcohol abuse.  6. Current smoker. He smokes a pack a day. ? COPD.  7. Coronary artery disease. The patient did have a non-ST-elevation MI in June 2009 that was treated medically. The patient refused LHC at that time. LHC done 2/11 showed totally occluded CFX with faint R->L collaterals, 40-50% proximal LAD, patent RCA. Renal arteries were patent.  Lexiscan Sestamibi (4/14) with EF 61%, primarily fixed medium-sized basal to mid inferolateral perfusion defect suggestive of prior MI with minimal ischemia.  8. Echocardiogram done in June 2009 showed an EF of 60%, mild LVH, no regional wall motion abnormalities.  9. PAD. He underwent ABIs and lower extremity duplex scanning on February 18, 2008. This demonstrated occlusion of the distal right superficial femoral artery with reconstitution at the level of the popliteal artery. There appeared to be some degree of inflow disease because of biphasic common femoral waveforms. The ABI was 0.49 on the right and 0.87 on the left. Peripheral angiogram (2/11) showed 50% L CIA stenosis, 90% stenosis of L SFA and popliteal, totally occluded R SFA with reconstitution of popliteal. Fem-pop bypass would be option => he has seen Dr. Trula Slade, would operate for pain at rest or tissue ischemia.  ABIs (2/14) with 0.66 on right, 0.69 on left.  ABIs (7/14)  0.67 on right, 0.79 on left.  10. Type II diabetes: Peripheral edema with Actos.   Family History:  The patient's mother had a myocardial infarction in her 19s. He had 2 aunts with myocardial infarctions in their 61s. His father had peripheral arterial disease. No diabetes in his immediate family.   Social History:  The patient lives with his wife. He is an unemployed Biomedical scientist, originally from [place]. He has no children. He quit smoking in 10/13. He  used to drink heavily but has quit. He is on disability.   Review of Systems  All systems reviewed and negative except as per HPI.   Current Outpatient Prescriptions  Medication Sig Dispense Refill  . AMBULATORY NON FORMULARY MEDICATION Medication Name: EUCLID STUDY DRUG-Plavix vs Brilinta    . cilostazol (PLETAL) 100 MG tablet Take 1 tablet (100 mg total) by mouth 2 (two) times daily. 180 tablet 3  . Diphenhydramine-Acetaminophen (PERCOGESIC PO) Take 2 tablets by mouth 2 (two) times daily as needed. For dental pain    . glimepiride (AMARYL) 1 MG tablet Take 1 tablet (1 mg total) by mouth daily with breakfast. 90 tablet 1  . glucose blood (TRUETEST TEST) test strip Use as instructed to check blood sugar twice daily dx 250.00 200 each 3  . isosorbide mononitrate (IMDUR) 30 MG 24 hr tablet Take 1 tablet (30 mg total) by mouth 2 (two) times daily. 180 tablet 3  . lisinopril (PRINIVIL,ZESTRIL) 40 MG tablet Take 1 tablet (40 mg total) by mouth daily. 90 tablet 0  . metFORMIN (GLUCOPHAGE-XR) 500 MG 24 hr tablet Take 2 tablets (1,000 mg total) by mouth 2 (two) times daily. 360 tablet 3  . metoprolol (LOPRESSOR) 50 MG tablet Take 1 tablet (50 mg total) by mouth 2 (two) times daily. 180 tablet 3  . niacin 500 MG tablet Take 2 tablets (1,000 mg total) by mouth daily. 180 tablet 3  . pravastatin (PRAVACHOL) 80 MG tablet Take 1 tablet (80 mg total) by mouth daily. 90 tablet 3  . TRUEPLUS LANCETS 28G MISC 1 each by Does not apply route 2 (two) times daily. Dx 250.00 200 each 3  . niacin (NIASPAN) 1000 MG CR tablet 2 tablets (2000mg ) at bedtime 180 tablet 3   No current facility-administered medications for this visit.    BP 120/80 mmHg  Pulse 87  Ht 5\' 10"  (1.778 m)  Wt 190 lb (86.183 kg)  BMI 27.26 kg/m2 General: NAD Neck: No JVD, no thyromegaly or thyroid nodule.  Lungs: Distant breath sounds.  CV: Nondisplaced PMI.  Heart regular S1/S2, no S3/S4, 2/6 early SEM.  No peripheral edema.  Left  carotid bruit.  Unable to palpate DP or PT pulses on either leg. Abdomen: Soft, nontender, no hepatosplenomegaly, no distention.   Neurologic: Alert and oriented x 3.  Psych: Normal affect. Extremities: No clubbing or cyanosis.   Assessment/Plan:  CAD, NATIVE VESSEL  Known CAD.  He is on a study drug, either Plavix or Brilinta. He will continue ACEI, beta blocker, and statin.  Lexiscan Sestamibi in 4/14 was suggestive of prior LCx-territory MI (totally occluded LCx on last cath).  He has not had significant angina recently.  PVD WITH CLAUDICATION  Known severe PAD (see PMH). Symptoms stable, not at rest (unless his feet are dangling for a period of time) and no pedal ulcers. He has quit smoking. He is very limited by claudication.  He is on cilostazol and either Plavix or Brilinta (study drug).  Fem-pop bypass on  right would be option if symptoms become intolerable. ABIs stable in 7/14.  He says that he is going to get ABIs again in 2/16 and will see Dr. Trula Slade after that.   HYPERLIPIDEMIA-MIXED Mr Raczkowski has had very high triglycerides.  Right now, he is on pravastatin and niacin.  He was unable to tolerate fenofibrate or Lovaza.  I will have him increase Niacin to 2000 mg daily. Recheck LFTs in 2 months.  Hypertension BP is under good control.  Carotid bruit Left carotid bruit, check carotid dopplers.    May followup in 1 year.    Loralie Champagne 03/26/2014

## 2014-03-27 ENCOUNTER — Telehealth: Payer: Self-pay | Admitting: Internal Medicine

## 2014-03-27 NOTE — Telephone Encounter (Signed)
emmi emailed °

## 2014-04-06 ENCOUNTER — Encounter: Payer: Self-pay | Admitting: Internal Medicine

## 2014-05-29 ENCOUNTER — Ambulatory Visit (INDEPENDENT_AMBULATORY_CARE_PROVIDER_SITE_OTHER): Payer: Commercial Managed Care - HMO | Admitting: Podiatry

## 2014-05-29 ENCOUNTER — Other Ambulatory Visit: Payer: Commercial Managed Care - HMO

## 2014-05-29 DIAGNOSIS — M79673 Pain in unspecified foot: Secondary | ICD-10-CM | POA: Diagnosis not present

## 2014-05-29 DIAGNOSIS — M722 Plantar fascial fibromatosis: Secondary | ICD-10-CM | POA: Diagnosis not present

## 2014-05-29 DIAGNOSIS — B351 Tinea unguium: Secondary | ICD-10-CM

## 2014-05-29 MED ORDER — TRIAMCINOLONE ACETONIDE 10 MG/ML IJ SUSP
10.0000 mg | Freq: Once | INTRAMUSCULAR | Status: AC
Start: 2014-05-29 — End: 2014-05-29
  Administered 2014-05-29: 10 mg

## 2014-05-29 NOTE — Progress Notes (Signed)
Subjective:     Patient ID: [name], male   DOB: March 02, 1956, 59 y.o.   MRN: [id number]  Diabetes   patient presents with terrible nail disease 1-5 both feet with long-term diabetes and severe vascular disease that he is seeing a vascular surgeon for   Review of Systems  All other systems reviewed and are negative.      Objective:   Physical Exam  Nursing note and vitals reviewed. Constitutional: He is oriented to person, place, and time.  Musculoskeletal: Normal range of motion.  Neurological: He is oriented to person, place, and time.  Skin: Skin is warm and dry.   vascular status found to be significantly diminished on both feet with diminishment of neurological sharp dull and vibratory. Patient is noted to have severe nail disease with thickness yellow brittle-like debris 1-5 both feet with pain when palpated     Assessment:     At risk diabetic with painful mycotic nail infection    Plan:     H&P and condition and diabetic foot exam was done. At this time I talked to him about diabetic care and I debrided nailbeds 1-5 both feet were to be done every 3 months and will be seen earlier if any brace and skin were to occur

## 2014-05-31 NOTE — Progress Notes (Signed)
Subjective:     Patient ID: [name], male   DOB: [birth year], 59 y.o.   MRN: [id number]  HPI patient's found to have continued thick painful nailbeds 1-5 both feet and at risk diabetic with long-term diabetes   Review of Systems     Objective:   Physical Exam Neurovascular status remains unchanged with thick yellow brittle nailbeds 1-5 both feet the become ingrown in the corners and are painful and impossible for him to cut    Assessment:     I chronic nail infections with pain 1-5 both feet    Plan:     Debride painful nailbeds 1-5 both feet with no iatrogenic bleeding noted

## 2014-06-14 ENCOUNTER — Ambulatory Visit (INDEPENDENT_AMBULATORY_CARE_PROVIDER_SITE_OTHER): Payer: Commercial Managed Care - HMO | Admitting: Podiatry

## 2014-06-14 ENCOUNTER — Encounter: Payer: Self-pay | Admitting: Podiatry

## 2014-06-14 VITALS — BP 152/82 | HR 96 | Resp 16

## 2014-06-14 DIAGNOSIS — M722 Plantar fascial fibromatosis: Secondary | ICD-10-CM

## 2014-06-14 MED ORDER — TRIAMCINOLONE ACETONIDE 10 MG/ML IJ SUSP
10.0000 mg | Freq: Once | INTRAMUSCULAR | Status: AC
  Administered 2014-06-14: 10 mg

## 2014-06-14 NOTE — Progress Notes (Signed)
Subjective:     Patient ID: [name], male   DOB: Aug 07, 1955, 59 y.o.   MRN: [id number]  HPI patient states that my left heel has started to hurt again and I did traumatize it in the last few weeks   Review of Systems     Objective:   Physical Exam Neurovascular status unchanged with discomfort in the center and center lateral aspect of the left heel with inflammation and fluid buildup noted    Assessment:     Plantar fasciitis left center lateral due to injury    Plan:     Injected from the lateral side 3 mg Kenalog 5 mg Xylocaine and advised on ice therapy and reduced activity

## 2014-06-20 LAB — HM DIABETES FOOT EXAM

## 2014-06-29 ENCOUNTER — Other Ambulatory Visit: Payer: Self-pay | Admitting: Internal Medicine

## 2014-06-30 MED ORDER — LISINOPRIL 40 MG PO TABS: 40.0000 mg | ORAL_TABLET | Freq: Every day | ORAL | Status: DC

## 2014-07-06 ENCOUNTER — Other Ambulatory Visit: Payer: Self-pay | Admitting: Internal Medicine

## 2014-08-13 ENCOUNTER — Encounter: Payer: Self-pay | Admitting: Internal Medicine

## 2014-08-13 DIAGNOSIS — IMO0002 Reserved for concepts with insufficient information to code with codable children: Secondary | ICD-10-CM

## 2014-08-13 DIAGNOSIS — E1165 Type 2 diabetes mellitus with hyperglycemia: Secondary | ICD-10-CM

## 2014-08-17 ENCOUNTER — Other Ambulatory Visit (INDEPENDENT_AMBULATORY_CARE_PROVIDER_SITE_OTHER): Payer: Commercial Managed Care - HMO

## 2014-08-17 ENCOUNTER — Telehealth: Payer: Self-pay

## 2014-08-17 ENCOUNTER — Encounter: Payer: Self-pay | Admitting: Internal Medicine

## 2014-08-17 DIAGNOSIS — IMO0002 Reserved for concepts with insufficient information to code with codable children: Secondary | ICD-10-CM

## 2014-08-17 DIAGNOSIS — E1165 Type 2 diabetes mellitus with hyperglycemia: Secondary | ICD-10-CM

## 2014-08-17 DIAGNOSIS — E1159 Type 2 diabetes mellitus with other circulatory complications: Secondary | ICD-10-CM

## 2014-08-17 LAB — BASIC METABOLIC PANEL
BUN: 12 mg/dL (ref 6–23)
CALCIUM: 9.7 mg/dL (ref 8.4–10.5)
CO2: 26 mEq/L (ref 19–32)
CREATININE: 1.05 mg/dL (ref 0.40–1.50)
Chloride: 98 mEq/L (ref 96–112)
GFR: 76.63 mL/min (ref >60.00)
Glucose, Bld: 211 mg/dL — ABNORMAL HIGH (ref 70–99)
Potassium: 4.3 mEq/L (ref 3.5–5.1)
SODIUM: 132 meq/L — AB (ref 135–145)

## 2014-08-17 LAB — LIPID PANEL
Cholesterol: 210 mg/dL — ABNORMAL HIGH (ref 0–200)
HDL: 26.3 mg/dL — ABNORMAL LOW (ref >39.00)
Total CHOL/HDL Ratio: 8

## 2014-08-17 LAB — HEMOGLOBIN A1C: HEMOGLOBIN A1C: 7.1 % — AB (ref 4.6–6.5)

## 2014-08-17 LAB — LDL CHOLESTEROL, DIRECT: Direct LDL: 49 mg/dL

## 2014-08-17 NOTE — Telephone Encounter (Signed)
Received message from [facility] lab stating that they were unable to collect a urine from patient, they request micro albumin order reentered for next week. Lab order re entered per their request.

## 2014-08-21 ENCOUNTER — Other Ambulatory Visit: Payer: Self-pay | Admitting: Cardiovascular Disease

## 2014-08-21 DIAGNOSIS — I739 Peripheral vascular disease, unspecified: Secondary | ICD-10-CM

## 2014-08-22 ENCOUNTER — Encounter: Payer: Self-pay | Admitting: Internal Medicine

## 2014-08-22 ENCOUNTER — Ambulatory Visit (INDEPENDENT_AMBULATORY_CARE_PROVIDER_SITE_OTHER): Payer: Commercial Managed Care - HMO | Admitting: Internal Medicine

## 2014-08-22 VITALS — BP 140/84 | HR 94 | Temp 97.7°F | Ht 70.0 in | Wt 193.0 lb

## 2014-08-22 DIAGNOSIS — I739 Peripheral vascular disease, unspecified: Secondary | ICD-10-CM

## 2014-08-22 DIAGNOSIS — E1159 Type 2 diabetes mellitus with other circulatory complications: Secondary | ICD-10-CM

## 2014-08-22 DIAGNOSIS — E785 Hyperlipidemia, unspecified: Secondary | ICD-10-CM

## 2014-08-22 DIAGNOSIS — I10 Essential (primary) hypertension: Secondary | ICD-10-CM

## 2014-08-22 DIAGNOSIS — Z23 Encounter for immunization: Secondary | ICD-10-CM

## 2014-08-22 DIAGNOSIS — Z0000 Encounter for general adult medical examination without abnormal findings: Secondary | ICD-10-CM | POA: Diagnosis not present

## 2014-08-22 DIAGNOSIS — E1151 Type 2 diabetes mellitus with diabetic peripheral angiopathy without gangrene: Secondary | ICD-10-CM | POA: Diagnosis not present

## 2014-08-22 NOTE — Progress Notes (Signed)
Subjective:    Patient ID: [name], male    DOB: 1956-01-24, 59 y.o.   MRN: [id number]  HPI   Here for medicare wellness  Diet: heart healthy, diabetic Physical activity: sedentary Depression/mood screen: negative Hearing: intact to whispered voice Visual acuity: grossly normal, performs annual eye exam  ADLs: capable Fall risk: none Home safety: good Cognitive evaluation: intact to orientation, naming, recall and repetition EOL planning: adv directives, full code/ I agree  I have personally reviewed and have noted 1. The patient's medical and social history 2. Their use of alcohol, tobacco or illicit drugs 3. Their current medications and supplements 4. The patient's functional ability including ADL's, fall risks, home safety risks and hearing or visual impairment. 5. Diet and physical activities 6. Evidence for depression or mood disorders  Also reviewed chronic medical issues and interval events   Past Medical History  Diagnosis Date  . Atherosclerosis of native arteries of the extremities with rest pain   . Coronary atherosclerosis of native coronary artery   . PAD (peripheral artery disease)   . DM type 2 (diabetes mellitus, type 2)   . Other and unspecified hyperlipidemia   . Unspecified essential hypertension   . Tobacco use disorder   . H/O alcohol abuse     quit 2011 - dry since that time  . H/O: obesity     lost 100lbs  . Non Hodgkin's lymphoma 2000    tx with XRT at [facility], in remission  . Myocardial infarction June 9,2009  . Osteoarthritis     right hip   Family History  Problem Relation Age of Onset  . Heart attack Mother     52's  . Heart disease Mother     Heart disease before age 66  . Hypertension Mother   . Hyperlipidemia Mother   . Peripheral Artery Disease Father   . Hypertension Father   . Hyperlipidemia Father   . Peripheral vascular disease Father   . Heart attack      40's/40's   History  Substance Use Topics  .  Smoking status: Former Smoker -- 0.50 packs/day for 43 years    Types: Cigarettes  . Smokeless tobacco: Never Used     Comment: heavy 2nd hand exposue ongoing  . Alcohol Use: No     Comment: former heavy user quit 2011    Review of Systems  Constitutional: Negative for fever, activity change, appetite change, fatigue and unexpected weight change.  Respiratory: Negative for cough, chest tightness, shortness of breath and wheezing.   Cardiovascular: Negative for chest pain, palpitations and leg swelling.  Neurological: Negative for dizziness, weakness and headaches.  Psychiatric/Behavioral: Negative for dysphoric mood. The patient is not nervous/anxious.   All other systems reviewed and are negative.      Objective:    Physical Exam  Constitutional: He appears well-developed and well-nourished. No distress.  Cardiovascular: Normal rate, regular rhythm and normal heart sounds.   No murmur heard. Pulmonary/Chest: Effort normal and breath sounds normal. No respiratory distress.    BP 140/84 mmHg  Pulse 94  Temp(Src) 97.7 F (36.5 C) (Oral)  Ht 5\' 10"  (1.778 m)  Wt 193 lb (87.544 kg)  BMI 27.69 kg/m2  SpO2 94% Wt Readings from Last 3 Encounters:  08/22/14 193 lb (87.544 kg)  03/24/14 190 lb (86.183 kg)  02/20/14 191 lb (86.637 kg)    Lab Results  Component Value Date   WBC 9.1 10/28/2011   HGB 16.9 10/28/2011  HCT 46.6 10/28/2011   PLT 211 10/28/2011   GLUCOSE 211* 08/17/2014   CHOL 210* 08/17/2014   TRIG * 08/17/2014    1372.0 Triglyceride is over 400; calculations on Lipids are invalid.   HDL 26.30* 08/17/2014   LDLDIRECT 49.0 08/17/2014   LDLCALC 35 09/19/2013   ALT 18 03/22/2013   AST 16 03/22/2013   NA 132* 08/17/2014   K 4.3 08/17/2014   CL 98 08/17/2014   CREATININE 1.05 08/17/2014   BUN 12 08/17/2014   CO2 26 08/17/2014   TSH 1.10 10/16/2011   PSA 0.61 10/16/2011   INR 1.0 ratio 06/11/2009   HGBA1C 7.1* 08/17/2014   MICROALBUR 3.1* 11/29/2012     No results found.     Assessment & Plan:   AWV/[id number]- Today patient counseled on age appropriate routine health concerns for screening and prevention, each reviewed and up to date or declined. Immunizations reviewed and up to date or declined. Labs ordered and reviewed. Risk factors for depression reviewed and negative. Hearing function and visual acuity are intact. ADLs screened and addressed as needed. Functional ability and level of safety reviewed and appropriate. Education, counseling and referrals performed based on assessed risks today. Patient provided with a copy of personalized plan for preventive services.  Problem List Items Addressed This Visit    Dyslipidemia    High TGs - no longer on Lovaza due to cost Continue prava as tolerated by MSkel pain (atorva caused SE) - last lipids reviewed Follow with cards as ongoing      Essential hypertension    BP Readings from Last 3 Encounters:  08/22/14 140/84  06/14/14 152/82  03/24/14 120/80   The current medical regimen is generally effective;  continue present plan and medications.      PAD (peripheral artery disease)    Known severe PAD (see PMH).  last ABI reviewed - no significant change Quit smoking (reports last cigarette 05/2012, but heavy 2nd smoke exposure) Symptoms stable, not at rest and no pedal ulcers.  on cilostazol and either Plavix or Brilinta (study drug) Follows with [facility] Dr. Trula Slade: fem-pop bypass on right would be option if symptoms become intolerable.      Type 2 diabetes mellitus with vascular disease    Edema with Actos (SE) and Januvia ineffective +$$ prohib prev amaryl + metformin Stopped [facility] 08/2013 because of ?hypoglycemic symptoms with a1c<7 Then resumed low dose Amaryl 1mg  daily to keep a1c <8 Plans to follow with me (PCP) rather than endo On ASA, statin and ACEI Check a1c q39mo, titrate as needed  Lab Results  Component Value Date   HGBA1C 7.1* 08/17/2014         Other Visit  Diagnoses    Need for prophylactic vaccination with tetanus toxoid alone    -  Primary    Routine general medical examination at a health care facility            Gwendolyn Grant, MD

## 2014-08-22 NOTE — Assessment & Plan Note (Signed)
Known severe PAD (see PMH).  last ABI reviewed - no significant change Quit smoking (reports last cigarette 05/2012, but heavy 2nd smoke exposure) Symptoms stable, not at rest and no pedal ulcers.  on cilostazol and either Plavix or Brilinta (study drug) Follows with [facility] Dr. Trula Slade: fem-pop bypass on right would be option if symptoms become intolerable.

## 2014-08-22 NOTE — Progress Notes (Signed)
Pre visit review using our clinic review tool, if applicable. No additional management support is needed unless otherwise documented below in the visit note. 

## 2014-08-22 NOTE — Assessment & Plan Note (Signed)
High TGs - no longer on Lovaza due to cost Continue prava as tolerated by MSkel pain (atorva caused SE) - last lipids reviewed Follow with cards as ongoing

## 2014-08-22 NOTE — Assessment & Plan Note (Signed)
Edema with Actos (SE) and Januvia ineffective +$$ prohib prev amaryl + metformin Stopped [facility] 08/2013 because of ?hypoglycemic symptoms with a1c<7 Then resumed low dose Amaryl 1mg  daily to keep a1c <8 Plans to follow with me (PCP) rather than endo On ASA, statin and ACEI Check a1c q39mo, titrate as needed  Lab Results  Component Value Date   HGBA1C 7.1* 08/17/2014

## 2014-08-22 NOTE — Patient Instructions (Signed)
It was good to see you today.  We have reviewed your prior records including labs and tests today  Test(s) ordered today. Your results will be released to MyChart (or called to you) after review, usually within 72hours after test completion. If any changes need to be made, you will be notified at that same time.  Medications reviewed and updated, no changes recommended at this time. Refill on medication(s) as discussed today.  Please schedule followup in 6 months, call sooner if problems.  

## 2014-08-22 NOTE — Assessment & Plan Note (Signed)
BP Readings from Last 3 Encounters:  08/22/14 140/84  06/14/14 152/82  03/24/14 120/80   The current medical regimen is generally effective;  continue present plan and medications.

## 2014-08-28 ENCOUNTER — Ambulatory Visit (INDEPENDENT_AMBULATORY_CARE_PROVIDER_SITE_OTHER): Payer: Commercial Managed Care - HMO | Admitting: Podiatry

## 2014-08-28 DIAGNOSIS — M79673 Pain in unspecified foot: Secondary | ICD-10-CM | POA: Diagnosis not present

## 2014-08-28 DIAGNOSIS — M722 Plantar fascial fibromatosis: Secondary | ICD-10-CM

## 2014-08-28 DIAGNOSIS — B351 Tinea unguium: Secondary | ICD-10-CM

## 2014-08-28 NOTE — Progress Notes (Signed)
Subjective:     Patient ID: [name], male   DOB: [birth year], 59 y.o.   MRN: [id number]  HPI patient's found to have continued thick painful nailbeds 1-5 both feet and at risk diabetic with long-term diabetes   Review of Systems     Objective:   Physical Exam Neurovascular status remains unchanged with thick yellow brittle nailbeds 1-5 both feet the become ingrown in the corners and are painful and impossible for him to cut    Assessment:     I chronic nail infections with pain 1-5 both feet    Plan:     Debride painful nailbeds 1-5 both feet with no iatrogenic bleeding noted

## 2014-09-10 ENCOUNTER — Encounter: Payer: Self-pay | Admitting: Internal Medicine

## 2014-09-10 DIAGNOSIS — M25559 Pain in unspecified hip: Principal | ICD-10-CM

## 2014-09-10 DIAGNOSIS — G8929 Other chronic pain: Secondary | ICD-10-CM

## 2014-09-13 ENCOUNTER — Ambulatory Visit (HOSPITAL_COMMUNITY): Payer: Commercial Managed Care - HMO | Attending: Cardiology

## 2014-09-13 ENCOUNTER — Other Ambulatory Visit: Payer: Self-pay | Admitting: *Deleted

## 2014-09-13 DIAGNOSIS — I739 Peripheral vascular disease, unspecified: Secondary | ICD-10-CM | POA: Diagnosis not present

## 2014-09-13 DIAGNOSIS — M160 Bilateral primary osteoarthritis of hip: Secondary | ICD-10-CM | POA: Diagnosis not present

## 2014-09-20 DIAGNOSIS — M25552 Pain in left hip: Secondary | ICD-10-CM | POA: Diagnosis not present

## 2014-09-26 ENCOUNTER — Ambulatory Visit (INDEPENDENT_AMBULATORY_CARE_PROVIDER_SITE_OTHER): Payer: Commercial Managed Care - HMO | Admitting: Internal Medicine

## 2014-09-26 ENCOUNTER — Encounter: Payer: Self-pay | Admitting: Internal Medicine

## 2014-09-26 VITALS — BP 138/78 | HR 83 | Temp 97.6°F | Ht 70.0 in | Wt 189.8 lb

## 2014-09-26 DIAGNOSIS — M7062 Trochanteric bursitis, left hip: Secondary | ICD-10-CM

## 2014-09-26 MED ORDER — METHYLPREDNISOLONE ACETATE 40 MG/ML IJ SUSP
40.0000 mg | Freq: Once | INTRAMUSCULAR | Status: AC
  Administered 2014-09-26: 40 mg via INTRA_ARTICULAR

## 2014-09-26 NOTE — Progress Notes (Signed)
Subjective:    Patient ID: [name], male    DOB: 1955/09/29, 59 y.o.   MRN: [id number]  HPI  Patient here for hip pain -chronic right, new left Denies injury or recent trauma precipitating same Radiates into the lateral thigh, denies associated change in chronic back pain; no fever Unable to take anti-inflammatory's because of other antiplatelets prescribed Referral to orthopedics for same; seen last week for IA injection which has improved the groin pain, but persiting bursa pain (prior injection for same on R in 2014)  Past Medical History  Diagnosis Date  . Atherosclerosis of native arteries of the extremities with rest pain   . Coronary atherosclerosis of native coronary artery   . PAD (peripheral artery disease)   . DM type 2 (diabetes mellitus, type 2)   . Other and unspecified hyperlipidemia   . Unspecified essential hypertension   . Tobacco use disorder   . H/O alcohol abuse     quit 2011 - dry since that time  . H/O: obesity     lost 100lbs  . Non Hodgkin's lymphoma 2000    tx with XRT at [facility], in remission  . Myocardial infarction June 9,2009  . Osteoarthritis     right hip    Review of Systems  Constitutional: Negative for fever and fatigue.  Respiratory: Negative for cough and shortness of breath.   Cardiovascular: Negative for chest pain and leg swelling.  Musculoskeletal: Positive for back pain (chronic) and gait problem (chronic, uses cane).       Objective:    Physical Exam  Constitutional: He appears well-developed and well-nourished. No distress.  Cardiovascular: Normal rate, regular rhythm and normal heart sounds.   No murmur heard. Pulmonary/Chest: Effort normal and breath sounds normal. No respiratory distress.  Musculoskeletal:  Intense reproducible pain over greater troch bursa on left;    BP 138/78 mmHg  Pulse 83  Temp(Src) 97.6 F (36.4 C) (Oral)  Ht 5\' 10"  (1.778 m)  Wt 189 lb 12 oz (86.07 kg)  BMI 27.23 kg/m2  SpO2  93% Wt Readings from Last 3 Encounters:  09/26/14 189 lb 12 oz (86.07 kg)  08/22/14 193 lb (87.544 kg)  03/24/14 190 lb (86.183 kg)     Lab Results  Component Value Date   WBC 9.1 10/28/2011   HGB 16.9 10/28/2011   HCT 46.6 10/28/2011   PLT 211 10/28/2011   GLUCOSE 211* 08/17/2014   CHOL 210* 08/17/2014   TRIG * 08/17/2014    1372.0 Triglyceride is over 400; calculations on Lipids are invalid.   HDL 26.30* 08/17/2014   LDLDIRECT 49.0 08/17/2014   LDLCALC 35 09/19/2013   ALT 18 03/22/2013   AST 16 03/22/2013   NA 132* 08/17/2014   K 4.3 08/17/2014   CL 98 08/17/2014   CREATININE 1.05 08/17/2014   BUN 12 08/17/2014   CO2 26 08/17/2014   TSH 1.10 10/16/2011   PSA 0.61 10/16/2011   INR 1.0 ratio 06/11/2009   HGBA1C 7.1* 08/17/2014   MICROALBUR 3.1* 11/29/2012    No results found.   Procedure Note:  Greater trochanteric bursa injection, left the patient elects to proceed after verbal consent is obtained. the patient informed of possible risks and complications prior to procedure. Using sterile technique throughout, patient is injected with 1:3 DepoMedrol (40 mg): 0.5% Marcaine into trochanteric bursa at site of maximal tenderness. the patient tolerated the procedure well. Ice 24-48h, heat thereafter as needed instructions aftercare provided.  Assessment & Plan:   Greater trochanteric bursitis, left. History of same on right responding well to start injection. Procedure as noted above, tolerated well. Aftercare instructions provided. Patient to follow-up with orthopedics as needed for intra-articular OA flare symptoms  Problem List Items Addressed This Visit    None    Visit Diagnoses    Greater trochanteric bursitis of left hip    -  Primary        Gwendolyn Grant, MD

## 2014-09-26 NOTE — Progress Notes (Signed)
Pre visit review using our clinic review tool, if applicable. No additional management support is needed unless otherwise documented below in the visit note. 

## 2014-09-26 NOTE — Patient Instructions (Signed)
Trochanteric Bursitis You have hip pain due to trochanteric bursitis. Bursitis means that the sack near the outside of the hip is filled with fluid and inflamed. This sack is made up of protective soft tissue. The pain from trochanteric bursitis can be severe and keep you from sleep. It can radiate to the buttocks or down the outside of the thigh to the knee. The pain is almost always worse when rising from the seated or lying position and with walking. Pain can improve after you take a few steps. It happens more often in people with hip joint and lumbar spine problems, such as arthritis or previous surgery. Very rarely the trochanteric bursa can become infected, and antibiotics and/or surgery may be needed. Treatment often includes an injection of local anesthetic mixed with cortisone medicine. This medicine is injected into the area where it is most tender over the hip. Repeat injections may be necessary if the response to treatment is slow. You can apply ice packs over the tender area for 30 minutes every 2 hours for the next few days. Anti-inflammatory and/or narcotic pain medicine may also be helpful. Limit your activity for the next few days if the pain continues. See your caregiver in 5-10 days if you are not greatly improved.  SEEK IMMEDIATE MEDICAL CARE IF:  You develop severe pain, fever, or increased redness.  You have pain that radiates below the knee. EXERCISES STRETCHING EXERCISES - Trochanteric Bursitis  These exercises may help you when beginning to rehabilitate your injury. Your symptoms may resolve with or without further involvement from your physician, physical therapist, or athletic trainer. While completing these exercises, remember:   Restoring tissue flexibility helps normal motion to return to the joints. This allows healthier, less painful movement and activity.  An effective stretch should be held for at least 30 seconds.  A stretch should never be painful. You should only  feel a gentle lengthening or release in the stretched tissue. STRETCH - Iliotibial Band  On the floor or bed, lie on your side so your injured leg is on top. Bend your knee and grab your ankle.  Slowly bring your knee back so that your thigh is in line with your trunk. Keep your heel at your buttocks and gently arch your back so your head, shoulders and hips line up.  Slowly lower your leg so that your knee approaches the floor/bed until you feel a gentle stretch on the outside of your thigh. If you do not feel a stretch and your knee will not fall farther, place the heel of your opposite foot on top of your knee and pull your thigh down farther.  Hold this stretch for __________ seconds.  Repeat __________ times. Complete this exercise __________ times per day. STRETCH - Hamstrings, Supine   Lie on your back. Loop a belt or towel over the ball of your foot as shown.  Straighten your knee and slowly pull on the belt to raise your injured leg. Do not allow the knee to bend. Keep your opposite leg flat on the floor.  Raise the leg until you feel a gentle stretch behind your knee or thigh. Hold this position for __________ seconds.  Repeat __________ times. Complete this stretch __________ times per day. STRETCH - Quadriceps, Prone   Lie on your stomach on a firm surface, such as a bed or padded floor.  Bend your knee and grasp your ankle. If you are unable to reach your ankle or pant leg, use a belt   around your foot to lengthen your reach.  Gently pull your heel toward your buttocks. Your knee should not slide out to the side. You should feel a stretch in the front of your thigh and/or knee.  Hold this position for __________ seconds.  Repeat __________ times. Complete this stretch __________ times per day. STRETCHING - Hip Flexors, Lunge Half kneel with your knee on the floor and your opposite knee bent and directly over your ankle.  Keep good posture with your head over your  shoulders. Tighten your buttocks to point your tailbone downward; this will prevent your back from arching too much.  You should feel a gentle stretch in the front of your thigh and/or hip. If you do not feel any resistance, slightly slide your opposite foot forward and then slowly lunge forward so your knee once again lines up over your ankle. Be sure your tailbone remains pointed downward.  Hold this stretch for __________ seconds.  Repeat __________ times. Complete this stretch __________ times per day. STRETCH - Adductors, Lunge  While standing, spread your legs.  Lean away from your injured leg by bending your opposite knee. You may rest your hands on your thigh for balance.  You should feel a stretch in your inner thigh. Hold for __________ seconds.  Repeat __________ times. Complete this exercise __________ times per day. Document Released: 05/29/2004 Document Revised: 09/05/2013 Document Reviewed: 08/03/2008 Exit[employer] Patient Information 2015 [employer]. This information is not intended to replace advice given to you by your health care provider. Make sure you discuss any questions you have with your health care provider.  

## 2014-09-29 ENCOUNTER — Other Ambulatory Visit: Payer: Self-pay | Admitting: *Deleted

## 2014-09-29 MED ORDER — CLOPIDOGREL BISULFATE 75 MG PO TABS: 75.0000 mg | ORAL_TABLET | Freq: Every day | ORAL | Status: DC

## 2014-10-03 DIAGNOSIS — M25552 Pain in left hip: Secondary | ICD-10-CM | POA: Diagnosis not present

## 2014-10-04 ENCOUNTER — Encounter: Payer: Self-pay | Admitting: Internal Medicine

## 2014-10-05 ENCOUNTER — Telehealth: Payer: Self-pay

## 2014-10-05 NOTE — Telephone Encounter (Signed)
Prior auth sent to [facility] for Plavix 75mg  via Cover My Meds.

## 2014-10-06 ENCOUNTER — Telehealth: Payer: Self-pay

## 2014-10-06 NOTE — Telephone Encounter (Signed)
PA started via fax.

## 2014-10-06 NOTE — Telephone Encounter (Signed)
Received fax from [facility] authorizing Plavix 75mg  only good through 09/03/216.

## 2014-10-11 ENCOUNTER — Other Ambulatory Visit: Payer: Self-pay

## 2014-10-11 MED ORDER — CLOPIDOGREL BISULFATE 75 MG PO TABS: 75.0000 mg | ORAL_TABLET | Freq: Every day | ORAL | Status: DC

## 2014-10-12 DIAGNOSIS — M25552 Pain in left hip: Secondary | ICD-10-CM | POA: Diagnosis not present

## 2014-10-19 ENCOUNTER — Encounter: Payer: Self-pay | Admitting: Internal Medicine

## 2014-11-04 ENCOUNTER — Other Ambulatory Visit: Payer: Self-pay | Admitting: Cardiology

## 2014-11-07 ENCOUNTER — Encounter (HOSPITAL_COMMUNITY): Payer: Self-pay | Admitting: Physical Medicine and Rehabilitation

## 2014-11-07 ENCOUNTER — Emergency Department (HOSPITAL_COMMUNITY)
Admission: EM | Admit: 2014-11-07 | Discharge: 2014-11-07 | Discharge status: Against Medical Advice | Payer: Commercial Managed Care - HMO | Attending: Emergency Medicine | Admitting: Emergency Medicine

## 2014-11-07 ENCOUNTER — Telehealth: Payer: Self-pay | Admitting: *Deleted

## 2014-11-07 DIAGNOSIS — M79672 Pain in left foot: Secondary | ICD-10-CM | POA: Insufficient documentation

## 2014-11-07 DIAGNOSIS — R2242 Localized swelling, mass and lump, left lower limb: Secondary | ICD-10-CM | POA: Insufficient documentation

## 2014-11-07 DIAGNOSIS — I10 Essential (primary) hypertension: Secondary | ICD-10-CM | POA: Diagnosis not present

## 2014-11-07 DIAGNOSIS — E119 Type 2 diabetes mellitus without complications: Secondary | ICD-10-CM | POA: Diagnosis not present

## 2014-11-07 DIAGNOSIS — I2510 Atherosclerotic heart disease of native coronary artery without angina pectoris: Secondary | ICD-10-CM | POA: Insufficient documentation

## 2014-11-07 NOTE — ED Notes (Signed)
Called x 2 to move to room.  No answer.

## 2014-11-07 NOTE — Telephone Encounter (Signed)
Pt states his foot is red and swollen, please call.  I called and pt's wife states he went to urgent care.

## 2014-11-07 NOTE — ED Notes (Signed)
Called x 2.   No answer.   Patient will be taken out of system.  Tried to call patient's number on file.   No answer.

## 2014-11-07 NOTE — ED Notes (Signed)
Called for patient multiple times in ER waiting room, immediately outside ER, in waiting room bathrooms and in sub waiting area without response.

## 2014-11-07 NOTE — ED Notes (Signed)
Patient called x 2 to move to a room.  No answer.

## 2014-11-07 NOTE — ED Notes (Signed)
Pt reports L foot pain and swelling. Ongoing since Sunday morning. L foot red and warm to touch upon arrival to ED. Pt is alert and oriented x4. NAD

## 2014-11-08 ENCOUNTER — Encounter: Payer: Self-pay | Admitting: Podiatry

## 2014-11-08 ENCOUNTER — Ambulatory Visit (INDEPENDENT_AMBULATORY_CARE_PROVIDER_SITE_OTHER): Payer: Commercial Managed Care - HMO

## 2014-11-08 ENCOUNTER — Ambulatory Visit (INDEPENDENT_AMBULATORY_CARE_PROVIDER_SITE_OTHER): Payer: Commercial Managed Care - HMO | Admitting: Podiatry

## 2014-11-08 VITALS — BP 96/71 | HR 110 | Resp 15

## 2014-11-08 DIAGNOSIS — L02619 Cutaneous abscess of unspecified foot: Secondary | ICD-10-CM

## 2014-11-08 DIAGNOSIS — M79672 Pain in left foot: Secondary | ICD-10-CM

## 2014-11-08 DIAGNOSIS — Q828 Other specified congenital malformations of skin: Secondary | ICD-10-CM | POA: Diagnosis not present

## 2014-11-08 DIAGNOSIS — E1151 Type 2 diabetes mellitus with diabetic peripheral angiopathy without gangrene: Secondary | ICD-10-CM

## 2014-11-08 DIAGNOSIS — L03119 Cellulitis of unspecified part of limb: Secondary | ICD-10-CM | POA: Diagnosis not present

## 2014-11-08 MED ORDER — CEPHALEXIN 500 MG PO CAPS: 500.0000 mg | ORAL_CAPSULE | Freq: Three times a day (TID) | ORAL | Status: DC

## 2014-11-08 NOTE — Progress Notes (Signed)
Subjective:     Patient ID: [name], male   DOB: 1955/12/19, 59 y.o.   MRN: [id number]  HPI patient states my left foot has been red and even though it's feeling some better it still is discolored. I went to the emergency room yesterday but they didn't do anything for me and I did have a low-grade temperature. I have this lesion underneath my fifth metatarsal but I've not noted any drainage and my sugar was under good control   Review of Systems     Objective:   Physical Exam Neurovascular status is found to be unchanged with erythema in the left forefoot localized in nature with no proximal edema erythema or drainage noted. Lesion underneath the fifth metatarsal left that is mildly painful but no acute drainage is noted and no current systemic indications of infection    Assessment:     Porokeratotic type lesion and at risk diabetic which may have allowed bacteria entrance and possible forefoot infection localized in nature. Patient also may be dealing with inflammation    Plan:     H&P and x-rays reviewed. Today I debrided the lesion fully and did not no drainage and his precautionary measure placed patient on cephalexin 500 mg 3 times a day and reappoint for [place] to recheck again in the next several weeks and gave instructions on what to do if there should be any increase in edema erythema or drainage noted

## 2014-11-09 ENCOUNTER — Other Ambulatory Visit: Payer: Self-pay

## 2014-11-09 MED ORDER — METOPROLOL TARTRATE 50 MG PO TABS: 50.0000 mg | ORAL_TABLET | Freq: Two times a day (BID) | ORAL | Status: DC

## 2014-11-28 ENCOUNTER — Ambulatory Visit: Payer: Commercial Managed Care - HMO | Admitting: Podiatry

## 2014-12-04 ENCOUNTER — Telehealth: Payer: Self-pay | Admitting: Internal Medicine

## 2014-12-04 DIAGNOSIS — S91302A Unspecified open wound, left foot, initial encounter: Secondary | ICD-10-CM

## 2014-12-04 NOTE — Telephone Encounter (Signed)
Patient has an open sore on his left foot that he has had for a month that is not healing.  Patient does not want to see anyone else.  He is requesting Dr. Asa Lente to work him in.  Please advise.

## 2014-12-05 ENCOUNTER — Encounter: Payer: Self-pay | Admitting: Internal Medicine

## 2014-12-05 NOTE — Telephone Encounter (Signed)
Not able to work in per PCP. Okay to refer to wound care if needed.

## 2014-12-05 NOTE — Telephone Encounter (Signed)
Spoke with pt and he refuses to see another provider in this office. Pt stated that he is looking for a new pcp. Gave pt option of being sent to wound care center per PCP advise and pt stated that he would see someone there. Orders entered with dx:

## 2014-12-11 ENCOUNTER — Telehealth: Payer: Self-pay | Admitting: Cardiology

## 2014-12-11 DIAGNOSIS — R0989 Other specified symptoms and signs involving the circulatory and respiratory systems: Secondary | ICD-10-CM | POA: Insufficient documentation

## 2014-12-11 DIAGNOSIS — I739 Peripheral vascular disease, unspecified: Secondary | ICD-10-CM

## 2014-12-11 HISTORY — DX: Other specified symptoms and signs involving the circulatory and respiratory systems: R09.89

## 2014-12-11 NOTE — Telephone Encounter (Signed)
Pt calling to ask if he could now go ahead and schedule his carotid doppler that was ordered by Dr Aundra Dubin back last Nov. 2015 at pts last OV with him.  Pt states that he couldn't afford to have this study done last year, for insurance reasons.  No orders noted in pts appts, but Dr Aundra Dubin did notate in his dictation note from 03/24/14 that he wanted the pt to have a carotid doppler done to check left carotid bruit.  Informed the pt that I will place this order in the system and send our [facility] schedulers a message to contact the pt and schedule this.  Pt verbalized understanding and agrees with this plan.  Will send this message to Dr Aundra Dubin and nurse to make them both aware that pt will be proceeding with this test.

## 2014-12-11 NOTE — Telephone Encounter (Signed)
New Message       Pt calling stating that the last time he saw Dr. Aundra Dubin, he wanted the pt to have a test done and pt couldn't afford it at that time. Pt is now calling to schedule that test, there are no orders in Epic. Please call pt back and advise.

## 2014-12-15 ENCOUNTER — Ambulatory Visit (HOSPITAL_COMMUNITY)
Admission: RE | Admit: 2014-12-15 | Discharge: 2014-12-15 | Disposition: A | Discharge status: Home / Self Care | Payer: Commercial Managed Care - HMO | Source: Ambulatory Visit | Attending: Cardiology | Admitting: Cardiology

## 2014-12-15 DIAGNOSIS — I6523 Occlusion and stenosis of bilateral carotid arteries: Secondary | ICD-10-CM | POA: Diagnosis not present

## 2014-12-15 DIAGNOSIS — I739 Peripheral vascular disease, unspecified: Secondary | ICD-10-CM | POA: Diagnosis not present

## 2014-12-15 DIAGNOSIS — R0989 Other specified symptoms and signs involving the circulatory and respiratory systems: Secondary | ICD-10-CM | POA: Diagnosis not present

## 2014-12-22 DIAGNOSIS — M545 Low back pain: Secondary | ICD-10-CM | POA: Diagnosis not present

## 2014-12-22 DIAGNOSIS — M5416 Radiculopathy, lumbar region: Secondary | ICD-10-CM | POA: Diagnosis not present

## 2014-12-25 ENCOUNTER — Encounter (HOSPITAL_BASED_OUTPATIENT_CLINIC_OR_DEPARTMENT_OTHER): Payer: Commercial Managed Care - HMO | Attending: Plastic Surgery

## 2014-12-25 DIAGNOSIS — I70223 Atherosclerosis of native arteries of extremities with rest pain, bilateral legs: Secondary | ICD-10-CM | POA: Insufficient documentation

## 2014-12-25 DIAGNOSIS — I10 Essential (primary) hypertension: Secondary | ICD-10-CM | POA: Diagnosis not present

## 2014-12-25 DIAGNOSIS — I252 Old myocardial infarction: Secondary | ICD-10-CM | POA: Diagnosis not present

## 2014-12-25 DIAGNOSIS — I2510 Atherosclerotic heart disease of native coronary artery without angina pectoris: Secondary | ICD-10-CM | POA: Insufficient documentation

## 2014-12-25 DIAGNOSIS — L97522 Non-pressure chronic ulcer of other part of left foot with fat layer exposed: Secondary | ICD-10-CM | POA: Diagnosis not present

## 2014-12-25 DIAGNOSIS — E11622 Type 2 diabetes mellitus with other skin ulcer: Secondary | ICD-10-CM | POA: Diagnosis not present

## 2014-12-29 DIAGNOSIS — M545 Low back pain: Secondary | ICD-10-CM | POA: Diagnosis not present

## 2015-01-01 DIAGNOSIS — E11622 Type 2 diabetes mellitus with other skin ulcer: Secondary | ICD-10-CM | POA: Diagnosis not present

## 2015-01-01 DIAGNOSIS — I70223 Atherosclerosis of native arteries of extremities with rest pain, bilateral legs: Secondary | ICD-10-CM | POA: Diagnosis not present

## 2015-01-01 DIAGNOSIS — L97522 Non-pressure chronic ulcer of other part of left foot with fat layer exposed: Secondary | ICD-10-CM | POA: Diagnosis not present

## 2015-01-01 DIAGNOSIS — I10 Essential (primary) hypertension: Secondary | ICD-10-CM | POA: Diagnosis not present

## 2015-01-01 DIAGNOSIS — I2510 Atherosclerotic heart disease of native coronary artery without angina pectoris: Secondary | ICD-10-CM | POA: Diagnosis not present

## 2015-01-01 DIAGNOSIS — I252 Old myocardial infarction: Secondary | ICD-10-CM | POA: Diagnosis not present

## 2015-01-05 DIAGNOSIS — M5416 Radiculopathy, lumbar region: Secondary | ICD-10-CM | POA: Diagnosis not present

## 2015-01-15 ENCOUNTER — Encounter (HOSPITAL_BASED_OUTPATIENT_CLINIC_OR_DEPARTMENT_OTHER): Payer: Commercial Managed Care - HMO | Attending: Plastic Surgery

## 2015-01-15 DIAGNOSIS — E11621 Type 2 diabetes mellitus with foot ulcer: Secondary | ICD-10-CM | POA: Diagnosis not present

## 2015-01-15 DIAGNOSIS — Z87891 Personal history of nicotine dependence: Secondary | ICD-10-CM | POA: Diagnosis not present

## 2015-01-15 DIAGNOSIS — I2510 Atherosclerotic heart disease of native coronary artery without angina pectoris: Secondary | ICD-10-CM | POA: Diagnosis not present

## 2015-01-15 DIAGNOSIS — Z923 Personal history of irradiation: Secondary | ICD-10-CM | POA: Diagnosis not present

## 2015-01-15 DIAGNOSIS — M1990 Unspecified osteoarthritis, unspecified site: Secondary | ICD-10-CM | POA: Diagnosis not present

## 2015-01-15 DIAGNOSIS — L97521 Non-pressure chronic ulcer of other part of left foot limited to breakdown of skin: Secondary | ICD-10-CM | POA: Diagnosis not present

## 2015-01-15 DIAGNOSIS — E1151 Type 2 diabetes mellitus with diabetic peripheral angiopathy without gangrene: Secondary | ICD-10-CM | POA: Diagnosis not present

## 2015-01-15 DIAGNOSIS — I10 Essential (primary) hypertension: Secondary | ICD-10-CM | POA: Insufficient documentation

## 2015-01-15 DIAGNOSIS — I252 Old myocardial infarction: Secondary | ICD-10-CM | POA: Insufficient documentation

## 2015-01-30 ENCOUNTER — Other Ambulatory Visit (INDEPENDENT_AMBULATORY_CARE_PROVIDER_SITE_OTHER): Payer: Commercial Managed Care - HMO

## 2015-01-30 ENCOUNTER — Encounter: Payer: Self-pay | Admitting: Internal Medicine

## 2015-01-30 ENCOUNTER — Ambulatory Visit (INDEPENDENT_AMBULATORY_CARE_PROVIDER_SITE_OTHER): Payer: Commercial Managed Care - HMO | Admitting: Internal Medicine

## 2015-01-30 VITALS — BP 140/84 | HR 87 | Temp 99.0°F | Resp 16 | Ht 70.0 in | Wt 192.0 lb

## 2015-01-30 DIAGNOSIS — E1151 Type 2 diabetes mellitus with diabetic peripheral angiopathy without gangrene: Secondary | ICD-10-CM

## 2015-01-30 DIAGNOSIS — E781 Pure hyperglyceridemia: Secondary | ICD-10-CM

## 2015-01-30 DIAGNOSIS — Z23 Encounter for immunization: Secondary | ICD-10-CM

## 2015-01-30 DIAGNOSIS — E785 Hyperlipidemia, unspecified: Secondary | ICD-10-CM | POA: Diagnosis not present

## 2015-01-30 DIAGNOSIS — I10 Essential (primary) hypertension: Secondary | ICD-10-CM | POA: Diagnosis not present

## 2015-01-30 DIAGNOSIS — I2510 Atherosclerotic heart disease of native coronary artery without angina pectoris: Secondary | ICD-10-CM | POA: Diagnosis not present

## 2015-01-30 DIAGNOSIS — Z1211 Encounter for screening for malignant neoplasm of colon: Secondary | ICD-10-CM

## 2015-01-30 DIAGNOSIS — E1159 Type 2 diabetes mellitus with other circulatory complications: Secondary | ICD-10-CM

## 2015-01-30 HISTORY — DX: Encounter for screening for malignant neoplasm of colon: Z12.11

## 2015-01-30 LAB — COMPREHENSIVE METABOLIC PANEL
ALBUMIN: 4.4 g/dL (ref 3.5–5.2)
ALK PHOS: 63 U/L (ref 39–117)
ALT: 15 U/L (ref 0–53)
AST: 15 U/L (ref 0–37)
BUN: 12 mg/dL (ref 6–23)
CALCIUM: 9.8 mg/dL (ref 8.4–10.5)
CHLORIDE: 98 meq/L (ref 96–112)
CO2: 25 mEq/L (ref 19–32)
Creatinine, Ser: 1.02 mg/dL (ref 0.40–1.50)
GFR: 79.12 mL/min (ref >60.00)
Glucose, Bld: 268 mg/dL — ABNORMAL HIGH (ref 70–99)
POTASSIUM: 4.3 meq/L (ref 3.5–5.1)
SODIUM: 134 meq/L — AB (ref 135–145)
TOTAL PROTEIN: 7.2 g/dL (ref 6.0–8.3)
Total Bilirubin: 0.3 mg/dL (ref 0.2–1.2)

## 2015-01-30 LAB — CBC WITH DIFFERENTIAL/PLATELET
BASOS PCT: 0.4 % (ref 0.0–3.0)
Basophils Absolute: 0 10*3/uL (ref 0.0–0.1)
EOS PCT: 3.6 % (ref 0.0–5.0)
Eosinophils Absolute: 0.4 10*3/uL (ref 0.0–0.7)
HEMATOCRIT: 48.1 % (ref 39.0–52.0)
HEMOGLOBIN: 16.8 g/dL (ref 13.0–17.0)
LYMPHS PCT: 30.3 % (ref 12.0–46.0)
Lymphs Abs: 3 10*3/uL (ref 0.7–4.0)
MCHC: 35 g/dL (ref 30.0–36.0)
MCV: 97.5 fl (ref 78.0–100.0)
MONOS PCT: 7.8 % (ref 3.0–12.0)
Monocytes Absolute: 0.8 10*3/uL (ref 0.1–1.0)
Neutro Abs: 5.7 10*3/uL (ref 1.4–7.7)
Neutrophils Relative %: 57.9 % (ref 43.0–77.0)
Platelets: 281 10*3/uL (ref 150.0–400.0)
RBC: 4.94 Mil/uL (ref 4.22–5.81)
RDW: 13.3 % (ref 11.5–15.5)
WBC: 9.8 10*3/uL (ref 4.0–10.5)

## 2015-01-30 LAB — LIPID PANEL
CHOLESTEROL: 228 mg/dL — AB (ref 0–200)
HDL: 30 mg/dL — ABNORMAL LOW (ref >39.00)
Total CHOL/HDL Ratio: 8

## 2015-01-30 LAB — LDL CHOLESTEROL, DIRECT: LDL DIRECT: 71 mg/dL

## 2015-01-30 LAB — HEMOGLOBIN A1C: HEMOGLOBIN A1C: 7.9 % — AB (ref 4.6–6.5)

## 2015-01-30 LAB — TSH: TSH: 1.04 u[IU]/mL (ref 0.35–4.50)

## 2015-01-30 NOTE — Patient Instructions (Signed)

## 2015-01-30 NOTE — Progress Notes (Signed)
Subjective:  Patient ID: [name], male    DOB: [birth year]  Age: 59 y.o. MRN: [id number]  CC: Hypertension; Hyperlipidemia; and Diabetes   HPI LILTON PARE presents for follow-up on diabetes. He is concerned that his blood sugars may be running high because he is experiencing some irritability, excessive thirst, and excessive urination. He offers no other complaints.  Outpatient Prescriptions Prior to Visit  Medication Sig Dispense Refill  . aspirin 81 MG tablet Take 81 mg by mouth daily.    . calcium elemental as carbonate (BARIATRIC TUMS ULTRA) 400 MG tablet     . cilostazol (PLETAL) 100 MG tablet Take 1 tablet (100 mg total) by mouth 2 (two) times daily. 180 tablet 3  . clopidogrel (PLAVIX) 75 MG tablet Take 1 tablet (75 mg total) by mouth daily. 90 tablet 2  . Diphenhydramine-Acetaminophen (PERCOGESIC PO) Take 2 tablets by mouth 2 (two) times daily as needed. For dental pain    . glimepiride (AMARYL) 1 MG tablet Take 1 tablet (1 mg total) by mouth daily with breakfast. 90 tablet 1  . glucose blood (TRUETEST TEST) test strip Use as instructed to check blood sugar twice daily dx 250.00 200 each 3  . isosorbide mononitrate (IMDUR) 30 MG 24 hr tablet Take 1 tablet (30 mg total) by mouth 2 (two) times daily. 180 tablet 3  . lisinopril (PRINIVIL,ZESTRIL) 40 MG tablet Take 1 tablet (40 mg total) by mouth daily. 90 tablet 2  . metFORMIN (GLUCOPHAGE-XR) 500 MG 24 hr tablet Take 2 tablets (1,000 mg total) by mouth 2 (two) times daily. 360 tablet 3  . metoprolol (LOPRESSOR) 50 MG tablet Take 1 tablet (50 mg total) by mouth 2 (two) times daily. 180 tablet 3  . pravastatin (PRAVACHOL) 80 MG tablet Take 1 tablet (80 mg total) by mouth daily. 90 tablet 3  . TRUEPLUS LANCETS 28G MISC 1 each by Does not apply route 2 (two) times daily. Dx 250.00 200 each 3  . cephALEXin (KEFLEX) 500 MG capsule Take 1 capsule (500 mg total) by mouth 3 (three) times daily. 500 capsule 1  . cephALEXin (KEFLEX)  500 MG capsule Take 1 capsule (500 mg total) by mouth 3 (three) times daily. 12 capsule 0   No facility-administered medications prior to visit.    ROS Review of Systems  Constitutional: Negative.  Negative for fever, chills, diaphoresis, activity change, appetite change, fatigue and unexpected weight change.  HENT: Negative.  Negative for trouble swallowing and voice change.   Eyes: Negative.  Negative for photophobia and visual disturbance.  Respiratory: Negative.  Negative for cough, choking, chest tightness, shortness of breath and stridor.   Cardiovascular: Negative.  Negative for chest pain, palpitations and leg swelling.  Gastrointestinal: Negative.  Negative for nausea, vomiting, abdominal pain, constipation and blood in stool.  Endocrine: Positive for polydipsia, polyphagia and polyuria.  Genitourinary: Negative.   Musculoskeletal: Positive for back pain and arthralgias. Negative for myalgias and neck pain.  Skin: Negative.  Negative for rash.  Allergic/Immunologic: Negative.   Neurological: Negative.  Negative for dizziness, tremors, syncope and headaches.  Hematological: Negative.  Negative for adenopathy. Does not bruise/bleed easily.  Psychiatric/Behavioral: Negative.  Negative for sleep disturbance and decreased concentration. The patient is not nervous/anxious.     Objective:  BP 140/84 mmHg  Pulse 87  Temp(Src) 99 F (37.2 C) (Oral)  Resp 16  Ht 5\' 10"  (1.778 m)  Wt 192 lb (87.091 kg)  BMI 27.55 kg/m2  SpO2 95%  BP Readings from Last 3 Encounters:  01/30/15 140/84  11/08/14 96/71  11/07/14 141/81    Wt Readings from Last 3 Encounters:  01/30/15 192 lb (87.091 kg)  11/07/14 190 lb (86.183 kg)  09/26/14 189 lb 12 oz (86.07 kg)    Physical Exam  Constitutional: He is oriented to person, place, and time. No distress.  HENT:  Mouth/Throat: Oropharynx is clear and moist. No oropharyngeal exudate.  Eyes: Conjunctivae are normal. Right eye exhibits no  discharge. Left eye exhibits no discharge. No scleral icterus.  Neck: Normal range of motion. Neck supple. No JVD present. No tracheal deviation present. No thyromegaly present.  Cardiovascular: Normal rate, regular rhythm, normal heart sounds and intact distal pulses.  Exam reveals no gallop and no friction rub.   No murmur heard. Pulmonary/Chest: Effort normal and breath sounds normal. No stridor. No respiratory distress. He has no wheezes. He has no rales. He exhibits no tenderness.  Abdominal: Soft. Bowel sounds are normal. He exhibits no distension and no mass. There is no tenderness. There is no rebound and no guarding.  Musculoskeletal: Normal range of motion. He exhibits no edema or tenderness.  Lymphadenopathy:    He has no cervical adenopathy.  Neurological: He is oriented to person, place, and time.  Skin: Skin is warm and dry. No rash noted. He is not diaphoretic. No erythema. No pallor.  Vitals reviewed.   Lab Results  Component Value Date   WBC 9.8 01/30/2015   HGB 16.8 01/30/2015   HCT 48.1 01/30/2015   PLT 281.0 01/30/2015   GLUCOSE 268* 01/30/2015   CHOL 228* 01/30/2015   TRIG * 01/30/2015    1324.0 Triglyceride is over 400; calculations on Lipids are invalid.   HDL 30.00* 01/30/2015   LDLDIRECT 71.0 01/30/2015   LDLCALC 35 09/19/2013   ALT 15 01/30/2015   AST 15 01/30/2015   NA 134* 01/30/2015   K 4.3 01/30/2015   CL 98 01/30/2015   CREATININE 1.02 01/30/2015   BUN 12 01/30/2015   CO2 25 01/30/2015   TSH 1.04 01/30/2015   PSA 0.61 10/16/2011   INR 1.0 ratio 06/11/2009   HGBA1C 7.9* 01/30/2015   MICROALBUR 7.2* 01/30/2015    No results found.  Assessment & Plan:   March was seen today for hypertension, hyperlipidemia and diabetes.  Diagnoses and all orders for this visit:  Atherosclerosis of native coronary artery of native heart without angina pectoris- he has not recently experienced any chest pain or shortness of breath, this appears to be stable,  will continue risk factor modification. -     Lipid panel; Future  Essential hypertension- his blood pressure is well-controlled, electrolytes and renal function are stable. -     Comprehensive metabolic panel; Future -     CBC with Differential/Platelet; Future -     Urinalysis, Routine w reflex microscopic (not at [facility]); Future  Type 2 diabetes mellitus with vascular disease- his blood sugars are not well-controlled, his A1c is 7.9%. I have asked him to add once daily, basal insulin to his oral regimen for better blood sugar control. -     Lipid panel; Future -     Microalbumin / creatinine urine ratio; Future -     Hemoglobin A1c; Future -     Ambulatory referral to Ophthalmology  Hypertriglyceridemia- his level is greater than 1000,  I have asked him to start Lovasa and insulin to lower his triglycerides -     Lipid panel; Future -  omega-3 acid ethyl esters (LOVAZA) 1 G capsule; Take 2 capsules (2 g total) by mouth 2 (two) times daily.  Hyperlipidemia with target LDL less than 100- he has achieved his LDL goal and is doing well on the statin -     TSH; Future  Need for influenza vaccination -     Flu Vaccine QUAD 36+ mos IM  Colon cancer screening -     Ambulatory referral to Gastroenterology  I have discontinued Mr. Milsap's cephALEXin and cephALEXin. I am also having him start on omega-3 acid ethyl esters. Additionally, I am having him maintain his Diphenhydramine-Acetaminophen (PERCOGESIC PO), glucose blood, TRUEPLUS LANCETS 28G, cilostazol, isosorbide mononitrate, metFORMIN, pravastatin, lisinopril, glimepiride, calcium elemental as carbonate, aspirin, clopidogrel, and metoprolol.  Meds ordered this encounter  Medications  . DISCONTD: calcium elemental as carbonate (BARIATRIC TUMS ULTRA) 400 MG tablet    Sig:   . omega-3 acid ethyl esters (LOVAZA) 1 G capsule    Sig: Take 2 capsules (2 g total) by mouth 2 (two) times daily.    Dispense:  120 capsule    Refill:  11      Follow-up: Return in about 4 months (around 06/01/2015).  Scarlette Calico, MD

## 2015-01-30 NOTE — Progress Notes (Signed)
Pre visit review using our clinic review tool, if applicable. No additional management support is needed unless otherwise documented below in the visit note. 

## 2015-01-31 ENCOUNTER — Encounter: Payer: Self-pay | Admitting: Internal Medicine

## 2015-01-31 LAB — URINALYSIS, ROUTINE W REFLEX MICROSCOPIC
Bilirubin Urine: NEGATIVE
Hgb urine dipstick: NEGATIVE
KETONES UR: NEGATIVE
LEUKOCYTES UA: NEGATIVE
NITRITE: NEGATIVE
RBC / HPF: NONE SEEN (ref 0–?)
SPECIFIC GRAVITY, URINE: 1.025 (ref 1.000–1.030)
Urobilinogen, UA: 0.2 (ref 0.0–1.0)
WBC, UA: NONE SEEN (ref 0–?)
pH: 6 (ref 5.0–8.0)

## 2015-01-31 LAB — MICROALBUMIN / CREATININE URINE RATIO
CREATININE, U: 122.2 mg/dL
MICROALB UR: 7.2 mg/dL — AB (ref 0.0–1.9)
Microalb Creat Ratio: 5.9 mg/g (ref 0.0–30.0)

## 2015-01-31 MED ORDER — OMEGA-3-ACID ETHYL ESTERS 1 G PO CAPS: 2.0000 g | ORAL_CAPSULE | Freq: Two times a day (BID) | ORAL | Status: DC

## 2015-01-31 MED ORDER — INSULIN GLARGINE 300 UNIT/ML ~~LOC~~ SOPN: 50.0000 [IU] | PEN_INJECTOR | Freq: Every day | SUBCUTANEOUS | Status: DC

## 2015-01-31 MED ORDER — INSULIN PEN NEEDLE 32G X 6 MM MISC: 1.0000 | Freq: Every day | Status: DC

## 2015-02-01 ENCOUNTER — Other Ambulatory Visit: Payer: Self-pay | Admitting: Internal Medicine

## 2015-02-01 DIAGNOSIS — E1159 Type 2 diabetes mellitus with other circulatory complications: Secondary | ICD-10-CM

## 2015-02-01 DIAGNOSIS — E781 Pure hyperglyceridemia: Secondary | ICD-10-CM

## 2015-02-01 MED ORDER — INSULIN GLARGINE 300 UNIT/ML ~~LOC~~ SOPN: 50.0000 [IU] | PEN_INJECTOR | Freq: Every day | SUBCUTANEOUS | Status: DC

## 2015-02-01 MED ORDER — INSULIN PEN NEEDLE 32G X 6 MM MISC: 1.0000 | Freq: Every day | Status: AC

## 2015-02-01 MED ORDER — OMEGA-3-ACID ETHYL ESTERS 1 G PO CAPS: 2.0000 g | ORAL_CAPSULE | Freq: Two times a day (BID) | ORAL | Status: DC

## 2015-02-04 ENCOUNTER — Encounter: Payer: Self-pay | Admitting: Internal Medicine

## 2015-02-07 ENCOUNTER — Other Ambulatory Visit: Payer: Self-pay

## 2015-02-07 MED ORDER — GLIMEPIRIDE 1 MG PO TABS: 1.0000 mg | ORAL_TABLET | Freq: Every day | ORAL | Status: DC

## 2015-02-07 MED ORDER — METFORMIN HCL ER 500 MG PO TB24: 1000.0000 mg | ORAL_TABLET | Freq: Two times a day (BID) | ORAL | Status: DC

## 2015-02-07 NOTE — Telephone Encounter (Signed)
A user error has taken place.

## 2015-02-08 ENCOUNTER — Encounter: Payer: Self-pay | Admitting: Internal Medicine

## 2015-02-13 ENCOUNTER — Other Ambulatory Visit: Payer: Self-pay | Admitting: Cardiology

## 2015-02-14 ENCOUNTER — Telehealth: Payer: Self-pay

## 2015-02-14 NOTE — Telephone Encounter (Signed)
PA started and approved  

## 2015-02-16 DIAGNOSIS — M5416 Radiculopathy, lumbar region: Secondary | ICD-10-CM | POA: Diagnosis not present

## 2015-02-21 ENCOUNTER — Encounter: Payer: Self-pay | Admitting: Internal Medicine

## 2015-02-21 ENCOUNTER — Ambulatory Visit (INDEPENDENT_AMBULATORY_CARE_PROVIDER_SITE_OTHER): Payer: Commercial Managed Care - HMO | Admitting: Internal Medicine

## 2015-02-21 VITALS — BP 120/78 | HR 75 | Temp 97.5°F | Resp 16 | Ht 70.0 in | Wt 181.0 lb

## 2015-02-21 DIAGNOSIS — E11621 Type 2 diabetes mellitus with foot ulcer: Secondary | ICD-10-CM | POA: Diagnosis not present

## 2015-02-21 DIAGNOSIS — I70212 Atherosclerosis of native arteries of extremities with intermittent claudication, left leg: Secondary | ICD-10-CM | POA: Diagnosis not present

## 2015-02-21 DIAGNOSIS — I739 Peripheral vascular disease, unspecified: Secondary | ICD-10-CM

## 2015-02-21 DIAGNOSIS — L02419 Cutaneous abscess of limb, unspecified: Secondary | ICD-10-CM

## 2015-02-21 DIAGNOSIS — L03119 Cellulitis of unspecified part of limb: Secondary | ICD-10-CM

## 2015-02-21 DIAGNOSIS — L97523 Non-pressure chronic ulcer of other part of left foot with necrosis of muscle: Secondary | ICD-10-CM

## 2015-02-21 HISTORY — DX: Cutaneous abscess of limb, unspecified: L02.419

## 2015-02-21 HISTORY — DX: Type 2 diabetes mellitus with foot ulcer: E11.621

## 2015-02-21 HISTORY — DX: Type 2 diabetes mellitus with foot ulcer: L97.523

## 2015-02-21 MED ORDER — AMOXICILLIN-POT CLAVULANATE 875-125 MG PO TABS: 1.0000 | ORAL_TABLET | Freq: Two times a day (BID) | ORAL | Status: AC

## 2015-02-21 MED ORDER — AMOXICILLIN-POT CLAVULANATE 875-125 MG PO TABS: 1.0000 | ORAL_TABLET | Freq: Two times a day (BID) | ORAL | Status: DC

## 2015-02-21 NOTE — Patient Instructions (Signed)

## 2015-02-21 NOTE — Progress Notes (Signed)
Subjective:  Patient ID: [name], male    DOB: May 03, 1956  Age: 60 y.o. MRN: [id number]  CC: Foot Swelling   HPI [name] presents for a one week hx of worsening left foot pain and swelling with chills.  Outpatient Prescriptions Prior to Visit  Medication Sig Dispense Refill  . aspirin 81 MG tablet Take 81 mg by mouth daily.    . calcium elemental as carbonate (BARIATRIC TUMS ULTRA) 400 MG tablet     . cilostazol (PLETAL) 100 MG tablet Take 1 tablet (100 mg total) by mouth 2 (two) times daily. 180 tablet 3  . cilostazol (PLETAL) 100 MG tablet TAKE 1 TABLET TWICE DAILY 180 tablet 0  . clopidogrel (PLAVIX) 75 MG tablet Take 1 tablet (75 mg total) by mouth daily. 90 tablet 2  . Diphenhydramine-Acetaminophen (PERCOGESIC PO) Take 2 tablets by mouth 2 (two) times daily as needed. For dental pain    . glimepiride (AMARYL) 1 MG tablet Take 1 tablet (1 mg total) by mouth daily with breakfast. 90 tablet 1  . glucose blood (TRUETEST TEST) test strip Use as instructed to check blood sugar twice daily dx 250.00 200 each 3  . Insulin Glargine (TOUJEO SOLOSTAR) 300 UNIT/ML SOPN Inject 50 Units into the skin daily. 1.5 mL 11  . Insulin Pen Needle (NOVOFINE) 32G X 6 MM MISC 1 Act by Does not apply route daily. Use 1 QD 100 each 3  . isosorbide mononitrate (IMDUR) 30 MG 24 hr tablet Take 1 tablet (30 mg total) by mouth 2 (two) times daily. 180 tablet 3  . lisinopril (PRINIVIL,ZESTRIL) 40 MG tablet Take 1 tablet (40 mg total) by mouth daily. 90 tablet 2  . metFORMIN (GLUCOPHAGE-XR) 500 MG 24 hr tablet Take 2 tablets (1,000 mg total) by mouth 2 (two) times daily. 360 tablet 3  . metoprolol (LOPRESSOR) 50 MG tablet Take 1 tablet (50 mg total) by mouth 2 (two) times daily. 180 tablet 3  . omega-3 acid ethyl esters (LOVAZA) 1 G capsule Take 2 capsules (2 g total) by mouth 2 (two) times daily. 120 capsule 11  . pravastatin (PRAVACHOL) 80 MG tablet Take 1 tablet (80 mg total) by mouth daily. 90  tablet 3  . TRUEPLUS LANCETS 28G MISC 1 each by Does not apply route 2 (two) times daily. Dx 250.00 200 each 3   No facility-administered medications prior to visit.    ROS Review of Systems  Constitutional: Positive for chills. Negative for fever, diaphoresis, activity change, appetite change and fatigue.  HENT: Negative.   Eyes: Negative.   Respiratory: Negative.  Negative for cough, choking, chest tightness, shortness of breath and stridor.   Cardiovascular: Negative.  Negative for chest pain, palpitations and leg swelling.  Gastrointestinal: Negative.  Negative for abdominal pain.  Endocrine: Negative.   Genitourinary: Negative.   Musculoskeletal: Negative.   Skin: Negative.   Allergic/Immunologic: Negative.   Neurological: Negative.  Negative for dizziness, tremors, weakness, light-headedness and numbness.  Hematological: Negative.  Negative for adenopathy. Does not bruise/bleed easily.  Psychiatric/Behavioral: Negative.     Objective:  BP 120/78 mmHg  Pulse 75  Temp(Src) 97.5 F (36.4 C) (Oral)  Ht 5\' 10"  (1.778 m)  Wt 181 lb (82.101 kg)  BMI 25.97 kg/m2  SpO2 97%  BP Readings from Last 3 Encounters:  02/21/15 120/78  01/30/15 140/84  11/08/14 96/71    Wt Readings from Last 3 Encounters:  02/21/15 181 lb (82.101 kg)  01/30/15 192 lb (87.091  kg)  11/07/14 190 lb (86.183 kg)    Physical Exam  Constitutional:  Non-toxic appearance. He does not have a sickly appearance. He does not appear ill. No distress.  HENT:  Head: Normocephalic and atraumatic.  Mouth/Throat: Oropharynx is clear and moist. No oropharyngeal exudate.  Eyes: Conjunctivae are normal. Right eye exhibits no discharge. Left eye exhibits no discharge. No scleral icterus.  Neck: Normal range of motion. Neck supple. No JVD present. No tracheal deviation present. No thyromegaly present.  Cardiovascular: Normal rate, regular rhythm, normal heart sounds and intact distal pulses.  Exam reveals no gallop  and no friction rub.   No murmur heard. Pulmonary/Chest: No stridor.  Musculoskeletal:       Left foot: There is swelling and deformity. There is normal range of motion, no tenderness, no bony tenderness, normal capillary refill, no crepitus and no laceration.       Feet:  Lymphadenopathy:    He has no cervical adenopathy.  Skin: He is not diaphoretic.    Lab Results  Component Value Date   WBC 9.8 01/30/2015   HGB 16.8 01/30/2015   HCT 48.1 01/30/2015   PLT 281.0 01/30/2015   GLUCOSE 268* 01/30/2015   CHOL 228* 01/30/2015   TRIG * 01/30/2015    1324.0 Triglyceride is over 400; calculations on Lipids are invalid.   HDL 30.00* 01/30/2015   LDLDIRECT 71.0 01/30/2015   LDLCALC 35 09/19/2013   ALT 15 01/30/2015   AST 15 01/30/2015   NA 134* 01/30/2015   K 4.3 01/30/2015   CL 98 01/30/2015   CREATININE 1.02 01/30/2015   BUN 12 01/30/2015   CO2 25 01/30/2015   TSH 1.04 01/30/2015   PSA 0.61 10/16/2011   INR 1.0 ratio 06/11/2009   HGBA1C 7.9* 01/30/2015   MICROALBUR 7.2* 01/30/2015    No results found.  Assessment & Plan:   [name] was seen today for foot swelling.  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 If the MRI is + for osteo then refer him for inpt treatment with IV antibiotics and consider surgical intervention -     MR Foot Left W Contrast; Future -     VAS [place] LE ART SEG MULTI (Segm&LE Reynauds); Future -     amoxicillin-clavulanate (AUGMENTIN) 875-125 MG tablet; Take 1 tablet by  mouth 2 (two) times daily.  Cellulitis and abscess of leg, except foot- will treat the infection with augementin -     MR Foot Left W Contrast; Future -     VAS [place] LE ART SEG MULTI (Segm&LE Reynauds); Future -     amoxicillin-clavulanate (AUGMENTIN) 875-125 MG tablet; Take 1 tablet by mouth 2 (two) times daily.  I am having Mr. Gremillion start on amoxicillin-clavulanate. I am also having him maintain his Diphenhydramine-Acetaminophen (PERCOGESIC PO), glucose blood, TRUEPLUS LANCETS 28G, cilostazol, isosorbide mononitrate, pravastatin, lisinopril, calcium elemental as carbonate, aspirin, clopidogrel, metoprolol, Insulin Glargine, Insulin Pen Needle, omega-3 acid ethyl esters, glimepiride, metFORMIN, and cilostazol.  Meds ordered this encounter  Medications  . amoxicillin-clavulanate (AUGMENTIN) 875-125 MG tablet    Sig: Take 1 tablet by mouth 2 (two) times daily.    Dispense:  42 tablet    Refill:  0     Follow-up: Return in about 2 weeks (around 03/07/2015).  Scarlette Calico, MD

## 2015-02-21 NOTE — Progress Notes (Signed)
Pre visit review using our clinic review tool, if applicable. No additional management support is needed unless otherwise documented below in the visit note. 

## 2015-03-02 ENCOUNTER — Ambulatory Visit
Admission: RE | Admit: 2015-03-02 | Discharge: 2015-03-02 | Disposition: A | Discharge status: Home / Self Care | Payer: Commercial Managed Care - HMO | Source: Ambulatory Visit | Attending: Internal Medicine | Admitting: Internal Medicine

## 2015-03-02 DIAGNOSIS — L03119 Cellulitis of unspecified part of limb: Secondary | ICD-10-CM

## 2015-03-02 DIAGNOSIS — L02419 Cutaneous abscess of limb, unspecified: Secondary | ICD-10-CM

## 2015-03-02 DIAGNOSIS — L97523 Non-pressure chronic ulcer of other part of left foot with necrosis of muscle: Principal | ICD-10-CM

## 2015-03-02 DIAGNOSIS — E11621 Type 2 diabetes mellitus with foot ulcer: Secondary | ICD-10-CM

## 2015-03-07 ENCOUNTER — Other Ambulatory Visit: Payer: Self-pay | Admitting: Internal Medicine

## 2015-03-07 ENCOUNTER — Telehealth: Payer: Self-pay | Admitting: Internal Medicine

## 2015-03-07 DIAGNOSIS — L97523 Non-pressure chronic ulcer of other part of left foot with necrosis of muscle: Principal | ICD-10-CM

## 2015-03-07 DIAGNOSIS — E11621 Type 2 diabetes mellitus with foot ulcer: Secondary | ICD-10-CM

## 2015-03-07 NOTE — Telephone Encounter (Signed)
Will you ask him to come here for a plain xray of the foot and to follow up with me soon?

## 2015-03-07 NOTE — Telephone Encounter (Signed)
Spoke w/pt. He was instructed to come here for xray at his earliest convenience and schedule f/u appt with Dr. Ronnald Ramp. Pt understood.

## 2015-03-07 NOTE — Telephone Encounter (Signed)
I called [facility] to follow up on patient's MRI and spoke w/Arena. Per [name], patient was unable to complete the MRI on his foot. The MRI technician who scanned the patient [name]) stated the patient was in a great amount of pain, so much so that he was shaking. She stated that even if the patient had been able to tolerate the position longer, the images would not have been clear due to the shaking. Per protocol, patient was instructed to call his physician. [name] stated she did tell the patient this, however, due to the amount of pain he was experiencing it may be possible that he did not understand her. Feel free to call [facility] @ [phone] with any additional questions.

## 2015-03-15 ENCOUNTER — Ambulatory Visit (INDEPENDENT_AMBULATORY_CARE_PROVIDER_SITE_OTHER)
Admission: RE | Admit: 2015-03-15 | Discharge: 2015-03-15 | Disposition: A | Discharge status: Home / Self Care | Payer: Commercial Managed Care - HMO | Source: Ambulatory Visit | Attending: Internal Medicine | Admitting: Internal Medicine

## 2015-03-15 ENCOUNTER — Telehealth: Payer: Self-pay | Admitting: Internal Medicine

## 2015-03-15 ENCOUNTER — Encounter: Payer: Self-pay | Admitting: Internal Medicine

## 2015-03-15 DIAGNOSIS — L97523 Non-pressure chronic ulcer of other part of left foot with necrosis of muscle: Secondary | ICD-10-CM

## 2015-03-15 DIAGNOSIS — E11621 Type 2 diabetes mellitus with foot ulcer: Secondary | ICD-10-CM | POA: Diagnosis not present

## 2015-03-15 DIAGNOSIS — L97529 Non-pressure chronic ulcer of other part of left foot with unspecified severity: Secondary | ICD-10-CM | POA: Diagnosis not present

## 2015-03-15 NOTE — Telephone Encounter (Signed)
Pt were advise to go to the ER due to x-ray result. Per pt, he said it was about  2 hours wait at the [facility] ER (does not want to wait), so he is going to [facility] ER tomorrow.

## 2015-03-16 ENCOUNTER — Inpatient Hospital Stay (HOSPITAL_COMMUNITY): Admission: RE | Admit: 2015-03-16 | Payer: Self-pay | Source: Ambulatory Visit

## 2015-03-16 ENCOUNTER — Inpatient Hospital Stay (HOSPITAL_COMMUNITY)
Admit: 2015-03-16 | Discharge: 2015-03-16 | Disposition: A | Discharge status: Home / Self Care | Payer: Commercial Managed Care - HMO | Attending: Emergency Medicine | Admitting: Emergency Medicine

## 2015-03-16 ENCOUNTER — Inpatient Hospital Stay (HOSPITAL_COMMUNITY)
Admission: EM | Admit: 2015-03-16 | Discharge: 2015-03-17 | DRG: 540 | Disposition: A | Discharge status: Home / Self Care | Payer: Commercial Managed Care - HMO | Attending: Internal Medicine | Admitting: Internal Medicine

## 2015-03-16 ENCOUNTER — Encounter (HOSPITAL_COMMUNITY): Payer: Self-pay | Admitting: Emergency Medicine

## 2015-03-16 DIAGNOSIS — Z8249 Family history of ischemic heart disease and other diseases of the circulatory system: Secondary | ICD-10-CM

## 2015-03-16 DIAGNOSIS — Z888 Allergy status to other drugs, medicaments and biological substances status: Secondary | ICD-10-CM

## 2015-03-16 DIAGNOSIS — I10 Essential (primary) hypertension: Secondary | ICD-10-CM | POA: Diagnosis present

## 2015-03-16 DIAGNOSIS — E1169 Type 2 diabetes mellitus with other specified complication: Secondary | ICD-10-CM | POA: Diagnosis not present

## 2015-03-16 DIAGNOSIS — Z87891 Personal history of nicotine dependence: Secondary | ICD-10-CM

## 2015-03-16 DIAGNOSIS — I252 Old myocardial infarction: Secondary | ICD-10-CM | POA: Diagnosis not present

## 2015-03-16 DIAGNOSIS — M79673 Pain in unspecified foot: Secondary | ICD-10-CM

## 2015-03-16 DIAGNOSIS — I739 Peripheral vascular disease, unspecified: Secondary | ICD-10-CM | POA: Diagnosis not present

## 2015-03-16 DIAGNOSIS — Z794 Long term (current) use of insulin: Secondary | ICD-10-CM

## 2015-03-16 DIAGNOSIS — IMO0002 Reserved for concepts with insufficient information to code with codable children: Secondary | ICD-10-CM

## 2015-03-16 DIAGNOSIS — Z8572 Personal history of non-Hodgkin lymphomas: Secondary | ICD-10-CM

## 2015-03-16 DIAGNOSIS — Z7902 Long term (current) use of antithrombotics/antiplatelets: Secondary | ICD-10-CM

## 2015-03-16 DIAGNOSIS — Z7984 Long term (current) use of oral hypoglycemic drugs: Secondary | ICD-10-CM

## 2015-03-16 DIAGNOSIS — M009 Pyogenic arthritis, unspecified: Secondary | ICD-10-CM | POA: Diagnosis present

## 2015-03-16 DIAGNOSIS — M1990 Unspecified osteoarthritis, unspecified site: Secondary | ICD-10-CM | POA: Diagnosis present

## 2015-03-16 DIAGNOSIS — Z7982 Long term (current) use of aspirin: Secondary | ICD-10-CM

## 2015-03-16 DIAGNOSIS — I2510 Atherosclerotic heart disease of native coronary artery without angina pectoris: Secondary | ICD-10-CM | POA: Diagnosis present

## 2015-03-16 DIAGNOSIS — E1159 Type 2 diabetes mellitus with other circulatory complications: Secondary | ICD-10-CM

## 2015-03-16 DIAGNOSIS — E1151 Type 2 diabetes mellitus with diabetic peripheral angiopathy without gangrene: Secondary | ICD-10-CM | POA: Diagnosis present

## 2015-03-16 DIAGNOSIS — E11621 Type 2 diabetes mellitus with foot ulcer: Secondary | ICD-10-CM | POA: Diagnosis present

## 2015-03-16 DIAGNOSIS — Z79899 Other long term (current) drug therapy: Secondary | ICD-10-CM

## 2015-03-16 DIAGNOSIS — E785 Hyperlipidemia, unspecified: Secondary | ICD-10-CM | POA: Diagnosis not present

## 2015-03-16 DIAGNOSIS — M86172 Other acute osteomyelitis, left ankle and foot: Secondary | ICD-10-CM | POA: Diagnosis not present

## 2015-03-16 DIAGNOSIS — E1165 Type 2 diabetes mellitus with hyperglycemia: Secondary | ICD-10-CM

## 2015-03-16 DIAGNOSIS — M79672 Pain in left foot: Secondary | ICD-10-CM | POA: Diagnosis not present

## 2015-03-16 DIAGNOSIS — I70245 Atherosclerosis of native arteries of left leg with ulceration of other part of foot: Secondary | ICD-10-CM | POA: Diagnosis not present

## 2015-03-16 DIAGNOSIS — E781 Pure hyperglyceridemia: Secondary | ICD-10-CM

## 2015-03-16 DIAGNOSIS — L97529 Non-pressure chronic ulcer of other part of left foot with unspecified severity: Secondary | ICD-10-CM | POA: Diagnosis not present

## 2015-03-16 HISTORY — DX: Pyogenic arthritis, unspecified: M00.9

## 2015-03-16 HISTORY — DX: Reserved for concepts with insufficient information to code with codable children: IMO0002

## 2015-03-16 HISTORY — DX: Type 2 diabetes mellitus with other specified complication: E11.69

## 2015-03-16 HISTORY — DX: Type 2 diabetes mellitus with diabetic peripheral angiopathy without gangrene: E11.51

## 2015-03-16 HISTORY — DX: Other acute osteomyelitis, left ankle and foot: M86.172

## 2015-03-16 LAB — BASIC METABOLIC PANEL
ANION GAP: 8 (ref 5–15)
BUN: 13 mg/dL (ref 6–20)
CHLORIDE: 103 mmol/L (ref 101–111)
CO2: 26 mmol/L (ref 22–32)
Calcium: 9.5 mg/dL (ref 8.9–10.3)
Creatinine, Ser: 1.03 mg/dL (ref 0.61–1.24)
Glucose, Bld: 127 mg/dL — ABNORMAL HIGH (ref 65–99)
POTASSIUM: 4.7 mmol/L (ref 3.5–5.1)
SODIUM: 137 mmol/L (ref 135–145)

## 2015-03-16 LAB — CBC WITH DIFFERENTIAL/PLATELET
BASOS ABS: 0 10*3/uL (ref 0.0–0.1)
Basophils Relative: 0 %
Eosinophils Absolute: 0.5 10*3/uL (ref 0.0–0.7)
Eosinophils Relative: 6 %
HEMATOCRIT: 41 % (ref 39.0–52.0)
HEMOGLOBIN: 14.1 g/dL (ref 13.0–17.0)
LYMPHS ABS: 2.5 10*3/uL (ref 0.7–4.0)
LYMPHS PCT: 28 %
MCH: 33.3 pg (ref 26.0–34.0)
MCHC: 34.4 g/dL (ref 30.0–36.0)
MCV: 96.7 fL (ref 78.0–100.0)
Monocytes Absolute: 0.7 10*3/uL (ref 0.1–1.0)
Monocytes Relative: 8 %
NEUTROS ABS: 5 10*3/uL (ref 1.7–7.7)
NEUTROS PCT: 58 %
PLATELETS: 238 10*3/uL (ref 150–400)
RBC: 4.24 MIL/uL (ref 4.22–5.81)
RDW: 14.2 % (ref 11.5–15.5)
WBC: 8.7 10*3/uL (ref 4.0–10.5)

## 2015-03-16 LAB — GLUCOSE, CAPILLARY
Glucose-Capillary: 97 mg/dL (ref 65–99)
Glucose-Capillary: 116 mg/dL — ABNORMAL HIGH (ref 65–99)

## 2015-03-16 LAB — CBG MONITORING, ED: Glucose-Capillary: 82 mg/dL (ref 65–99)

## 2015-03-16 MED ORDER — PIPERACILLIN-TAZOBACTAM 3.375 G IVPB 30 MIN
3.3750 g | Freq: Once | INTRAVENOUS | Status: AC
  Administered 2015-03-16: 3.375 g via INTRAVENOUS
  Filled 2015-03-16: qty 50

## 2015-03-16 MED ORDER — METOPROLOL TARTRATE 50 MG PO TABS
50.0000 mg | ORAL_TABLET | Freq: Two times a day (BID) | ORAL | Status: DC
  Administered 2015-03-16: 50 mg via ORAL
  Filled 2015-03-16 (×3): qty 1

## 2015-03-16 MED ORDER — VANCOMYCIN HCL IN DEXTROSE 1-5 GM/200ML-% IV SOLN
1000.0000 mg | Freq: Once | INTRAVENOUS | Status: DC
  Filled 2015-03-16: qty 200

## 2015-03-16 MED ORDER — PIPERACILLIN-TAZOBACTAM 3.375 G IVPB
3.3750 g | Freq: Three times a day (TID) | INTRAVENOUS | Status: DC
  Administered 2015-03-16 – 2015-03-17 (×2): 3.375 g via INTRAVENOUS
  Filled 2015-03-16 (×3): qty 50

## 2015-03-16 MED ORDER — ONDANSETRON HCL 4 MG/2ML IJ SOLN: 4.0000 mg | Freq: Four times a day (QID) | INTRAMUSCULAR | Status: DC | PRN

## 2015-03-16 MED ORDER — ACETAMINOPHEN 325 MG PO TABS: 650.0000 mg | ORAL_TABLET | Freq: Four times a day (QID) | ORAL | Status: DC | PRN

## 2015-03-16 MED ORDER — ASPIRIN EC 81 MG PO TBEC
81.0000 mg | DELAYED_RELEASE_TABLET | Freq: Every day | ORAL | Status: DC
  Filled 2015-03-16: qty 1

## 2015-03-16 MED ORDER — GLIMEPIRIDE 1 MG PO TABS
1.0000 mg | ORAL_TABLET | Freq: Every day | ORAL | Status: DC
  Administered 2015-03-17: 1 mg via ORAL
  Filled 2015-03-16 (×2): qty 1

## 2015-03-16 MED ORDER — HYDROCODONE-ACETAMINOPHEN 5-325 MG PO TABS: 1.0000 | ORAL_TABLET | ORAL | Status: DC | PRN

## 2015-03-16 MED ORDER — INSULIN GLARGINE 100 UNIT/ML ~~LOC~~ SOLN
50.0000 [IU] | Freq: Every day | SUBCUTANEOUS | Status: DC
  Filled 2015-03-16 (×2): qty 0.5

## 2015-03-16 MED ORDER — HYDRALAZINE HCL 20 MG/ML IJ SOLN
5.0000 mg | Freq: Four times a day (QID) | INTRAMUSCULAR | Status: DC | PRN
  Administered 2015-03-16: 5 mg via INTRAVENOUS
  Filled 2015-03-16: qty 1

## 2015-03-16 MED ORDER — VANCOMYCIN HCL IN DEXTROSE 1-5 GM/200ML-% IV SOLN
1000.0000 mg | Freq: Three times a day (TID) | INTRAVENOUS | Status: DC
  Administered 2015-03-16 – 2015-03-17 (×2): 1000 mg via INTRAVENOUS
  Filled 2015-03-16 (×3): qty 200

## 2015-03-16 MED ORDER — INSULIN ASPART 100 UNIT/ML ~~LOC~~ SOLN: 0.0000 [IU] | Freq: Three times a day (TID) | SUBCUTANEOUS | Status: DC

## 2015-03-16 MED ORDER — METFORMIN HCL ER 500 MG PO TB24
1000.0000 mg | ORAL_TABLET | Freq: Two times a day (BID) | ORAL | Status: DC
  Administered 2015-03-16: 1000 mg via ORAL
  Filled 2015-03-16 (×2): qty 2

## 2015-03-16 MED ORDER — SODIUM CHLORIDE 0.9 % IV SOLN
INTRAVENOUS | Status: DC
  Administered 2015-03-16: 16:00:00 via INTRAVENOUS

## 2015-03-16 MED ORDER — PRAVASTATIN SODIUM 80 MG PO TABS
80.0000 mg | ORAL_TABLET | Freq: Every day | ORAL | Status: DC
  Administered 2015-03-16: 80 mg via ORAL
  Filled 2015-03-16 (×2): qty 1

## 2015-03-16 MED ORDER — OMEGA-3-ACID ETHYL ESTERS 1 G PO CAPS
1.0000 g | ORAL_CAPSULE | Freq: Every day | ORAL | Status: DC
  Filled 2015-03-16: qty 1

## 2015-03-16 MED ORDER — CILOSTAZOL 100 MG PO TABS
100.0000 mg | ORAL_TABLET | Freq: Two times a day (BID) | ORAL | Status: DC
  Administered 2015-03-16: 100 mg via ORAL
  Filled 2015-03-16 (×3): qty 1

## 2015-03-16 MED ORDER — ONDANSETRON HCL 4 MG PO TABS: 4.0000 mg | ORAL_TABLET | Freq: Four times a day (QID) | ORAL | Status: DC | PRN

## 2015-03-16 MED ORDER — ACETAMINOPHEN 650 MG RE SUPP: 650.0000 mg | Freq: Four times a day (QID) | RECTAL | Status: DC | PRN

## 2015-03-16 MED ORDER — HYDROMORPHONE HCL 1 MG/ML IJ SOLN: 1.0000 mg | INTRAMUSCULAR | Status: DC | PRN

## 2015-03-16 MED ORDER — LISINOPRIL 40 MG PO TABS
40.0000 mg | ORAL_TABLET | Freq: Every day | ORAL | Status: DC
  Filled 2015-03-16: qty 1

## 2015-03-16 MED ORDER — CLOPIDOGREL BISULFATE 75 MG PO TABS
75.0000 mg | ORAL_TABLET | Freq: Every day | ORAL | Status: DC
  Filled 2015-03-16 (×2): qty 1

## 2015-03-16 MED ORDER — ISOSORBIDE MONONITRATE ER 30 MG PO TB24
30.0000 mg | ORAL_TABLET | Freq: Two times a day (BID) | ORAL | Status: DC
  Administered 2015-03-16: 30 mg via ORAL
  Filled 2015-03-16 (×3): qty 1

## 2015-03-16 MED ORDER — INSULIN ASPART 100 UNIT/ML ~~LOC~~ SOLN: 0.0000 [IU] | Freq: Every day | SUBCUTANEOUS | Status: DC

## 2015-03-16 NOTE — Progress Notes (Signed)
ANTIBIOTIC CONSULT NOTE - INITIAL  Pharmacy Consult for Vancomycin & Zosyn Indication: osteomyelitis  Allergies  Allergen Reactions  . Actos [Pioglitazone]     Rash and edema   . Fenofibrate Other (See Comments)    Muscle weakness/cramping/burning  . Chlorhexidine Gluconate Rash  . Orange Oil Rash  . Soap Other (See Comments)    States that most soaps make him break out/rash-he uses a sensitive skin soap and shampoos as well, has to use specific allergen free soap and shampoo    Patient Measurements: Height: 5\' 9"  (175.3 cm) Weight: 188 lb 15 oz (85.7 kg) IBW/kg (Calculated) : 70.7  Vital Signs: Temp: 98.3 F (36.8 C) (11/11 1517) Temp Source: Oral (11/11 1517) BP: 163/82 mmHg (11/11 1703) Pulse Rate: 77 (11/11 1517) Intake/Output from previous day:   Intake/Output from this shift:    Labs:  Recent Labs  03/16/15 0947  WBC 8.7  HGB 14.1  PLT 238  CREATININE 1.03   Estimated Creatinine Clearance: 82.7 mL/min (by C-G formula based on Cr of 1.03). No results for input(s): VANCOTROUGH, VANCOPEAK, VANCORANDOM, GENTTROUGH, GENTPEAK, GENTRANDOM, TOBRATROUGH, TOBRAPEAK, TOBRARND, AMIKACINPEAK, AMIKACINTROU, AMIKACIN in the last 72 hours.   Microbiology: No results found for this or any previous visit (from the past 720 hour(s)).  Medical History: Past Medical History  Diagnosis Date  . Atherosclerosis of native arteries of the extremities with rest pain   . Coronary atherosclerosis of native coronary artery   . PAD (peripheral artery disease) ([place])   . DM type 2 (diabetes mellitus, type 2) ([place])   . Other and unspecified hyperlipidemia   . Unspecified essential hypertension   . Tobacco use disorder   . H/O alcohol abuse     quit 2011 - dry since that time  . H/O: obesity     lost 100lbs  . Non Hodgkin's lymphoma ([place]) 2000    tx with XRT at [facility], in remission  . Myocardial infarction [facility]) June 9,2009  . Osteoarthritis     right hip  . History of  herniated intervertebral disc   . Osteoarthritis     Medications:  Scheduled:  . [START ON 03/17/2015] aspirin EC  81 mg Oral Daily  . cilostazol  100 mg Oral BID  . clopidogrel  75 mg Oral Daily  . [START ON 03/17/2015] glimepiride  1 mg Oral Q breakfast  . insulin aspart  0-15 Units Subcutaneous TID WC  . insulin aspart  0-5 Units Subcutaneous QHS  . insulin glargine  50 Units Subcutaneous Q1200  . isosorbide mononitrate  30 mg Oral BID  . [START ON 03/17/2015] lisinopril  40 mg Oral Daily  . metFORMIN  1,000 mg Oral BID WC  . metoprolol  50 mg Oral BID  . [START ON 03/17/2015] omega-3 acid ethyl esters  1 g Oral Daily  . piperacillin-tazobactam (ZOSYN)  IV  3.375 g Intravenous Q8H  . pravastatin  80 mg Oral Daily  . vancomycin  1,000 mg Intravenous Q8H   Assessment: 59yo M who has completed course of Augmentin for foot ulcer.  Outpatient xray showed progression to osteomyelitis so he was sent to the ED for IV antibiotics.  Noted vascular surgery recommendation for left 5th ray amputation.    He has been afebrile since admission (although reports fever at home) with normal WBC.  Renal function at patient's baseline.   Pharmacy asked to initiate Vancomycin and Zosyn empirically for osteomyelitis.   11/11 >> Zosyn  >> 11/11>> Vanc >>  10/20>>Augmentin>>11/11  No micro data.  Dose changes/levels:  Goal of Therapy:  Vancomycin trough level 15-20 mcg/ml  Plan:  Zosyn 3.375gm IV Q8h to be infused over 4hrs Vancomycin 1gm IV q8h Check Vancomycin trough at steady state Monitor renal function and cx data   Biagio Borg 03/16/2015,7:09 PM

## 2015-03-16 NOTE — ED Notes (Signed)
Family at bedside. 

## 2015-03-16 NOTE — H&P (Signed)
[facility] History and Physical  [name] [id number] DOB: June 06, 1955 DOA: 03/16/2015  Referring physician: ER physician: Dr. Quintella Reichert  PCP: Scarlette Calico, MD  Chief Complaint:   HPI:  59 year old man with history of diabetes, peripheral arterial disease, dyslipidemia, left lateral foot ulcer, BLE intermittent claudication followed by Dr. Trula Slade. Patient presented to [facility] for evaluation of the findings on x-ray done 03/15/2015. He had x-ray of the left foot which showed findings consistent with osteomyelitis involving the head of the fifth metatarsal and the base of the proximal phalanx of the fifth toe. The MTP joint space was not abnormally widened but septic arthritis is likely present as well. Patient reported some sensitivity in the left foot and sharp pain, constant, nonradiating, 5 out of 10 in intensity worse with movement. Pain was somewhat relieved with analgesia given in ED. No reports of fevers at home. No other complaints such as chest pain, palpitations or shortness of breath. No abdominal pain, nausea or vomiting. No blood in the stool or urine.  In ED, blood pressure was 163/84, heart rate 80, RR 16, Tmax 98.58F.  blood work was essentially unremarkable. He was given dose of Zosyn in ED. Vascular surgery was consulted, Dr. Bridgett Larsson will see the patient in consultation.    Assessment & Plan    Active Problems: Acute osteomyelitis ([place]) / Septic arthritis of left foot, MTP joint ([place]) - X-ray done 03/15/2015 with findings significant for osteomyelitis as well as possible septic arthritis - We started vancomycin and Zosyn. - Appreciate vascular surgery recommendations - Continue pain management efforts  PAD (peripheral artery disease) ([place]) - Will continue aspirin, Plavix, cilostazol  Diabetes mellitus type 2, uncontrolled with peripheral arterial disease, with long-term insulin use (HCC) - Last A1c in September 2016 was 7.9, slightly above  the goal number for diabetic patients - Resume Lantus 50 units twice daily, sliding scale insulin - Continue Amaryl - Would hold metformin considering acute infectious process  Dyslipidemia associated with type 2 diabetes mellitus (HCC) - Continue omega-3 supplementation and Pravachol 80 mg daily  Essential hypertension - Continue lisinopril, metoprolol, Imdur    DVT prophylaxis:  - SCD's bilaterally   Radiological Exams on Admission: Dg Foot Complete Left 03/15/2015  Findings consistent with osteomyelitis involving the head of the fifth metatarsal and the base of the proximal phalanx of the fifth toe. The MTP joint space is not abnormally widened, but septic arthritis is likely present as well. Electronically Signed   By: David  Martinique M.D.   On: 03/15/2015 11:15    Code Status: Full Family Communication: Plan of care discussed with the patient and his wife at the bedside  Disposition Plan: Admit for further evaluation, medical floor   Leisa Lenz, MD  [facility] Pager [phone]  Time spent in minutes: 75 minutes  Review of Systems:  Constitutional: Negative for fever, chills and malaise/fatigue. Negative for diaphoresis.  HENT: Negative for hearing loss, ear pain, nosebleeds, congestion, sore throat, neck pain, tinnitus and ear discharge.   Eyes: Negative for blurred vision, double vision, photophobia, pain, discharge and redness.  Respiratory: Negative for cough, hemoptysis, sputum production, shortness of breath, wheezing and stridor.   Cardiovascular: Negative for chest pain, palpitations, orthopnea, claudication and leg swelling.  Gastrointestinal: Negative for nausea, vomiting and abdominal pain. Negative for heartburn, constipation, blood in stool and melena.  Genitourinary: Negative for dysuria, urgency, frequency, hematuria and flank pain.  Musculoskeletal: Negative for myalgias, back pain, joint pain and falls.  Skin:  Per HPI,  Neurological: Negative for  dizziness and weakness. Negative for tingling, tremors, sensory change, speech change, focal weakness, loss of consciousness and headaches.  Endo/Heme/Allergies: Negative for environmental allergies and polydipsia. Does not bruise/bleed easily.  Psychiatric/Behavioral: Negative for suicidal ideas. The patient is not nervous/anxious.      Past Medical History  Diagnosis Date  . Atherosclerosis of native arteries of the extremities with rest pain   . Coronary atherosclerosis of native coronary artery   . PAD (peripheral artery disease) ([place])   . DM type 2 (diabetes mellitus, type 2) ([place])   . Other and unspecified hyperlipidemia   . Unspecified essential hypertension   . Tobacco use disorder   . H/O alcohol abuse     quit 2011 - dry since that time  . H/O: obesity     lost 100lbs  . Non Hodgkin's lymphoma ([place]) 2000    tx with XRT at [facility], in remission  . Myocardial infarction [facility]) June 9,2009  . Osteoarthritis     right hip  . History of herniated intervertebral disc   . Osteoarthritis    Past Surgical History  Procedure Laterality Date  . Cardiac catheterization      2011  . Leg surgery      on bone above ankle-not sure which bone left leg 1981  . Multiple extractions with alveoloplasty  10/31/2011    Procedure: MULTIPLE EXTRACION WITH ALVEOLOPLASTY;  Surgeon: Lenn Cal, DDS;  Location: [place];  Service: Oral Surgery;  Laterality: N/A;  Extraction of tooth #'s 2, 4,5,6,7,8,9,10,11,12,13,15,21,22,23,24,25,26,27,28, 29, and 31 with alveoloplasty  . Leg surgery  1981    Bone cyst- Left leg   Social History:  reports that he has quit smoking. His smoking use included Cigarettes. He has a 21.5 pack-year smoking history. He has never used smokeless tobacco. He reports that he does not drink alcohol or use illicit drugs.  Allergies  Allergen Reactions  . Actos [Pioglitazone]     Rash and edema   . Fenofibrate Other (See Comments)    Muscle  weakness/cramping/burning  . Chlorhexidine Gluconate Rash  . Orange Oil Rash  . Soap Other (See Comments)    States that most soaps make him break out/rash-he uses a sensitive skin soap and shampoos as well, has to use specific allergen free soap and shampoo    Family History:  Family History  Problem Relation Age of Onset  . Heart attack Mother     87's  . Heart disease Mother     Heart disease before age 60  . Hypertension Mother   . Hyperlipidemia Mother   . Peripheral Artery Disease Father   . Hypertension Father   . Hyperlipidemia Father   . Peripheral vascular disease Father   . Heart attack      40's/40's     Prior to Admission medications   Medication Sig Start Date End Date Taking? Authorizing Provider  aspirin 81 MG tablet Take 81 mg by mouth daily.   Yes Historical Provider, MD  calcium elemental as carbonate (BARIATRIC TUMS ULTRA) 400 MG tablet Chew 1,000 mg by mouth 2 (two) times daily as needed for heartburn.  05/24/14  Yes Historical Provider, MD  cilostazol (PLETAL) 100 MG tablet Take 1 tablet (100 mg total) by mouth 2 (two) times daily. 02/20/14  Yes Rowe Clack, MD  glimepiride (AMARYL) 1 MG tablet Take 1 tablet (1 mg total) by mouth daily with breakfast. 02/07/15  Yes Janith Lima, MD  glucose blood (TRUETEST TEST) test strip Use as instructed to check blood sugar twice daily dx 250.00 02/04/12  Yes Rowe Clack, MD  Insulin Glargine (TOUJEO SOLOSTAR) 300 UNIT/ML SOPN Inject 50 Units into the skin daily. 02/01/15  Yes Janith Lima, MD  isosorbide mononitrate (IMDUR) 30 MG 24 hr tablet Take 1 tablet (30 mg total) by mouth 2 (two) times daily. 02/20/14  Yes Rowe Clack, MD  lisinopril (PRINIVIL,ZESTRIL) 40 MG tablet Take 1 tablet (40 mg total) by mouth daily. 06/30/14  Yes Rowe Clack, MD  metFORMIN (GLUCOPHAGE-XR) 500 MG 24 hr tablet Take 2 tablets (1,000 mg total) by mouth 2 (two) times daily. 02/07/15  Yes Janith Lima, MD  metoprolol  (LOPRESSOR) 50 MG tablet Take 1 tablet (50 mg total) by mouth 2 (two) times daily. 11/09/14  Yes Rowe Clack, MD  Omega-3 Fatty Acids (FISH OIL PO) Take 1,200 mg by mouth daily.   Yes Historical Provider, MD  pravastatin (PRAVACHOL) 80 MG tablet Take 1 tablet (80 mg total) by mouth daily. 02/20/14  Yes Rowe Clack, MD  TRUEPLUS LANCETS 28G MISC 1 each by Does not apply route 2 (two) times daily. Dx 250.00 02/04/12  Yes Rowe Clack, MD  amoxicillin-clavulanate (AUGMENTIN) 875-125 MG tablet Take 1 tablet by mouth 2 (two) times daily.    Historical Provider, MD  clopidogrel (PLAVIX) 75 MG tablet Take 1 tablet (75 mg total) by mouth daily. 10/11/14   Rowe Clack, MD  Insulin Pen Needle (NOVOFINE) 32G X 6 MM MISC 1 Act by Does not apply route daily. Use 1 QD 02/01/15   Janith Lima, MD  omega-3 acid ethyl esters (LOVAZA) 1 G capsule Take 2 capsules (2 g total) by mouth 2 (two) times daily. Patient not taking: Reported on 03/16/2015 02/01/15   Janith Lima, MD   Physical Exam: Filed Vitals:   03/16/15 0921 03/16/15 1148 03/16/15 1207  BP: 165/85 163/84   Pulse: 80 78   Temp: 97.8 F (36.6 C)  98 F (36.7 C)  TempSrc: Oral  Oral  Resp: 16 16   SpO2: 98% 94%     Physical Exam  Constitutional: Appears well-developed and well-nourished. No distress.  HENT: Normocephalic. No tonsillar erythema or exudates Eyes: Conjunctivae are normal. No scleral icterus.  Neck: Normal ROM. Neck supple. No JVD. No tracheal deviation. No thyromegaly.  CVS: RRR, S1/S2 +, no murmurs, no gallops, no carotid bruit.  Pulmonary: Effort and breath sounds normal, no stridor, rhonchi, wheezes, rales.  Abdominal: Soft. BS +,  no distension, tenderness, rebound or guarding.  Musculoskeletal: Normal range of motion. No edema and no tenderness.  Lymphadenopathy: No lymphadenopathy noted, cervical, inguinal. Neuro: Alert. Normal reflexes, muscle tone coordination. No focal neurologic deficits. Skin:  left foot ulcer with dressing in place, right side foot pulses palpable  Psychiatric: Normal mood and affect. Behavior, judgment, thought content normal.   Labs on Admission:  Basic Metabolic Panel:  Recent Labs Lab 03/16/15 0947  NA 137  K 4.7  CL 103  CO2 26  GLUCOSE 127*  BUN 13  CREATININE 1.03  CALCIUM 9.5   Liver Function Tests: No results for input(s): AST, ALT, ALKPHOS, BILITOT, PROT, ALBUMIN in the last 168 hours. No results for input(s): LIPASE, AMYLASE in the last 168 hours. No results for input(s): AMMONIA in the last 168 hours. CBC:  Recent Labs Lab 03/16/15 0947  WBC 8.7  NEUTROABS 5.0  HGB 14.1  HCT 41.0  MCV 96.7  PLT 238   Cardiac Enzymes: No results for input(s): CKTOTAL, CKMB, CKMBINDEX, TROPONINI in the last 168 hours. BNP: Invalid input(s): POCBNP CBG:  Recent Labs Lab 03/16/15 1309  GLUCAP 82    If 7PM-7AM, please contact night-coverage [email or web address] Password [email or web address] 03/16/2015, 2:08 PM

## 2015-03-16 NOTE — Progress Notes (Signed)
Notified physician that patient does not have orders for CBG accuchecks and is diabetic; as well, patient BP 177/84 and no BP med scheduled or PRN.  Physician putting in orders.

## 2015-03-16 NOTE — Consult Note (Addendum)
Referred by:  [facility]  Reason for referral: Left foot ulcers  History of Present Illness  [name] is a 59 y.o. (11-01-1955) male known BLE intermittent claudication followed by Dr. Trula Slade who presents with chief complaint: L foot ulcer.  Patient notes onset of the ulcers a few months ago.  He reported had an X-ray yesterday and was told to come back to the ED for admission for IV abx.  The patient notes some hyperesthesia in bottom of his L foot but anesthesia in much of his L foot.  Pain is described as sharp, severity 5-10/10, and associated with manipulation of L foot.  Patient has attempted to treat this pain with rest.  The patient has no rest pain symptoms also.  He denies any drainage from the ulcer but does note a low grade temperature overnight. The patient notes a prior ulcer in his right foot healed spontaneously.  Atherosclerotic risk factors include: DM, smoking.  Past Medical History  Diagnosis Date  . Atherosclerosis of native arteries of the extremities with rest pain   . Coronary atherosclerosis of native coronary artery   . PAD (peripheral artery disease) ([place])   . DM type 2 (diabetes mellitus, type 2) ([place])   . Other and unspecified hyperlipidemia   . Unspecified essential hypertension   . Tobacco use disorder   . H/O alcohol abuse     quit 2011 - dry since that time  . H/O: obesity     lost 100lbs  . Non Hodgkin's lymphoma ([place]) 2000    tx with XRT at [facility], in remission  . Myocardial infarction [facility]) June 9,2009  . Osteoarthritis     right hip  . History of herniated intervertebral disc   . Osteoarthritis     Past Surgical History  Procedure Laterality Date  . Cardiac catheterization      2011  . Leg surgery      on bone above ankle-not sure which bone left leg 1981  . Multiple extractions with alveoloplasty  10/31/2011    Procedure: MULTIPLE EXTRACION WITH ALVEOLOPLASTY;  Surgeon: Lenn Cal, DDS;  Location: [place];   Service: Oral Surgery;  Laterality: N/A;  Extraction of tooth #'s 2, 4,5,6,7,8,9,10,11,12,13,15,21,22,23,24,25,26,27,28, 29, and 31 with alveoloplasty  . Leg surgery  1981    Bone cyst- Left leg    Social History   Social History  . Marital Status: Married    Spouse Name: N/A  . Number of Children: N/A  . Years of Education: N/A   Occupational History  .      Disabled. Former Biomedical scientist.   Social History Main Topics  . Smoking status: Former Smoker -- 0.50 packs/day for 43 years    Types: Cigarettes  . Smokeless tobacco: Never Used     Comment: heavy 2nd hand exposue ongoing  . Alcohol Use: No     Comment: former heavy user quit 2011  . Drug Use: No  . Sexual Activity: Not on file   Other Topics Concern  . Not on file   Social History Narrative   The patient is married. The patient has no children.   Patient with a history of smoking one pack per day for 43 years. Patient started smoking at the age of 86.   Patient no longer drinks alcohol. Patient quit proximally one and a half years ago from a previously heavy alcohol use.    Family History  Problem Relation Age of Onset  . Heart  attack Mother     14's  . Heart disease Mother     Heart disease before age 52  . Hypertension Mother   . Hyperlipidemia Mother   . Peripheral Artery Disease Father   . Hypertension Father   . Hyperlipidemia Father   . Peripheral vascular disease Father   . Heart attack      40's/40's    Current Facility-Administered Medications  Medication Dose Route Frequency Provider Last Rate Last Dose  . 0.9 %  sodium chloride infusion   Intravenous Continuous Robbie Lis, MD      . acetaminophen (TYLENOL) tablet 650 mg  650 mg Oral Q6H PRN Robbie Lis, MD       Or  . acetaminophen (TYLENOL) suppository 650 mg  650 mg Rectal Q6H PRN Robbie Lis, MD      . aspirin tablet 81 mg  81 mg Oral Daily Robbie Lis, MD      . cilostazol (PLETAL) tablet 100 mg  100 mg Oral BID Robbie Lis, MD        . clopidogrel (PLAVIX) tablet 75 mg  75 mg Oral Daily Robbie Lis, MD      . Derrill Memo ON 03/17/2015] glimepiride (AMARYL) tablet 1 mg  1 mg Oral Q breakfast Robbie Lis, MD      . insulin glargine (LANTUS) injection 50 Units  50 Units Subcutaneous Q1200 Robbie Lis, MD      . isosorbide mononitrate (IMDUR) 24 hr tablet 30 mg  30 mg Oral BID Robbie Lis, MD      . lisinopril (PRINIVIL,ZESTRIL) tablet 40 mg  40 mg Oral Daily Robbie Lis, MD      . metFORMIN (GLUCOPHAGE-XR) 24 hr tablet 1,000 mg  1,000 mg Oral BID WC Robbie Lis, MD      . metoprolol (LOPRESSOR) tablet 50 mg  50 mg Oral BID Robbie Lis, MD      . Derrill Memo ON 03/17/2015] omega-3 acid ethyl esters (LOVAZA) capsule 1 g  1 g Oral Daily Robbie Lis, MD      . ondansetron [facility]) tablet 4 mg  4 mg Oral Q6H PRN Robbie Lis, MD       Or  . ondansetron [facility]) injection 4 mg  4 mg Intravenous Q6H PRN Robbie Lis, MD      . pravastatin (PRAVACHOL) tablet 80 mg  80 mg Oral Daily Robbie Lis, MD        Allergies  Allergen Reactions  . Actos [Pioglitazone]     Rash and edema   . Fenofibrate Other (See Comments)    Muscle weakness/cramping/burning  . Chlorhexidine Gluconate Rash  . Orange Oil Rash  . Soap Other (See Comments)    States that most soaps make him break out/rash-he uses a sensitive skin soap and shampoos as well, has to use specific allergen free soap and shampoo     REVIEW OF SYSTEMS:  (Positives checked otherwise negative)  CARDIOVASCULAR:   [ ]  chest pain,  [ ]  chest pressure,  [ ]  palpitations,  [ ]  shortness of breath when laying flat,  [ ]  shortness of breath with exertion,   [x]  pain in feet when walking,  [ ]  pain in feet when laying flat, [ ]  history of blood clot in veins (DVT),  [ ]  history of phlebitis,  [ ]  swelling in legs,  [ ]  varicose veins  PULMONARY:   [ ]  productive  cough,  [ ]  asthma,  [ ]  wheezing  NEUROLOGIC:   [ ]  weakness in arms or legs,  [ ]  numbness in  arms or legs,  [ ]  difficulty speaking or slurred speech,  [ ]  temporary loss of vision in one eye,  [ ]  dizziness  HEMATOLOGIC:   [ ]  bleeding problems,  [ ]  problems with blood clotting too easily  MUSCULOSKEL:   [ ]  joint pain, [ ]  joint swelling  GASTROINTEST:   [ ]  vomiting blood,  [ ]  blood in stool     GENITOURINARY:   [ ]  burning with urination,  [ ]  blood in urine  PSYCHIATRIC:   [ ]  history of major depression  INTEGUMENTARY:   [ ]  rashes,  [x]  ulcers  CONSTITUTIONAL:   [ ]  fever,  [ ]  chills   For VQI Use Only  PRE-ADM LIVING: Home  AMB STATUS: Ambulatory  CAD Sx: History of MI, but no symptoms No MI within 6 months  PRIOR CHF: None  STRESS TEST: [x]  No, [ ]  Normal, [ ]  + ischemia, [ ]  + MI, [ ]  Both   Physical Examination  Filed Vitals:   03/16/15 0921 03/16/15 1148 03/16/15 1207 03/16/15 1517  BP: 165/85 163/84  177/84  Pulse: 80 78  77  Temp: 97.8 F (36.6 C)  98 F (36.7 C) 98.3 F (36.8 C)  TempSrc: Oral  Oral Oral  Resp: 16 16  18   Height:    5\' 9"  (1.753 m)  Weight:    188 lb 15 oz (85.7 kg)  SpO2: 98% 94%  98%   Body mass index is 27.89 kg/(m^2).  General: A&O x 3, WD, WN  Head: [place]/AT  Ear/Nose/Throat: Hearing grossly intact, nares w/o erythema or drainage, oropharynx w/o Erythema/Exudate, Mallampati score: 3  Eyes: PERRLA, EOMI  Neck: Supple, no nuchal rigidity, no palpable LAD  Pulmonary: Sym exp, good air movt, CTAB, no rales, rhonchi, & wheezing  Cardiac: RRR, Nl S1, S2, no Murmurs, rubs or gallops  Vascular: Vessel Right Left  Radial Palpable Palpable  Brachial Palpable Palpable  Carotid Palpable, without bruit Palpable, without bruit  Aorta Not palpable N/A  Femoral Palpable Palpable  Popliteal Not palpable Not palpable  PT Not Palpable Not Palpable  DP Not Palpable Not Palpable   Gastrointestinal: soft, NTND, -G/R, - HSM, - masses, - CVAT B  Musculoskeletal: M/S 5/5 throughout with intact DF/PF,  Extremities without ischemic changes, L lateral 5th MT ulcer, no drainage, some necrotic tissue in ulcer, no erythema in fore foot  Neurologic: CN 2-12 intact , Pain and light touch intact in extremities except anesthesia from L knee down, Motor exam as listed above  Psychiatric: Judgment intact, Mood & affect appropriate for pt's clinical situation  Dermatologic: See M/S exam for extremity exam, no rashes otherwise noted  Lymph : No Cervical, Axillary, or Inguinal lymphadenopathy   Laboratory: CBC:    Component Value Date/Time   WBC 8.7 03/16/2015 0947   RBC 4.24 03/16/2015 0947   HGB 14.1 03/16/2015 0947   HCT 41.0 03/16/2015 0947   PLT 238 03/16/2015 0947   MCV 96.7 03/16/2015 0947   MCH 33.3 03/16/2015 0947   MCHC 34.4 03/16/2015 0947   RDW 14.2 03/16/2015 0947   LYMPHSABS 2.5 03/16/2015 0947   MONOABS 0.7 03/16/2015 0947   EOSABS 0.5 03/16/2015 0947   BASOSABS 0.0 03/16/2015 0947    BMP:    Component Value Date/Time   NA 137 03/16/2015 0947  K 4.7 03/16/2015 0947   CL 103 03/16/2015 0947   CO2 26 03/16/2015 0947   GLUCOSE 127* 03/16/2015 0947   BUN 13 03/16/2015 0947   CREATININE 1.03 03/16/2015 0947   CALCIUM 9.5 03/16/2015 0947   GFRNONAA >60 03/16/2015 0947   GFRAA >60 03/16/2015 0947    Coagulation: Lab Results  Component Value Date   INR 1.0 ratio 06/11/2009   INR 0.9 10/13/2007   No results found for: PTT  Lipids:    Component Value Date/Time   CHOL 228* 01/30/2015 1018   TRIG * 01/30/2015 1018    1324.0 Triglyceride is over 400; calculations on Lipids are invalid.   HDL 30.00* 01/30/2015 1018   CHOLHDL 8 01/30/2015 1018   VLDL 269.4* 02/20/2014 1103   LDLCALC 35 09/19/2013 0829   LDLDIRECT 71.0 01/30/2015 1018   Radiology: Dg Foot Complete Left  03/15/2015  CLINICAL DATA:  Diabetic foot ulcer on the bottom of the foot near the fifth toe, chronically open wound EXAM: LEFT FOOT - COMPLETE 3+ VIEW COMPARISON:  Left foot series of November 08, 2014 FINDINGS: Since the previous study there has developed ostial ice is of the base of the proximal phalanx of the great toe and of the head of the fifth metatarsal. The joint space does not appear abnormally widened. More distally the phalanges of the left fifth toe are unremarkable. The first through fourth toes as well as first through fourth metatarsals exhibit no acute abnormalities. There is mild soft tissue swelling over the fifth toe with evidence of a cutaneous wound. IMPRESSION: Findings consistent with osteomyelitis involving the head of the fifth metatarsal and the base of the proximal phalanx of the fifth toe. The MTP joint space is not abnormally widened, but septic arthritis is likely present as well. Electronically Signed   By: David  Martinique M.D.   On: 03/15/2015 11:15    Non-Invasive Vascular Imaging  ABI (Date: 03/16/2015) ordered   Medical Decision Making  [name] is a 59 y.o. male who presents with: LLE critical limb ischemia,, known moderate RLE PAD, uncontrolled DM, L 5th MT osteomyelitis    I doubt this patient is at risk for acute limb loss.  He has intact motor with no evidence of threatened limb in the left side.  His anesthesia in that L foot is long lasting and likely related to his DM.  He will likely need a L 5th ray toe amputation for the osteomyelitis in L foot but optimization of blood flow in that foot would help him heal the amputation.  Will obtain BLE ABI with TBI to set this patient's baseline preoperatively.  If his TBI is normal on the L, he may be able to heal without further revascularization.  I doubt he will have a normal TBI.  Will arrange for: Aortogram, bilateral leg runoff, and possible left leg intervention with Dr. Trula Slade for this coming Tuesday.  I discussed in depth with the patient the nature of atherosclerosis, and emphasized the importance of maximal medical management including strict control of blood pressure, blood glucose,  and lipid levels, antiplatelet agents, obtaining regular exercise, and cessation of smoking.  The patient is aware that without maximal medical management the underlying atherosclerotic disease process will progress, limiting the benefit of any interventions. The patient is currently on a statin:  Pravachol. The patient is currently on an anti-platelet: Plavix. Continue abx per IM. Will continue to follow this patient with you.  Thank you for allowing [place]  to participate in this patient's care.   [name], MD [facility] Office: [phone] Pager: [phone]  03/16/2015, 3:31 PM    Addendum    RIGHT    LEFT    PRESSURE WAVEFORM  PRESSURE WAVEFORM  BRACHIAL 186 Tri BRACHIAL 177 Tri  DP   DP    AT 99 Mono AT 60 Damp mono  PT 104 Damp mono PT 80 Damp mono  PER   PER    GREAT TOE  NA GREAT TOE  NA    RIGHT LEFT  ABI 0.56 0.43        ABI demonstrate some mild decrease B consistent with moderate to severe disease.  Unfortunately, TBI not done.  Would proceed with angiographic imaging on Tuesday with Dr. Trula Slade to determine revascularization options.   [name], MD [facility] [place] Office: [phone] Pager: [phone]  03/16/2015, 3:52 PM

## 2015-03-16 NOTE — ED Notes (Signed)
Pt reports infection on bottom of L foot for past 4 months. Had imaging done yesterday and was told to come here for IV abx. Hx of DM and PAD

## 2015-03-16 NOTE — ED Provider Notes (Signed)
CSN: [id number]     Arrival date & time 03/16/15  0915 History   First MD Initiated Contact with Patient 03/16/15 [phone]     Chief Complaint  Patient presents with  . Foot Ulcer     The history is provided by the patient. No language interpreter was used.   [name] is a 59 year old man with history of diabetes, peripheral arterial disease here for evaluation of foot ulcer. He has a ulcer on his left lateral foot that is been present for the last 3 months. He currently completed a 21 day course of amoxicillin. He presents today because he had an outpatient x-ray performed yesterday that demonstrated osteomyelitis and he was told to present to the emergency department for IV antibiotics. He reports a temperature of 100 at home last night. He has pain in the foot with walking as well as swelling and redness to the foot and occasional drainage from the ulcer. Symptoms are moderate, constant, worsening.  Past Medical History  Diagnosis Date  . Atherosclerosis of native arteries of the extremities with rest pain   . Coronary atherosclerosis of native coronary artery   . PAD (peripheral artery disease) ([place])   . DM type 2 (diabetes mellitus, type 2) ([place])   . Other and unspecified hyperlipidemia   . Unspecified essential hypertension   . Tobacco use disorder   . H/O alcohol abuse     quit 2011 - dry since that time  . H/O: obesity     lost 100lbs  . Non Hodgkin's lymphoma ([place]) 2000    tx with XRT at [facility], in remission  . Myocardial infarction [facility]) June 9,2009  . Osteoarthritis     right hip  . History of herniated intervertebral disc   . Osteoarthritis    Past Surgical History  Procedure Laterality Date  . Cardiac catheterization      2011  . Leg surgery      on bone above ankle-not sure which bone left leg 1981  . Multiple extractions with alveoloplasty  10/31/2011    Procedure: MULTIPLE EXTRACION WITH ALVEOLOPLASTY;  Surgeon: Lenn Cal, DDS;  Location: [place];   Service: Oral Surgery;  Laterality: N/A;  Extraction of tooth #'s 2, 4,5,6,7,8,9,10,11,12,13,15,21,22,23,24,25,26,27,28, 29, and 31 with alveoloplasty  . Leg surgery  1981    Bone cyst- Left leg   Family History  Problem Relation Age of Onset  . Heart attack Mother     49's  . Heart disease Mother     Heart disease before age 66  . Hypertension Mother   . Hyperlipidemia Mother   . Peripheral Artery Disease Father   . Hypertension Father   . Hyperlipidemia Father   . Peripheral vascular disease Father   . Heart attack      40's/40's   Social History  Substance Use Topics  . Smoking status: Former Smoker -- 0.50 packs/day for 43 years    Types: Cigarettes  . Smokeless tobacco: Never Used     Comment: heavy 2nd hand exposue ongoing  . Alcohol Use: No     Comment: former heavy user quit 2011    Review of Systems  All other systems reviewed and are negative.     Allergies  Actos; Fenofibrate; Chlorhexidine gluconate; Orange oil; and Soap  Home Medications   Prior to Admission medications   Medication Sig Start Date End Date Taking? Authorizing Provider  aspirin 81 MG tablet Take 81 mg by mouth daily.    Historical  Provider, MD  calcium elemental as carbonate (BARIATRIC TUMS ULTRA) 400 MG tablet  05/24/14   Historical Provider, MD  cilostazol (PLETAL) 100 MG tablet Take 1 tablet (100 mg total) by mouth 2 (two) times daily. 02/20/14   Rowe Clack, MD  cilostazol (PLETAL) 100 MG tablet TAKE 1 TABLET TWICE DAILY 02/13/15   Larey Dresser, MD  clopidogrel (PLAVIX) 75 MG tablet Take 1 tablet (75 mg total) by mouth daily. 10/11/14   Rowe Clack, MD  Diphenhydramine-Acetaminophen (PERCOGESIC PO) Take 2 tablets by mouth 2 (two) times daily as needed. For dental pain    Historical Provider, MD  glimepiride (AMARYL) 1 MG tablet Take 1 tablet (1 mg total) by mouth daily with breakfast. 02/07/15   Janith Lima, MD  glucose blood (TRUETEST TEST) test strip Use as  instructed to check blood sugar twice daily dx 250.00 02/04/12   Rowe Clack, MD  Insulin Glargine (TOUJEO SOLOSTAR) 300 UNIT/ML SOPN Inject 50 Units into the skin daily. 02/01/15   Janith Lima, MD  Insulin Pen Needle (NOVOFINE) 32G X 6 MM MISC 1 Act by Does not apply route daily. Use 1 QD 02/01/15   Janith Lima, MD  isosorbide mononitrate (IMDUR) 30 MG 24 hr tablet Take 1 tablet (30 mg total) by mouth 2 (two) times daily. 02/20/14   Rowe Clack, MD  lisinopril (PRINIVIL,ZESTRIL) 40 MG tablet Take 1 tablet (40 mg total) by mouth daily. 06/30/14   Rowe Clack, MD  metFORMIN (GLUCOPHAGE-XR) 500 MG 24 hr tablet Take 2 tablets (1,000 mg total) by mouth 2 (two) times daily. 02/07/15   Janith Lima, MD  metoprolol (LOPRESSOR) 50 MG tablet Take 1 tablet (50 mg total) by mouth 2 (two) times daily. 11/09/14   Rowe Clack, MD  omega-3 acid ethyl esters (LOVAZA) 1 G capsule Take 2 capsules (2 g total) by mouth 2 (two) times daily. 02/01/15   Janith Lima, MD  pravastatin (PRAVACHOL) 80 MG tablet Take 1 tablet (80 mg total) by mouth daily. 02/20/14   Rowe Clack, MD  TRUEPLUS LANCETS 28G MISC 1 each by Does not apply route 2 (two) times daily. Dx 250.00 02/04/12   Rowe Clack, MD   BP 165/85 mmHg  Pulse 80  Temp(Src) 97.8 F (36.6 C) (Oral)  Resp 16  SpO2 98% Physical Exam  Constitutional: He is oriented to person, place, and time. He appears well-developed and well-nourished.  HENT:  Head: Normocephalic and atraumatic.  Cardiovascular: Normal rate and regular rhythm.   No murmur heard. Pulmonary/Chest: Effort normal and breath sounds normal. No respiratory distress.  Abdominal: Soft. There is no tenderness. There is no rebound and no guarding.  Musculoskeletal:  Mild erythema and edema of the left foot with ulceration to the lateral foot at the base of the fifth digit. Ulcer is approximately 1-1-1/2 cm in diameter with no active exudates.  Neurological: He  is alert and oriented to person, place, and time.  Skin: Skin is warm and dry.  Psychiatric: He has a normal mood and affect. His behavior is normal.  Nursing note and vitals reviewed.   ED Course  Procedures (including critical care time) Labs Review Labs Reviewed  BASIC METABOLIC PANEL - Abnormal; Notable for the following:    Glucose, Bld 127 (*)    All other components within normal limits  CBC WITH DIFFERENTIAL/PLATELET    Imaging Review Dg Foot Complete Left  03/15/2015  CLINICAL DATA:  Diabetic foot ulcer on the bottom of the foot near the fifth toe, chronically open wound EXAM: LEFT FOOT - COMPLETE 3+ VIEW COMPARISON:  Left foot series of November 08, 2014 FINDINGS: Since the previous study there has developed ostial ice is of the base of the proximal phalanx of the great toe and of the head of the fifth metatarsal. The joint space does not appear abnormally widened. More distally the phalanges of the left fifth toe are unremarkable. The first through fourth toes as well as first through fourth metatarsals exhibit no acute abnormalities. There is mild soft tissue swelling over the fifth toe with evidence of a cutaneous wound. IMPRESSION: Findings consistent with osteomyelitis involving the head of the fifth metatarsal and the base of the proximal phalanx of the fifth toe. The MTP joint space is not abnormally widened, but septic arthritis is likely present as well. Electronically Signed   By: David  Martinique M.D.   On: 03/15/2015 11:15   I have personally reviewed and evaluated these images and lab results as part of my medical decision-making.   EKG Interpretation None      MDM   Final diagnoses:  Foot pain   Pt with hx/o DM, PVD here with progressive foot ulcer, outpatient imaging c/w osteomyelitis.  Providing IV abx.  Hospitalist consulted.  Dr. Bridgett Larsson with Vascular Surgery consulted, he will see the patient in consult.    Quintella Reichert, MD 03/16/15 [phone]

## 2015-03-16 NOTE — ED Notes (Signed)
Zosyn infusing at this time

## 2015-03-16 NOTE — Progress Notes (Signed)
VASCULAR LAB PRELIMINARY  ARTERIAL  ABI completed:    RIGHT    LEFT    PRESSURE WAVEFORM  PRESSURE WAVEFORM  BRACHIAL 186 Tri BRACHIAL 177 Tri  DP   DP    AT 99 Mono AT 60 Damp mono  PT 104 Damp mono PT 80 Damp mono  PER   PER    GREAT TOE  NA GREAT TOE  NA    RIGHT LEFT  ABI 0.56 0.43     Landry Mellow, RDMS, RVT  03/16/2015, 3:39 PM

## 2015-03-17 LAB — COMPREHENSIVE METABOLIC PANEL
ALBUMIN: 3.7 g/dL (ref 3.5–5.0)
ALK PHOS: 46 U/L (ref 38–126)
ALT: 14 U/L — AB (ref 17–63)
AST: 16 U/L (ref 15–41)
Anion gap: 8 (ref 5–15)
BUN: 11 mg/dL (ref 6–20)
CALCIUM: 9.1 mg/dL (ref 8.9–10.3)
CO2: 23 mmol/L (ref 22–32)
CREATININE: 0.97 mg/dL (ref 0.61–1.24)
Chloride: 106 mmol/L (ref 101–111)
GFR calc Af Amer: >60 mL/min (ref >60)
GFR calc non Af Amer: >60 mL/min (ref >60)
GLUCOSE: 132 mg/dL — AB (ref 65–99)
Potassium: 4.3 mmol/L (ref 3.5–5.1)
SODIUM: 137 mmol/L (ref 135–145)
Total Bilirubin: 0.7 mg/dL (ref 0.3–1.2)
Total Protein: 6.6 g/dL (ref 6.5–8.1)

## 2015-03-17 LAB — CBC
HCT: 39.1 % (ref 39.0–52.0)
Hemoglobin: 13.5 g/dL (ref 13.0–17.0)
MCH: 33.1 pg (ref 26.0–34.0)
MCHC: 34.5 g/dL (ref 30.0–36.0)
MCV: 95.8 fL (ref 78.0–100.0)
PLATELETS: 237 10*3/uL (ref 150–400)
RBC: 4.08 MIL/uL — ABNORMAL LOW (ref 4.22–5.81)
RDW: 14.2 % (ref 11.5–15.5)
WBC: 8.6 10*3/uL (ref 4.0–10.5)

## 2015-03-17 LAB — TSH: TSH: 1.024 u[IU]/mL (ref 0.350–4.500)

## 2015-03-17 LAB — GLUCOSE, CAPILLARY: GLUCOSE-CAPILLARY: 116 mg/dL — AB (ref 65–99)

## 2015-03-17 MED ORDER — INSULIN GLARGINE 300 UNIT/ML ~~LOC~~ SOPN: 55.0000 [IU] | PEN_INJECTOR | Freq: Every day | SUBCUTANEOUS | Status: DC

## 2015-03-17 MED ORDER — CLINDAMYCIN HCL 300 MG PO CAPS: 300.0000 mg | ORAL_CAPSULE | Freq: Three times a day (TID) | ORAL | Status: DC

## 2015-03-17 NOTE — Discharge Summary (Signed)
Physician Discharge Summary  [name] [id number] DOB: July 05, 1955 DOA: 03/16/2015  PCP: Scarlette Calico, MD  Admit date: 03/16/2015 Discharge date: 03/17/2015  Recommendations for Outpatient Follow-up:  We recommend you stop metformin considering acute infection, osteomyelitis and possible septic arthritis. We have recommended to increase her insulin to 55 units daily. Continue clindamycin as prescribed and please follow-up with your primary care physician in about 2 weeks to make sure foot ulcer is stable.  Discharge Diagnoses:  Principal Problem:   Acute osteomyelitis of left foot ([place]) Active Problems:   Septic arthritis of left foot ([facility])   Essential hypertension   PAD (peripheral artery disease) ([place])   Uncontrolled diabetes mellitus type 2 with peripheral artery disease ([place])   Dyslipidemia associated with type 2 diabetes mellitus ([place])    Discharge Condition: stable; patient insists on going home today. He has declined to have surgical intervention which was recommended by vascular surgery.  Diet recommendation: as tolerated   History of present illness:  59 year old man with history of diabetes, peripheral arterial disease, dyslipidemia, left lateral foot ulcer, BLE intermittent claudication followed by Dr. Trula Slade. Patient presented to [facility] for evaluation of the findings on x-ray done 03/15/2015. He had x-ray of the left foot which showed findings consistent with osteomyelitis involving the head of the fifth metatarsal and the base of the proximal phalanx of the fifth toe. The MTP joint space was not abnormally widened but septic arthritis is likely present as well.  Patient reported left foot sharp pain, constant, nonradiating, 5 out of 10 in intensity worse with movement. Pain was somewhat relieved with analgesia given in ED. No reports of fevers at home.    In ED, blood pressure was 163/84, heart rate 80, RR 16, Tmax 98.29F. blood work was  essentially unremarkable. He was given dose of Zosyn in ED. Vascular surgery was consulted, Dr. Bridgett Larsson will see the patient in consultation.   Hospital Course:   Assessment & Plan    Active Problems: Acute osteomyelitis ([place]) / Septic arthritis of left foot, MTP joint ([place]) - X-ray done 03/15/2015 with findings significant for osteomyelitis as well as possible septic arthritis - We started vancomycin and Zosyn. patient insists on going home today. He has declined surgical intervention recommended by vascular surgery which was amputation. - We'll prescribe clindamycin for 2 weeks on discharge   PAD (peripheral artery disease) ([place]) - Continue aspirin, Plavix, cilostazol  Diabetes mellitus type 2, uncontrolled with peripheral arterial disease, with long-term insulin use ([facility]) - Last A1c in September 2016 was 7.9, slightly above the goal number for diabetic patients - Continue Amaryl - Continue to hold metformin on discharge because of osteomyelitis - Increase Lantus to 55 units daily  Dyslipidemia associated with type 2 diabetes mellitus ([facility]) - Continue omega-3 supplementation and Pravachol 80 mg daily on discharge   Essential hypertension - Continue lisinopril, metoprolol, Imdur on discharge    DVT prophylaxis:  - SCD's bilaterally in hospital  Radiological Exams on Admission: Dg Foot Complete Left 03/15/2015 Findings consistent with osteomyelitis involving the head of the fifth metatarsal and the base of the proximal phalanx of the fifth toe. The MTP joint space is not abnormally widened, but septic arthritis is likely present as well. Electronically Signed By: [name] M.D. On: 03/15/2015 11:15    Code Status: Full Family Communication: Plan of care discussed with the patient      Signed:  Leisa Lenz, MD  [facility] 03/17/2015, 7:58 AM  Pager #: [phone]  Time spent in minutes: less than 30 minutes   Discharge Exam: Filed Vitals:    03/17/15 0521  BP: 143/77  Pulse: 86  Temp: 97.8 F (36.6 C)  Resp: 18   Filed Vitals:   03/16/15 1517 03/16/15 1703 03/16/15 2133 03/17/15 0521  BP: 177/84 163/82 153/84 143/77  Pulse: 77  87 86  Temp: 98.3 F (36.8 C)  98.7 F (37.1 C) 97.8 F (36.6 C)  TempSrc: Oral  Oral Oral  Resp: 18  18 18   Height: 5\' 9"  (1.753 m)     Weight: 85.7 kg (188 lb 15 oz)  88.1 kg (194 lb 3.6 oz)   SpO2: 98%  96% 100%    General: Pt is alert, follows commands appropriately, not in acute distress Cardiovascular: Regular rate and rhythm, S1/S2 +, no murmurs Respiratory: Clear to auscultation bilaterally, no wheezing, no crackles, no rhonchi Abdominal: Soft, non tender, non distended, bowel sounds +, no guarding Extremities: Left lateral 5th MT ulcer with no drainage.  Neuro: Grossly nonfocal  Discharge Instructions  Discharge Instructions    Call MD for:  difficulty breathing, headache or visual disturbances    Complete by:  As directed      Call MD for:  persistant nausea and vomiting    Complete by:  As directed      Call MD for:  severe uncontrolled pain    Complete by:  As directed      Diet - low sodium heart healthy    Complete by:  As directed      Discharge instructions    Complete by:  As directed   We recommend you stop metformin considering acute infection, osteomyelitis and possible septic arthritis. We have recommended to increase her insulin to 55 units daily. Continue clindamycin as prescribed and please follow-up with your primary care physician in about 2 weeks to make sure foot ulcer is stable.     Increase activity slowly    Complete by:  As directed             Medication List    STOP taking these medications        amoxicillin-clavulanate 875-125 MG tablet  Commonly known as:  AUGMENTIN     metFORMIN 500 MG 24 hr tablet  Commonly known as:  GLUCOPHAGE-XR      TAKE these medications        aspirin 81 MG tablet  Take 81 mg by mouth daily.     calcium  elemental as carbonate 400 MG chewable tablet  Commonly known as:  BARIATRIC TUMS ULTRA  Chew 1,000 mg by mouth 2 (two) times daily as needed for heartburn.     cilostazol 100 MG tablet  Commonly known as:  PLETAL  Take 1 tablet (100 mg total) by mouth 2 (two) times daily.     clindamycin 300 MG capsule  Commonly known as:  CLEOCIN  Take 1 capsule (300 mg total) by mouth 3 (three) times daily.     clopidogrel 75 MG tablet  Commonly known as:  PLAVIX  Take 1 tablet (75 mg total) by mouth daily.     FISH OIL PO  Take 1,200 mg by mouth daily.     glimepiride 1 MG tablet  Commonly known as:  AMARYL  Take 1 tablet (1 mg total) by mouth daily with breakfast.     glucose blood test strip  Commonly known as:  TRUETEST TEST  Use as instructed to check blood sugar twice daily  dx 250.00     Insulin Glargine 300 UNIT/ML Sopn  Commonly known as:  TOUJEO SOLOSTAR  Inject 55 Units into the skin daily.     Insulin Pen Needle 32G X 6 MM Misc  Commonly known as:  NOVOFINE  1 Act by Does not apply route daily. Use 1 QD     isosorbide mononitrate 30 MG 24 hr tablet  Commonly known as:  IMDUR  Take 1 tablet (30 mg total) by mouth 2 (two) times daily.     lisinopril 40 MG tablet  Commonly known as:  PRINIVIL,ZESTRIL  Take 1 tablet (40 mg total) by mouth daily.     metoprolol 50 MG tablet  Commonly known as:  LOPRESSOR  Take 1 tablet (50 mg total) by mouth 2 (two) times daily.     omega-3 acid ethyl esters 1 G capsule  Commonly known as:  LOVAZA  Take 2 capsules (2 g total) by mouth 2 (two) times daily.     pravastatin 80 MG tablet  Commonly known as:  PRAVACHOL  Take 1 tablet (80 mg total) by mouth daily.     TRUEPLUS LANCETS 28G Misc  1 each by Does not apply route 2 (two) times daily. Dx 250.00           Follow-up Information    Follow up with Scarlette Calico, MD. Schedule an appointment as soon as possible for a visit in 2 weeks.   Specialty:  Internal Medicine   Why:   Follow up appt after recent hospitalization   Contact information:   [address] [place] [address] [phone]        The results of significant diagnostics from this hospitalization (including imaging, microbiology, ancillary and laboratory) are listed below for reference.    Significant Diagnostic Studies: Dg Foot Complete Left  03/15/2015  CLINICAL DATA:  Diabetic foot ulcer on the bottom of the foot near the fifth toe, chronically open wound EXAM: LEFT FOOT - COMPLETE 3+ VIEW COMPARISON:  Left foot series of November 08, 2014 FINDINGS: Since the previous study there has developed ostial ice is of the base of the proximal phalanx of the great toe and of the head of the fifth metatarsal. The joint space does not appear abnormally widened. More distally the phalanges of the left fifth toe are unremarkable. The first through fourth toes as well as first through fourth metatarsals exhibit no acute abnormalities. There is mild soft tissue swelling over the fifth toe with evidence of a cutaneous wound. IMPRESSION: Findings consistent with osteomyelitis involving the head of the fifth metatarsal and the base of the proximal phalanx of the fifth toe. The MTP joint space is not abnormally widened, but septic arthritis is likely present as well. Electronically Signed   By: Randall  Marshall M.D.   On: 03/15/2015 11:15    Microbiology: No results found for this or any previous visit (from the past 240 hour(s)).   Labs: Basic Metabolic Panel:  Recent Labs Lab 03/16/15 0947 03/17/15 0500  NA 137 137  K 4.7 4.3  CL 103 106  CO2 26 23  GLUCOSE 127* 132*  BUN 13 11  CREATININE 1.03 0.97  CALCIUM 9.5 9.1   Liver Function Tests:  Recent Labs Lab 03/17/15 0500  AST 16  ALT 14*  ALKPHOS 46  BILITOT 0.7  PROT 6.6  ALBUMIN 3.7   No results for input(s): LIPASE, AMYLASE in the last 168 hours. No results for input(s): AMMONIA in  the last 168 hours. CBC:  Recent Labs Lab  03/16/15 0947 03/17/15 0500  WBC 8.7 8.6  NEUTROABS 5.0  --   HGB 14.1 13.5  HCT 41.0 39.1  MCV 96.7 95.8  PLT 238 237   Cardiac Enzymes: No results for input(s): CKTOTAL, CKMB, CKMBINDEX, TROPONINI in the last 168 hours. BNP: BNP (last 3 results) No results for input(s): BNP in the last 8760 hours.  ProBNP (last 3 results) No results for input(s): PROBNP in the last 8760 hours.  CBG:  Recent Labs Lab 03/16/15 1309 03/16/15 1616 03/16/15 2121 03/17/15 0743  GLUCAP 82 97 116* 116*

## 2015-03-17 NOTE — Discharge Instructions (Signed)
Bone and Joint Infections, Adult °Bone infections (osteomyelitis) and joint infections (septic arthritis) occur when bacteria or other germs get inside a bone or a joint. This can happen if you have an infection in another part of your body that spreads through your blood. Germs from your skin or from outside of your body can also cause this type of infection if you have a wound or a broken bone (fracture) that breaks the skin. °Anyone can get a bone infection or joint infection. You may be more likely to get this type of infection if you have a condition, such as diabetes, that lowers your ability to fight infection or increases your chances of getting an infection. Bone and joint infections can cause damage, and they can spread to other areas of your body. They need to be treated quickly. °CAUSES °Most bone and joint infections are caused by bacteria. They can also be caused by other germs, such as viruses and funguses. °RISK FACTORS °This condition is more likely to develop in: °· People who recently had surgery, especially bone or joint surgery. °· People who have a long-term (chronic) disease, such as: °¨ HIV (human immunodeficiency virus). °¨ Diabetes. °¨ Rheumatoid arthritis. °¨ Sickle cell anemia. °· Elderly people. °· People who take medicines that block or weaken the body's defense system (immune system). °· People who have a condition that reduces their blood flow. °· People who are on kidney dialysis. °· People who have an artificial joint. °· People who have had a joint or bone repaired with plates or screws (surgical hardware). °· People who use or abuse IV drugs. °· People who have had trauma, such as stepping on a nail. °SYMPTOMS °Symptoms vary depending on the type and location of your infection. Common symptoms of bone and joint infections include: °· Fever and chills. °· Redness and warmth. °· Swelling. °· Pain and stiffness. °· Drainage of fluid or pus near the infection. °· Weight loss and  fatigue. °· Decreased ability to use a hand or foot. °DIAGNOSIS °This condition may be diagnosed based on symptoms, medical history, a physical exam, and diagnostic tests. Tests can help to identify the cause of the infection. You may have various tests, such as: °· A sample of tissue, fluid, or blood taken to be examined under a microscope. °· A procedure to remove fluid from the infected joint with a needle (joint aspiration) for testing in a lab. °· Pus or discharge swabbed from a wound for testing to identify germs and to determine what type of medicine will kill them (culture and sensitivity). °· Blood tests to look for evidence of infection and inflammation (biomarkers). °· Imaging studies to determine how severe the bone or joint infection is. These may include: °¨ X-rays. °¨ CT scan. °¨ MRI. °¨ Bone scan. °TREATMENT °Treatment depends on the cause and type of infection. Antibiotic medicines are usually the first treatment for a bone or joint infection. Treatment with antibiotics may include: °· Getting IV antibiotics. This may be done in a hospital at first. You may have to continue IV antibiotics at home for several weeks. You may also have to take antibiotics by mouth for several weeks after that. °· Taking more than one kind of antibiotic. Treatment may start with a type of antibiotic that works against many different bacteria (broad spectrum antibiotics). IV antibiotics may be changed if tests show that another type may work better. °Other treatments may include: °· Draining fluid from the joint by placing a needle into   it (aspiration). °· Surgery to remove: °¨ Dead or dying tissue from a bone or joint. °¨ An infected artificial joint. °¨ Infected plates or screws that were used to repair a broken bone. °HOME CARE INSTRUCTIONS °· Take medicines only as directed by your health care provider. °· Take your antibiotic medicine as directed by your health care provider. Finish the antibiotic even if you start  to feel better. °· Follow instructions from your health care provider about how to take IV antibiotics at home. °· Ask your health care provider if you have any restrictions on your activities. °· Keep all follow-up visits as directed by your health care provider. This is important. °SEEK MEDICAL CARE IF: °· You have a fever or chills. °· You have redness, warmth, pain, or swelling that returns after treatment. °SEEK IMMEDIATE MEDICAL CARE IF: °· You have rapid breathing or you have trouble breathing. °· You have chest pain. °· You cannot drink fluids or make urine. °· The affected arm or leg swells, changes color, or turns blue. °  °This information is not intended to replace advice given to you by your health care provider. Make sure you discuss any questions you have with your health care provider. °  °Document Released: 04/21/2005 Document Revised: 09/05/2014 Document Reviewed: 04/19/2014 °Elsevier Interactive Patient Education ©2016 [employer] ° °

## 2015-03-17 NOTE — Progress Notes (Signed)
Patient discharged.  Leaving with one prescription for Clindamycin.  Patient reports he understands discharge instructions regarding Metformin.  Room air.  Wife at bedside.  No s/s of pain or distress.  No complaints.  A&O x4.

## 2015-03-19 ENCOUNTER — Telehealth: Payer: Self-pay | Admitting: Surgery

## 2015-03-19 ENCOUNTER — Telehealth: Payer: Self-pay

## 2015-03-19 NOTE — Telephone Encounter (Signed)
Per Dorian Heckle, clinical team lead- Mr Massad needed to be scheduled for an office visit to discuss his aortogram that he refused. When I called to schedule an appointment he stated that he is dealing with MRSA in his foot and doesn't think anyone would want to work on him and in fact, he didn't want them to. He stated that he doesn't want to lose his foot and he needed to get this MRSA taken care of first.   When I tried to explain that we needed to discuss all of the above with him in the office, he blatantly stated "DO NOT SCHEDULE ME FOR ANYTHING". I have notified Colletta Maryland of his refusal.

## 2015-03-19 NOTE — Telephone Encounter (Signed)
Received call from Dr. Bridgett Larsson stating that Mr. Vandruff was discharged from [facility] and needed surgical instructions/orders for aortogram that is scheduled for tomorrow. Called patient to give instructions. Patient states he knew nothing about the procedure and did not want to proceed. He states, "I have an infection and need to get this cleared up before I do anything else." This nurse instructed patient that a scheduler from [facility] would call patient with an appointment date/time for follow-up and to discuss in detail what to expect with an aortogram. Patient states, "that's fine but I have so many other appointments too." Spoke with Garth Bigness, front office supervisor for [facility], who states Dr. Donnetta Hutching had an opening tomorrow for an office visit. Hinton Dyer stated that she would attempt to reach patient.

## 2015-03-20 ENCOUNTER — Ambulatory Visit: Payer: Self-pay | Admitting: Vascular Surgery

## 2015-03-20 SURGERY — ABDOMINAL AORTOGRAM
Anesthesia: LOCAL

## 2015-03-26 ENCOUNTER — Ambulatory Visit (INDEPENDENT_AMBULATORY_CARE_PROVIDER_SITE_OTHER): Payer: Commercial Managed Care - HMO | Admitting: Internal Medicine

## 2015-03-26 VITALS — BP 136/88 | HR 80 | Temp 98.2°F | Resp 20 | Ht 70.0 in | Wt 193.8 lb

## 2015-03-26 DIAGNOSIS — E1159 Type 2 diabetes mellitus with other circulatory complications: Secondary | ICD-10-CM

## 2015-03-26 DIAGNOSIS — E1151 Type 2 diabetes mellitus with diabetic peripheral angiopathy without gangrene: Secondary | ICD-10-CM

## 2015-03-26 DIAGNOSIS — I739 Peripheral vascular disease, unspecified: Secondary | ICD-10-CM

## 2015-03-26 DIAGNOSIS — E1165 Type 2 diabetes mellitus with hyperglycemia: Secondary | ICD-10-CM

## 2015-03-26 DIAGNOSIS — E781 Pure hyperglyceridemia: Secondary | ICD-10-CM | POA: Diagnosis not present

## 2015-03-26 DIAGNOSIS — IMO0002 Reserved for concepts with insufficient information to code with codable children: Secondary | ICD-10-CM

## 2015-03-26 DIAGNOSIS — E11621 Type 2 diabetes mellitus with foot ulcer: Secondary | ICD-10-CM

## 2015-03-26 DIAGNOSIS — M86172 Other acute osteomyelitis, left ankle and foot: Secondary | ICD-10-CM

## 2015-03-26 DIAGNOSIS — L97523 Non-pressure chronic ulcer of other part of left foot with necrosis of muscle: Secondary | ICD-10-CM

## 2015-03-26 DIAGNOSIS — I2510 Atherosclerotic heart disease of native coronary artery without angina pectoris: Secondary | ICD-10-CM

## 2015-03-26 MED ORDER — METFORMIN HCL 500 MG PO TABS: 1000.0000 mg | ORAL_TABLET | Freq: Two times a day (BID) | ORAL | Status: DC

## 2015-03-26 MED ORDER — ICOSAPENT ETHYL 1 G PO CAPS: 2.0000 | ORAL_CAPSULE | Freq: Two times a day (BID) | ORAL | Status: DC

## 2015-03-26 NOTE — Patient Instructions (Signed)

## 2015-03-26 NOTE — Progress Notes (Signed)
Pre visit review using our clinic review tool, if applicable. No additional management support is needed unless otherwise documented below in the visit note. 

## 2015-03-26 NOTE — Progress Notes (Signed)
Subjective:  Patient ID: [name], male    DOB: July 12, 1955  Age: 59 y.o. MRN: [id number]  CC: Follow-up   HPI [name] presents for hospital f/up regarding his left foot - he has osteomyelitis and PAD and was advised to have surgery but he refused. He tells me that the foot feels better with less pain and no more seepage of exudate from the callous/ulcer but he is concerned that the callous/ulcer is not any smaller. He is tolerating the clindamycin well. He wants to f/up with his vascular surgeon, Dr. Trula Slade.  Outpatient Prescriptions Prior to Visit  Medication Sig Dispense Refill  . aspirin 81 MG tablet Take 81 mg by mouth daily.    . cilostazol (PLETAL) 100 MG tablet Take 1 tablet (100 mg total) by mouth 2 (two) times daily. 180 tablet 3  . clindamycin (CLEOCIN) 300 MG capsule Take 1 capsule (300 mg total) by mouth 3 (three) times daily. 42 capsule 0  . clopidogrel (PLAVIX) 75 MG tablet Take 1 tablet (75 mg total) by mouth daily. 90 tablet 2  . glimepiride (AMARYL) 1 MG tablet Take 1 tablet (1 mg total) by mouth daily with breakfast. 90 tablet 1  . glucose blood (TRUETEST TEST) test strip Use as instructed to check blood sugar twice daily dx 250.00 200 each 3  . Insulin Glargine (TOUJEO SOLOSTAR) 300 UNIT/ML SOPN Inject 55 Units into the skin daily. 1.5 mL 11  . Insulin Pen Needle (NOVOFINE) 32G X 6 MM MISC 1 Act by Does not apply route daily. Use 1 QD 100 each 3  . isosorbide mononitrate (IMDUR) 30 MG 24 hr tablet Take 1 tablet (30 mg total) by mouth 2 (two) times daily. 180 tablet 3  . lisinopril (PRINIVIL,ZESTRIL) 40 MG tablet Take 1 tablet (40 mg total) by mouth daily. 90 tablet 2  . metoprolol (LOPRESSOR) 50 MG tablet Take 1 tablet (50 mg total) by mouth 2 (two) times daily. 180 tablet 3  . Omega-3 Fatty Acids (FISH OIL PO) Take 1,200 mg by mouth daily.    . pravastatin (PRAVACHOL) 80 MG tablet Take 1 tablet (80 mg total) by mouth daily. 90 tablet 3  . TRUEPLUS LANCETS  28G MISC 1 each by Does not apply route 2 (two) times daily. Dx 250.00 200 each 3  . calcium elemental as carbonate (BARIATRIC TUMS ULTRA) 400 MG tablet Chew 1,000 mg by mouth 2 (two) times daily as needed for heartburn.     . omega-3 acid ethyl esters (LOVAZA) 1 G capsule Take 2 capsules (2 g total) by mouth 2 (two) times daily. (Patient not taking: Reported on 03/16/2015) 120 capsule 11   No facility-administered medications prior to visit.    ROS Review of Systems  Constitutional: Negative.  Negative for fever, chills and fatigue.  HENT: Negative.   Eyes: Negative.   Respiratory: Negative.  Negative for cough, choking, shortness of breath and stridor.   Cardiovascular: Negative.  Negative for chest pain, palpitations and leg swelling.  Gastrointestinal: Negative.  Negative for nausea, vomiting, abdominal pain, diarrhea and constipation.  Endocrine: Negative.   Genitourinary: Negative.   Musculoskeletal: Positive for arthralgias. Negative for myalgias, back pain and neck pain.  Skin: Positive for wound. Negative for color change, pallor and rash.  Allergic/Immunologic: Negative.   Neurological: Negative.   Hematological: Negative.  Negative for adenopathy. Does not bruise/bleed easily.  Psychiatric/Behavioral: Negative.     Objective:  BP 136/88 mmHg  Pulse 80  Temp(Src) 98.2 F (  36.8 C) (Oral)  Resp 20  Ht 5\' 10"  (1.778 m)  Wt 193 lb 12 oz (87.884 kg)  BMI 27.80 kg/m2  SpO2 96%  BP Readings from Last 3 Encounters:  03/26/15 136/88  03/17/15 143/77  02/21/15 120/78    Wt Readings from Last 3 Encounters:  03/26/15 193 lb 12 oz (87.884 kg)  03/16/15 194 lb 3.6 oz (88.1 kg)  02/21/15 181 lb (82.101 kg)    Physical Exam  Constitutional: He is oriented to person, place, and time. No distress.  HENT:  Head: Normocephalic and atraumatic.  Mouth/Throat: Oropharynx is clear and moist. No oropharyngeal exudate.  Eyes: Conjunctivae are normal. Right eye exhibits no  discharge. Left eye exhibits no discharge. No scleral icterus.  Neck: Normal range of motion. Neck supple. No JVD present. No tracheal deviation present. No thyromegaly present.  Cardiovascular: Normal rate, regular rhythm, normal heart sounds and intact distal pulses.  Exam reveals no gallop and no friction rub.   No murmur heard. Pulses:      Dorsalis pedis pulses are 0 on the right side, and 0 on the left side.       Posterior tibial pulses are 0 on the right side, and 0 on the left side.  Pulmonary/Chest: Effort normal and breath sounds normal. No stridor. No respiratory distress. He has no wheezes. He has no rales. He exhibits no tenderness.  Abdominal: Soft. Bowel sounds are normal. He exhibits no distension and no mass. There is no tenderness. There is no rebound and no guarding.  Musculoskeletal: Normal range of motion. He exhibits no edema or tenderness.       Left foot: There is decreased capillary refill and deformity. There is normal range of motion, no tenderness, no bony tenderness, no swelling, no crepitus and no laceration.       Feet:  Lymphadenopathy:    He has no cervical adenopathy.  Neurological: He is oriented to person, place, and time.  Skin: Skin is warm and dry. No rash noted. He is not diaphoretic. No erythema. No pallor.    Lab Results  Component Value Date   WBC 8.6 03/17/2015   HGB 13.5 03/17/2015   HCT 39.1 03/17/2015   PLT 237 03/17/2015   GLUCOSE 132* 03/17/2015   CHOL 228* 01/30/2015   TRIG * 01/30/2015    1324.0 Triglyceride is over 400; calculations on Lipids are invalid.   HDL 30.00* 01/30/2015   LDLDIRECT 71.0 01/30/2015   LDLCALC 35 09/19/2013   ALT 14* 03/17/2015   AST 16 03/17/2015   NA 137 03/17/2015   K 4.3 03/17/2015   CL 106 03/17/2015   CREATININE 0.97 03/17/2015   BUN 11 03/17/2015   CO2 23 03/17/2015   TSH 1.024 03/17/2015   PSA 0.61 10/16/2011   INR 1.0 ratio 06/11/2009   HGBA1C 7.9* 01/30/2015   MICROALBUR 7.2* 01/30/2015      No results found.  Assessment & Plan:   [name] was seen today for follow-up.  Diagnoses and all orders for this visit:  Hypertriglyceridemia- will restart vascepa -     Icosapent Ethyl 1 G CAPS; Take 2 capsules by mouth 2 (two) times daily.  PAD (peripheral artery disease) ([place])- he will f/up with vascular very soon -     Ambulatory referral to Vascular Surgery  Type 2 diabetes mellitus with vascular disease ([place])-- will restart metformin   metFORMIN (GLUCOPHAGE) 500 MG tablet; Take 2 tablets (1,000 mg total) by mouth 2 (two) times daily with a  meal.  Uncontrolled diabetes mellitus type 2 with peripheral artery disease ([facility]) -     metFORMIN (GLUCOPHAGE) 500 MG tablet; Take 2 tablets (1,000 mg total) by mouth 2 (two) times daily with a meal.  Acute osteomyelitis of left foot ([place])- cont clinda, he will see vascular surgery soon and will reconsider surgery -     Ambulatory referral to Vascular Surgery  Diabetic ulcer of left foot with necrosis of muscle ([place])- some improvement noted, will get him back in to see the [facility] -     Ambulatory referral to Wound Clinic  Atherosclerosis of native coronary artery of native heart without angina pectoris -     Ambulatory referral to Cardiology   I have discontinued Mr. Aldaba's calcium elemental as carbonate and omega-3 acid ethyl esters. I am also having him start on Icosapent Ethyl and metFORMIN. Additionally, I am having him maintain his glucose blood, TRUEPLUS LANCETS 28G, cilostazol, isosorbide mononitrate, pravastatin, lisinopril, aspirin, clopidogrel, metoprolol, Insulin Pen Needle, glimepiride, Omega-3 Fatty Acids (FISH OIL PO), Insulin Glargine, and clindamycin.  Meds ordered this encounter  Medications  . Icosapent Ethyl 1 G CAPS    Sig: Take 2 capsules by mouth 2 (two) times daily.    Dispense:  120 capsule    Refill:  11  . metFORMIN (GLUCOPHAGE) 500 MG tablet    Sig: Take 2 tablets (1,000 mg total) by mouth 2 (two) times  daily with a meal.    Dispense:  360 tablet    Refill:  1     Follow-up: Return in about 3 months (around 06/26/2015).  Scarlette Calico, MD

## 2015-03-27 ENCOUNTER — Encounter: Payer: Self-pay | Admitting: Cardiology

## 2015-03-27 ENCOUNTER — Ambulatory Visit (INDEPENDENT_AMBULATORY_CARE_PROVIDER_SITE_OTHER): Payer: Commercial Managed Care - HMO | Admitting: Cardiology

## 2015-03-27 VITALS — BP 150/80 | HR 84 | Ht 70.0 in | Wt 193.0 lb

## 2015-03-27 DIAGNOSIS — I10 Essential (primary) hypertension: Secondary | ICD-10-CM

## 2015-03-27 DIAGNOSIS — E781 Pure hyperglyceridemia: Secondary | ICD-10-CM

## 2015-03-27 DIAGNOSIS — I2510 Atherosclerotic heart disease of native coronary artery without angina pectoris: Secondary | ICD-10-CM

## 2015-03-27 DIAGNOSIS — R0989 Other specified symptoms and signs involving the circulatory and respiratory systems: Secondary | ICD-10-CM | POA: Diagnosis not present

## 2015-03-27 DIAGNOSIS — I739 Peripheral vascular disease, unspecified: Secondary | ICD-10-CM

## 2015-03-27 MED ORDER — METOPROLOL TARTRATE 50 MG PO TABS: 75.0000 mg | ORAL_TABLET | Freq: Two times a day (BID) | ORAL | Status: DC

## 2015-03-27 NOTE — Patient Instructions (Signed)
Medication Instructions:  Please increase your Metoprolol to 75 mg twice a day. Continue all other medications as listed.  Testing/Procedures: Please have carotid doppler in 06/2015.   Please schedule to see Dr Trula Slade ASAP. You have been referred to the [facility].  Follow-Up: Follow up in 6 months with Dr. Aundra Dubin.  You will receive a letter in the mail 2 months before you are due.  Please call us when you receive this letter to schedule your follow up appointment.  If you need a refill on your cardiac medications before your next appointment, please call your pharmacy.  Thank you for choosing [facility]!!

## 2015-03-27 NOTE — Progress Notes (Signed)
Patient ID: [name], male   DOB: Oct 26, 1955, 59 y.o.   MRN: [id number] PCP: [name]  59 yo with history of CAD, severe PAD, and diabetes presents for followup. He had NSTEMI in 6/09 that was treated medically as patient refused catheterization due to financial issues. He also has severe claudication. Patient had catheterization in 2/11 showing totally occluded circumflex artery with some collaterals as well as severe PAD (see PMH for description).    Recently, [name] developed a left 5th toe ulceration and osteomyelitis.  Amputation was recommended during recent hospitalization, but he refused.  He continues to have pain in his left foot.  He does not have pain in his calves.  Ambulation is limited by left foot pain.  No chest pain.  No exertional dyspnea but unable to walk far.  He remains off cigarettes though his wife still smokes.  He was unable to take Niacin, triglycerides continue to run very high. He just started fish oil.    Labs (3/11): HDL 20 => 36, LDL 72, TGs 1803 => 448  Labs (2/11): K 4.4, creatinine 0.8  Labs (6/13): TGs 539, HDL 33, LDL 84, K 4.7, creatinine 1.2 Labs (4/14): K 4.2, creatinine 1.0, TGs 553, LDL 79, HDL 31 Labs (6/14): TGs 759 (not on Lovaza), HDL 28, LDL 78 Labs (11/14): LDL 81, HDL 28, TGs 993 Labs (4/15): K 4.7, creatinine 1.0 Labs (10/15): K 4.9, creatinine 0.9, triglycerides 1347, LDL 63 Labs (9/16): TGs 1324, LDL 71 Labs (11/16): TSH normal, K 4.3, creatinine 0.97  ECG: NSR, normal  Allergies (verified):  No Known Drug Allergies   Past Medical History:  1. Non-Hodgkin lymphoma. This was treated about 10 years ago with radiation, it is now in remission. He had it in his axillary area and inguinal areas.  2. Formerly morbidly obese, but lost 100 pounds by diet and exercise.  3. Hypertension.  4. Hypercholesterolemia with prominent hypertriglyceridemia.  Unable to tolerate fenofibrate due to joint pains. Myalgias with atorvastatin.  Unable to  tolerate niacin.  5. History of alcohol abuse.  6. Prior smoker. ? COPD.  7. Coronary artery disease. The patient did have a non-ST-elevation MI in June 2009 that was treated medically. The patient refused LHC at that time. LHC done 2/11 showed totally occluded CFX with faint R->L collaterals, 40-50% proximal LAD, patent RCA. Renal arteries were patent.  Lexiscan Sestamibi (4/14) with EF 61%, primarily fixed medium-sized basal to mid inferolateral perfusion defect suggestive of prior MI with minimal ischemia.  8. Echocardiogram done in June 2009 showed an EF of 60%, mild LVH, no regional wall motion abnormalities.  9. PAD. He underwent ABIs and lower extremity duplex scanning on February 18, 2008. This demonstrated occlusion of the distal right superficial femoral artery with reconstitution at the level of the popliteal artery. There appeared to be some degree of inflow disease because of biphasic common femoral waveforms. The ABI was 0.49 on the right and 0.87 on the left. Peripheral angiogram (2/11) showed 50% L CIA stenosis, 90% stenosis of L SFA and popliteal, totally occluded R SFA with reconstitution of popliteal. Fem-pop bypass would be option => he has seen Dr. Trula Slade, would operate for pain at rest or tissue ischemia.  ABIs (2/14) with 0.66 on right, 0.69 on left.  ABIs (7/14) 0.67 on right, 0.79 on left.  ABIs (11/16) 0.56 on right and 0.43 on left.  10. Type II diabetes: Peripheral edema with Actos.  11. Carotid stenosis: Carotid dopplers (8/16) with 60-79%  LICA stenosis.   Family History:  The patient's mother had a myocardial infarction in her 74s. He had 2 aunts with myocardial infarctions in their 42s. His father had peripheral arterial disease. No diabetes in his immediate family.   Social History:  The patient lives with his wife. He is an unemployed Biomedical scientist, originally from [place]. He has no children. He quit smoking in 10/13. He used to drink heavily but has quit. He is on  disability.   Review of Systems  All systems reviewed and negative except as per HPI.   Current Outpatient Prescriptions  Medication Sig Dispense Refill  . aspirin 81 MG tablet Take 81 mg by mouth daily.    . cilostazol (PLETAL) 100 MG tablet Take 1 tablet (100 mg total) by mouth 2 (two) times daily. 180 tablet 3  . clindamycin (CLEOCIN) 300 MG capsule Take 1 capsule (300 mg total) by mouth 3 (three) times daily. 42 capsule 0  . clopidogrel (PLAVIX) 75 MG tablet Take 1 tablet (75 mg total) by mouth daily. 90 tablet 2  . glimepiride (AMARYL) 1 MG tablet Take 1 tablet (1 mg total) by mouth daily with breakfast. 90 tablet 1  . glucose blood (TRUETEST TEST) test strip Use as instructed to check blood sugar twice daily dx 250.00 200 each 3  . Icosapent Ethyl 1 G CAPS Take 2 capsules by mouth 2 (two) times daily. 120 capsule 11  . Insulin Glargine (TOUJEO SOLOSTAR) 300 UNIT/ML SOPN Inject 55 Units into the skin daily. 1.5 mL 11  . Insulin Pen Needle (NOVOFINE) 32G X 6 MM MISC 1 Act by Does not apply route daily. Use 1 QD 100 each 3  . isosorbide mononitrate (IMDUR) 30 MG 24 hr tablet Take 1 tablet (30 mg total) by mouth 2 (two) times daily. 180 tablet 3  . lisinopril (PRINIVIL,ZESTRIL) 40 MG tablet Take 1 tablet (40 mg total) by mouth daily. 90 tablet 2  . metFORMIN (GLUCOPHAGE) 500 MG tablet Take 2 tablets (1,000 mg total) by mouth 2 (two) times daily with a meal. 360 tablet 1  . metoprolol (LOPRESSOR) 50 MG tablet Take 1.5 tablets (75 mg total) by mouth 2 (two) times daily. 270 tablet 3  . Omega-3 Fatty Acids (FISH OIL PO) Take 1,200 mg by mouth daily.    . pravastatin (PRAVACHOL) 80 MG tablet Take 1 tablet (80 mg total) by mouth daily. 90 tablet 3  . TRUEPLUS LANCETS 28G MISC 1 each by Does not apply route 2 (two) times daily. Dx 250.00 200 each 3   No current facility-administered medications for this visit.    BP 150/80 mmHg  Pulse 84  Ht 5\' 10"  (1.778 m)  Wt 193 lb (87.544 kg)  BMI  27.69 kg/m2 General: NAD Neck: No JVD, no thyromegaly or thyroid nodule.  Lungs: Distant breath sounds.  CV: Nondisplaced PMI.  Heart regular S1/S2, no S3/S4, 2/6 early SEM.  No peripheral edema.  Left carotid bruit.  Unable to palpate DP or PT pulses on either leg. Abdomen: Soft, nontender, no hepatosplenomegaly, no distention.   Neurologic: Alert and oriented x 3.  Psych: Normal affect. Extremities: No clubbing or cyanosis.   Assessment/Plan:  CAD, NATIVE VESSEL  Known CAD.  Continue ASA 81 and Plavix. He will continue ACEI, beta blocker, and statin.  Lexiscan Sestamibi in 4/14 was suggestive of prior LCx-territory MI (totally occluded LCx on last cath).  He has not had significant angina recently.  PVD WITH CLAUDICATION  Known severe PAD (  see PMH). ABI on left has worsened and he has had osteomyelitis in the left foot.  He needs followup with vascular, I will try to arrange an appointment with Dr. Trula Slade soon. HYPERLIPIDEMIA Randall Speller has had very high triglycerides. He is on pravastatin.  He was unable to tolerate fenofibrate, niacin, or Lovaza.  He is trying again with fish oil . I would like him to followup with our lipid clinic, will arrange. Hypertension BP high today.  Will increase metoprolol to 75 mg bid.  Carotid bruit Moderate stenosis on left, will arrange for carotid dopplers in 2/17.      Loralie Champagne 03/27/2015

## 2015-03-30 ENCOUNTER — Inpatient Hospital Stay (HOSPITAL_COMMUNITY)
Admission: EM | Admit: 2015-03-30 | Discharge: 2015-04-01 | DRG: 638 | Discharge status: Against Medical Advice | Payer: Commercial Managed Care - HMO | Attending: Internal Medicine | Admitting: Internal Medicine

## 2015-03-30 ENCOUNTER — Encounter (HOSPITAL_COMMUNITY): Payer: Self-pay | Admitting: Emergency Medicine

## 2015-03-30 ENCOUNTER — Emergency Department (HOSPITAL_COMMUNITY): Payer: Commercial Managed Care - HMO

## 2015-03-30 DIAGNOSIS — R531 Weakness: Secondary | ICD-10-CM | POA: Diagnosis not present

## 2015-03-30 DIAGNOSIS — Z87891 Personal history of nicotine dependence: Secondary | ICD-10-CM

## 2015-03-30 DIAGNOSIS — M86172 Other acute osteomyelitis, left ankle and foot: Secondary | ICD-10-CM

## 2015-03-30 DIAGNOSIS — M868X7 Other osteomyelitis, ankle and foot: Secondary | ICD-10-CM | POA: Diagnosis not present

## 2015-03-30 DIAGNOSIS — E118 Type 2 diabetes mellitus with unspecified complications: Secondary | ICD-10-CM | POA: Diagnosis not present

## 2015-03-30 DIAGNOSIS — E1165 Type 2 diabetes mellitus with hyperglycemia: Secondary | ICD-10-CM | POA: Diagnosis present

## 2015-03-30 DIAGNOSIS — M79672 Pain in left foot: Secondary | ICD-10-CM | POA: Diagnosis present

## 2015-03-30 DIAGNOSIS — E11621 Type 2 diabetes mellitus with foot ulcer: Secondary | ICD-10-CM | POA: Diagnosis not present

## 2015-03-30 DIAGNOSIS — E1151 Type 2 diabetes mellitus with diabetic peripheral angiopathy without gangrene: Secondary | ICD-10-CM | POA: Diagnosis present

## 2015-03-30 DIAGNOSIS — M8610 Other acute osteomyelitis, unspecified site: Secondary | ICD-10-CM | POA: Diagnosis not present

## 2015-03-30 DIAGNOSIS — I2510 Atherosclerotic heart disease of native coronary artery without angina pectoris: Secondary | ICD-10-CM | POA: Diagnosis not present

## 2015-03-30 DIAGNOSIS — Z79899 Other long term (current) drug therapy: Secondary | ICD-10-CM

## 2015-03-30 DIAGNOSIS — Z8249 Family history of ischemic heart disease and other diseases of the circulatory system: Secondary | ICD-10-CM

## 2015-03-30 DIAGNOSIS — I10 Essential (primary) hypertension: Secondary | ICD-10-CM | POA: Diagnosis not present

## 2015-03-30 DIAGNOSIS — E1169 Type 2 diabetes mellitus with other specified complication: Secondary | ICD-10-CM | POA: Diagnosis not present

## 2015-03-30 DIAGNOSIS — M86672 Other chronic osteomyelitis, left ankle and foot: Secondary | ICD-10-CM

## 2015-03-30 DIAGNOSIS — M86272 Subacute osteomyelitis, left ankle and foot: Secondary | ICD-10-CM | POA: Diagnosis present

## 2015-03-30 LAB — CBC WITH DIFFERENTIAL/PLATELET
BASOS ABS: 0 10*3/uL (ref 0.0–0.1)
BASOS PCT: 0 %
Eosinophils Absolute: 0.1 10*3/uL (ref 0.0–0.7)
Eosinophils Relative: 1 %
HEMATOCRIT: 45.3 % (ref 39.0–52.0)
HEMOGLOBIN: 15.6 g/dL (ref 13.0–17.0)
LYMPHS PCT: 5 %
Lymphs Abs: 0.8 10*3/uL (ref 0.7–4.0)
MCH: 34 pg (ref 26.0–34.0)
MCHC: 34.4 g/dL (ref 30.0–36.0)
MCV: 98.7 fL (ref 78.0–100.0)
MONOS PCT: 6 %
Monocytes Absolute: 0.9 10*3/uL (ref 0.1–1.0)
NEUTROS ABS: 13 10*3/uL — AB (ref 1.7–7.7)
Neutrophils Relative %: 88 %
Platelets: 231 10*3/uL (ref 150–400)
RBC: 4.59 MIL/uL (ref 4.22–5.81)
RDW: 15.5 % (ref 11.5–15.5)
WBC: 14.7 10*3/uL — ABNORMAL HIGH (ref 4.0–10.5)

## 2015-03-30 LAB — COMPREHENSIVE METABOLIC PANEL
ALK PHOS: 61 U/L (ref 38–126)
ALT: 14 U/L — AB (ref 17–63)
ANION GAP: 9 (ref 5–15)
AST: 16 U/L (ref 15–41)
Albumin: 4.5 g/dL (ref 3.5–5.0)
BUN: 12 mg/dL (ref 6–20)
CALCIUM: 9.5 mg/dL (ref 8.9–10.3)
CO2: 23 mmol/L (ref 22–32)
CREATININE: 1.07 mg/dL (ref 0.61–1.24)
Chloride: 101 mmol/L (ref 101–111)
Glucose, Bld: 180 mg/dL — ABNORMAL HIGH (ref 65–99)
Potassium: 4.4 mmol/L (ref 3.5–5.1)
SODIUM: 133 mmol/L — AB (ref 135–145)
Total Bilirubin: 0.9 mg/dL (ref 0.3–1.2)
Total Protein: 7.7 g/dL (ref 6.5–8.1)

## 2015-03-30 LAB — I-STAT CG4 LACTIC ACID, ED: Lactic Acid, Venous: 1.71 mmol/L (ref 0.5–2.0)

## 2015-03-30 LAB — GLUCOSE, CAPILLARY
Glucose-Capillary: 140 mg/dL — ABNORMAL HIGH (ref 65–99)
Glucose-Capillary: 104 mg/dL — ABNORMAL HIGH (ref 65–99)

## 2015-03-30 MED ORDER — ASPIRIN EC 81 MG PO TBEC
81.0000 mg | DELAYED_RELEASE_TABLET | Freq: Every day | ORAL | Status: DC
  Administered 2015-03-31 – 2015-04-01 (×2): 81 mg via ORAL
  Filled 2015-03-30 (×2): qty 1

## 2015-03-30 MED ORDER — INSULIN GLARGINE 100 UNIT/ML ~~LOC~~ SOLN
55.0000 [IU] | Freq: Every day | SUBCUTANEOUS | Status: DC
  Administered 2015-03-31 – 2015-04-01 (×2): 55 [IU] via SUBCUTANEOUS
  Filled 2015-03-30 (×2): qty 0.55

## 2015-03-30 MED ORDER — CILOSTAZOL 100 MG PO TABS
100.0000 mg | ORAL_TABLET | Freq: Two times a day (BID) | ORAL | Status: DC
  Administered 2015-03-30 – 2015-04-01 (×4): 100 mg via ORAL
  Filled 2015-03-30 (×4): qty 1

## 2015-03-30 MED ORDER — VANCOMYCIN HCL IN DEXTROSE 1-5 GM/200ML-% IV SOLN
1000.0000 mg | Freq: Once | INTRAVENOUS | Status: AC
  Administered 2015-03-30: 1000 mg via INTRAVENOUS
  Filled 2015-03-30: qty 200

## 2015-03-30 MED ORDER — INSULIN ASPART 100 UNIT/ML ~~LOC~~ SOLN
0.0000 [IU] | Freq: Three times a day (TID) | SUBCUTANEOUS | Status: DC
  Administered 2015-03-30: 1 [IU] via SUBCUTANEOUS
  Administered 2015-03-31: 2 [IU] via SUBCUTANEOUS
  Administered 2015-03-31: 1 [IU] via SUBCUTANEOUS
  Administered 2015-03-31: 2 [IU] via SUBCUTANEOUS
  Administered 2015-04-01: 1 [IU] via SUBCUTANEOUS

## 2015-03-30 MED ORDER — PRAVASTATIN SODIUM 80 MG PO TABS
80.0000 mg | ORAL_TABLET | Freq: Every day | ORAL | Status: DC
  Administered 2015-03-30 – 2015-04-01 (×3): 80 mg via ORAL
  Filled 2015-03-30 (×3): qty 1

## 2015-03-30 MED ORDER — ONDANSETRON HCL 4 MG/2ML IJ SOLN: 4.0000 mg | Freq: Three times a day (TID) | INTRAMUSCULAR | Status: AC | PRN

## 2015-03-30 MED ORDER — INSULIN ASPART 100 UNIT/ML ~~LOC~~ SOLN: 0.0000 [IU] | Freq: Every day | SUBCUTANEOUS | Status: DC

## 2015-03-30 MED ORDER — ISOSORBIDE MONONITRATE ER 30 MG PO TB24
30.0000 mg | ORAL_TABLET | Freq: Two times a day (BID) | ORAL | Status: DC
  Administered 2015-03-30 – 2015-04-01 (×4): 30 mg via ORAL
  Filled 2015-03-30 (×4): qty 1

## 2015-03-30 MED ORDER — SODIUM CHLORIDE 0.9 % IV SOLN
INTRAVENOUS | Status: AC
Start: 2015-03-30 — End: 2015-03-31
  Administered 2015-03-30: 17:00:00 via INTRAVENOUS

## 2015-03-30 MED ORDER — HYDROMORPHONE HCL 1 MG/ML IJ SOLN
1.0000 mg | INTRAMUSCULAR | Status: AC | PRN
  Administered 2015-03-30: 1 mg via INTRAVENOUS
  Filled 2015-03-30: qty 1

## 2015-03-30 MED ORDER — PIPERACILLIN-TAZOBACTAM 3.375 G IVPB
3.3750 g | Freq: Three times a day (TID) | INTRAVENOUS | Status: DC
  Administered 2015-03-30 – 2015-04-01 (×5): 3.375 g via INTRAVENOUS
  Filled 2015-03-30 (×7): qty 50

## 2015-03-30 MED ORDER — HYDROMORPHONE HCL 1 MG/ML IJ SOLN
1.0000 mg | Freq: Once | INTRAMUSCULAR | Status: AC
  Administered 2015-03-30: 1 mg via INTRAVENOUS
  Filled 2015-03-30: qty 1

## 2015-03-30 MED ORDER — ACETAMINOPHEN 325 MG PO TABS: 650.0000 mg | ORAL_TABLET | Freq: Four times a day (QID) | ORAL | Status: DC | PRN

## 2015-03-30 MED ORDER — OXYCODONE HCL 5 MG PO TABS
5.0000 mg | ORAL_TABLET | ORAL | Status: DC | PRN
  Administered 2015-03-30 – 2015-04-01 (×5): 5 mg via ORAL
  Filled 2015-03-30 (×5): qty 1

## 2015-03-30 MED ORDER — CLOPIDOGREL BISULFATE 75 MG PO TABS
75.0000 mg | ORAL_TABLET | Freq: Every day | ORAL | Status: DC
  Administered 2015-03-31 – 2015-04-01 (×2): 75 mg via ORAL
  Filled 2015-03-30 (×2): qty 1

## 2015-03-30 MED ORDER — ACETAMINOPHEN 650 MG RE SUPP: 650.0000 mg | Freq: Four times a day (QID) | RECTAL | Status: DC | PRN

## 2015-03-30 MED ORDER — ENOXAPARIN SODIUM 40 MG/0.4ML ~~LOC~~ SOLN
40.0000 mg | SUBCUTANEOUS | Status: DC
  Administered 2015-03-30 – 2015-03-31 (×2): 40 mg via SUBCUTANEOUS
  Filled 2015-03-30 (×2): qty 0.4

## 2015-03-30 MED ORDER — MORPHINE SULFATE (PF) 2 MG/ML IV SOLN: 1.0000 mg | INTRAVENOUS | Status: DC | PRN

## 2015-03-30 MED ORDER — METOPROLOL TARTRATE 50 MG PO TABS
75.0000 mg | ORAL_TABLET | Freq: Two times a day (BID) | ORAL | Status: DC
  Administered 2015-03-30 – 2015-04-01 (×4): 75 mg via ORAL
  Filled 2015-03-30 (×4): qty 1

## 2015-03-30 MED ORDER — VANCOMYCIN HCL IN DEXTROSE 1-5 GM/200ML-% IV SOLN
1000.0000 mg | Freq: Two times a day (BID) | INTRAVENOUS | Status: DC
  Administered 2015-03-30 – 2015-04-01 (×4): 1000 mg via INTRAVENOUS
  Filled 2015-03-30 (×5): qty 200

## 2015-03-30 MED ORDER — PIPERACILLIN-TAZOBACTAM 3.375 G IVPB
3.3750 g | Freq: Once | INTRAVENOUS | Status: AC
  Administered 2015-03-30: 3.375 g via INTRAVENOUS
  Filled 2015-03-30: qty 50

## 2015-03-30 MED ORDER — SODIUM CHLORIDE 0.9 % IV BOLUS (SEPSIS)
1000.0000 mL | Freq: Once | INTRAVENOUS | Status: AC
  Administered 2015-03-30: 1000 mL via INTRAVENOUS

## 2015-03-30 NOTE — Progress Notes (Signed)
ANTIBIOTIC CONSULT NOTE - INITIAL  Pharmacy Consult for vancomycin and zosyn Indication: osteomyelitis/wound infection  Allergies  Allergen Reactions  . Actos [Pioglitazone]     Rash and edema   . Fenofibrate Other (See Comments)    Muscle weakness/cramping/burning  . Chlorhexidine Gluconate Rash  . Orange Oil Rash  . Soap Other (See Comments)    States that most soaps make him break out/rash-he uses a sensitive skin soap and shampoos as well, has to use specific allergen free soap and shampoo    Patient Measurements: Height: 5\' 10"  (177.8 cm) Weight: 190 lb 7.6 oz (86.4 kg) IBW/kg (Calculated) : 73 Adjusted Body Weight:   Vital Signs: Temp: 98.9 F (37.2 C) (11/25 1551) Temp Source: Oral (11/25 1551) BP: 121/62 mmHg (11/25 1551) Pulse Rate: 100 (11/25 1551) Intake/Output from previous day:   Intake/Output from this shift:    Labs:  Recent Labs  03/30/15 1035  WBC 14.7*  HGB 15.6  PLT 231  CREATININE 1.07   Estimated Creatinine Clearance: 75.8 mL/min (by C-G formula based on Cr of 1.07). No results for input(s): VANCOTROUGH, VANCOPEAK, VANCORANDOM, GENTTROUGH, GENTPEAK, GENTRANDOM, TOBRATROUGH, TOBRAPEAK, TOBRARND, AMIKACINPEAK, AMIKACINTROU, AMIKACIN in the last 72 hours.   Microbiology: No results found for this or any previous visit (from the past 720 hour(s)).  Medical History: Past Medical History  Diagnosis Date  . CAD, NATIVE VESSEL 04/16/2008    Qualifier: Diagnosis of  By: Aundra Dubin, MD, [place]    . Essential hypertension 04/16/2008    Qualifier: Diagnosis of  By: Aundra Dubin, MD, [place]    . Hyperlipidemia with target LDL less than 100 04/16/2008        . Hypertriglyceridemia 03/26/2014  . PAD (peripheral artery disease) ([place]) 03/26/2014  . Peripheral vascular disease, unspecified ([place]) 11/17/2011  . Type 2 diabetes mellitus with vascular disease ([place]) 06/22/2009    Qualifier: Diagnosis of  By: Karrie Meres RN, BSN, [place]    . Uncontrolled diabetes  mellitus type 2 with peripheral artery disease ([place]) 03/16/2015  . Acute osteomyelitis of left foot ([place]) 03/16/2015  . Cellulitis and abscess of leg, except foot 02/21/2015  . Colon cancer screening 01/30/2015  . Diabetic ulcer of left foot with necrosis of muscle ([place]) 02/21/2015  . Dyslipidemia associated with type 2 diabetes mellitus ([place]) 03/16/2015  . Encounter for long-term (current) use of other medications 10/16/2011  . Left carotid bruit 12/11/2014  . Osteoarthritis of right hip 04/09/2012  . Septic arthritis of left foot ([place]) 03/16/2015   Assessment: 33 YOM presents with purulent discharge and redness of L foot. Known osteomyelitis of  L foot. He was recently admitted earlier this month for similar problems - sent home on PO clindamycin. He declined surgical intervention (amputation) during his previous admission.  Pharmacy asked to dose vancomycin/zosyn, 1st doses given in ED  11/25 >> pip/tazo  >> 11/25 >>vancomycin  >>    No culture data  WBC elevated Renal SCr WNL afebrile  Dose changes/levels:  Goal of Therapy:  Vancomycin trough level 15-20 mcg/ml  Plan:   Vancomycin 1gm IV q12h  Check trough as indicated per length of therapy or changes in renal function  Zosyn 3.375gm Iv q8h over 4h infusion  Follow renal function  Follow cultures if become available  Doreene Eland, PharmD, BCPS.   Pager: [phone] 03/30/2015 4:53 PM

## 2015-03-30 NOTE — ED Notes (Signed)
Pt can go at 15:05  [name].

## 2015-03-30 NOTE — H&P (Signed)
[facility] History and Physical  [name] [id number] DOB: [birth year] DOA: 03/30/2015  Referring physician: EDP PCP: Scarlette Calico, MD   Chief Complaint: WORSENING left foot pain and swelling.   HPI: [name] is a 59 y.o. male with h/o DM, PVD, hypertension, CAD, chronic left foot ulcer recently admitted the first week of November, presents today for worsening left foot pain and swelling. He was discharged on oral clindamyc in for 2 weeks, but he reports chills today and worsening symptoms in the left foot. He was referre dto medical service for admission.    Review of Systems:  Constitutional:  Chills. HEENT:  No headaches, Difficulty swallowing,Tooth/dental problems,Sore throat,  No sneezing, itching, ear ache, nasal congestion, post nasal drip,  Cardio-vascular:  No chest pain, Orthopnea, PND, swelling in lower extremities, anasarca, dizziness, palpitations  GI:  No heartburn, indigestion, abdominal pain, nausea, vomiting, diarrhea, change in bowel habits, loss of appetite  Resp:  No shortness of breath with exertion or at rest. No excess mucus, no productive cough, No non-productive cough, No coughing up of blood.No change in color of mucus.No wheezing.No chest wall deformity  Skin:  no rash or lesions.  GU:  no dysuria, change in color of urine, no urgency or frequency. No flank pain.  Musculoskeletal:  Left foot swelling and pain.  Psych:  No change in mood or affect. No depression or anxiety. No memory loss.   Past Medical History  Diagnosis Date  . CAD, NATIVE VESSEL 04/16/2008    Qualifier: Diagnosis of  By: Aundra Dubin, MD, [place]    . Essential hypertension 04/16/2008    Qualifier: Diagnosis of  By: Aundra Dubin, MD, [place]    . Hyperlipidemia with target LDL less than 100 04/16/2008        . Hypertriglyceridemia 03/26/2014  . PAD (peripheral artery disease) ([place]) 03/26/2014  . Peripheral vascular disease, unspecified ([place]) 11/17/2011  . Type 2  diabetes mellitus with vascular disease ([place]) 06/22/2009    Qualifier: Diagnosis of  By: Karrie Meres RN, BSN, Anne    . Uncontrolled diabetes mellitus type 2 with peripheral artery disease ([place]) 03/16/2015  . Acute osteomyelitis of left foot ([place]) 03/16/2015  . Cellulitis and abscess of leg, except foot 02/21/2015  . Colon cancer screening 01/30/2015  . Diabetic ulcer of left foot with necrosis of muscle ([place]) 02/21/2015  . Dyslipidemia associated with type 2 diabetes mellitus ([place]) 03/16/2015  . Encounter for long-term (current) use of other medications 10/16/2011  . Left carotid bruit 12/11/2014  . Osteoarthritis of right hip 04/09/2012  . Septic arthritis of left foot ([place]) 03/16/2015   Past Surgical History  Procedure Laterality Date  . Cardiac catheterization      2011  . Leg surgery      on bone above ankle-not sure which bone left leg 1981  . Multiple extractions with alveoloplasty  10/31/2011    Procedure: MULTIPLE EXTRACION WITH ALVEOLOPLASTY;  Surgeon: Lenn Cal, DDS;  Location: [facility];  Service: Oral Surgery;  Laterality: N/A;  Extraction of tooth #'s 2, 4,5,6,7,8,9,10,11,12,13,15,21,22,23,24,25,26,27,28, 29, and 31 with alveoloplasty  . Leg surgery  1981    Bone cyst- Left leg   Social History:  reports that he has quit smoking. His smoking use included Cigarettes. He has a 21.5 pack-year smoking history. He has never used smokeless tobacco. He reports that he does not drink alcohol or use illicit drugs.  Allergies  Allergen Reactions  . Actos [Pioglitazone]     Rash and edema   .  Fenofibrate Other (See Comments)    Muscle weakness/cramping/burning  . Chlorhexidine Gluconate Rash  . Orange Oil Rash  . Soap Other (See Comments)    States that most soaps make him break out/rash-he uses a sensitive skin soap and shampoos as well, has to use specific allergen free soap and shampoo    Family History  Problem Relation Age of Onset  . Heart attack Mother     9's  .  Heart disease Mother     Heart disease before age 60  . Hypertension Mother   . Hyperlipidemia Mother   . Peripheral Artery Disease Father   . Hypertension Father   . Hyperlipidemia Father   . Peripheral vascular disease Father   . Heart attack      40's/40's    Prior to Admission medications   Medication Sig Start Date End Date Taking? Authorizing Provider  aspirin 81 MG tablet Take 81 mg by mouth daily.   Yes Historical Provider, MD  aspirin EC 325 MG tablet Take 975 mg by mouth once.   Yes Historical Provider, MD  cilostazol (PLETAL) 100 MG tablet Take 1 tablet (100 mg total) by mouth 2 (two) times daily. 02/20/14  Yes Rowe Clack, MD  clindamycin (CLEOCIN) 300 MG capsule Take 1 capsule (300 mg total) by mouth 3 (three) times daily. 03/17/15  Yes Robbie Lis, MD  clopidogrel (PLAVIX) 75 MG tablet Take 1 tablet (75 mg total) by mouth daily. 10/11/14  Yes Rowe Clack, MD  glimepiride (AMARYL) 1 MG tablet Take 1 tablet (1 mg total) by mouth daily with breakfast. 02/07/15  Yes Janith Lima, MD  Icosapent Ethyl 1 G CAPS Take 2 capsules by mouth 2 (two) times daily. 03/26/15  Yes Janith Lima, MD  Insulin Glargine (TOUJEO SOLOSTAR) 300 UNIT/ML SOPN Inject 55 Units into the skin daily. 03/17/15  Yes Robbie Lis, MD  isosorbide mononitrate (IMDUR) 30 MG 24 hr tablet Take 1 tablet (30 mg total) by mouth 2 (two) times daily. 02/20/14  Yes Rowe Clack, MD  lisinopril (PRINIVIL,ZESTRIL) 40 MG tablet Take 1 tablet (40 mg total) by mouth daily. 06/30/14  Yes Rowe Clack, MD  metFORMIN (GLUCOPHAGE) 500 MG tablet Take 2 tablets (1,000 mg total) by mouth 2 (two) times daily with a meal. 03/26/15  Yes Janith Lima, MD  metoprolol (LOPRESSOR) 50 MG tablet Take 1.5 tablets (75 mg total) by mouth 2 (two) times daily. Patient taking differently: Take 50 mg by mouth 2 (two) times daily.  03/27/15  Yes Larey Dresser, MD  pravastatin (PRAVACHOL) 80 MG tablet Take 1 tablet (80  mg total) by mouth daily. 02/20/14  Yes Rowe Clack, MD  glucose blood (TRUETEST TEST) test strip Use as instructed to check blood sugar twice daily dx 250.00 02/04/12   Rowe Clack, MD  Insulin Pen Needle (NOVOFINE) 32G X 6 MM MISC 1 Act by Does not apply route daily. Use 1 QD 02/01/15   Janith Lima, MD  TRUEPLUS LANCETS 28G MISC 1 each by Does not apply route 2 (two) times daily. Dx 250.00 02/04/12   Rowe Clack, MD   Physical Exam: Filed Vitals:   03/30/15 1006 03/30/15 1432 03/30/15 1551 03/30/15 1603  BP: 157/86 113/69 121/62   Pulse: 113 106 100   Temp: 99.1 F (37.3 C)  98.9 F (37.2 C)   TempSrc: Oral  Oral   Resp: 19 18 18    Height:  5\' 10"  (1.778 m)  Weight:    86.4 kg (190 lb 7.6 oz)  SpO2: 95% 92% 96%     Wt Readings from Last 3 Encounters:  03/30/15 86.4 kg (190 lb 7.6 oz)  03/27/15 87.544 kg (193 lb)  03/26/15 87.884 kg (193 lb 12 oz)    General:  Appears calm and comfortable Eyes: PERRL, normal lids, irises & conjunctiva Neck: no LAD, masses or thyromegaly Cardiovascular: RRR, no m/r/g. No LE edema. Telemetry: SR, no arrhythmias  Respiratory: CTA bilaterally, no w/r/r. Normal respiratory effort. Abdomen: soft, ntnd Skin: no rash or induration seen on limited exam Musculoskeletal: left foot ulcer , swelling and tenderness.  Psychiatric: grossly normal mood and affect, speech fluent and appropriate Neurologic: grossly non-focal.          Labs on Admission:  Basic Metabolic Panel:  Recent Labs Lab 03/30/15 1035  NA 133*  K 4.4  CL 101  CO2 23  GLUCOSE 180*  BUN 12  CREATININE 1.07  CALCIUM 9.5   Liver Function Tests:  Recent Labs Lab 03/30/15 1035  AST 16  ALT 14*  ALKPHOS 61  BILITOT 0.9  PROT 7.7  ALBUMIN 4.5   No results for input(s): LIPASE, AMYLASE in the last 168 hours. No results for input(s): AMMONIA in the last 168 hours. CBC:  Recent Labs Lab 03/30/15 1035  WBC 14.7*  NEUTROABS 13.0*  HGB 15.6    HCT 45.3  MCV 98.7  PLT 231   Cardiac Enzymes: No results for input(s): CKTOTAL, CKMB, CKMBINDEX, TROPONINI in the last 168 hours.  BNP (last 3 results) No results for input(s): BNP in the last 8760 hours.  ProBNP (last 3 results) No results for input(s): PROBNP in the last 8760 hours.  CBG: No results for input(s): GLUCAP in the last 168 hours.  Radiological Exams on Admission: Dg Foot Complete Left  03/30/2015  CLINICAL DATA:  Osteomyelitis. EXAM: LEFT FOOT - COMPLETE 3+ VIEW COMPARISON:  03/15/2015. FINDINGS: Progressive erosive changes of the distal left fifth metatarsal and base of the proximal phalanx of left fifth digit. Findings consistent with progressive osteomyelitis and septic arthritis about the left fifth metatarsal phalangeal joint. No radiopaque foreign bodies. No evidence fracture. Diffuse degenerative change. IMPRESSION: Progressive erosive changes of the distal left fifth metatarsal and base of the proximal phalanx of the left fifth digit. Findings consist with progressive osteomyelitis and septic arthritis. Electronically Signed   By: Marcello Moores  Register   On: 03/30/2015 13:32    EKG: Independently reviewed.   Assessment/Plan Active Problems:   Osteomyelitis (HCC)   Diabetic foot (HCC)   Worsening progressive erosive osteomyelitis of the left foot: Admit to med surg and start him on empiric antibiotics, vancomycin and zosyn.  Vascular surgery consulted for evaluation of angiogram and aortogram.  Elevate leg.  Wet to dry dressings.    Diabetes mellitus: CBG (last 3)   Recent Labs  03/30/15 1712  GLUCAP 140*    Resume long acting insulin and SSI.    PVD: Resume aspirin, plavix and pletal.    CAD: Currently denies any chest pain.    Hypertension: Controlled.      Code Status:full code.  DVT Prophylaxis: Family Communication: none at bedside.  Disposition Plan:  Transfer to [facility] over the week end for the angio on Monday or Tuesday by  vascular.   Time spent: 55 min  [facility] Pager [phone]

## 2015-03-30 NOTE — ED Notes (Signed)
Pt complaint of worsening redness, warmth, swelling, and pain to left foot onset this morning. Ongoing treatment for a month. Pt reports subjective fever 100.7; took aspirin at 0630.

## 2015-03-30 NOTE — ED Provider Notes (Signed)
CSN: [id number]     Arrival date & time 03/30/15  [id number] History   First MD Initiated Contact with Patient 03/30/15 1151     No chief complaint on file.    (Consider location/radiation/quality/duration/timing/severity/associated sxs/prior Treatment) HPI Patient presents with concern of left foot wound. Patient notes that he was admitted several weeks ago, for similar concerns, had IV antibiotics, transitioned to clindamycin, and has been doing generally well until the past 24 hours. Over the past day patient has developed new purulent discharge, erythema, pain about the left lateral foot. There is associated fever, generalized weakness, generalized discomfort. Minimal relief with OTC medication  Past Medical History  Diagnosis Date  . CAD, NATIVE VESSEL 04/16/2008    Qualifier: Diagnosis of  By: Aundra Dubin, MD, [place]    . Essential hypertension 04/16/2008    Qualifier: Diagnosis of  By: Aundra Dubin, MD, [place]    . Hyperlipidemia with target LDL less than 100 04/16/2008        . Hypertriglyceridemia 03/26/2014  . PAD (peripheral artery disease) ([place]) 03/26/2014  . Peripheral vascular disease, unspecified ([place]) 11/17/2011  . Type 2 diabetes mellitus with vascular disease ([place]) 06/22/2009    Qualifier: Diagnosis of  By: Karrie Meres RN, BSN, Anne    . Uncontrolled diabetes mellitus type 2 with peripheral artery disease ([place]) 03/16/2015  . Acute osteomyelitis of left foot ([place]) 03/16/2015  . Cellulitis and abscess of leg, except foot 02/21/2015  . Colon cancer screening 01/30/2015  . Diabetic ulcer of left foot with necrosis of muscle ([place]) 02/21/2015  . Dyslipidemia associated with type 2 diabetes mellitus ([place]) 03/16/2015  . Encounter for long-term (current) use of other medications 10/16/2011  . Left carotid bruit 12/11/2014  . Osteoarthritis of right hip 04/09/2012  . Septic arthritis of left foot ([place]) 03/16/2015   Past Surgical History  Procedure Laterality Date  . Cardiac catheterization        2011  . Leg surgery      on bone above ankle-not sure which bone left leg 1981  . Multiple extractions with alveoloplasty  10/31/2011    Procedure: MULTIPLE EXTRACION WITH ALVEOLOPLASTY;  Surgeon: Lenn Cal, DDS;  Location: [facility];  Service: Oral Surgery;  Laterality: N/A;  Extraction of tooth #'s 2, 4,5,6,7,8,9,10,11,12,13,15,21,22,23,24,25,26,27,28, 29, and 31 with alveoloplasty  . Leg surgery  1981    Bone cyst- Left leg   Family History  Problem Relation Age of Onset  . Heart attack Mother     66's  . Heart disease Mother     Heart disease before age 53  . Hypertension Mother   . Hyperlipidemia Mother   . Peripheral Artery Disease Father   . Hypertension Father   . Hyperlipidemia Father   . Peripheral vascular disease Father   . Heart attack      40's/40's   Social History  Substance Use Topics  . Smoking status: Former Smoker -- 0.50 packs/day for 43 years    Types: Cigarettes  . Smokeless tobacco: Never Used     Comment: heavy 2nd hand exposue ongoing  . Alcohol Use: No     Comment: former heavy user quit 2011    Review of Systems  Constitutional:       Per HPI, otherwise negative  HENT:       Per HPI, otherwise negative  Respiratory:       Per HPI, otherwise negative  Cardiovascular:       Per HPI, otherwise negative  Gastrointestinal: Negative for vomiting.  Endocrine:       Negative aside from HPI  Genitourinary:       Neg aside from HPI   Musculoskeletal:       Per HPI, otherwise negative  Skin: Positive for color change and wound.  Neurological: Positive for weakness. Negative for syncope.      Allergies  Actos; Fenofibrate; Chlorhexidine gluconate; Orange oil; and Soap  Home Medications   Prior to Admission medications   Medication Sig Start Date End Date Taking? Authorizing Provider  aspirin 81 MG tablet Take 81 mg by mouth daily.   Yes Historical Provider, MD  aspirin EC 325 MG tablet Take 975 mg by mouth once.   Yes Historical  Provider, MD  cilostazol (PLETAL) 100 MG tablet Take 1 tablet (100 mg total) by mouth 2 (two) times daily. 02/20/14  Yes Rowe Clack, MD  clindamycin (CLEOCIN) 300 MG capsule Take 1 capsule (300 mg total) by mouth 3 (three) times daily. 03/17/15  Yes Robbie Lis, MD  clopidogrel (PLAVIX) 75 MG tablet Take 1 tablet (75 mg total) by mouth daily. 10/11/14  Yes Rowe Clack, MD  glimepiride (AMARYL) 1 MG tablet Take 1 tablet (1 mg total) by mouth daily with breakfast. 02/07/15  Yes Janith Lima, MD  Icosapent Ethyl 1 G CAPS Take 2 capsules by mouth 2 (two) times daily. 03/26/15  Yes Janith Lima, MD  Insulin Glargine (TOUJEO SOLOSTAR) 300 UNIT/ML SOPN Inject 55 Units into the skin daily. 03/17/15  Yes Robbie Lis, MD  isosorbide mononitrate (IMDUR) 30 MG 24 hr tablet Take 1 tablet (30 mg total) by mouth 2 (two) times daily. 02/20/14  Yes Rowe Clack, MD  lisinopril (PRINIVIL,ZESTRIL) 40 MG tablet Take 1 tablet (40 mg total) by mouth daily. 06/30/14  Yes Rowe Clack, MD  metFORMIN (GLUCOPHAGE) 500 MG tablet Take 2 tablets (1,000 mg total) by mouth 2 (two) times daily with a meal. 03/26/15  Yes Janith Lima, MD  metoprolol (LOPRESSOR) 50 MG tablet Take 1.5 tablets (75 mg total) by mouth 2 (two) times daily. Patient taking differently: Take 50 mg by mouth 2 (two) times daily.  03/27/15  Yes Larey Dresser, MD  pravastatin (PRAVACHOL) 80 MG tablet Take 1 tablet (80 mg total) by mouth daily. 02/20/14  Yes Rowe Clack, MD  glucose blood (TRUETEST TEST) test strip Use as instructed to check blood sugar twice daily dx 250.00 02/04/12   Rowe Clack, MD  Insulin Pen Needle (NOVOFINE) 32G X 6 MM MISC 1 Act by Does not apply route daily. Use 1 QD 02/01/15   Janith Lima, MD  TRUEPLUS LANCETS 28G MISC 1 each by Does not apply route 2 (two) times daily. Dx 250.00 02/04/12   Rowe Clack, MD   BP 157/86 mmHg  Pulse 113  Temp(Src) 99.1 F (37.3 C) (Oral)  Resp 19   SpO2 95% Physical Exam  Constitutional: He is oriented to person, place, and time. He appears well-developed. No distress.  HENT:  Head: Normocephalic and atraumatic.  Eyes: Conjunctivae and EOM are normal.  Cardiovascular: Regular rhythm and intact distal pulses.  Tachycardia present.   Pulmonary/Chest: Effort normal. No stridor. No respiratory distress.  Abdominal: He exhibits no distension.  Musculoskeletal: He exhibits no edema.       Feet:  Neurological: He is alert and oriented to person, place, and time.  Skin: Skin is warm and dry.  Psychiatric: He has a normal mood and affect.  Nursing note and vitals reviewed.   ED Course  Procedures (including critical care time) Labs Review Labs Reviewed  COMPREHENSIVE METABOLIC PANEL - Abnormal; Notable for the following:    Sodium 133 (*)    Glucose, Bld 180 (*)    ALT 14 (*)    All other components within normal limits  CBC WITH DIFFERENTIAL/PLATELET - Abnormal; Notable for the following:    WBC 14.7 (*)    Neutro Abs 13.0 (*)    All other components within normal limits  I-STAT CG4 LACTIC ACID, ED  I-STAT CG4 LACTIC ACID, ED     After the initial evaluation, tachycardia, Topamax immune system, given his diabetes, patient received broad-spectrum antibiotics for presumed recurrent osteomyelitis.   Imaging Review Dg Foot Complete Left  03/30/2015  CLINICAL DATA:  Osteomyelitis. EXAM: LEFT FOOT - COMPLETE 3+ VIEW COMPARISON:  03/15/2015. FINDINGS: Progressive erosive changes of the distal left fifth metatarsal and base of the proximal phalanx of left fifth digit. Findings consistent with progressive osteomyelitis and septic arthritis about the left fifth metatarsal phalangeal joint. No radiopaque foreign bodies. No evidence fracture. Diffuse degenerative change. IMPRESSION: Progressive erosive changes of the distal left fifth metatarsal and base of the proximal phalanx of the left fifth digit. Findings consist with progressive  osteomyelitis and septic arthritis. Electronically Signed   By: Marcello Moores  Register   On: 03/30/2015 13:32   I have personally reviewed and evaluated these images and lab results as part of my medical decision-making.  Chart review notable for recent admission with broad-spectrum antibiotics for cellulitis with septic arthritis.  On repeat exam the patient remains in similar condition.   MDM   This patient presents with concern for left foot lesion, now with increased drainage, erythema. Evaluation here consistent with persistent osteomyelitis. Patient required admission.  IV antibiotics started in the emergency room and continued on admission.  Here the evaluation is consistent with persistent osteomyelitis, in spite of using home antibiotics appropriately. Given the findings, the patient required admission for further continued IV antibiotics.   Carmin Muskrat, MD 03/30/15 1346

## 2015-03-31 LAB — GLUCOSE, CAPILLARY
GLUCOSE-CAPILLARY: 127 mg/dL — AB (ref 65–99)
Glucose-Capillary: 164 mg/dL — ABNORMAL HIGH (ref 65–99)
Glucose-Capillary: 167 mg/dL — ABNORMAL HIGH (ref 65–99)
Glucose-Capillary: 147 mg/dL — ABNORMAL HIGH (ref 65–99)

## 2015-03-31 NOTE — Progress Notes (Signed)
[facility] PROGRESS NOTE  [name] [id number] DOB: Aug 29, 1955 DOA: 03/30/2015 PCP: Scarlette Calico, MD  Assessment/Plan: 1.  Worsening progressive erosive osteomyelitis of the left foot: Admit to med surg and start him on empiric antibiotics, vancomycin and zosyn.  Vascular surgery consulted for evaluation of angiogram and aortogram.  Elevate leg.  Wet to dry dressings.  No more drainage from the foot today.   Diabetes mellitus: CBG (last 3)   Recent Labs  03/31/15 0713 03/31/15 1116 03/31/15 1621  GLUCAP 164* 167* 147*    Resume lantus and SSI.    PVD" resume aspirin, plavix and pletal.   Hypertension: Controlled.   Code Status: full code Family Communication: none at bedside Disposition Plan: transfer to [facility] in am.    Consultants:  Vascular surgery.   Procedures: none Antibiotics:  Vancomycin   Zosyn   HPI/Subjective: Pain has improved  Objective: Filed Vitals:   03/31/15 0418 03/31/15 1316  BP: 121/69 102/65  Pulse: 96 82  Temp: 99.2 F (37.3 C) 99.1 F (37.3 C)  Resp: 18 18    Intake/Output Summary (Last 24 hours) at 03/31/15 1801 Last data filed at 03/31/15 1635  Gross per 24 hour  Intake   1115 ml  Output      0 ml  Net   1115 ml   Filed Weights   03/30/15 1603  Weight: 86.4 kg (190 lb 7.6 oz)    Exam:   General:  Alert afebrile comfortable.   Cardiovascular: s1s2  Respiratory: ctab   Abdomen: soft NT ND BS+  Musculoskeletal: left foot ulcer, no drainage.   Data Reviewed: Basic Metabolic Panel:  Recent Labs Lab 03/30/15 1035  NA 133*  K 4.4  CL 101  CO2 23  GLUCOSE 180*  BUN 12  CREATININE 1.07  CALCIUM 9.5   Liver Function Tests:  Recent Labs Lab 03/30/15 1035  AST 16  ALT 14*  ALKPHOS 61  BILITOT 0.9  PROT 7.7  ALBUMIN 4.5   No results for input(s): LIPASE, AMYLASE in the last 168 hours. No results for input(s): AMMONIA in the last 168 hours. CBC:  Recent Labs Lab  03/30/15 1035  WBC 14.7*  NEUTROABS 13.0*  HGB 15.6  HCT 45.3  MCV 98.7  PLT 231   Cardiac Enzymes: No results for input(s): CKTOTAL, CKMB, CKMBINDEX, TROPONINI in the last 168 hours. BNP (last 3 results) No results for input(s): BNP in the last 8760 hours.  ProBNP (last 3 results) No results for input(s): PROBNP in the last 8760 hours.  CBG:  Recent Labs Lab 03/30/15 1712 03/30/15 2115 03/31/15 0713 03/31/15 1116 03/31/15 1621  GLUCAP 140* 104* 164* 167* 147*    No results found for this or any previous visit (from the past 240 hour(s)).   Studies: Dg Foot Complete Left  03/30/2015  CLINICAL DATA:  Osteomyelitis. EXAM: LEFT FOOT - COMPLETE 3+ VIEW COMPARISON:  03/15/2015. FINDINGS: Progressive erosive changes of the distal left fifth metatarsal and base of the proximal phalanx of left fifth digit. Findings consistent with progressive osteomyelitis and septic arthritis about the left fifth metatarsal phalangeal joint. No radiopaque foreign bodies. No evidence fracture. Diffuse degenerative change. IMPRESSION: Progressive erosive changes of the distal left fifth metatarsal and base of the proximal phalanx of the left fifth digit. Findings consist with progressive osteomyelitis and septic arthritis. Electronically Signed   By: [place]   On: 03/30/2015 13:32    Scheduled Meds: . aspirin EC  81 mg Oral Daily  .  cilostazol  100 mg Oral BID  . clopidogrel  75 mg Oral Daily  . enoxaparin (LOVENOX) injection  40 mg Subcutaneous Q24H  . insulin aspart  0-5 Units Subcutaneous QHS  . insulin aspart  0-9 Units Subcutaneous TID WC  . insulin glargine  55 Units Subcutaneous Daily  . isosorbide mononitrate  30 mg Oral BID  . metoprolol  75 mg Oral BID  . piperacillin-tazobactam (ZOSYN)  IV  3.375 g Intravenous 3 times per day  . pravastatin  80 mg Oral Daily  . vancomycin  1,000 mg Intravenous BID   Continuous Infusions:   Active Problems:   Osteomyelitis ([place])    Diabetic foot ([place])    Time spent: 25 minutes.     [facility] Pager [phone] . If 7PM-7AM, please contact night-coverage at [email or web address], password [facility] 03/31/2015, 6:01 PM  LOS: 1 day

## 2015-04-01 LAB — CBC
HEMATOCRIT: 33.2 % — AB (ref 39.0–52.0)
Hemoglobin: 11.7 g/dL — ABNORMAL LOW (ref 13.0–17.0)
MCH: 34.4 pg — ABNORMAL HIGH (ref 26.0–34.0)
MCHC: 35.2 g/dL (ref 30.0–36.0)
MCV: 97.6 fL (ref 78.0–100.0)
Platelets: 166 10*3/uL (ref 150–400)
RBC: 3.4 MIL/uL — ABNORMAL LOW (ref 4.22–5.81)
RDW: 15.4 % (ref 11.5–15.5)
WBC: 14 10*3/uL — ABNORMAL HIGH (ref 4.0–10.5)

## 2015-04-01 LAB — GLUCOSE, CAPILLARY
Glucose-Capillary: 144 mg/dL — ABNORMAL HIGH (ref 65–99)
Glucose-Capillary: 169 mg/dL — ABNORMAL HIGH (ref 65–99)

## 2015-04-01 NOTE — Progress Notes (Signed)
Awaiting transfer to [facility] for vascular eval.  Hopefull this can happen today   [name]

## 2015-04-01 NOTE — Progress Notes (Signed)
Patient left AMA.  Dr. Karleen Hampshire spoke with patient prior to leaving to explain any risks associated with leaving AMA.  Patient decided to leave, and was accompanied by his wife.

## 2015-04-02 ENCOUNTER — Encounter: Payer: Self-pay | Admitting: Cardiology

## 2015-04-02 ENCOUNTER — Other Ambulatory Visit: Payer: Self-pay | Admitting: *Deleted

## 2015-04-03 ENCOUNTER — Ambulatory Visit: Payer: Commercial Managed Care - HMO | Admitting: Pharmacist

## 2015-04-05 ENCOUNTER — Encounter (HOSPITAL_COMMUNITY)
Admission: RE | Disposition: A | Discharge status: Home / Self Care | Payer: Self-pay | Source: Ambulatory Visit | Attending: Vascular Surgery

## 2015-04-05 ENCOUNTER — Ambulatory Visit (HOSPITAL_COMMUNITY)
Admission: RE | Admit: 2015-04-05 | Discharge: 2015-04-05 | Disposition: A | Discharge status: Home / Self Care | Payer: Commercial Managed Care - HMO | Source: Ambulatory Visit | Attending: Vascular Surgery | Admitting: Vascular Surgery

## 2015-04-05 ENCOUNTER — Other Ambulatory Visit: Payer: Self-pay | Admitting: *Deleted

## 2015-04-05 DIAGNOSIS — IMO0002 Reserved for concepts with insufficient information to code with codable children: Secondary | ICD-10-CM | POA: Diagnosis present

## 2015-04-05 DIAGNOSIS — Z87891 Personal history of nicotine dependence: Secondary | ICD-10-CM | POA: Diagnosis not present

## 2015-04-05 DIAGNOSIS — L97529 Non-pressure chronic ulcer of other part of left foot with unspecified severity: Secondary | ICD-10-CM | POA: Insufficient documentation

## 2015-04-05 DIAGNOSIS — E1151 Type 2 diabetes mellitus with diabetic peripheral angiopathy without gangrene: Secondary | ICD-10-CM | POA: Diagnosis present

## 2015-04-05 DIAGNOSIS — Z79899 Other long term (current) drug therapy: Secondary | ICD-10-CM | POA: Insufficient documentation

## 2015-04-05 DIAGNOSIS — I10 Essential (primary) hypertension: Secondary | ICD-10-CM | POA: Insufficient documentation

## 2015-04-05 DIAGNOSIS — I252 Old myocardial infarction: Secondary | ICD-10-CM | POA: Insufficient documentation

## 2015-04-05 DIAGNOSIS — Z8249 Family history of ischemic heart disease and other diseases of the circulatory system: Secondary | ICD-10-CM | POA: Diagnosis not present

## 2015-04-05 DIAGNOSIS — Z794 Long term (current) use of insulin: Secondary | ICD-10-CM | POA: Diagnosis not present

## 2015-04-05 DIAGNOSIS — I7025 Atherosclerosis of native arteries of other extremities with ulceration: Secondary | ICD-10-CM

## 2015-04-05 DIAGNOSIS — E11621 Type 2 diabetes mellitus with foot ulcer: Secondary | ICD-10-CM | POA: Insufficient documentation

## 2015-04-05 DIAGNOSIS — I2510 Atherosclerotic heart disease of native coronary artery without angina pectoris: Secondary | ICD-10-CM | POA: Diagnosis not present

## 2015-04-05 DIAGNOSIS — E785 Hyperlipidemia, unspecified: Secondary | ICD-10-CM | POA: Insufficient documentation

## 2015-04-05 DIAGNOSIS — Z01810 Encounter for preprocedural cardiovascular examination: Secondary | ICD-10-CM

## 2015-04-05 DIAGNOSIS — I708 Atherosclerosis of other arteries: Secondary | ICD-10-CM | POA: Diagnosis not present

## 2015-04-05 DIAGNOSIS — I70245 Atherosclerosis of native arteries of left leg with ulceration of other part of foot: Secondary | ICD-10-CM | POA: Diagnosis present

## 2015-04-05 DIAGNOSIS — E1165 Type 2 diabetes mellitus with hyperglycemia: Secondary | ICD-10-CM

## 2015-04-05 DIAGNOSIS — I70244 Atherosclerosis of native arteries of left leg with ulceration of heel and midfoot: Secondary | ICD-10-CM | POA: Diagnosis not present

## 2015-04-05 DIAGNOSIS — Z7982 Long term (current) use of aspirin: Secondary | ICD-10-CM | POA: Diagnosis not present

## 2015-04-05 HISTORY — PX: PERIPHERAL VASCULAR CATHETERIZATION: SHX172C

## 2015-04-05 LAB — POCT I-STAT, CHEM 8
BUN: 17 mg/dL (ref 6–20)
CALCIUM ION: 1.16 mmol/L (ref 1.13–1.30)
Chloride: 103 mmol/L (ref 101–111)
Creatinine, Ser: 0.9 mg/dL (ref 0.61–1.24)
Glucose, Bld: 118 mg/dL — ABNORMAL HIGH (ref 65–99)
HCT: 39 % (ref 39.0–52.0)
HEMOGLOBIN: 13.3 g/dL (ref 13.0–17.0)
Potassium: 4 mmol/L (ref 3.5–5.1)
SODIUM: 139 mmol/L (ref 135–145)
TCO2: 24 mmol/L (ref 0–100)

## 2015-04-05 SURGERY — ABDOMINAL AORTOGRAM W/LOWER EXTREMITY
Anesthesia: LOCAL

## 2015-04-05 MED ORDER — MORPHINE SULFATE (PF) 2 MG/ML IV SOLN
2.0000 mg | INTRAVENOUS | Status: DC | PRN
Start: 2015-04-05 — End: 2015-04-05

## 2015-04-05 MED ORDER — FENTANYL CITRATE (PF) 100 MCG/2ML IJ SOLN
INTRAMUSCULAR | Status: DC | PRN
  Administered 2015-04-05: 25 ug via INTRAVENOUS

## 2015-04-05 MED ORDER — OXYCODONE-ACETAMINOPHEN 5-325 MG PO TABS: 1.0000 | ORAL_TABLET | Freq: Four times a day (QID) | ORAL | Status: DC | PRN

## 2015-04-05 MED ORDER — SODIUM CHLORIDE 0.9 % IV SOLN: 1.0000 mL/kg/h | INTRAVENOUS | Status: DC

## 2015-04-05 MED ORDER — LIDOCAINE HCL (PF) 1 % IJ SOLN
INTRAMUSCULAR | Status: AC
  Filled 2015-04-05: qty 30

## 2015-04-05 MED ORDER — FENTANYL CITRATE (PF) 100 MCG/2ML IJ SOLN
INTRAMUSCULAR | Status: AC
  Filled 2015-04-05: qty 2

## 2015-04-05 MED ORDER — HEPARIN (PORCINE) IN NACL 2-0.9 UNIT/ML-% IJ SOLN
INTRAMUSCULAR | Status: AC
  Filled 2015-04-05: qty 1000

## 2015-04-05 MED ORDER — MIDAZOLAM HCL 2 MG/2ML IJ SOLN
INTRAMUSCULAR | Status: DC | PRN
  Administered 2015-04-05: 0.5 mg via INTRAVENOUS

## 2015-04-05 MED ORDER — IODIXANOL 320 MG/ML IV SOLN
INTRAVENOUS | Status: DC | PRN
  Administered 2015-04-05: 110 mL via INTRA_ARTERIAL

## 2015-04-05 MED ORDER — MIDAZOLAM HCL 2 MG/2ML IJ SOLN
INTRAMUSCULAR | Status: AC
  Filled 2015-04-05: qty 2

## 2015-04-05 MED ORDER — LIDOCAINE HCL (PF) 1 % IJ SOLN
INTRAMUSCULAR | Status: DC | PRN
  Administered 2015-04-05: 11:00:00

## 2015-04-05 MED ORDER — SODIUM CHLORIDE 0.9 % IV SOLN
INTRAVENOUS | Status: DC
  Administered 2015-04-05: 10:00:00 via INTRAVENOUS

## 2015-04-05 SURGICAL SUPPLY — 14 items
CATH OMNI FLUSH 5F 65CM (CATHETERS) ×2 IMPLANT
CATH SOFT-VU 4F 65 STRAIGHT (CATHETERS) ×1 IMPLANT
CATH SOFT-VU STRAIGHT 4F 65CM (CATHETERS) ×1
COVER PRB 48X5XTLSCP FOLD TPE (BAG) ×1 IMPLANT
COVER PROBE 5X48 (BAG) ×1
DEVICE TORQUE H2O (MISCELLANEOUS) ×2 IMPLANT
GUIDEWIRE ANGLED .035X150CM (WIRE) ×2 IMPLANT
KIT MICROINTRODUCER STIFF 5F (SHEATH) ×2 IMPLANT
KIT PV (KITS) ×2 IMPLANT
SHEATH PINNACLE 5F 10CM (SHEATH) ×2 IMPLANT
SYR MEDRAD MARK V 150ML (SYRINGE) ×2 IMPLANT
TRANSDUCER W/STOPCOCK (MISCELLANEOUS) ×2 IMPLANT
TRAY PV CATH (CUSTOM PROCEDURE TRAY) ×2 IMPLANT
WIRE BENTSON .035X145CM (WIRE) ×2 IMPLANT

## 2015-04-05 NOTE — Discharge Instructions (Signed)

## 2015-04-05 NOTE — Interval H&P Note (Signed)
History and Physical Interval Note:  04/05/2015 7:34 AM  [name]  has presented today for surgery, with the diagnosis of pvd  The various methods of treatment have been discussed with the patient and family. After consideration of risks, benefits and other options for treatment, the patient has consented to  Procedure(s): Abdominal Aortogram w/Lower Extremity (N/A) as a surgical intervention .  The patient's history has been reviewed, patient examined, no change in status, stable for surgery.  I have reviewed the patient's chart and labs.  Questions were answered to the patient's satisfaction.     [name]

## 2015-04-05 NOTE — H&P (View-Only) (Signed)
Referred by:  [facility]  Reason for referral: Left foot ulcers  History of Present Illness  [name] is a 59 y.o. (05-21-1955) male known BLE intermittent claudication followed by Dr. Trula Slade who presents with chief complaint: L foot ulcer.  Patient notes onset of the ulcers a few months ago.  He reported had an X-ray yesterday and was told to come back to the ED for admission for IV abx.  The patient notes some hyperesthesia in bottom of his L foot but anesthesia in much of his L foot.  Pain is described as sharp, severity 5-10/10, and associated with manipulation of L foot.  Patient has attempted to treat this pain with rest.  The patient has no rest pain symptoms also.  He denies any drainage from the ulcer but does note a low grade temperature overnight. The patient notes a prior ulcer in his right foot healed spontaneously.  Atherosclerotic risk factors include: DM, smoking.  Past Medical History  Diagnosis Date  . Atherosclerosis of native arteries of the extremities with rest pain   . Coronary atherosclerosis of native coronary artery   . PAD (peripheral artery disease) ([place])   . DM type 2 (diabetes mellitus, type 2) ([place])   . Other and unspecified hyperlipidemia   . Unspecified essential hypertension   . Tobacco use disorder   . H/O alcohol abuse     quit 2011 - dry since that time  . H/O: obesity     lost 100lbs  . Non Hodgkin's lymphoma ([place]) 2000    tx with XRT at [facility], in remission  . Myocardial infarction [facility]) June 9,2009  . Osteoarthritis     right hip  . History of herniated intervertebral disc   . Osteoarthritis     Past Surgical History  Procedure Laterality Date  . Cardiac catheterization      2011  . Leg surgery      on bone above ankle-not sure which bone left leg 1981  . Multiple extractions with alveoloplasty  10/31/2011    Procedure: MULTIPLE EXTRACION WITH ALVEOLOPLASTY;  Surgeon: Lenn Cal, DDS;  Location: [facility];   Service: Oral Surgery;  Laterality: N/A;  Extraction of tooth #'s 2, 4,5,6,7,8,9,10,11,12,13,15,21,22,23,24,25,26,27,28, 29, and 31 with alveoloplasty  . Leg surgery  1981    Bone cyst- Left leg    Social History   Social History  . Marital Status: Married    Spouse Name: N/A  . Number of Children: N/A  . Years of Education: N/A   Occupational History  .      Disabled. Former Biomedical scientist.   Social History Main Topics  . Smoking status: Former Smoker -- 0.50 packs/day for 43 years    Types: Cigarettes  . Smokeless tobacco: Never Used     Comment: heavy 2nd hand exposue ongoing  . Alcohol Use: No     Comment: former heavy user quit 2011  . Drug Use: No  . Sexual Activity: Not on file   Other Topics Concern  . Not on file   Social History Narrative   The patient is married. The patient has no children.   Patient with a history of smoking one pack per day for 43 years. Patient started smoking at the age of 84.   Patient no longer drinks alcohol. Patient quit proximally one and a half years ago from a previously heavy alcohol use.    Family History  Problem Relation Age of Onset  . Heart  attack Mother     59's  . Heart disease Mother     Heart disease before age 30  . Hypertension Mother   . Hyperlipidemia Mother   . Peripheral Artery Disease Father   . Hypertension Father   . Hyperlipidemia Father   . Peripheral vascular disease Father   . Heart attack      40's/40's    Current Facility-Administered Medications  Medication Dose Route Frequency Provider Last Rate Last Dose  . 0.9 %  sodium chloride infusion   Intravenous Continuous Robbie Lis, MD      . acetaminophen (TYLENOL) tablet 650 mg  650 mg Oral Q6H PRN Robbie Lis, MD       Or  . acetaminophen (TYLENOL) suppository 650 mg  650 mg Rectal Q6H PRN Robbie Lis, MD      . aspirin tablet 81 mg  81 mg Oral Daily Robbie Lis, MD      . cilostazol (PLETAL) tablet 100 mg  100 mg Oral BID Robbie Lis, MD        . clopidogrel (PLAVIX) tablet 75 mg  75 mg Oral Daily Robbie Lis, MD      . Derrill Memo ON 03/17/2015] glimepiride (AMARYL) tablet 1 mg  1 mg Oral Q breakfast Robbie Lis, MD      . insulin glargine (LANTUS) injection 50 Units  50 Units Subcutaneous Q1200 Robbie Lis, MD      . isosorbide mononitrate (IMDUR) 24 hr tablet 30 mg  30 mg Oral BID Robbie Lis, MD      . lisinopril (PRINIVIL,ZESTRIL) tablet 40 mg  40 mg Oral Daily Robbie Lis, MD      . metFORMIN (GLUCOPHAGE-XR) 24 hr tablet 1,000 mg  1,000 mg Oral BID WC Robbie Lis, MD      . metoprolol (LOPRESSOR) tablet 50 mg  50 mg Oral BID Robbie Lis, MD      . Derrill Memo ON 03/17/2015] omega-3 acid ethyl esters (LOVAZA) capsule 1 g  1 g Oral Daily Robbie Lis, MD      . ondansetron [facility]) tablet 4 mg  4 mg Oral Q6H PRN Robbie Lis, MD       Or  . ondansetron [facility]) injection 4 mg  4 mg Intravenous Q6H PRN Robbie Lis, MD      . pravastatin (PRAVACHOL) tablet 80 mg  80 mg Oral Daily Robbie Lis, MD        Allergies  Allergen Reactions  . Actos [Pioglitazone]     Rash and edema   . Fenofibrate Other (See Comments)    Muscle weakness/cramping/burning  . Chlorhexidine Gluconate Rash  . Orange Oil Rash  . Soap Other (See Comments)    States that most soaps make him break out/rash-he uses a sensitive skin soap and shampoos as well, has to use specific allergen free soap and shampoo     REVIEW OF SYSTEMS:  (Positives checked otherwise negative)  CARDIOVASCULAR:   [ ]  chest pain,  [ ]  chest pressure,  [ ]  palpitations,  [ ]  shortness of breath when laying flat,  [ ]  shortness of breath with exertion,   [x]  pain in feet when walking,  [ ]  pain in feet when laying flat, [ ]  history of blood clot in veins (DVT),  [ ]  history of phlebitis,  [ ]  swelling in legs,  [ ]  varicose veins  PULMONARY:   [ ]  productive  cough,  [ ]  asthma,  [ ]  wheezing  NEUROLOGIC:   [ ]  weakness in arms or legs,  [ ]  numbness in  arms or legs,  [ ]  difficulty speaking or slurred speech,  [ ]  temporary loss of vision in one eye,  [ ]  dizziness  HEMATOLOGIC:   [ ]  bleeding problems,  [ ]  problems with blood clotting too easily  MUSCULOSKEL:   [ ]  joint pain, [ ]  joint swelling  GASTROINTEST:   [ ]  vomiting blood,  [ ]  blood in stool     GENITOURINARY:   [ ]  burning with urination,  [ ]  blood in urine  PSYCHIATRIC:   [ ]  history of major depression  INTEGUMENTARY:   [ ]  rashes,  [x]  ulcers  CONSTITUTIONAL:   [ ]  fever,  [ ]  chills   For VQI Use Only  PRE-ADM LIVING: Home  AMB STATUS: Ambulatory  CAD Sx: History of MI, but no symptoms No MI within 6 months  PRIOR CHF: None  STRESS TEST: [x]  No, [ ]  Normal, [ ]  + ischemia, [ ]  + MI, [ ]  Both   Physical Examination  Filed Vitals:   03/16/15 0921 03/16/15 1148 03/16/15 1207 03/16/15 1517  BP: 165/85 163/84  177/84  Pulse: 80 78  77  Temp: 97.8 F (36.6 C)  98 F (36.7 C) 98.3 F (36.8 C)  TempSrc: Oral  Oral Oral  Resp: 16 16  18   Height:    5\' 9"  (1.753 m)  Weight:    188 lb 15 oz (85.7 kg)  SpO2: 98% 94%  98%   Body mass index is 27.89 kg/(m^2).  General: A&O x 3, WD, WN  Head: /AT  Ear/Nose/Throat: Hearing grossly intact, nares w/o erythema or drainage, oropharynx w/o Erythema/Exudate, Mallampati score: 3  Eyes: PERRLA, EOMI  Neck: Supple, no nuchal rigidity, no palpable LAD  Pulmonary: Sym exp, good air movt, CTAB, no rales, rhonchi, & wheezing  Cardiac: RRR, Nl S1, S2, no Murmurs, rubs or gallops  Vascular: Vessel Right Left  Radial Palpable Palpable  Brachial Palpable Palpable  Carotid Palpable, without bruit Palpable, without bruit  Aorta Not palpable N/A  Femoral Palpable Palpable  Popliteal Not palpable Not palpable  PT Not Palpable Not Palpable  DP Not Palpable Not Palpable   Gastrointestinal: soft, NTND, -G/R, - HSM, - masses, - CVAT B  Musculoskeletal: M/S 5/5 throughout with intact DF/PF,  Extremities without ischemic changes, L lateral 5th MT ulcer, no drainage, some necrotic tissue in ulcer, no erythema in fore foot  Neurologic: CN 2-12 intact , Pain and light touch intact in extremities except anesthesia from L knee down, Motor exam as listed above  Psychiatric: Judgment intact, Mood & affect appropriate for pt's clinical situation  Dermatologic: See M/S exam for extremity exam, no rashes otherwise noted  Lymph : No Cervical, Axillary, or Inguinal lymphadenopathy   Laboratory: CBC:    Component Value Date/Time   WBC 8.7 03/16/2015 0947   RBC 4.24 03/16/2015 0947   HGB 14.1 03/16/2015 0947   HCT 41.0 03/16/2015 0947   PLT 238 03/16/2015 0947   MCV 96.7 03/16/2015 0947   MCH 33.3 03/16/2015 0947   MCHC 34.4 03/16/2015 0947   RDW 14.2 03/16/2015 0947   LYMPHSABS 2.5 03/16/2015 0947   MONOABS 0.7 03/16/2015 0947   EOSABS 0.5 03/16/2015 0947   BASOSABS 0.0 03/16/2015 0947    BMP:    Component Value Date/Time   NA 137 03/16/2015 0947  K 4.7 03/16/2015 0947   CL 103 03/16/2015 0947   CO2 26 03/16/2015 0947   GLUCOSE 127* 03/16/2015 0947   BUN 13 03/16/2015 0947   CREATININE 1.03 03/16/2015 0947   CALCIUM 9.5 03/16/2015 0947   GFRNONAA >60 03/16/2015 0947   GFRAA >60 03/16/2015 0947    Coagulation: Lab Results  Component Value Date   INR 1.0 ratio 06/11/2009   INR 0.9 10/13/2007   No results found for: PTT  Lipids:    Component Value Date/Time   CHOL 228* 01/30/2015 1018   TRIG * 01/30/2015 1018    1324.0 Triglyceride is over 400; calculations on Lipids are invalid.   HDL 30.00* 01/30/2015 1018   CHOLHDL 8 01/30/2015 1018   VLDL 269.4* 02/20/2014 1103   LDLCALC 35 09/19/2013 0829   LDLDIRECT 71.0 01/30/2015 1018   Radiology: Dg Foot Complete Left  03/15/2015  CLINICAL DATA:  Diabetic foot ulcer on the bottom of the foot near the fifth toe, chronically open wound EXAM: LEFT FOOT - COMPLETE 3+ VIEW COMPARISON:  Left foot series of November 08, 2014 FINDINGS: Since the previous study there has developed ostial ice is of the base of the proximal phalanx of the great toe and of the head of the fifth metatarsal. The joint space does not appear abnormally widened. More distally the phalanges of the left fifth toe are unremarkable. The first through fourth toes as well as first through fourth metatarsals exhibit no acute abnormalities. There is mild soft tissue swelling over the fifth toe with evidence of a cutaneous wound. IMPRESSION: Findings consistent with osteomyelitis involving the head of the fifth metatarsal and the base of the proximal phalanx of the fifth toe. The MTP joint space is not abnormally widened, but septic arthritis is likely present as well. Electronically Signed   By: David  Martinique M.D.   On: 03/15/2015 11:15    Non-Invasive Vascular Imaging  ABI (Date: 03/16/2015) ordered   Medical Decision Making  [name] is a 59 y.o. male who presents with: LLE critical limb ischemia,, known moderate RLE PAD, uncontrolled DM, L 5th MT osteomyelitis    I doubt this patient is at risk for acute limb loss.  He has intact motor with no evidence of threatened limb in the left side.  His anesthesia in that L foot is long lasting and likely related to his DM.  He will likely need a L 5th ray toe amputation for the osteomyelitis in L foot but optimization of blood flow in that foot would help him heal the amputation.  Will obtain BLE ABI with TBI to set this patient's baseline preoperatively.  If his TBI is normal on the L, he may be able to heal without further revascularization.  I doubt he will have a normal TBI.  Will arrange for: Aortogram, bilateral leg runoff, and possible left leg intervention with Dr. Trula Slade for this coming Tuesday.  I discussed in depth with the patient the nature of atherosclerosis, and emphasized the importance of maximal medical management including strict control of blood pressure, blood glucose,  and lipid levels, antiplatelet agents, obtaining regular exercise, and cessation of smoking.  The patient is aware that without maximal medical management the underlying atherosclerotic disease process will progress, limiting the benefit of any interventions. The patient is currently on a statin:  Pravachol. The patient is currently on an anti-platelet: Plavix. Continue abx per IM. Will continue to follow this patient with you.  Thank you for allowing [place]  to participate in this patient's care.   [name], MD [facility] [place] Office: [phone] Pager: [phone]  03/16/2015, 3:31 PM    Addendum    RIGHT    LEFT    PRESSURE WAVEFORM  PRESSURE WAVEFORM  BRACHIAL 186 Tri BRACHIAL 177 Tri  DP   DP    AT 99 Mono AT 60 Damp mono  PT 104 Damp mono PT 80 Damp mono  PER   PER    GREAT TOE  NA GREAT TOE  NA    RIGHT LEFT  ABI 0.56 0.43        ABI demonstrate some mild decrease B consistent with moderate to severe disease.  Unfortunately, TBI not done.  Would proceed with angiographic imaging on Tuesday with Dr. Trula Slade to determine revascularization options.   [name], MD [facility] [place] Office: [phone] Pager: [phone]  03/16/2015, 3:52 PM

## 2015-04-06 ENCOUNTER — Encounter (HOSPITAL_COMMUNITY): Payer: Self-pay | Admitting: Vascular Surgery

## 2015-04-09 ENCOUNTER — Telehealth: Payer: Self-pay

## 2015-04-09 NOTE — Telephone Encounter (Signed)
Phone call from pt.  Reported his left leg is getting worse.  Reported increased pain in left ankle and calf muscle "all the time".  Stated the left lower extremity swelling is improved, but stated there is still swelling in foot and ankle.  Stated the drainage from left foot toes is a slight amt. of thin, bloody drainage.  Also reported "low grade" fever of 98.9; stated his temp. Is usually 97 degrees.  Advised will report symptoms to Dr. Bridgett Larsson, and will call back with recommendations.  Agreed with plan.

## 2015-04-09 NOTE — Telephone Encounter (Signed)
Unfortunately, we are booked up on the 12th- I think we would have to schedule him to come back for the ultrasound if he decides to go through with it, or possibly ask Juliann Pulse if there is room to work him in on the day he is here.

## 2015-04-09 NOTE — Telephone Encounter (Signed)
RE: need recommendation for office appt. vs scheduling surgery  Received: Today    Randall Startex, MD  [name] , RN           Needs to complete his preop work-up: cardiology c/s and GSV mapping. He need a fem-pop bypass.     Called pt.  Explained recommendation for vein mapping of vessels in lower extremities and cardiac clearance, prior to scheduling left leg bypass.  Stated he does not want any more tests done until he is able to speak to the surgeon, due to his out of pocket expense.  Noted pt. Has appt. For GSV vein mapping 04/12/15, and appt. with Dr. Trula Slade on 04/16/15.   Will make Dr. Trula Slade aware that the pt. Doesn't want to proceed with vein mapping until he sees him.  Also will send msg. To Dr. Aundra Dubin for note of cardiac clearance.

## 2015-04-10 ENCOUNTER — Telehealth: Payer: Self-pay | Admitting: Vascular Surgery

## 2015-04-10 ENCOUNTER — Encounter: Payer: Self-pay | Admitting: Surgery

## 2015-04-10 NOTE — Telephone Encounter (Signed)
-----   Message from Mena Goes, RN sent at 04/05/2015 12:04 PM EST ----- Regarding: schedule   ----- Message -----    From: Conrad Whitefish, MD    Sent: 04/05/2015  11:43 AM      To: [facility] [address]  [name] [id number] 15-Oct-1955  PROCEDURE: 1. Right common femoral artery cannulation under ultrasound guidance 2. Placement of catheter in aorta 3. Aortogram 4. Second order arterial selection 5. Left left runoff via catheter 6. Right left runoff via sheath  Follow-up: Dr. Trula Slade 1-2 weeks  Orders(s) for follow-up: BLE GSV vein mapping

## 2015-04-10 NOTE — Telephone Encounter (Signed)
Patient refused the study- see tele enc from RN- but he is aware of the appointment with [facility], dpm

## 2015-04-10 NOTE — Discharge Summary (Signed)
Patient left AMA ON 11/27. HE refused all medications.  Discussed with patient's wife and tried to persuade him, but he refused to stay for the procedure or any medications an dleft AMA. Notified Dr Maple Mirza, MD [phone]

## 2015-04-12 ENCOUNTER — Encounter (HOSPITAL_COMMUNITY): Payer: Commercial Managed Care - HMO

## 2015-04-16 ENCOUNTER — Encounter: Payer: Self-pay | Admitting: Surgery

## 2015-04-16 ENCOUNTER — Ambulatory Visit (INDEPENDENT_AMBULATORY_CARE_PROVIDER_SITE_OTHER): Payer: Commercial Managed Care - HMO | Admitting: Surgery

## 2015-04-16 ENCOUNTER — Encounter: Payer: Self-pay | Admitting: *Deleted

## 2015-04-16 ENCOUNTER — Telehealth: Payer: Self-pay | Admitting: *Deleted

## 2015-04-16 VITALS — BP 138/72 | HR 82 | Temp 98.4°F | Ht 70.0 in | Wt 186.6 lb

## 2015-04-16 DIAGNOSIS — I739 Peripheral vascular disease, unspecified: Secondary | ICD-10-CM

## 2015-04-16 DIAGNOSIS — I7025 Atherosclerosis of native arteries of other extremities with ulceration: Secondary | ICD-10-CM

## 2015-04-16 DIAGNOSIS — I2510 Atherosclerotic heart disease of native coronary artery without angina pectoris: Secondary | ICD-10-CM

## 2015-04-16 NOTE — Telephone Encounter (Signed)
-----   Message from Larey Dresser, MD sent at 04/15/2015  9:05 PM EST ----- Regarding: RE: need for cardiac clearance Given history and minimal ambulation, he probably should have a Cardiolite prior to vascular surgery.    Webb Silversmith, can you arrange? Thanks.   ----- Message -----    From: Denman George, RN    Sent: 04/09/2015   3:13 PM      To: Larey Dresser, MD Subject: need for cardiac clearance                     This pt. is scheduled to see Dr. Trula Slade, and have GSV vein mapping 04/16/15, to discuss and schedule Left Fem-Pop BP.  From your office evaluation 03/27/15, are you able to clear him from a cardiac standpoint?

## 2015-04-16 NOTE — Progress Notes (Signed)
Patient name: [name] MRN: [id number] DOB: 11/08/1955 Sex: male     Chief Complaint  Patient presents with  . Re-evaluation    1-2 wk f/u - vein mapping cancelled    HISTORY OF PRESENT ILLNESS: The patient is back for follow-up of his left lower extremity ulcers, which began several months ago.  He has been in and out of the hospital for IV antibiotics.  He underwent angiography by Dr. Bridgett Larsson on December 1.  This showed occlusive disease involving the superficial femoral and popliteal artery.  Patient is here today to discuss surgical options.  The patient has significant cardiac history.  He is scheduled to get a Myoview for cardiac clearance.  He has hypercholesterolemia which is managed with a statin.  His blood pressures controlled with an ACE inhibitor.  He also takes dual antiplatelet therapy with aspirin and Plavix, however he has held his Plavix because of his septic arthritis in his foot  Past Medical History  Diagnosis Date  . CAD, NATIVE VESSEL 04/16/2008    Qualifier: Diagnosis of  By: Aundra Dubin, MD, [place]    . Essential hypertension 04/16/2008    Qualifier: Diagnosis of  By: Aundra Dubin, MD, [place]    . Hyperlipidemia with target LDL less than 100 04/16/2008        . Hypertriglyceridemia 03/26/2014  . PAD (peripheral artery disease) ([place]) 03/26/2014  . Peripheral vascular disease, unspecified ([place]) 11/17/2011  . Type 2 diabetes mellitus with vascular disease ([place]) 06/22/2009    Qualifier: Diagnosis of  By: Karrie Meres RN, BSN, [place]    . Uncontrolled diabetes mellitus type 2 with peripheral artery disease ([place]) 03/16/2015  . Acute osteomyelitis of left foot ([place]) 03/16/2015  . Cellulitis and abscess of leg, except foot 02/21/2015  . Colon cancer screening 01/30/2015  . Diabetic ulcer of left foot with necrosis of muscle ([place]) 02/21/2015  . Dyslipidemia associated with type 2 diabetes mellitus ([place]) 03/16/2015  . Encounter for long-term (current) use of other medications  10/16/2011  . Left carotid bruit 12/11/2014  . Osteoarthritis of right hip 04/09/2012  . Septic arthritis of left foot ([place]) 03/16/2015    Past Surgical History  Procedure Laterality Date  . Cardiac catheterization      2011  . Leg surgery      on bone above ankle-not sure which bone left leg 1981  . Multiple extractions with alveoloplasty  10/31/2011    Procedure: MULTIPLE EXTRACION WITH ALVEOLOPLASTY;  Surgeon: Lenn Cal, DDS;  Location: [facility];  Service: Oral Surgery;  Laterality: N/A;  Extraction of tooth #'s 2, 4,5,6,7,8,9,10,11,12,13,15,21,22,23,24,25,26,27,28, 29, and 31 with alveoloplasty  . Leg surgery  1981    Bone cyst- Left leg  . Peripheral vascular catheterization N/A 04/05/2015    Procedure: Abdominal Aortogram w/Lower Extremity;  Surgeon: Conrad St. Vincent, MD;  Location: [facility];  Service: Cardiovascular;  Laterality: N/A;    Social History   Social History  . Marital Status: Married    Spouse Name: N/A  . Number of Children: N/A  . Years of Education: N/A   Occupational History  .      Disabled. Former Biomedical scientist.   Social History Main Topics  . Smoking status: Former Smoker -- 0.50 packs/day for 43 years    Types: Cigarettes  . Smokeless tobacco: Never Used     Comment: heavy 2nd hand exposue ongoing  . Alcohol Use: No     Comment: former heavy user quit 2011  .  Drug Use: No  . Sexual Activity: Not on file   Other Topics Concern  . Not on file   Social History Narrative   The patient is married. The patient has no children.   Patient with a history of smoking one pack per day for 43 years. Patient started smoking at the age of 67.   Patient no longer drinks alcohol. Patient quit proximally one and a half years ago from a previously heavy alcohol use.    Family History  Problem Relation Age of Onset  . Heart attack Mother     63's  . Heart disease Mother     Heart disease before age 17  . Hypertension Mother   . Hyperlipidemia Mother   .  Peripheral Artery Disease Father   . Hypertension Father   . Hyperlipidemia Father   . Peripheral vascular disease Father   . Heart attack      40's/40's    Allergies as of 04/16/2015 - Review Complete 04/16/2015  Allergen Reaction Noted  . Actos [pioglitazone]  10/13/2011  . Fenofibrate Other (See Comments) 02/04/2012  . Chlorhexidine gluconate Rash 10/31/2011  . Orange oil Rash 01/19/2012  . Soap Other (See Comments) 10/28/2011    Current Outpatient Prescriptions on File Prior to Visit  Medication Sig Dispense Refill  . aspirin 81 MG tablet Take 81 mg by mouth daily.    Marland Kitchen aspirin EC 325 MG tablet Take 975 mg by mouth every 4 (four) hours as needed for mild pain.     . cilostazol (PLETAL) 100 MG tablet Take 1 tablet (100 mg total) by mouth 2 (two) times daily. 180 tablet 3  . clopidogrel (PLAVIX) 75 MG tablet Take 1 tablet (75 mg total) by mouth daily. 90 tablet 2  . glimepiride (AMARYL) 1 MG tablet Take 1 tablet (1 mg total) by mouth daily with breakfast. 90 tablet 1  . glucose blood (TRUETEST TEST) test strip Use as instructed to check blood sugar twice daily dx 250.00 200 each 3  . Icosapent Ethyl (VASCEPA) 1 G CAPS Take 2,000 mg by mouth.    . Insulin Glargine (TOUJEO SOLOSTAR) 300 UNIT/ML SOPN Inject 55 Units into the skin daily. (Patient taking differently: Inject 50 Units into the skin daily. ) 1.5 mL 11  . Insulin Pen Needle (NOVOFINE) 32G X 6 MM MISC 1 Act by Does not apply route daily. Use 1 QD 100 each 3  . isosorbide mononitrate (IMDUR) 30 MG 24 hr tablet Take 1 tablet (30 mg total) by mouth 2 (two) times daily. 180 tablet 3  . lisinopril (PRINIVIL,ZESTRIL) 40 MG tablet Take 1 tablet (40 mg total) by mouth daily. 90 tablet 2  . metFORMIN (GLUCOPHAGE) 500 MG tablet Take 2 tablets (1,000 mg total) by mouth 2 (two) times daily with a meal. 360 tablet 1  . metoprolol (LOPRESSOR) 50 MG tablet Take 50 mg by mouth 2 (two) times daily.    . naproxen (NAPROSYN) 375 MG tablet Take  375 mg by mouth 2 (two) times daily with a meal.    . pravastatin (PRAVACHOL) 80 MG tablet Take 1 tablet (80 mg total) by mouth daily. 90 tablet 3  . TRUEPLUS LANCETS 28G MISC 1 each by Does not apply route 2 (two) times daily. Dx 250.00 200 each 3   No current facility-administered medications on file prior to visit.     REVIEW OF SYSTEMS: REVIEW OF SYSTEMS: (Positives checked otherwise negative)  CARDIOVASCULAR:  [ ]  chest pain,  [ ]   chest pressure,  [ ]  palpitations,  [ ]  shortness of breath when laying flat,  [ ]  shortness of breath with exertion,  [x]  pain in feet when walking,  [ ]  pain in feet when laying flat, [ ]  history of blood clot in veins (DVT),  [ ]  history of phlebitis,  [ ]  swelling in legs,  [ ]  varicose veins  PULMONARY:  [ ]  productive cough,  [ ]  asthma,  [ ]  wheezing  NEUROLOGIC:  [ ]  weakness in arms or legs,  [ ]  numbness in arms or legs,  [ ]  difficulty speaking or slurred speech,  [ ]  temporary loss of vision in one eye,  [ ]  dizziness  HEMATOLOGIC:  [ ]  bleeding problems,  [ ]  problems with blood clotting too easily  MUSCULOSKEL:  [ ]  joint pain, [ ]  joint swelling  GASTROINTEST:  [ ]  vomiting blood,  [ ]  blood in stool   GENITOURINARY:  [ ]  burning with urination,  [ ]  blood in urine  PSYCHIATRIC:  [ ]  history of major depression  INTEGUMENTARY:  [ ]  rashes,  [x]  ulcers  CONSTITUTIONAL:  [ ]  fever,  [ ]  chills   PHYSICAL EXAMINATION:   Vital signs are  Filed Vitals:   04/16/15 1028 04/16/15 1031  BP: 146/77 138/72  Pulse: 82   Temp: 98.4 F (36.9 C)   TempSrc: Oral   Height: 5\' 10"  (1.778 m)   Weight: 186 lb 9.6 oz (84.641 kg)   SpO2: 98%    Body mass index is 26.77 kg/(m^2). General: The patient appears their stated age. HEENT:  No gross abnormalities Pulmonary:  Non labored breathing Abdomen: Soft and non-tender Musculoskeletal: There are no major  deformities. Neurologic: No focal weakness or paresthesias are detected, Skin: There are no ulcer or rashes noted. Psychiatric: The patient has normal affect. Cardiovascular: There is a regular rate and rhythm without significant murmur appreciated.  Open wound at the base of the left fifth metatarsal head.  There is also an eschar on the lateral left calf and medial foot.   Diagnostic Studies None  Assessment: Left leg ulcer Plan: This is a limb threatening situation.  The patient has active wound on his left foot as well as erythema going up to the mid calf despite IV antibiotics and several hospital admissions.  I have discussed proceeding with left femoral anterior tibial bypass graft for limb salvage.  I discussed that even with revascularization he will still be at risk for limb loss until his wounds have healed.  He also may require amputation of the left fifth toe because of the infection and the metatarsal head.  I stressed the importance of this procedure.  The patient is contemplating going straight to a below knee amputation.  He will need a Myoview for cardiac clearance.  He wants to get this done after the holidays.  I will try to get this on the schedule.  He'll need to be off of his Plavix for at least 5 days.  Eldridge Abrahams, M.D. [facility] Office: [phone] Pager:  [phone]

## 2015-04-16 NOTE — Telephone Encounter (Signed)
Pt advised Dr Aundra Dubin recommends myoview prior to [facility] surgery. Instructions for myoview discussed with pt, will ask [facility] to contact pt to arrange myoview appt prior to PV surgery.

## 2015-04-16 NOTE — Addendum Note (Signed)
Addended by: Katrine Coho on: 04/16/2015 11:52 AM   Modules accepted: Orders

## 2015-04-18 ENCOUNTER — Telehealth (HOSPITAL_COMMUNITY): Payer: Self-pay | Admitting: *Deleted

## 2015-04-18 NOTE — Telephone Encounter (Signed)
Left message on voicemail in reference to upcoming appointment scheduled for 04/24/15. Phone number given for a call back so details instructions can be given. Hubbard Robinson, RN

## 2015-04-18 NOTE — Telephone Encounter (Signed)
Patient given detailed instructions per Myocardial Perfusion Study Information Sheet for the test on 04/24/15 at 945. Patient notified to arrive 15 minutes early and that it is imperative to arrive on time for appointment to keep from having the test rescheduled.  If you need to cancel or reschedule your appointment, please call the office within 24 hours of your appointment. Failure to do so may result in a cancellation of your appointment, and a $50 no show fee. Patient verbalized understanding. J , RN  

## 2015-04-19 ENCOUNTER — Telehealth: Payer: Self-pay | Admitting: Cardiology

## 2015-04-19 ENCOUNTER — Other Ambulatory Visit: Payer: Self-pay

## 2015-04-19 NOTE — Telephone Encounter (Signed)
I have forwarded  this information to Dr Shary Decamp.

## 2015-04-19 NOTE — Telephone Encounter (Signed)
Pt is scheduled for a myoview 04/24/15--Per Dr McLean-surgical clearance will be based on the myoview results.

## 2015-04-19 NOTE — Telephone Encounter (Signed)
New message      Request for surgical clearance:  What type of surgery is being performed? Femoral to tibial bypass 1. When is this surgery scheduled? 05-04-15  2. Are there any medications that need to be held prior to surgery and how long? Hold plavix 5 days prior  3. Name of physician performing surgery? Dr Trula Slade  4. What is your office phone and fax number? Fax [phone]

## 2015-04-24 ENCOUNTER — Ambulatory Visit (HOSPITAL_COMMUNITY): Payer: Commercial Managed Care - HMO | Attending: Internal Medicine

## 2015-04-24 ENCOUNTER — Encounter (HOSPITAL_BASED_OUTPATIENT_CLINIC_OR_DEPARTMENT_OTHER): Payer: Commercial Managed Care - HMO

## 2015-04-24 DIAGNOSIS — E119 Type 2 diabetes mellitus without complications: Secondary | ICD-10-CM | POA: Insufficient documentation

## 2015-04-24 DIAGNOSIS — R9439 Abnormal result of other cardiovascular function study: Secondary | ICD-10-CM | POA: Insufficient documentation

## 2015-04-24 DIAGNOSIS — I10 Essential (primary) hypertension: Secondary | ICD-10-CM | POA: Insufficient documentation

## 2015-04-24 DIAGNOSIS — I2510 Atherosclerotic heart disease of native coronary artery without angina pectoris: Secondary | ICD-10-CM

## 2015-04-24 DIAGNOSIS — I739 Peripheral vascular disease, unspecified: Secondary | ICD-10-CM | POA: Insufficient documentation

## 2015-04-24 LAB — MYOCARDIAL PERFUSION IMAGING
CHL CUP NUCLEAR SSS: 21
CHL CUP RESTING HR STRESS: 81 {beats}/min
CSEPPHR: 104 {beats}/min
LV dias vol: 108 mL
LVSYSVOL: 59 mL
NUC STRESS TID: 0.94
RATE: 0.38
SDS: 7
SRS: 14

## 2015-04-24 MED ORDER — REGADENOSON 0.4 MG/5ML IV SOLN
0.4000 mg | Freq: Once | INTRAVENOUS | Status: AC
  Administered 2015-04-24: 0.4 mg via INTRAVENOUS

## 2015-04-24 MED ORDER — TECHNETIUM TC 99M SESTAMIBI GENERIC - CARDIOLITE
10.5000 | Freq: Once | INTRAVENOUS | Status: AC | PRN
  Administered 2015-04-24: 11 via INTRAVENOUS

## 2015-04-24 MED ORDER — TECHNETIUM TC 99M SESTAMIBI GENERIC - CARDIOLITE
32.6000 | Freq: Once | INTRAVENOUS | Status: AC | PRN
  Administered 2015-04-24: 33 via INTRAVENOUS

## 2015-04-24 NOTE — Telephone Encounter (Signed)
He is cleared for surgery from cardiac perspective.

## 2015-04-25 NOTE — Telephone Encounter (Signed)
Message left on [facility] clinical pool voicemail and phone note routed to Dr. Trula Slade

## 2015-04-26 ENCOUNTER — Encounter (HOSPITAL_COMMUNITY)
Admission: RE | Admit: 2015-04-26 | Discharge: 2015-04-26 | Disposition: A | Discharge status: Home / Self Care | Payer: Commercial Managed Care - HMO | Source: Ambulatory Visit | Attending: Anesthesiology | Admitting: Anesthesiology

## 2015-04-26 ENCOUNTER — Encounter (HOSPITAL_COMMUNITY): Payer: Self-pay

## 2015-04-26 ENCOUNTER — Encounter (HOSPITAL_COMMUNITY)
Admission: RE | Admit: 2015-04-26 | Discharge: 2015-04-26 | Disposition: A | Discharge status: Home / Self Care | Payer: Commercial Managed Care - HMO | Source: Ambulatory Visit | Attending: Surgery | Admitting: Surgery

## 2015-04-26 DIAGNOSIS — I252 Old myocardial infarction: Secondary | ICD-10-CM | POA: Diagnosis not present

## 2015-04-26 DIAGNOSIS — Z01818 Encounter for other preprocedural examination: Secondary | ICD-10-CM

## 2015-04-26 DIAGNOSIS — Z01812 Encounter for preprocedural laboratory examination: Secondary | ICD-10-CM | POA: Diagnosis not present

## 2015-04-26 DIAGNOSIS — I10 Essential (primary) hypertension: Secondary | ICD-10-CM | POA: Insufficient documentation

## 2015-04-26 DIAGNOSIS — Z0183 Encounter for blood typing: Secondary | ICD-10-CM | POA: Diagnosis not present

## 2015-04-26 DIAGNOSIS — Z87891 Personal history of nicotine dependence: Secondary | ICD-10-CM | POA: Diagnosis not present

## 2015-04-26 HISTORY — DX: Malignant (primary) neoplasm, unspecified: C80.1

## 2015-04-26 LAB — URINALYSIS, ROUTINE W REFLEX MICROSCOPIC
Bilirubin Urine: NEGATIVE
Glucose, UA: 100 mg/dL — AB
Hgb urine dipstick: NEGATIVE
Ketones, ur: NEGATIVE mg/dL
Leukocytes, UA: NEGATIVE
NITRITE: NEGATIVE
Protein, ur: NEGATIVE mg/dL
SPECIFIC GRAVITY, URINE: 1.009 (ref 1.005–1.030)
pH: 6.5 (ref 5.0–8.0)

## 2015-04-26 LAB — CBC
HCT: 44.4 % (ref 39.0–52.0)
Hemoglobin: 15.3 g/dL (ref 13.0–17.0)
MCH: 32.7 pg (ref 26.0–34.0)
MCHC: 34.5 g/dL (ref 30.0–36.0)
MCV: 94.9 fL (ref 78.0–100.0)
Platelets: 276 10*3/uL (ref 150–400)
RBC: 4.68 MIL/uL (ref 4.22–5.81)
RDW: 14.5 % (ref 11.5–15.5)
WBC: 9.5 10*3/uL (ref 4.0–10.5)

## 2015-04-26 LAB — COMPREHENSIVE METABOLIC PANEL
ALBUMIN: 4 g/dL (ref 3.5–5.0)
ALK PHOS: 70 U/L (ref 38–126)
ALT: 12 U/L — ABNORMAL LOW (ref 17–63)
AST: 17 U/L (ref 15–41)
Anion gap: 12 (ref 5–15)
BILIRUBIN TOTAL: 0.4 mg/dL (ref 0.3–1.2)
BUN: 12 mg/dL (ref 6–20)
CALCIUM: 9.7 mg/dL (ref 8.9–10.3)
CO2: 23 mmol/L (ref 22–32)
Chloride: 103 mmol/L (ref 101–111)
Creatinine, Ser: 1.07 mg/dL (ref 0.61–1.24)
GFR calc Af Amer: >60 mL/min (ref >60)
GFR calc non Af Amer: >60 mL/min (ref >60)
GLUCOSE: 95 mg/dL (ref 65–99)
Potassium: 4.3 mmol/L (ref 3.5–5.1)
Sodium: 138 mmol/L (ref 135–145)
TOTAL PROTEIN: 7 g/dL (ref 6.5–8.1)

## 2015-04-26 LAB — APTT: APTT: 36 s (ref 24–37)

## 2015-04-26 LAB — SURGICAL PCR SCREEN
MRSA, PCR: NEGATIVE
STAPHYLOCOCCUS AUREUS: NEGATIVE

## 2015-04-26 LAB — ABO/RH: ABO/RH(D): O POS

## 2015-04-26 LAB — PROTIME-INR
INR: 1.01 (ref 0.00–1.49)
Prothrombin Time: 13.5 seconds (ref 11.6–15.2)

## 2015-04-26 LAB — TYPE AND SCREEN
ABO/RH(D): O POS
Antibody Screen: NEGATIVE

## 2015-04-26 LAB — GLUCOSE, CAPILLARY: GLUCOSE-CAPILLARY: 85 mg/dL (ref 65–99)

## 2015-04-26 NOTE — Progress Notes (Signed)
Patient sees Dr. Aundra Dubin (clearance note in epic) LOV was 2-3 weeks ago.  Stress test performed. He is also allergic to chlorhexadine soap.  Dr. Trula Slade is aware.  Will need to call La[place] on Friday [phone] for special linens and gowns.   I have called the OR to let them know also.

## 2015-04-26 NOTE — Pre-Procedure Instructions (Signed)
AISEA STOLTZFOOS  04/26/2015      [facility] - [place], [place] - [address] [place] [address] Phone: [phone] Fax: [phone]  [facility] [id number] - [address] [place]), [place] - [id number] [place]) [place] [address] Phone: [phone] Fax: [phone]    Your procedure is scheduled on Friday, Dec. 30th.   Report to [facility] Admitting at 9:15 AM   Call this number if you have problems the morning of surgery:  [phone]   Remember:  Do not eat food or drink liquids after midnight Thursday.   Take these medicines the morning of surgery with A SIP OF WATER : Imdur, Lopressor   How to Manage Your Diabetes Before Surgery   Why is it important to control my blood sugar before and after surgery?   Improving blood sugar levels before and after surgery helps healing and can limit problems.  A way of improving blood sugar control is eating a healthy diet by:  - Eating less sugar and carbohydrates  - Increasing activity/exercise  - Talk with your doctor about reaching your blood sugar goals  High blood sugars (greater than 180 mg/dL) can raise your risk of infections and slow down your recovery so you will need to focus on controlling your diabetes during the weeks before surgery.  Make sure that the doctor who takes care of your diabetes knows about your planned surgery including the date and location.  How do I manage my blood sugars before surgery?   Check your blood sugar at least 4 times a day, 2 days before surgery to make sure that they are not too high or low.   Check your blood sugar the morning of your surgery when you wake up and every 2  hours until you get to the Short-Stay unit.  If your blood sugar is less than 70 mg/dL, you will need to treat for low blood sugar by:  Treat a low blood sugar (less than 70 mg/dL) with 1/2 cup of clear juice (cranberry or apple),  4 glucose tablets, OR glucose gel.  Recheck blood sugar in 15 minutes after treatment (to make sure it is greater than 70 mg/dL).  If blood sugar is not greater than 70 mg/dL on re-check, call [phone] for further instructions.   Report your blood sugar to the Short-Stay nurse when you get to Short-Stay.  References:  [facility], 2007 "How to Manage your Diabetes Before and After Surgery".  What do I do about my diabetes medications?   Do not take oral diabetes medicines (pills) the morning of surgery.   THE NIGHT BEFORE SURGERY, take 50 units of  Toujeo (As you normally would)  THE MORNING OF SURGERY, take 25 units of Toujeo   Do not take other diabetes injectables the day of surgery including Byetta, Victoza, Bydureon, and Trulicity.   If your CBG is greater than 220 mg/dL, you may take 1/2 of your sliding scale (correction) dose of insulin.                 Do not wear jewelry - no rings or watches.  Do not wear lotions or colognes.   You may NOT wear deodorant the day of surgery.             Men may shave face and neck.   Do not bring valuables to the hospital.  [facility] is not responsible for any belongings or valuables.  Contacts, dentures or bridgework may not be worn into surgery.  Leave your suitcase in the car.  After surgery it may be brought to your room.  For patients admitted to the hospital, discharge time will be determined by your treatment team.  Name and phone number of your driver:     Please read over the following fact sheets that you were given. Pain Booklet, Coughing and Deep Breathing, Blood Transfusion Information, MRSA Information and Surgical Site Infection Prevention

## 2015-04-26 NOTE — Progress Notes (Signed)
   04/26/15 1319  OBSTRUCTIVE SLEEP APNEA  Have you ever been diagnosed with sleep apnea through a sleep study? No  Do you snore loudly (loud enough to be heard through closed doors)?  0  Do you often feel tired, fatigued, or sleepy during the daytime (such as falling asleep during driving or talking to someone)? 1 (just lately sinc his foot is hurting)  Has anyone observed you stop breathing during your sleep? 0  Do you have, or are you being treated for high blood pressure? 1  BMI more than 35 kg/m2? 0  Age > 50 (1-yes) 1  Neck circumference greater than:Male 16 inches or larger, Male 17inches or larger? 1  Male Gender (Yes=1) 1  Obstructive Sleep Apnea Score 5  Score 5 or greater  Results sent to PCP

## 2015-04-27 ENCOUNTER — Other Ambulatory Visit: Payer: Self-pay | Admitting: Internal Medicine

## 2015-04-27 DIAGNOSIS — G4730 Sleep apnea, unspecified: Secondary | ICD-10-CM | POA: Insufficient documentation

## 2015-04-27 LAB — HEMOGLOBIN A1C
HEMOGLOBIN A1C: 7 % — AB (ref 4.8–5.6)
MEAN PLASMA GLUCOSE: 154 mg/dL

## 2015-05-03 MED ORDER — SODIUM CHLORIDE 0.9 % IV SOLN
INTRAVENOUS | Status: DC
Start: 2015-05-04 — End: 2015-05-04

## 2015-05-03 MED ORDER — CEFUROXIME SODIUM 1.5 G IJ SOLR
1.5000 g | INTRAMUSCULAR | Status: AC
  Administered 2015-05-04: 1.5 g via INTRAVENOUS
  Filled 2015-05-03: qty 1.5

## 2015-05-04 ENCOUNTER — Inpatient Hospital Stay (HOSPITAL_COMMUNITY)
Admission: RE | Admit: 2015-05-04 | Discharge: 2015-05-07 | DRG: 629 | Disposition: A | Discharge status: Home Health | Payer: Commercial Managed Care - HMO | Source: Ambulatory Visit | Attending: Surgery | Admitting: Surgery

## 2015-05-04 ENCOUNTER — Encounter (HOSPITAL_COMMUNITY): Payer: Self-pay | Admitting: Certified Registered Nurse Anesthetist

## 2015-05-04 ENCOUNTER — Inpatient Hospital Stay (HOSPITAL_COMMUNITY): Payer: Commercial Managed Care - HMO

## 2015-05-04 ENCOUNTER — Inpatient Hospital Stay (HOSPITAL_COMMUNITY): Payer: Commercial Managed Care - HMO | Admitting: Anesthesiology

## 2015-05-04 ENCOUNTER — Encounter (HOSPITAL_COMMUNITY)
Admission: RE | Disposition: A | Discharge status: Home Health | Payer: Self-pay | Source: Ambulatory Visit | Attending: Surgery

## 2015-05-04 DIAGNOSIS — I7025 Atherosclerosis of native arteries of other extremities with ulceration: Secondary | ICD-10-CM | POA: Diagnosis not present

## 2015-05-04 DIAGNOSIS — I2510 Atherosclerotic heart disease of native coronary artery without angina pectoris: Secondary | ICD-10-CM | POA: Diagnosis not present

## 2015-05-04 DIAGNOSIS — E11621 Type 2 diabetes mellitus with foot ulcer: Principal | ICD-10-CM | POA: Diagnosis present

## 2015-05-04 DIAGNOSIS — Z419 Encounter for procedure for purposes other than remedying health state, unspecified: Secondary | ICD-10-CM

## 2015-05-04 DIAGNOSIS — E7800 Pure hypercholesterolemia, unspecified: Secondary | ICD-10-CM | POA: Diagnosis present

## 2015-05-04 DIAGNOSIS — I739 Peripheral vascular disease, unspecified: Secondary | ICD-10-CM | POA: Diagnosis not present

## 2015-05-04 DIAGNOSIS — E1151 Type 2 diabetes mellitus with diabetic peripheral angiopathy without gangrene: Secondary | ICD-10-CM | POA: Diagnosis not present

## 2015-05-04 DIAGNOSIS — Z7984 Long term (current) use of oral hypoglycemic drugs: Secondary | ICD-10-CM | POA: Diagnosis not present

## 2015-05-04 DIAGNOSIS — I70249 Atherosclerosis of native arteries of left leg with ulceration of unspecified site: Secondary | ICD-10-CM | POA: Diagnosis present

## 2015-05-04 DIAGNOSIS — L97909 Non-pressure chronic ulcer of unspecified part of unspecified lower leg with unspecified severity: Secondary | ICD-10-CM | POA: Diagnosis not present

## 2015-05-04 DIAGNOSIS — M009 Pyogenic arthritis, unspecified: Secondary | ICD-10-CM | POA: Diagnosis present

## 2015-05-04 DIAGNOSIS — I70242 Atherosclerosis of native arteries of left leg with ulceration of calf: Secondary | ICD-10-CM | POA: Diagnosis not present

## 2015-05-04 DIAGNOSIS — L97529 Non-pressure chronic ulcer of other part of left foot with unspecified severity: Secondary | ICD-10-CM | POA: Diagnosis not present

## 2015-05-04 DIAGNOSIS — R269 Unspecified abnormalities of gait and mobility: Secondary | ICD-10-CM | POA: Diagnosis not present

## 2015-05-04 DIAGNOSIS — L97929 Non-pressure chronic ulcer of unspecified part of left lower leg with unspecified severity: Secondary | ICD-10-CM | POA: Diagnosis present

## 2015-05-04 DIAGNOSIS — T82858A Stenosis of vascular prosthetic devices, implants and grafts, initial encounter: Secondary | ICD-10-CM | POA: Diagnosis not present

## 2015-05-04 DIAGNOSIS — L98499 Non-pressure chronic ulcer of skin of other sites with unspecified severity: Secondary | ICD-10-CM | POA: Diagnosis present

## 2015-05-04 HISTORY — PX: FEMORAL-TIBIAL BYPASS GRAFT: SHX938

## 2015-05-04 HISTORY — PX: INTRAOPERATIVE ARTERIOGRAM: SHX5157

## 2015-05-04 LAB — CBC
HEMATOCRIT: 37.7 % — AB (ref 39.0–52.0)
Hemoglobin: 12.6 g/dL — ABNORMAL LOW (ref 13.0–17.0)
MCH: 32 pg (ref 26.0–34.0)
MCHC: 33.4 g/dL (ref 30.0–36.0)
MCV: 95.7 fL (ref 78.0–100.0)
Platelets: 172 10*3/uL (ref 150–400)
RBC: 3.94 MIL/uL — ABNORMAL LOW (ref 4.22–5.81)
RDW: 14.8 % (ref 11.5–15.5)
WBC: 13.7 10*3/uL — ABNORMAL HIGH (ref 4.0–10.5)

## 2015-05-04 LAB — GLUCOSE, CAPILLARY
GLUCOSE-CAPILLARY: 96 mg/dL (ref 65–99)
GLUCOSE-CAPILLARY: 106 mg/dL — AB (ref 65–99)
Glucose-Capillary: 113 mg/dL — ABNORMAL HIGH (ref 65–99)
Glucose-Capillary: 83 mg/dL (ref 65–99)

## 2015-05-04 LAB — CREATININE, SERUM
Creatinine, Ser: 1.09 mg/dL (ref 0.61–1.24)
GFR calc Af Amer: >60 mL/min (ref >60)
GFR calc non Af Amer: >60 mL/min (ref >60)

## 2015-05-04 SURGERY — CREATION, BYPASS, ARTERIAL, FEMORAL TO TIBIAL, USING GRAFT
Anesthesia: General | Site: Leg Lower | Laterality: Left

## 2015-05-04 MED ORDER — ONDANSETRON HCL 4 MG/2ML IJ SOLN
INTRAMUSCULAR | Status: DC | PRN
  Administered 2015-05-04: 4 mg via INTRAVENOUS

## 2015-05-04 MED ORDER — OMEGA-3-ACID ETHYL ESTERS 1 G PO CAPS
2000.0000 mg | ORAL_CAPSULE | Freq: Every day | ORAL | Status: DC
Start: 2015-05-04 — End: 2015-05-07
  Administered 2015-05-05 – 2015-05-07 (×3): 2000 mg via ORAL
  Filled 2015-05-04 (×4): qty 2

## 2015-05-04 MED ORDER — ARTIFICIAL TEARS OP OINT
TOPICAL_OINTMENT | OPHTHALMIC | Status: AC
  Filled 2015-05-04: qty 3.5

## 2015-05-04 MED ORDER — ASPIRIN 81 MG PO CHEW
81.0000 mg | CHEWABLE_TABLET | Freq: Every day | ORAL | Status: DC
  Administered 2015-05-04 – 2015-05-07 (×4): 81 mg via ORAL
  Filled 2015-05-04 (×4): qty 1

## 2015-05-04 MED ORDER — SUGAMMADEX SODIUM 200 MG/2ML IV SOLN
INTRAVENOUS | Status: DC | PRN
  Administered 2015-05-04: 200 mg via INTRAVENOUS

## 2015-05-04 MED ORDER — SODIUM CHLORIDE 0.9 % IV SOLN
INTRAVENOUS | Status: DC | PRN
  Administered 2015-05-04: 500 mL

## 2015-05-04 MED ORDER — FENTANYL CITRATE (PF) 250 MCG/5ML IJ SOLN
INTRAMUSCULAR | Status: AC
  Filled 2015-05-04: qty 5

## 2015-05-04 MED ORDER — LABETALOL HCL 5 MG/ML IV SOLN: 10.0000 mg | INTRAVENOUS | Status: DC | PRN

## 2015-05-04 MED ORDER — ARTIFICIAL TEARS OP OINT
TOPICAL_OINTMENT | OPHTHALMIC | Status: DC | PRN
  Administered 2015-05-04: 1 via OPHTHALMIC

## 2015-05-04 MED ORDER — METOPROLOL TARTRATE 50 MG PO TABS
50.0000 mg | ORAL_TABLET | Freq: Two times a day (BID) | ORAL | Status: DC
  Administered 2015-05-04 – 2015-05-07 (×6): 50 mg via ORAL
  Filled 2015-05-04 (×6): qty 1

## 2015-05-04 MED ORDER — PROTAMINE SULFATE 10 MG/ML IV SOLN
INTRAVENOUS | Status: DC | PRN
  Administered 2015-05-04 (×2): 50 mg via INTRAVENOUS

## 2015-05-04 MED ORDER — ROCURONIUM BROMIDE 50 MG/5ML IV SOLN
INTRAVENOUS | Status: AC
  Filled 2015-05-04: qty 1

## 2015-05-04 MED ORDER — ROCURONIUM BROMIDE 100 MG/10ML IV SOLN
INTRAVENOUS | Status: DC | PRN
  Administered 2015-05-04: 10 mg via INTRAVENOUS
  Administered 2015-05-04: 50 mg via INTRAVENOUS
  Administered 2015-05-04: 20 mg via INTRAVENOUS

## 2015-05-04 MED ORDER — ALUM & MAG HYDROXIDE-SIMETH 200-200-20 MG/5ML PO SUSP: 15.0000 mL | ORAL | Status: DC | PRN

## 2015-05-04 MED ORDER — INSULIN GLARGINE 100 UNIT/ML ~~LOC~~ SOLN
40.0000 [IU] | Freq: Every day | SUBCUTANEOUS | Status: DC
  Administered 2015-05-05 – 2015-05-06 (×2): 40 [IU] via SUBCUTANEOUS
  Filled 2015-05-04 (×4): qty 0.4

## 2015-05-04 MED ORDER — FENTANYL CITRATE (PF) 100 MCG/2ML IJ SOLN
INTRAMUSCULAR | Status: DC | PRN
  Administered 2015-05-04 (×2): 50 ug via INTRAVENOUS
  Administered 2015-05-04: 150 ug via INTRAVENOUS
  Administered 2015-05-04 (×7): 50 ug via INTRAVENOUS

## 2015-05-04 MED ORDER — DIPHENHYDRAMINE HCL 50 MG/ML IJ SOLN
INTRAMUSCULAR | Status: DC | PRN
  Administered 2015-05-04: 12.5 mg via INTRAVENOUS

## 2015-05-04 MED ORDER — HYDROMORPHONE HCL 1 MG/ML IJ SOLN: 0.2500 mg | INTRAMUSCULAR | Status: DC | PRN

## 2015-05-04 MED ORDER — PROTAMINE SULFATE 10 MG/ML IV SOLN
INTRAVENOUS | Status: AC
  Filled 2015-05-04: qty 5

## 2015-05-04 MED ORDER — SODIUM CHLORIDE 0.9 % IJ SOLN
INTRAMUSCULAR | Status: AC
  Filled 2015-05-04: qty 10

## 2015-05-04 MED ORDER — LACTATED RINGERS IV SOLN
INTRAVENOUS | Status: DC | PRN
  Administered 2015-05-04 (×3): via INTRAVENOUS

## 2015-05-04 MED ORDER — LIDOCAINE HCL (CARDIAC) 20 MG/ML IV SOLN
INTRAVENOUS | Status: AC
  Filled 2015-05-04: qty 5

## 2015-05-04 MED ORDER — LIDOCAINE HCL (CARDIAC) 20 MG/ML IV SOLN
INTRAVENOUS | Status: DC | PRN
  Administered 2015-05-04: 100 mg via INTRAVENOUS

## 2015-05-04 MED ORDER — SUCCINYLCHOLINE CHLORIDE 20 MG/ML IJ SOLN
INTRAMUSCULAR | Status: AC
  Filled 2015-05-04: qty 1

## 2015-05-04 MED ORDER — ASPIRIN EC 325 MG PO TBEC: 975.0000 mg | DELAYED_RELEASE_TABLET | ORAL | Status: DC | PRN

## 2015-05-04 MED ORDER — 0.9 % SODIUM CHLORIDE (POUR BTL) OPTIME
TOPICAL | Status: DC | PRN
  Administered 2015-05-04: 2000 mL

## 2015-05-04 MED ORDER — ISOSORBIDE MONONITRATE ER 30 MG PO TB24
30.0000 mg | ORAL_TABLET | Freq: Two times a day (BID) | ORAL | Status: DC
  Administered 2015-05-05 – 2015-05-07 (×5): 30 mg via ORAL
  Filled 2015-05-04 (×6): qty 1

## 2015-05-04 MED ORDER — DEXTROSE 5 % IV SOLN
1.5000 g | INTRAVENOUS | Status: AC
  Administered 2015-05-04: 1.5 g via INTRAVENOUS
  Filled 2015-05-04: qty 1.5

## 2015-05-04 MED ORDER — PROMETHAZINE HCL 25 MG/ML IJ SOLN: 6.2500 mg | INTRAMUSCULAR | Status: DC | PRN

## 2015-05-04 MED ORDER — PHENYLEPHRINE HCL 10 MG/ML IJ SOLN
10.0000 mg | INTRAMUSCULAR | Status: DC | PRN
  Administered 2015-05-04: 20 ug/min via INTRAVENOUS

## 2015-05-04 MED ORDER — HEPARIN SODIUM (PORCINE) 1000 UNIT/ML IJ SOLN
INTRAMUSCULAR | Status: AC
  Filled 2015-05-04: qty 1

## 2015-05-04 MED ORDER — SODIUM CHLORIDE 0.9 % IV SOLN: 500.0000 mL | Freq: Once | INTRAVENOUS | Status: DC | PRN

## 2015-05-04 MED ORDER — METFORMIN HCL 500 MG PO TABS
1000.0000 mg | ORAL_TABLET | Freq: Two times a day (BID) | ORAL | Status: DC
  Administered 2015-05-07: 1000 mg via ORAL
  Filled 2015-05-04: qty 2

## 2015-05-04 MED ORDER — PRAVASTATIN SODIUM 40 MG PO TABS
80.0000 mg | ORAL_TABLET | Freq: Every day | ORAL | Status: DC
  Administered 2015-05-04 – 2015-05-06 (×3): 80 mg via ORAL
  Filled 2015-05-04 (×4): qty 2

## 2015-05-04 MED ORDER — ONDANSETRON HCL 4 MG/2ML IJ SOLN: 4.0000 mg | Freq: Four times a day (QID) | INTRAMUSCULAR | Status: DC | PRN

## 2015-05-04 MED ORDER — ACETAMINOPHEN 325 MG PO TABS
325.0000 mg | ORAL_TABLET | ORAL | Status: DC | PRN
  Administered 2015-05-05: 650 mg via ORAL
  Filled 2015-05-04: qty 2

## 2015-05-04 MED ORDER — CLOPIDOGREL BISULFATE 75 MG PO TABS
75.0000 mg | ORAL_TABLET | Freq: Every day | ORAL | Status: DC
  Administered 2015-05-05 – 2015-05-07 (×3): 75 mg via ORAL
  Filled 2015-05-04 (×3): qty 1

## 2015-05-04 MED ORDER — PHENYLEPHRINE HCL 10 MG/ML IJ SOLN
INTRAMUSCULAR | Status: DC | PRN
  Administered 2015-05-04 (×2): 80 ug via INTRAVENOUS
  Administered 2015-05-04: 40 ug via INTRAVENOUS

## 2015-05-04 MED ORDER — SUGAMMADEX SODIUM 200 MG/2ML IV SOLN
INTRAVENOUS | Status: AC
  Filled 2015-05-04: qty 2

## 2015-05-04 MED ORDER — DIPHENHYDRAMINE HCL 50 MG/ML IJ SOLN
INTRAMUSCULAR | Status: AC
  Filled 2015-05-04: qty 1

## 2015-05-04 MED ORDER — PROPOFOL 10 MG/ML IV BOLUS
INTRAVENOUS | Status: DC | PRN
  Administered 2015-05-04: 150 mg via INTRAVENOUS
  Administered 2015-05-04: 50 mg via INTRAVENOUS

## 2015-05-04 MED ORDER — GLYCOPYRROLATE 0.2 MG/ML IJ SOLN
INTRAMUSCULAR | Status: DC | PRN
  Administered 2015-05-04: 0.4 mg via INTRAVENOUS

## 2015-05-04 MED ORDER — PHENYLEPHRINE HCL 10 MG/ML IJ SOLN
INTRAMUSCULAR | Status: AC
  Filled 2015-05-04: qty 2

## 2015-05-04 MED ORDER — MAGNESIUM SULFATE 2 GM/50ML IV SOLN
2.0000 g | Freq: Every day | INTRAVENOUS | Status: DC | PRN
  Filled 2015-05-04: qty 50

## 2015-05-04 MED ORDER — INSULIN ASPART 100 UNIT/ML ~~LOC~~ SOLN
0.0000 [IU] | Freq: Three times a day (TID) | SUBCUTANEOUS | Status: DC
  Administered 2015-05-05: 2 [IU] via SUBCUTANEOUS

## 2015-05-04 MED ORDER — ENOXAPARIN SODIUM 40 MG/0.4ML ~~LOC~~ SOLN
40.0000 mg | SUBCUTANEOUS | Status: DC
Start: 2015-05-05 — End: 2015-05-07
  Administered 2015-05-05 – 2015-05-07 (×3): 40 mg via SUBCUTANEOUS
  Filled 2015-05-04 (×3): qty 0.4

## 2015-05-04 MED ORDER — PHENOL 1.4 % MT LIQD: 1.0000 | OROMUCOSAL | Status: DC | PRN

## 2015-05-04 MED ORDER — SODIUM CHLORIDE 0.9 % IV SOLN
INTRAVENOUS | Status: DC
  Administered 2015-05-04: 15:00:00 via INTRAVENOUS

## 2015-05-04 MED ORDER — DOCUSATE SODIUM 100 MG PO CAPS
100.0000 mg | ORAL_CAPSULE | Freq: Every day | ORAL | Status: DC
Start: 2015-05-05 — End: 2015-05-07
  Administered 2015-05-05 – 2015-05-07 (×3): 100 mg via ORAL
  Filled 2015-05-04 (×3): qty 1

## 2015-05-04 MED ORDER — OXYCODONE HCL 5 MG PO TABS
5.0000 mg | ORAL_TABLET | ORAL | Status: DC | PRN
  Administered 2015-05-04: 5 mg via ORAL
  Administered 2015-05-05 – 2015-05-06 (×2): 10 mg via ORAL
  Filled 2015-05-04 (×2): qty 2
  Filled 2015-05-04: qty 1

## 2015-05-04 MED ORDER — ONDANSETRON HCL 4 MG/2ML IJ SOLN
INTRAMUSCULAR | Status: AC
  Filled 2015-05-04: qty 2

## 2015-05-04 MED ORDER — PANTOPRAZOLE SODIUM 40 MG PO TBEC
40.0000 mg | DELAYED_RELEASE_TABLET | Freq: Every day | ORAL | Status: DC
  Administered 2015-05-04 – 2015-05-07 (×4): 40 mg via ORAL
  Filled 2015-05-04 (×4): qty 1

## 2015-05-04 MED ORDER — MIDAZOLAM HCL 5 MG/5ML IJ SOLN
INTRAMUSCULAR | Status: DC | PRN
  Administered 2015-05-04: 2 mg via INTRAVENOUS

## 2015-05-04 MED ORDER — LISINOPRIL 40 MG PO TABS
40.0000 mg | ORAL_TABLET | Freq: Every day | ORAL | Status: DC
  Administered 2015-05-04 – 2015-05-07 (×4): 40 mg via ORAL
  Filled 2015-05-04 (×4): qty 1

## 2015-05-04 MED ORDER — GUAIFENESIN-DM 100-10 MG/5ML PO SYRP: 15.0000 mL | ORAL_SOLUTION | ORAL | Status: DC | PRN

## 2015-05-04 MED ORDER — GLIMEPIRIDE 1 MG PO TABS
1.0000 mg | ORAL_TABLET | Freq: Every day | ORAL | Status: DC
  Administered 2015-05-05 – 2015-05-07 (×3): 1 mg via ORAL
  Filled 2015-05-04 (×5): qty 1

## 2015-05-04 MED ORDER — POTASSIUM CHLORIDE CRYS ER 20 MEQ PO TBCR: 20.0000 meq | EXTENDED_RELEASE_TABLET | Freq: Every day | ORAL | Status: DC | PRN

## 2015-05-04 MED ORDER — OXYCODONE HCL 5 MG PO TABS: 5.0000 mg | ORAL_TABLET | ORAL | Status: DC | PRN

## 2015-05-04 MED ORDER — IOHEXOL 300 MG/ML  SOLN
INTRAMUSCULAR | Status: DC | PRN
  Administered 2015-05-04 (×2): 25 mL via INTRA_ARTERIAL

## 2015-05-04 MED ORDER — EPHEDRINE SULFATE 50 MG/ML IJ SOLN
INTRAMUSCULAR | Status: AC
  Filled 2015-05-04: qty 1

## 2015-05-04 MED ORDER — PROPOFOL 10 MG/ML IV BOLUS
INTRAVENOUS | Status: AC
  Filled 2015-05-04: qty 20

## 2015-05-04 MED ORDER — HEMOSTATIC AGENTS (NO CHARGE) OPTIME
TOPICAL | Status: DC | PRN
  Administered 2015-05-04 (×2): 1 via TOPICAL

## 2015-05-04 MED ORDER — MORPHINE SULFATE (PF) 2 MG/ML IV SOLN
2.0000 mg | INTRAVENOUS | Status: DC | PRN
  Administered 2015-05-04: 4 mg via INTRAVENOUS
  Administered 2015-05-05 – 2015-05-06 (×2): 2 mg via INTRAVENOUS
  Filled 2015-05-04: qty 1
  Filled 2015-05-04: qty 2
  Filled 2015-05-04: qty 1

## 2015-05-04 MED ORDER — METOPROLOL TARTRATE 1 MG/ML IV SOLN: 2.0000 mg | INTRAVENOUS | Status: DC | PRN

## 2015-05-04 MED ORDER — HEPARIN SODIUM (PORCINE) 1000 UNIT/ML IJ SOLN
INTRAMUSCULAR | Status: DC | PRN
  Administered 2015-05-04 (×2): 8000 [IU] via INTRAVENOUS
  Administered 2015-05-04: 2000 [IU] via INTRAVENOUS

## 2015-05-04 MED ORDER — DEXTROSE 5 % IV SOLN
1.5000 g | Freq: Two times a day (BID) | INTRAVENOUS | Status: AC
  Administered 2015-05-04 – 2015-05-05 (×2): 1.5 g via INTRAVENOUS
  Filled 2015-05-04 (×3): qty 1.5

## 2015-05-04 MED ORDER — MIDAZOLAM HCL 2 MG/2ML IJ SOLN
INTRAMUSCULAR | Status: AC
  Filled 2015-05-04: qty 2

## 2015-05-04 MED ORDER — ACETAMINOPHEN 650 MG RE SUPP: 325.0000 mg | RECTAL | Status: DC | PRN

## 2015-05-04 MED ORDER — HYDRALAZINE HCL 20 MG/ML IJ SOLN: 5.0000 mg | INTRAMUSCULAR | Status: DC | PRN

## 2015-05-04 SURGICAL SUPPLY — 70 items
BANDAGE ELASTIC 4 VELCRO ST LF (GAUZE/BANDAGES/DRESSINGS) IMPLANT
BANDAGE ESMARK 6X9 LF (GAUZE/BANDAGES/DRESSINGS) ×1 IMPLANT
BNDG COHESIVE 6X5 TAN STRL LF (GAUZE/BANDAGES/DRESSINGS) ×3 IMPLANT
BNDG ESMARK 6X9 LF (GAUZE/BANDAGES/DRESSINGS) ×3
BNDG GAUZE ELAST 4 BULKY (GAUZE/BANDAGES/DRESSINGS) ×3 IMPLANT
CANISTER SUCTION 2500CC (MISCELLANEOUS) ×3 IMPLANT
CANNULA VESSEL 3MM 2 BLNT TIP (CANNULA) IMPLANT
CATH EMB 3FR 80CM (CATHETERS) ×3 IMPLANT
CATH FOLEY 2WAY SLVR  5CC 14FR (CATHETERS) ×2
CATH FOLEY 2WAY SLVR  5CC 24FR (CATHETERS) ×2
CATH FOLEY 2WAY SLVR 5CC 14FR (CATHETERS) ×1 IMPLANT
CATH FOLEY 2WAY SLVR 5CC 24FR (CATHETERS) ×1 IMPLANT
CLIP TI MEDIUM 24 (CLIP) ×3 IMPLANT
CLIP TI WIDE RED SMALL 24 (CLIP) ×3 IMPLANT
COVER PROBE W GEL 5X96 (DRAPES) ×3 IMPLANT
CUFF TOURNIQUET SINGLE 24IN (TOURNIQUET CUFF) ×3 IMPLANT
CUFF TOURNIQUET SINGLE 34IN LL (TOURNIQUET CUFF) IMPLANT
CUFF TOURNIQUET SINGLE 44IN (TOURNIQUET CUFF) IMPLANT
DRAIN CHANNEL 15F RND FF W/TCR (WOUND CARE) IMPLANT
DRAPE PROXIMA HALF (DRAPES) IMPLANT
DRAPE X-RAY CASS 24X20 (DRAPES) ×6 IMPLANT
DRSG COVADERM 4X10 (GAUZE/BANDAGES/DRESSINGS) IMPLANT
DRSG COVADERM 4X8 (GAUZE/BANDAGES/DRESSINGS) IMPLANT
ELECT REM PT RETURN 9FT ADLT (ELECTROSURGICAL) ×3
ELECTRODE REM PT RTRN 9FT ADLT (ELECTROSURGICAL) ×1 IMPLANT
EVACUATOR SILICONE 100CC (DRAIN) IMPLANT
GLOVE BIO SURGEON STRL SZ 6.5 (GLOVE) ×4 IMPLANT
GLOVE BIO SURGEONS STRL SZ 6.5 (GLOVE) ×2
GLOVE BIOGEL PI IND STRL 6.5 (GLOVE) ×7 IMPLANT
GLOVE BIOGEL PI IND STRL 7.5 (GLOVE) ×1 IMPLANT
GLOVE BIOGEL PI INDICATOR 6.5 (GLOVE) ×14
GLOVE BIOGEL PI INDICATOR 7.5 (GLOVE) ×2
GLOVE ECLIPSE 6.5 STRL STRAW (GLOVE) ×9 IMPLANT
GLOVE SS BIOGEL STRL SZ 7 (GLOVE) ×2 IMPLANT
GLOVE SUPERSENSE BIOGEL SZ 7 (GLOVE) ×4
GLOVE SURG SS PI 7.5 STRL IVOR (GLOVE) ×3 IMPLANT
GOWN STRL REIN 3XL LVL4 (GOWN DISPOSABLE) ×15 IMPLANT
GOWN STRL REIN XL XLG (GOWN DISPOSABLE) ×6 IMPLANT
HEMOSTAT SNOW SURGICEL 2X4 (HEMOSTASIS) ×6 IMPLANT
KIT BASIN OR (CUSTOM PROCEDURE TRAY) ×3 IMPLANT
KIT ROOM TURNOVER OR (KITS) ×3 IMPLANT
LIQUID BAND (GAUZE/BANDAGES/DRESSINGS) ×9 IMPLANT
MARKER GRAFT CORONARY BYPASS (MISCELLANEOUS) IMPLANT
NS IRRIG 1000ML POUR BTL (IV SOLUTION) ×6 IMPLANT
PACK PERIPHERAL VASCULAR (CUSTOM PROCEDURE TRAY) ×3 IMPLANT
PAD ARMBOARD 7.5X6 YLW CONV (MISCELLANEOUS) ×6 IMPLANT
PADDING CAST COTTON 6X4 STRL (CAST SUPPLIES) IMPLANT
SET COLLECT BLD 21X3/4 12 (NEEDLE) ×3 IMPLANT
SLEEVE SURGEON STRL (DRAPES) ×3 IMPLANT
STOCKINETTE IMPERVIOUS LG (DRAPES) ×3 IMPLANT
STOPCOCK 4 WAY LG BORE MALE ST (IV SETS) IMPLANT
SUT ETHILON 3 0 PS 1 (SUTURE) IMPLANT
SUT PROLENE 5 0 C 1 24 (SUTURE) ×30 IMPLANT
SUT PROLENE 6 0 BV (SUTURE) ×6 IMPLANT
SUT PROLENE 7 0 BV 1 (SUTURE) ×3 IMPLANT
SUT PROLENE 7 0 BV1 MDA (SUTURE) ×3 IMPLANT
SUT SILK 2 0 SH (SUTURE) ×3 IMPLANT
SUT SILK 3 0 (SUTURE) ×6
SUT SILK 3-0 18XBRD TIE 12 (SUTURE) ×3 IMPLANT
SUT VIC AB 2-0 CT1 27 (SUTURE) ×4
SUT VIC AB 2-0 CT1 TAPERPNT 27 (SUTURE) ×2 IMPLANT
SUT VIC AB 3-0 SH 27 (SUTURE) ×8
SUT VIC AB 3-0 SH 27X BRD (SUTURE) ×4 IMPLANT
SUT VICRYL 4-0 PS2 18IN ABS (SUTURE) ×12 IMPLANT
SYR 3ML LL SCALE MARK (SYRINGE) ×3 IMPLANT
TAPE UMBILICAL COTTON 1/8X30 (MISCELLANEOUS) IMPLANT
TRAY FOLEY W/METER SILVER 16FR (SET/KITS/TRAYS/PACK) ×3 IMPLANT
TUBING EXTENTION W/L.L. (IV SETS) ×3 IMPLANT
UNDERPAD 30X30 INCONTINENT (UNDERPADS AND DIAPERS) ×3 IMPLANT
WATER STERILE IRR 1000ML POUR (IV SOLUTION) ×3 IMPLANT

## 2015-05-04 NOTE — Interval H&P Note (Signed)
History and Physical Interval Note:  05/04/2015 7:22 AM  [name]  has presented today for surgery, with the diagnosis of Left lower extremity ulcer L97.909  The various methods of treatment have been discussed with the patient and family. After consideration of risks, benefits and other options for treatment, the patient has consented to  Procedure(s): BYPASS GRAFT FEMORAL-ANTERIOR TIBIAL ARTERY (Left) as a surgical intervention .  The patient's history has been reviewed, patient examined, no change in status, stable for surgery.  I have reviewed the patient's chart and labs.  Questions were answered to the patient's satisfaction.     Annamarie Major

## 2015-05-04 NOTE — H&P (View-Only) (Signed)
Patient name: [name] MRN: [id number] DOB: January 12, 1956 Sex: male     Chief Complaint  Patient presents with  . Re-evaluation    1-2 wk f/u - vein mapping cancelled    HISTORY OF PRESENT ILLNESS: The patient is back for follow-up of his left lower extremity ulcers, which began several months ago.  He has been in and out of the hospital for IV antibiotics.  He underwent angiography by Dr. Bridgett Larsson on December 1.  This showed occlusive disease involving the superficial femoral and popliteal artery.  Patient is here today to discuss surgical options.  The patient has significant cardiac history.  He is scheduled to get a Myoview for cardiac clearance.  He has hypercholesterolemia which is managed with a statin.  His blood pressures controlled with an ACE inhibitor.  He also takes dual antiplatelet therapy with aspirin and Plavix, however he has held his Plavix because of his septic arthritis in his foot  Past Medical History  Diagnosis Date  . CAD, NATIVE VESSEL 04/16/2008    Qualifier: Diagnosis of  By: Aundra Dubin, MD, [place]    . Essential hypertension 04/16/2008    Qualifier: Diagnosis of  By: Aundra Dubin, MD, [place]    . Hyperlipidemia with target LDL less than 100 04/16/2008        . Hypertriglyceridemia 03/26/2014  . PAD (peripheral artery disease) ([place]) 03/26/2014  . Peripheral vascular disease, unspecified ([place]) 11/17/2011  . Type 2 diabetes mellitus with vascular disease ([place]) 06/22/2009    Qualifier: Diagnosis of  By: Karrie Meres RN, BSN, [place]    . Uncontrolled diabetes mellitus type 2 with peripheral artery disease ([place]) 03/16/2015  . Acute osteomyelitis of left foot ([place]) 03/16/2015  . Cellulitis and abscess of leg, except foot 02/21/2015  . Colon cancer screening 01/30/2015  . Diabetic ulcer of left foot with necrosis of muscle ([place]) 02/21/2015  . Dyslipidemia associated with type 2 diabetes mellitus ([place]) 03/16/2015  . Encounter for long-term (current) use of other medications  10/16/2011  . Left carotid bruit 12/11/2014  . Osteoarthritis of right hip 04/09/2012  . Septic arthritis of left foot ([place]) 03/16/2015    Past Surgical History  Procedure Laterality Date  . Cardiac catheterization      2011  . Leg surgery      on bone above ankle-not sure which bone left leg 1981  . Multiple extractions with alveoloplasty  10/31/2011    Procedure: MULTIPLE EXTRACION WITH ALVEOLOPLASTY;  Surgeon: Lenn Cal, DDS;  Location: Day;  Service: Oral Surgery;  Laterality: N/A;  Extraction of tooth #'s 2, 4,5,6,7,8,9,10,11,12,13,15,21,22,23,24,25,26,27,28, 29, and 31 with alveoloplasty  . Leg surgery  1981    Bone cyst- Left leg  . Peripheral vascular catheterization N/A 04/05/2015    Procedure: Abdominal Aortogram w/Lower Extremity;  Surgeon: Conrad C-Road, MD;  Location: [facility];  Service: Cardiovascular;  Laterality: N/A;    Social History   Social History  . Marital Status: Married    Spouse Name: N/A  . Number of Children: N/A  . Years of Education: N/A   Occupational History  .      Disabled. Former Biomedical scientist.   Social History Main Topics  . Smoking status: Former Smoker -- 0.50 packs/day for 43 years    Types: Cigarettes  . Smokeless tobacco: Never Used     Comment: heavy 2nd hand exposue ongoing  . Alcohol Use: No     Comment: former heavy user quit 2011  .  Drug Use: No  . Sexual Activity: Not on file   Other Topics Concern  . Not on file   Social History Narrative   The patient is married. The patient has no children.   Patient with a history of smoking one pack per day for 43 years. Patient started smoking at the age of 59.   Patient no longer drinks alcohol. Patient quit proximally one and a half years ago from a previously heavy alcohol use.    Family History  Problem Relation Age of Onset  . Heart attack Mother     64's  . Heart disease Mother     Heart disease before age 74  . Hypertension Mother   . Hyperlipidemia Mother   .  Peripheral Artery Disease Father   . Hypertension Father   . Hyperlipidemia Father   . Peripheral vascular disease Father   . Heart attack      40's/40's    Allergies as of 04/16/2015 - Review Complete 04/16/2015  Allergen Reaction Noted  . Actos [pioglitazone]  10/13/2011  . Fenofibrate Other (See Comments) 02/04/2012  . Chlorhexidine gluconate Rash 10/31/2011  . Orange oil Rash 01/19/2012  . Soap Other (See Comments) 10/28/2011    Current Outpatient Prescriptions on File Prior to Visit  Medication Sig Dispense Refill  . aspirin 81 MG tablet Take 81 mg by mouth daily.    Marland Kitchen aspirin EC 325 MG tablet Take 975 mg by mouth every 4 (four) hours as needed for mild pain.     . cilostazol (PLETAL) 100 MG tablet Take 1 tablet (100 mg total) by mouth 2 (two) times daily. 180 tablet 3  . clopidogrel (PLAVIX) 75 MG tablet Take 1 tablet (75 mg total) by mouth daily. 90 tablet 2  . glimepiride (AMARYL) 1 MG tablet Take 1 tablet (1 mg total) by mouth daily with breakfast. 90 tablet 1  . glucose blood (TRUETEST TEST) test strip Use as instructed to check blood sugar twice daily dx 250.00 200 each 3  . Icosapent Ethyl (VASCEPA) 1 G CAPS Take 2,000 mg by mouth.    . Insulin Glargine (TOUJEO SOLOSTAR) 300 UNIT/ML SOPN Inject 55 Units into the skin daily. (Patient taking differently: Inject 50 Units into the skin daily. ) 1.5 mL 11  . Insulin Pen Needle (NOVOFINE) 32G X 6 MM MISC 1 Act by Does not apply route daily. Use 1 QD 100 each 3  . isosorbide mononitrate (IMDUR) 30 MG 24 hr tablet Take 1 tablet (30 mg total) by mouth 2 (two) times daily. 180 tablet 3  . lisinopril (PRINIVIL,ZESTRIL) 40 MG tablet Take 1 tablet (40 mg total) by mouth daily. 90 tablet 2  . metFORMIN (GLUCOPHAGE) 500 MG tablet Take 2 tablets (1,000 mg total) by mouth 2 (two) times daily with a meal. 360 tablet 1  . metoprolol (LOPRESSOR) 50 MG tablet Take 50 mg by mouth 2 (two) times daily.    . naproxen (NAPROSYN) 375 MG tablet Take  375 mg by mouth 2 (two) times daily with a meal.    . pravastatin (PRAVACHOL) 80 MG tablet Take 1 tablet (80 mg total) by mouth daily. 90 tablet 3  . TRUEPLUS LANCETS 28G MISC 1 each by Does not apply route 2 (two) times daily. Dx 250.00 200 each 3   No current facility-administered medications on file prior to visit.     REVIEW OF SYSTEMS: REVIEW OF SYSTEMS: (Positives checked otherwise negative)  CARDIOVASCULAR:  [ ]  chest pain,  [ ]   chest pressure,  [ ]  palpitations,  [ ]  shortness of breath when laying flat,  [ ]  shortness of breath with exertion,  [x]  pain in feet when walking,  [ ]  pain in feet when laying flat, [ ]  history of blood clot in veins (DVT),  [ ]  history of phlebitis,  [ ]  swelling in legs,  [ ]  varicose veins  PULMONARY:  [ ]  productive cough,  [ ]  asthma,  [ ]  wheezing  NEUROLOGIC:  [ ]  weakness in arms or legs,  [ ]  numbness in arms or legs,  [ ]  difficulty speaking or slurred speech,  [ ]  temporary loss of vision in one eye,  [ ]  dizziness  HEMATOLOGIC:  [ ]  bleeding problems,  [ ]  problems with blood clotting too easily  MUSCULOSKEL:  [ ]  joint pain, [ ]  joint swelling  GASTROINTEST:  [ ]  vomiting blood,  [ ]  blood in stool   GENITOURINARY:  [ ]  burning with urination,  [ ]  blood in urine  PSYCHIATRIC:  [ ]  history of major depression  INTEGUMENTARY:  [ ]  rashes,  [x]  ulcers  CONSTITUTIONAL:  [ ]  fever,  [ ]  chills   PHYSICAL EXAMINATION:   Vital signs are  Filed Vitals:   04/16/15 1028 04/16/15 1031  BP: 146/77 138/72  Pulse: 82   Temp: 98.4 F (36.9 C)   TempSrc: Oral   Height: 5\' 10"  (1.778 m)   Weight: 186 lb 9.6 oz (84.641 kg)   SpO2: 98%    Body mass index is 26.77 kg/(m^2). General: The patient appears their stated age. HEENT:  No gross abnormalities Pulmonary:  Non labored breathing Abdomen: Soft and non-tender Musculoskeletal: There are no major  deformities. Neurologic: No focal weakness or paresthesias are detected, Skin: There are no ulcer or rashes noted. Psychiatric: The patient has normal affect. Cardiovascular: There is a regular rate and rhythm without significant murmur appreciated.  Open wound at the base of the left fifth metatarsal head.  There is also an eschar on the lateral left calf and medial foot.   Diagnostic Studies None  Assessment: Left leg ulcer Plan: This is a limb threatening situation.  The patient has active wound on his left foot as well as erythema going up to the mid calf despite IV antibiotics and several hospital admissions.  I have discussed proceeding with left femoral anterior tibial bypass graft for limb salvage.  I discussed that even with revascularization he will still be at risk for limb loss until his wounds have healed.  He also may require amputation of the left fifth toe because of the infection and the metatarsal head.  I stressed the importance of this procedure.  The patient is contemplating going straight to a below knee amputation.  He will need a Myoview for cardiac clearance.  He wants to get this done after the holidays.  I will try to get this on the schedule.  He'll need to be off of his Plavix for at least 5 days.  Eldridge Abrahams, M.D. [facility] Office: [phone] Pager:  [phone]

## 2015-05-04 NOTE — Progress Notes (Signed)
Utilization Review Completed.,  T12/30/2016  

## 2015-05-04 NOTE — Progress Notes (Signed)
  [facility] Day [facility] Note  Subjective:  Says throat is dry. Having pain with left foot and left groin incision.  Filed Vitals:   05/04/15 1450 05/04/15 1453  BP:  110/62  Pulse: 76 78  Temp: 97.7 F (36.5 C) 98 F (36.7 C)  Resp: 21 16    Incisions: Left groin and left leg incisions healing well. No hematoma. Extremities:  Brisk strong Doppler flow to left PT and DP. Left foot is dressed. Dressing is clean.  Assessment/Plan:  This is a 59 y.o. male who is s/p left femoral to below-knee popliteal bypass with non-reversed left greater saphenous vein and intraoperative arteriogram x 2  Day of Surgery  Stable postop. Bypass is patent. Strong doppler flow to left foot. Vanc and Zosyn for left leg ulceration. WOC consulted.   Virgina Jock, [place] Pager: [phone] 05/04/2015 4:29 PM

## 2015-05-04 NOTE — Transfer of Care (Signed)
Immediate Anesthesia Transfer of Care Note  Patient: [name]  Procedure(s) Performed: Procedure(s): LEFT  FEMORAL-BELOW THE KNEE POPLITEAL  ARTERY BYPASS GRAFT USING NON REVERSE LEFT GREATER SAPHENOUS VEIN (Left) INTRA OPERATIVE ARTERIOGRAM TIMES TWO (Left)  Patient Location: PACU  Anesthesia Type:General  Level of Consciousness: awake, alert , oriented and patient cooperative  Airway & Oxygen Therapy: Patient Spontanous Breathing and Patient connected to face mask oxygen  Post-op Assessment: Report given to RN, Post -op Vital signs reviewed and stable and Patient moving all extremities X 4  Post vital signs: Reviewed and stable  Last Vitals:  Filed Vitals:   05/04/15 0636  BP: 144/79  Pulse: 72  Temp: 36.6 C  Resp: 20    Complications: No apparent anesthesia complications

## 2015-05-04 NOTE — Anesthesia Preprocedure Evaluation (Addendum)
Anesthesia Evaluation  Patient identified by MRN, date of birth, ID band Patient awake    Reviewed: NPO status , Patient's Chart, lab work & pertinent test results  History of Anesthesia Complications Negative for: history of anesthetic complications  Airway Mallampati: II   Neck ROM: Full    Dental  (+) Edentulous Upper   Pulmonary sleep apnea , former smoker,    breath sounds clear to auscultation       Cardiovascular hypertension, + CAD, + Past MI and + Peripheral Vascular Disease   Rhythm:Regular Rate:Normal     Neuro/Psych  Neuromuscular disease    GI/Hepatic negative GI ROS, Neg liver ROS,   Endo/Other  diabetes, Poorly Controlled  Renal/GU      Musculoskeletal  (+) Arthritis ,   Abdominal   Peds  Hematology   Anesthesia Other Findings   Reproductive/Obstetrics                            Anesthesia Physical Anesthesia Plan  ASA: III  Anesthesia Plan: General   Post-op Pain Management:    Induction: Intravenous  Airway Management Planned: Oral ETT  Additional Equipment:   Intra-op Plan:   Post-operative Plan:   Informed Consent: I have reviewed the patients History and Physical, chart, labs and discussed the procedure including the risks, benefits and alternatives for the proposed anesthesia with the patient or authorized representative who has indicated his/her understanding and acceptance.   Dental advisory given  Plan Discussed with:   Anesthesia Plan Comments:         Anesthesia Quick Evaluation

## 2015-05-04 NOTE — Anesthesia Procedure Notes (Signed)
Procedure Name: Intubation Date/Time: 05/04/2015 7:42 AM Performed by: Rogers Blocker Pre-anesthesia Checklist: Patient identified, Timeout performed, Emergency Drugs available, Patient being monitored and Suction available Patient Re-evaluated:Patient Re-evaluated prior to inductionOxygen Delivery Method: Circle system utilized Preoxygenation: Pre-oxygenation with 100% oxygen Intubation Type: IV induction Ventilation: Mask ventilation without difficulty and Oral airway inserted - appropriate to patient size Laryngoscope Size: Sabra Heck and 2 Grade View: Grade I Tube type: Oral Tube size: 7.5 mm Number of attempts: 1 Airway Equipment and Method: Stylet Placement Confirmation: ETT inserted through vocal cords under direct vision,  breath sounds checked- equal and bilateral,  positive ETCO2 and CO2 detector Secured at: 22 cm Tube secured with: Tape Dental Injury: Teeth and Oropharynx as per pre-operative assessment

## 2015-05-04 NOTE — Op Note (Signed)
Patient name: [name] MRN: [id number] DOB: 14-Dec-1955 Sex: male  05/04/2015 Pre-operative Diagnosis: Ulcer, left leg Post-operative diagnosis:  Same Surgeon:  Annamarie Major Assistants:  Mickeal Needy Procedure:   Left femoral to below knee popliteal artery bypass graft with non-reversed ipsilateral greater saphenous vein with intraoperative angiogram Anesthesia:  Gen. Blood Loss:  See anesthesia record Specimens:  None  Findings:  Very calcified common femoral artery requiring focal endarterectomy.  The distal anastomosis was to the below-knee popliteal artery.  After the initial operation, a completion angiogram was performed which showed what looked like to be a retained valve at the distal anastomosis.  I opened the anastomosis again and resected what was thought to be a valve leaflet.  The second completion angiogram revealed resolution of this concern.  Indications:  The patient presents with critical limb ischemia and a nonhealing wound.  Procedure:  The patient was identified in the holding area and taken to [place] [facility]  The patient was then placed supine on the table. general anesthesia was administered.  The patient was prepped and draped in the usual sterile fashion.  A time out was called and antibiotics were administered.  An oblique incision was made in the left groin.  Simultaneous tissue was divided with cautery down the femoral sheath.  The common femoral artery was then dissected free with Metzenbaum scissors from the inguinal ligament down to the femoral bifurcation.  The superficial femoral and profunda femoral artery were each individually isolated.  Next identified the saphenous vein at its origin.  Through skip incisions, the great saphenous vein was harvested, ligating side branches between silk ties and metal clips.  The vein was of excellent diameter measuring approximately 4-5 millimeters throughout.  I incorporated the vein harvested through the medial  below-knee incision.  Through this incision I went through the fascia and reflected the gastrocnemius muscle posteriorly to identify the popliteal space.  I then identified the popliteal artery which appeared to be soft and relatively disease-free.  I then created a subsartorial tunnel with a Gore tunneler.  The vein was ligated distally with a 2-0 silk tie.  A Cooley J clamp was placed to the saphenofemoral junction and the saphenous vein was ligated at its origin.  The origin was oversewn with 2 layers of 5-0 Prolene.  The patient was then fully heparinized.  The vein was prepared on the back table.  It distended nicely.  The vein was placed in a non-reversed fashion.  The common femoral artery was occluded with vascular clamps.  A #11 blade was used to make an arteriotomy which was extended longitudinally with Potts scissors.  I initially tried to perform a end to side anastomosis but I was unable to get the needle to go through the calcified femoral artery.  I therefore performed femoral endarterectomy which required opening the arteriotomy slightly.  Once this was done I tacked the end of the endarterectomy with 6-0 Prolene.  I then performed a end to side anastomosis with running 5-0 Prolene.  Once this was completed the clamps were released.  I did have to place a vein pledgeted suture on the posterior wall where the endarterectomy site was very thin.  Next a valvulotome was used to lyse the valves.  The vein was then brought through the previously created tunnel, making sure to maintain proper orientation.  I had just enough vein to complete the anastomosis.  The popliteal artery was then occluded.  I used a #  11 blade to make an arteriotomy and then opened it with Potts scissors.  There appeared to be a neointima which was completely occlusive within the popliteal artery.  This was removed and I did get some backbleeding.  I then passed a #3 Fogarty catheter without resistance down across the ankle.  No clot  was evacuated and there was good backbleeding.  I felt this was now an adequate distal target.  After spatulating the vein, a end to side anastomosis was created with running 6-0 Prolene.  Prior to completion, the profunda flushing maneuvers were performed the anastomosis was completed.  I then performed an intraoperative arteriogram by cannulating the proximal portion of the vein.  This appeared to show a narrowing at the anastomosis distally which look like a retained valve leaflet.  I therefore re-heparinize the patient and I opened the distal anastomosis and resected what I thought was a retained valve.  I easily passed a #3 dilator across the popliteal artery.  I was again able to get up Fogarty down to the ankle and also throughout the vein bypass.  The anastomosis was then closed with running 6-0 Prolene.  Blood flow was reestablished to the leg.  A second intraoperative arteriogram was performed which showed significant improvement in the above concern.  I was satisfied with these results.  50 mg of protamine was given.  After hemostasis was satisfactory, the groin incision was closed with 20 and 3-0 Vicryl followed by 4-0 Vicryl on the skin.  The below knee incision was closed by reapproximating the fascia with 2-0 Vicryl followed by 3040.  The other vein harvest incisions were closed with 2 layers of 3-0 Vicryl.  Dermabond was placed on all the incisions.  There were no immediate complications.   Disposition:  To PACU in stable condition.   Theotis Burrow, M.D. [facility] [place] Office: [phone] Pager:  [phone]

## 2015-05-04 NOTE — Anesthesia Postprocedure Evaluation (Signed)
Anesthesia Post Note  Patient: [name]  Procedure(s) Performed: Procedure(s) (LRB): LEFT  FEMORAL-BELOW THE KNEE POPLITEAL  ARTERY BYPASS GRAFT USING NON REVERSE LEFT GREATER SAPHENOUS VEIN (Left) INTRA OPERATIVE ARTERIOGRAM TIMES TWO (Left)  Patient location during evaluation: PACU Anesthesia Type: General Level of consciousness: awake and alert Pain management: pain level controlled Vital Signs Assessment: post-procedure vital signs reviewed and stable Respiratory status: spontaneous breathing, nonlabored ventilation, respiratory function stable and patient connected to nasal cannula oxygen Cardiovascular status: blood pressure returned to baseline and stable Postop Assessment: no signs of nausea or vomiting Anesthetic complications: no    Last Vitals:  Filed Vitals:   05/04/15 1450 05/04/15 1453  BP:  110/62  Pulse: 76 78  Temp: 36.5 C 36.7 C  Resp: 21 16    Last Pain:  Filed Vitals:   05/04/15 1504  PainSc: 0-No pain                 ,JAMES TERRILL

## 2015-05-05 LAB — BASIC METABOLIC PANEL
Anion gap: 7 (ref 5–15)
BUN: 9 mg/dL (ref 6–20)
CALCIUM: 8.6 mg/dL — AB (ref 8.9–10.3)
CO2: 23 mmol/L (ref 22–32)
CREATININE: 0.98 mg/dL (ref 0.61–1.24)
Chloride: 105 mmol/L (ref 101–111)
GFR calc non Af Amer: >60 mL/min (ref >60)
Glucose, Bld: 119 mg/dL — ABNORMAL HIGH (ref 65–99)
Potassium: 4.1 mmol/L (ref 3.5–5.1)
SODIUM: 135 mmol/L (ref 135–145)

## 2015-05-05 LAB — CBC
HCT: 39.4 % (ref 39.0–52.0)
Hemoglobin: 12.9 g/dL — ABNORMAL LOW (ref 13.0–17.0)
MCH: 31.2 pg (ref 26.0–34.0)
MCHC: 32.7 g/dL (ref 30.0–36.0)
MCV: 95.2 fL (ref 78.0–100.0)
PLATELETS: 193 10*3/uL (ref 150–400)
RBC: 4.14 MIL/uL — ABNORMAL LOW (ref 4.22–5.81)
RDW: 14.8 % (ref 11.5–15.5)
WBC: 9.8 10*3/uL (ref 4.0–10.5)

## 2015-05-05 LAB — GLUCOSE, CAPILLARY
GLUCOSE-CAPILLARY: 125 mg/dL — AB (ref 65–99)
GLUCOSE-CAPILLARY: 111 mg/dL — AB (ref 65–99)
Glucose-Capillary: 109 mg/dL — ABNORMAL HIGH (ref 65–99)
Glucose-Capillary: 101 mg/dL — ABNORMAL HIGH (ref 65–99)

## 2015-05-05 NOTE — Progress Notes (Signed)
Pt transferred to 2W per MD order. Report called to receiving nurse and all questions answered.

## 2015-05-05 NOTE — Evaluation (Signed)
Physical Therapy Evaluation Patient Details Name: [name] MRN: [id number] DOB: 31-Aug-1955 Today's Date: 05/05/2015   History of Present Illness  Pt is a 59 y/o M s/p Lt femoral popliteal bypass graft.  Pt's PMH includes Lt LE surgery.  Clinical Impression  Patient is s/p above surgery resulting in functional limitations due to the deficits listed below (see PT Problem List). Evaluation limited from severe Lt LE pain that pt describes as feeling like "a sharp knife" and pt unable to complete sit>stand.  He will have 24/7 assist from his wife at d/c.  Patient will benefit from skilled PT to increase their independence and safety with mobility to allow discharge to the venue listed below.       Follow Up Recommendations Home health PT;Supervision for mobility/OOB    Equipment Recommendations  Rolling walker with 5" wheels    Recommendations for Other Services       Precautions / Restrictions Precautions Precautions: Fall Restrictions Weight Bearing Restrictions: No      Mobility  Bed Mobility Overal bed mobility: Needs Assistance Bed Mobility: Supine to Sit     Supine to sit: Min guard     General bed mobility comments: Very increased time and max encouragement and verbal cues.  HOB flat and no use of bed rails to simulate home environment.  Step by step cues for scooting EOB.  Transfers Overall transfer level: Needs assistance Equipment used: Rolling walker (2 wheeled) Transfers: Sit to/from Stand Sit to Stand: Min guard         General transfer comment: Attempted x3, pt gets to squating position, requiring max encouragement and verbal cues for technique.  Pt shouting due to sharp pain in Lt LE w/ sit>stand.    Ambulation/Gait             General Gait Details: unable due to 10/10 pain  Stairs            Wheelchair Mobility    Modified Rankin (Stroke Patients Only)       Balance Overall balance assessment: Needs assistance Sitting-balance  support: Feet supported;Bilateral upper extremity supported Sitting balance-Leahy Scale: Fair     Standing balance support: Bilateral upper extremity supported;During functional activity Standing balance-Leahy Scale: Poor                               Pertinent Vitals/Pain Pain Assessment: 0-10 Pain Score: 10-Worst pain ever Pain Location: Lt LE Pain Descriptors / Indicators: Operative site guarding;Sharp ("like a knife") Pain Intervention(s): Limited activity within patient's tolerance;Monitored during session;Premedicated before session;Repositioned    Home Living Family/patient expects to be discharged to:: Private residence Living Arrangements: Spouse/significant other Available Help at Discharge: Family;Available 24 hours/day Type of Home: House Home Access: Level entry     Home Layout: Two level;Able to live on main level with bedroom/bathroom Home Equipment: Kasandra Knudsen - single point      Prior Function Level of Independence: Independent with assistive device(s)         Comments: used a cane to steady when out in community     Hand Dominance        Extremity/Trunk Assessment   Upper Extremity Assessment: Overall WFL for tasks assessed           Lower Extremity Assessment: LLE deficits/detail;RLE deficits/detail   LLE Deficits / Details: weakness and ROM limited s/p surgery.  Severe muscle guarding due to pain  Cervical / Trunk Assessment: Normal  Communication   Communication: No difficulties  Cognition Arousal/Alertness: Awake/alert Behavior During Therapy: Anxious Overall Cognitive Status: Within Functional Limits for tasks assessed                      General Comments General comments (skin integrity, edema, etc.): Evaluation limited from severe Lt LE pain that pt describes as feeling like "a sharp knife"    Exercises General Exercises - Lower Extremity Ankle Circles/Pumps: AROM;Both;10 reps;Seated Long Arc Quad:  AROM;Both;10 reps;Seated Other Exercises Other Exercises: Pt encouraged to attempt sit<>stand later today w/ nursing staff Other Exercises: Pt encouraged to complete therapeutic exercises throughout day      Assessment/Plan    PT Assessment Patient needs continued PT services  PT Diagnosis Difficulty walking;Acute pain   PT Problem List Decreased strength;Decreased range of motion;Decreased activity tolerance;Decreased balance;Decreased mobility;Decreased knowledge of use of DME;Decreased safety awareness;Decreased knowledge of precautions;Impaired sensation;Pain  PT Treatment Interventions DME instruction;Gait training;Functional mobility training;Therapeutic activities;Therapeutic exercise;Balance training;Neuromuscular re-education;Patient/family education;Modalities   PT Goals (Current goals can be found in the Care Plan section) Acute Rehab PT Goals Patient Stated Goal: decreased pain PT Goal Formulation: With patient Time For Goal Achievement: 05/12/15 Potential to Achieve Goals: Good    Frequency Min 3X/week   Barriers to discharge        Co-evaluation               End of Session Equipment Utilized During Treatment: Gait belt Activity Tolerance: Patient limited by pain Patient left: in bed;with call bell/phone within reach;Other (comment) (pt sitting EOB) Nurse Communication: Mobility status         Time: [id number] PT Time Calculation (min) (ACUTE ONLY): 33 min   Charges:   PT Evaluation $Initial PT Evaluation Tier I: 1 Procedure PT Treatments $Therapeutic Activity: 8-22 mins   PT G Codes:       Joslyn Hy PT, DPT [phone] Pager: [phone] 05/05/2015, 1:12 PM

## 2015-05-05 NOTE — Progress Notes (Signed)
Pt transferred from [facility]. Alert,oriented and able to voice needs. No concerns expressed at this time

## 2015-05-05 NOTE — Progress Notes (Addendum)
  Progress Note    05/05/2015 8:03 AM 1 Day Post-Op  Subjective:  No complaints  Tm 99.4 HR  80's-90's NSR 0000000 systolic [id number]  Filed Vitals:   05/04/15 2259 05/05/15 0257  BP: 119/69 117/73  Pulse: 91 88  Temp: 99.4 F (37.4 C) 99 F (37.2 C)  Resp: 18 20    Physical Exam: Cardiac:  Regular Lungs:  Non labored Incisions:  All are clean and dry Extremities:  Left foot with brisk doppler signal DP   CBC    Component Value Date/Time   WBC 9.8 05/05/2015 0641   RBC 4.14* 05/05/2015 0641   HGB 12.9* 05/05/2015 0641   HCT 39.4 05/05/2015 0641   PLT 193 05/05/2015 0641   MCV 95.2 05/05/2015 0641   MCH 31.2 05/05/2015 0641   MCHC 32.7 05/05/2015 0641   RDW 14.8 05/05/2015 0641   LYMPHSABS 0.8 03/30/2015 1035   MONOABS 0.9 03/30/2015 1035   EOSABS 0.1 03/30/2015 1035   BASOSABS 0.0 03/30/2015 1035    BMET    Component Value Date/Time   NA 135 05/05/2015 0641   K 4.1 05/05/2015 0641   CL 105 05/05/2015 0641   CO2 23 05/05/2015 0641   GLUCOSE 119* 05/05/2015 0641   BUN 9 05/05/2015 0641   CREATININE 0.98 05/05/2015 0641   CALCIUM 8.6* 05/05/2015 0641   GFRNONAA >60 05/05/2015 0641   GFRAA >60 05/05/2015 0641    INR    Component Value Date/Time   INR 1.01 04/26/2015 1357     Intake/Output Summary (Last 24 hours) at 05/05/15 0803 Last data filed at 05/05/15 0600  Gross per 24 hour  Intake 3692.5 ml  Output   3010 ml  Net  682.5 ml     Assessment:  59 y.o. male is s/p:  Left femoral to below knee popliteal artery bypass graft with non-reversed ipsilateral greater saphenous vein with intraoperative angiogram  1 Day Post-Op  Plan: -pt with patent bypass graft with brisk left DP doppler signal -d/c foley and OOB to chair this morning -WOC consult ordered -dry gauze to left groin to wick moisture and help prevent wound infection -probable transfer to 2 [facility] today -DVT prophylaxis:  Lovenox to start this morning   Leontine Locket, PA-C [facility] [phone] 05/05/2015 8:03 AM    Addendum  I have independently interviewed and examined the patient, and I agree with the physician assistant's findings.  Ok to transfer to floor.  [name], MD [facility] [place] Office: [phone] Pager: [phone]  05/05/2015, 9:09 AM

## 2015-05-05 NOTE — Progress Notes (Signed)
MD paged regarding pt not being able to void

## 2015-05-05 NOTE — Progress Notes (Signed)
Pt has not voided since foley removal this morning. Says he feels as though he needs to void but nothing is coming out. Bladder scanned with 53cc noted in bladder

## 2015-05-05 NOTE — Progress Notes (Signed)
Pt voided 100cc this evening

## 2015-05-05 NOTE — Progress Notes (Signed)
Dr Desmond Dike called back and said to wait another 3 hours and repeat bladder scan if pt not voided yet. Please page Dr Bridgett Larsson for any orders as he is the MD on call

## 2015-05-06 ENCOUNTER — Encounter (HOSPITAL_COMMUNITY): Payer: Self-pay | Admitting: Family

## 2015-05-06 ENCOUNTER — Inpatient Hospital Stay (HOSPITAL_COMMUNITY): Payer: Commercial Managed Care - HMO

## 2015-05-06 DIAGNOSIS — I7025 Atherosclerosis of native arteries of other extremities with ulceration: Secondary | ICD-10-CM

## 2015-05-06 LAB — GLUCOSE, CAPILLARY
GLUCOSE-CAPILLARY: 102 mg/dL — AB (ref 65–99)
GLUCOSE-CAPILLARY: 108 mg/dL — AB (ref 65–99)
Glucose-Capillary: 110 mg/dL — ABNORMAL HIGH (ref 65–99)
Glucose-Capillary: 88 mg/dL (ref 65–99)

## 2015-05-06 LAB — URINALYSIS, ROUTINE W REFLEX MICROSCOPIC
Bilirubin Urine: NEGATIVE
GLUCOSE, UA: NEGATIVE mg/dL
HGB URINE DIPSTICK: NEGATIVE
Ketones, ur: 40 mg/dL — AB
LEUKOCYTES UA: NEGATIVE
Nitrite: NEGATIVE
Protein, ur: 30 mg/dL — AB
SPECIFIC GRAVITY, URINE: 1.017 (ref 1.005–1.030)
pH: 5.5 (ref 5.0–8.0)

## 2015-05-06 LAB — URINE MICROSCOPIC-ADD ON
Bacteria, UA: NONE SEEN
RBC / HPF: NONE SEEN RBC/hpf (ref 0–5)

## 2015-05-06 NOTE — Progress Notes (Signed)
VASCULAR LAB PRELIMINARY  ARTERIAL  ABI completed:Post op ABIs demonstrate increase in arterial flow, bilaterally, since previous study done 03/11/15.    RIGHT    LEFT    PRESSURE WAVEFORM  PRESSURE WAVEFORM  BRACHIAL 140 T BRACHIAL 147 T  DP   DP 114 M  AT 36 M AT    PT 96 M PT 110 M  PER   PER    GREAT TOE  NA GREAT TOE  NA    RIGHT LEFT  ABI 0.65 0.78     , , RVT 05/06/2015, 12:38 PM

## 2015-05-06 NOTE — Progress Notes (Signed)
Pt ambulated 25 feet in the room. One person assist with front wheel . Pt expresses soreness in the left leg, but no pain. Pt tolerated ambulation well.   Fritz Pickerel, RN

## 2015-05-06 NOTE — Progress Notes (Addendum)
  Progress Note    05/06/2015 7:23 AM 2 Days Post-Op  Subjective:  Says his foot is feeling better  Afebrile HR 80's-90's NSR [id number] systolic 0000000 RA  Filed Vitals:   05/05/15 2018 05/06/15 0409  BP: 156/80 151/74  Pulse: 92 86  Temp: 98.5 F (36.9 C) 98.1 F (36.7 C)  Resp: 18 20    Physical Exam: Cardiac:  regular Lungs:  Non labored Incisions:  All are healing nicely Extremities:  left foot with palpable DP pulse; areas of eschar laterally and dime size area medially at the level of the ankle.  There is a wound on the lateral aspect of the foot that is malodorous and draining   CBC    Component Value Date/Time   WBC 9.8 05/05/2015 0641   RBC 4.14* 05/05/2015 0641   HGB 12.9* 05/05/2015 0641   HCT 39.4 05/05/2015 0641   PLT 193 05/05/2015 0641   MCV 95.2 05/05/2015 0641   MCH 31.2 05/05/2015 0641   MCHC 32.7 05/05/2015 0641   RDW 14.8 05/05/2015 0641   LYMPHSABS 0.8 03/30/2015 1035   MONOABS 0.9 03/30/2015 1035   EOSABS 0.1 03/30/2015 1035   BASOSABS 0.0 03/30/2015 1035    BMET    Component Value Date/Time   NA 135 05/05/2015 0641   K 4.1 05/05/2015 0641   CL 105 05/05/2015 0641   CO2 23 05/05/2015 0641   GLUCOSE 119* 05/05/2015 0641   BUN 9 05/05/2015 0641   CREATININE 0.98 05/05/2015 0641   CALCIUM 8.6* 05/05/2015 0641   GFRNONAA >60 05/05/2015 0641   GFRAA >60 05/05/2015 0641    INR    Component Value Date/Time   INR 1.01 04/26/2015 1357     Intake/Output Summary (Last 24 hours) at 05/06/15 0723 Last data filed at 05/06/15 0654  Gross per 24 hour  Intake    480 ml  Output   1600 ml  Net  -1120 ml     Assessment:  60 y.o. male is s/p:  Left femoral to below knee popliteal artery bypass graft with non-reversed ipsilateral greater saphenous vein with intraoperative angiogram  2 Days Post-Op  Plan: -pt with patent bypass graft with palpable left DP pulse -dry dressing to wounds for now-WOC consult has been placed.  Suspect that  given the holiday, they are short staffed.  -malodorous wound on lateral left foot-will start Augmentin as he is not PCN allergic or renal pt -DVT prophylaxis:  Lovenox -continue PT - pt states since he had nerve damage in the distant past from previous surgery, he has trouble with his left leg.  He does have his wife at home to help him. -probably needs at least another day   Leontine Locket, [place] [facility] [phone] 05/06/2015 7:23 AM  Addendum  I have independently interviewed and examined the patient, and I agree with the physician assistant's findings.  Palpable DP pulse.  Dry gangreneous changes in calf anteriorly and laterally.  Spontaneously draining ulcer lateral to 5th MT.  No erythema currently.  Will start Augmentin DS 1 po BID as precaution.  [name], MD [facility] [place] Office: [phone] Pager: [phone]  05/06/2015, 8:23 AM

## 2015-05-07 MED ORDER — OXYCODONE HCL 5 MG PO TABS: 5.0000 mg | ORAL_TABLET | ORAL | Status: DC | PRN

## 2015-05-07 MED ORDER — AMOXICILLIN-POT CLAVULANATE 875-125 MG PO TABS: 1.0000 | ORAL_TABLET | Freq: Two times a day (BID) | ORAL | Status: DC

## 2015-05-07 NOTE — Progress Notes (Addendum)
  Progress Note    05/07/2015 7:52 AM 3 Days Post-Op  Subjective:  Ready to go home.  Feels his foot is better in color and his pain is better.    Afebrile 0000000 systolic  HR 123456 NSR 95% RA  Filed Vitals:   05/06/15 1959 05/07/15 0317  BP: 151/76 143/80  Pulse: 85 94  Temp: 98.3 F (36.8 C) 98.1 F (36.7 C)  Resp: 18 19    Physical Exam: Lungs:  Non labored Incisions:  All are healing nicely Extremities:  Left foot is warm; wounds on foot appear slightly improved from yesterday with the medial eschar lighter in color; lateral wound appears to be drying up and the odor is much improved.   CBC    Component Value Date/Time   WBC 9.8 05/05/2015 0641   RBC 4.14* 05/05/2015 0641   HGB 12.9* 05/05/2015 0641   HCT 39.4 05/05/2015 0641   PLT 193 05/05/2015 0641   MCV 95.2 05/05/2015 0641   MCH 31.2 05/05/2015 0641   MCHC 32.7 05/05/2015 0641   RDW 14.8 05/05/2015 0641   LYMPHSABS 0.8 03/30/2015 1035   MONOABS 0.9 03/30/2015 1035   EOSABS 0.1 03/30/2015 1035   BASOSABS 0.0 03/30/2015 1035    BMET    Component Value Date/Time   NA 135 05/05/2015 0641   K 4.1 05/05/2015 0641   CL 105 05/05/2015 0641   CO2 23 05/05/2015 0641   GLUCOSE 119* 05/05/2015 0641   BUN 9 05/05/2015 0641   CREATININE 0.98 05/05/2015 0641   CALCIUM 8.6* 05/05/2015 0641   GFRNONAA >60 05/05/2015 0641   GFRAA >60 05/05/2015 0641    INR    Component Value Date/Time   INR 1.01 04/26/2015 1357     Intake/Output Summary (Last 24 hours) at 05/07/15 0752 Last data filed at 05/06/15 2001  Gross per 24 hour  Intake    720 ml  Output    800 ml  Net    -80 ml     Assessment:  60 y.o. male is s/p:  Left femoral to below knee popliteal artery bypass graft with non-reversed ipsilateral greater saphenous vein with intraoperative angiogram  3 Days Post-Op  Plan: -pt's left foot is warm and well perfused  -wounds on foot appear slightly improved from yesterday with the medial  eschar lighter in color; lateral wound appears to be drying up and the odor is much improved. -he states he has ambulated to bathroom by himself and is getting around better.  He does have help at home with his wife. -u/a from yesterday negative -will discharge today on Augmentin for 7 days for foot wounds -continue dry dressing to wounds on left foot daily   Leontine Locket, PA-C [facility] [phone] 05/07/2015 7:52 AM  Addendum  I have independently interviewed and examined the patient, and I agree with the physician assistant's findings.  Ok to D/C  [name], MD [facility] [place] Office: [phone] Pager: [phone]  05/07/2015, 10:01 AM

## 2015-05-07 NOTE — Care Management Note (Signed)
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     Expected Discharge Date:                  Expected Discharge Plan:  [place]  In-House Referral:     Discharge planning Services  CM Consult  Post Acute Care Choice:    Choice offered to:  Patient  DME Arranged:  [facility] rolling DME Agency:  [place]:  PT [facility] Agency:  [place]  Status of Service:  Completed, signed off  Medicare Important Message Given:    Date Medicare IM Given:    Medicare IM give by:    Date Additional Medicare IM Given:    Additional Medicare Important Message give by:     If discussed at [place] of Stay Meetings, dates discussed:    Additional Comments: Pt stated that he may want to switch from HHPT to outpt PT at some point in his recovery due to limited space in the home, however pt is agreeable to HHPT at this time, CM advised pt that his PCP can write outpt orders if needed in the future.  CM offered pt choice, pt chose [facility], agency contacted and referral accepted.  Address and phone number verified against epic.  Maryclare Labrador, RN 05/07/2015, 10:03 AM

## 2015-05-07 NOTE — Discharge Summary (Signed)
Discharge Summary     [name] [birth year] 60 y.o. male  [id number]  Admission Date: 05/04/2015  Discharge Date: 05/07/15  Physician: Serafina Mitchell, MD  Admission Diagnosis: Left lower extremity ulcer L97.909   HPI:   This is a 60 y.o. male patient is back for follow-up of his left lower extremity ulcers, which began several months ago. He has been in and out of the hospital for IV antibiotics. He underwent angiography by Dr. Bridgett Larsson on December 1. This showed occlusive disease involving the superficial femoral and popliteal artery. Patient is here today to discuss surgical options.  The patient has significant cardiac history. He is scheduled to get a Myoview for cardiac clearance. He has hypercholesterolemia which is managed with a statin. His blood pressures controlled with an ACE inhibitor. He also takes dual antiplatelet therapy with aspirin and Plavix, however he has held his Plavix because of his septic arthritis in his foot  Hospital Course:  The patient was admitted to the hospital and taken to the operating room on 05/04/2015 and underwent: Left femoral to below knee popliteal artery bypass graft with non-reversed ipsilateral greater saphenous vein with intraoperative angiogram    The pt tolerated the procedure well and was transported to the PACU in good condition.   By POD 1, he was doing well with brisk doppler signals in his left DP.   He was transferred to the telemetry unit.   On POD 2, his wound lateral left foot wound was draining and malodorous. Augmentin started as a precaution.  He still was not ambulating that well and was kept another day.  On POD 3, his wounds are improved with odor better today.  His foot is warm and well perfused with motor and sensation in tact.  He is ambulating with a RW by himself.     His home medications has asa 975mg  daily listed-I have asked the pt to only take his baby aspirin and not the large dose of Aspirin.  He  expressed understanding.  The remainder of the hospital course consisted of increasing mobilization and increasing intake of solids without difficulty.  CBC    Component Value Date/Time   WBC 9.8 05/05/2015 0641   RBC 4.14* 05/05/2015 0641   HGB 12.9* 05/05/2015 0641   HCT 39.4 05/05/2015 0641   PLT 193 05/05/2015 0641   MCV 95.2 05/05/2015 0641   MCH 31.2 05/05/2015 0641   MCHC 32.7 05/05/2015 0641   RDW 14.8 05/05/2015 0641   LYMPHSABS 0.8 03/30/2015 1035   MONOABS 0.9 03/30/2015 1035   EOSABS 0.1 03/30/2015 1035   BASOSABS 0.0 03/30/2015 1035    BMET    Component Value Date/Time   NA 135 05/05/2015 0641   K 4.1 05/05/2015 0641   CL 105 05/05/2015 0641   CO2 23 05/05/2015 0641   GLUCOSE 119* 05/05/2015 0641   BUN 9 05/05/2015 0641   CREATININE 0.98 05/05/2015 0641   CALCIUM 8.6* 05/05/2015 0641   GFRNONAA >60 05/05/2015 0641   GFRAA >60 05/05/2015 0641     Discharge Instructions    Call MD for:  redness, tenderness, or signs of infection (pain, swelling, bleeding, redness, odor or green/yellow discharge around incision site)    Complete by:  As directed      Call MD for:  severe or increased pain, loss or decreased feeling  in affected limb(s)    Complete by:  As directed      Call MD for:  temperature >100.5  Complete by:  As directed      Discharge instructions    Complete by:  As directed   Continue dry dressing to left foot daily     Discharge wound care:    Complete by:  As directed   Wash the groin wound with soap and water daily and pat dry. (No tub bath-only shower)  Then put a dry gauze or washcloth there to keep this area dry daily and as needed. Wash remaining wounds with soap and water and pat dry.  Do not use Vaseline or neosporin on your incisions.  Only use soap and water on your incisions and then protect and keep dry.     Driving Restrictions    Complete by:  As directed   No driving for 2 weeks     Increase activity slowly    Complete by:   As directed   Walk with assistance use walker or cane as needed     Lifting restrictions    Complete by:  As directed   No lifting for 2 weeks     Resume previous diet    Complete by:  As directed            Discharge Diagnosis:  Left lower extremity ulcer L97.909  Secondary Diagnosis: Patient Active Problem List   Diagnosis Date Noted  . Atherosclerosis of native arteries of other extremities with ulceration ([place]) 05/04/2015  . Sleep apnea 04/27/2015  . Osteomyelitis ([place]) 03/30/2015  . Diabetic foot ([place]) 03/30/2015  . Acute osteomyelitis of left foot ([place]) 03/16/2015  . Septic arthritis of left foot ([place]) 03/16/2015  . Uncontrolled diabetes mellitus type 2 with peripheral artery disease ([place]) 03/16/2015  . Dyslipidemia associated with type 2 diabetes mellitus ([place]) 03/16/2015  . Diabetic ulcer of left foot with necrosis of muscle ([place]) 02/21/2015  . Cellulitis and abscess of leg, except foot 02/21/2015  . Colon cancer screening 01/30/2015  . Left carotid bruit 12/11/2014  . PAD (peripheral artery disease) ([place]) 03/26/2014  . Hypertriglyceridemia 03/26/2014  . Osteoarthritis of right hip 04/09/2012  . Peripheral vascular disease, unspecified ([place]) 11/17/2011  . Encounter for long-term (current) use of other medications 10/16/2011  . Type 2 diabetes mellitus with vascular disease ([place]) 06/22/2009  . Hyperlipidemia with target LDL less than 100 04/16/2008  . Essential hypertension 04/16/2008  . CAD, NATIVE VESSEL 04/16/2008   Past Medical History  Diagnosis Date  . Essential hypertension 04/16/2008    Qualifier: Diagnosis of  By: Aundra Dubin, MD, [place]    . Hyperlipidemia with target LDL less than 100 04/16/2008        . Hypertriglyceridemia 03/26/2014  . PAD (peripheral artery disease) ([place]) 03/26/2014  . Peripheral vascular disease, unspecified ([place]) 11/17/2011  . Type 2 diabetes mellitus with vascular disease ([place]) 06/22/2009    Qualifier: Diagnosis of  By: Karrie Meres  RN, BSN, Anne    . Uncontrolled diabetes mellitus type 2 with peripheral artery disease ([place]) 03/16/2015  . Acute osteomyelitis of left foot ([place]) 03/16/2015  . Cellulitis and abscess of leg, except foot 02/21/2015  . Colon cancer screening 01/30/2015  . Diabetic ulcer of left foot with necrosis of muscle ([place]) 02/21/2015  . Dyslipidemia associated with type 2 diabetes mellitus ([place]) 03/16/2015  . Encounter for long-term (current) use of other medications 10/16/2011  . Left carotid bruit 12/11/2014  . Osteoarthritis of right hip 04/09/2012  . Septic arthritis of left foot ([place]) 03/16/2015  . CAD, NATIVE VESSEL 04/16/2008    Qualifier: Diagnosis  of  By: Aundra Dubin, MD, [place]    . Myocardial infarction ([place])     2009  . Cancer ([place])     non-hodgkins lymphoma  2000  recd radiation       Medication List    STOP taking these medications        cilostazol 100 MG tablet  Commonly known as:  PLETAL     naproxen sodium 220 MG tablet  Commonly known as:  ANAPROX      TAKE these medications        amoxicillin-clavulanate 875-125 MG tablet  Commonly known as:  AUGMENTIN  Take 1 tablet by mouth 2 (two) times daily.     aspirin 81 MG tablet  Take 81 mg by mouth daily.     clopidogrel 75 MG tablet  Commonly known as:  PLAVIX  Take 1 tablet (75 mg total) by mouth daily.     glimepiride 1 MG tablet  Commonly known as:  AMARYL  Take 1 tablet (1 mg total) by mouth daily with breakfast.     Insulin Glargine 300 UNIT/ML Sopn  Commonly known as:  TOUJEO SOLOSTAR  Inject 55 Units into the skin daily.     Insulin Pen Needle 32G X 6 MM Misc  Commonly known as:  NOVOFINE  1 Act by Does not apply route daily. Use 1 QD     isosorbide mononitrate 30 MG 24 hr tablet  Commonly known as:  IMDUR  Take 1 tablet (30 mg total) by mouth 2 (two) times daily.     lisinopril 40 MG tablet  Commonly known as:  PRINIVIL,ZESTRIL  Take 1 tablet (40 mg total) by mouth daily.     metFORMIN 500 MG tablet    Commonly known as:  GLUCOPHAGE  Take 2 tablets (1,000 mg total) by mouth 2 (two) times daily with a meal.     metoprolol 50 MG tablet  Commonly known as:  LOPRESSOR  Take 50 mg by mouth 2 (two) times daily.     naproxen 375 MG tablet  Commonly known as:  NAPROSYN  Take 375 mg by mouth 2 (two) times daily with a meal.     oxyCODONE 5 MG immediate release tablet  Commonly known as:  Oxy IR/ROXICODONE  Take 1-2 tablets (5-10 mg total) by mouth every 4 (four) hours as needed for moderate pain.     pravastatin 80 MG tablet  Commonly known as:  PRAVACHOL  Take 1 tablet (80 mg total) by mouth daily.     VASCEPA 1 g Caps  Generic drug:  Icosapent Ethyl  Take 2,000 mg by mouth daily.        Prescriptions given: 1.  Roxicodone #30 No Refill 2. Augmentin bid x 7 days.  Instructions: 1.  Wash the groin wound with soap and water daily and pat dry. (No tub bath-only shower)  Then put a dry gauze or washcloth there to keep this area dry daily and as needed.  Do not use Vaseline or neosporin on your incisions.  Only use soap and water on your incisions and then protect and keep dry. 2.  Continue dry dressing to left foot daily.  Disposition: home  Patient's condition: is Good  Follow up: 1. Dr. Trula Slade in 2 weeks   Leontine Locket, PA-C [facility] [phone] 05/07/2015  8:21 AM  - For VQI Registry use --- Instructions: Press F2 to tab through selections.  Delete question if not applicable.   Post-op:  Wound  infection: No  Graft infection: No  Transfusion: No  If yes, n/a units given New Arrhythmia: No Ipsilateral amputation: No, [ ]  Minor, [ ]  BKA, [ ]  AKA Discharge patency: [x ] Primary, [ ]  Primary assisted, [ ]  Secondary, [ ]  Occluded Patency judged by: [ ]  Dopper only, [ ]  Palpable graft pulse, [x]  Palpable distal pulse, [ ]  ABI inc. > 0.15, [ ]  Duplex Discharge ABI: R 0.65, L 0.78 D/C Ambulatory Status: Ambulatory  Complications: MI: No, [ ]   Troponin only, [ ]  EKG or Clinical CHF: No Resp failure:No, [ ]  Pneumonia, [ ]  Ventilator Chg in renal function: No, [ ]  Inc. Cr > 0.5, [ ]  Temp. Dialysis, [ ]  Permanent dialysis Stroke: No, [ ]  Minor, [ ]  Major Return to OR: No  Reason for return to OR: [ ]  Bleeding, [ ]  Infection, [ ]  Thrombosis, [ ]  Revision  Discharge medications: Statin use:  yes ASA use:  yes Plavix use:  yes Beta blocker use: yes ACEI use:   yes ARB use:  no Coumadin use: no

## 2015-05-07 NOTE — Care Management Important Message (Signed)
Important Message  Patient Details  Name: [name] MRN: [id number] Date of Birth: 04/15/1956   Medicare Important Message Given:  Yes    Louanne Belton 05/07/2015, 11:19 AMImportant Message  Patient Details  Name: [name] MRN: [id number] Date of Birth: 1955/08/23   Medicare Important Message Given:  Yes    ,  G 05/07/2015, 11:19 AM

## 2015-05-08 ENCOUNTER — Encounter (HOSPITAL_COMMUNITY): Payer: Self-pay | Admitting: Surgery

## 2015-05-08 ENCOUNTER — Encounter: Payer: Self-pay | Admitting: Surgery

## 2015-05-08 ENCOUNTER — Telehealth: Payer: Self-pay | Admitting: Surgery

## 2015-05-08 ENCOUNTER — Encounter: Payer: Self-pay | Admitting: Internal Medicine

## 2015-05-08 LAB — GLUCOSE, CAPILLARY: GLUCOSE-CAPILLARY: 106 mg/dL — AB (ref 65–99)

## 2015-05-08 NOTE — Telephone Encounter (Signed)
LM for pt to call [name] to schedule follow up. Pts wife will have him call, dpm

## 2015-05-08 NOTE — Telephone Encounter (Signed)
-----   Message from Mena Goes, RN sent at 05/06/2015  5:37 PM EST ----- Regarding: schedule   ----- Message -----    From: Alvia Grove, PA-C    Sent: 05/04/2015   4:20 PM      To: [facility] Charge Pool  S/p left fem-below-knee pop bypass 05/04/15  F/u with [facility] in 2 weeks  Thanks Maudie Mercury

## 2015-05-09 ENCOUNTER — Telehealth: Payer: Self-pay | Admitting: *Deleted

## 2015-05-09 MED ORDER — GLUCOSE BLOOD VI STRP: ORAL_STRIP | Status: DC

## 2015-05-09 MED ORDER — TRUEPLUS LANCETS 28G MISC: Status: AC

## 2015-05-09 NOTE — Telephone Encounter (Signed)
Parshall, [place] [phone]) called to let us know that she has made several attempts to arrange a time to go out and see this patient. Today, the patient again adamantly refused PT. He stated that he was not ready yet. Darnelle reported that he said"may be up to it next week but I won't know until then".  Adv. Physical therapy will try to contact him again. He has had no PT since discharge on 05-04-15.

## 2015-05-10 ENCOUNTER — Telehealth: Payer: Self-pay

## 2015-05-10 ENCOUNTER — Other Ambulatory Visit: Payer: Self-pay | Admitting: Physician Assistant

## 2015-05-10 NOTE — Telephone Encounter (Signed)
Requested patient to call our triage office to discuss this refill;  My chart patient refill request received.

## 2015-05-10 NOTE — Telephone Encounter (Signed)
Phone call from pt.  Reported he would like to get another prescription for pain medication, due to the weather forecast of snow.  Reported he is taking 1-2 tablets of Oxycodone 5 mg bid.  Described pain as a burning type pain in the incisions when he initially stands to walk.  Reported swelling in the left foot, but not in the left leg.  Stated the incisions look good, and appear to be healing.  Denied redness or drainage of incisions.  Denied fever/ chills.  Questioned if pt. Had used all his pain medication, since it was filled on 05/07/15?  Reported he has about 14 tablets left.  Advised it would be too early to refill the narcotic.  Encouraged to take ES Tylenol in between his AM and PM dose of Oxycodone.  Pt. Stated "I don't have any ES Tylenol around, since I never use it."  Reiterated to pt. That it is too soon to refill the narcotic that was prescribed 3 days ago.  Verb. Understanding.

## 2015-05-11 ENCOUNTER — Encounter: Payer: Self-pay | Admitting: Surgery

## 2015-05-11 ENCOUNTER — Other Ambulatory Visit: Payer: Self-pay

## 2015-05-11 MED ORDER — GLUCOSE BLOOD VI STRP: ORAL_STRIP | Status: DC

## 2015-05-17 ENCOUNTER — Other Ambulatory Visit: Payer: Self-pay | Admitting: Cardiology

## 2015-05-18 NOTE — Telephone Encounter (Signed)
Primary care has been refilling this medication for the patient. Ok to reorder under Dr Aundra Dubin? Please advise. Thanks, MI

## 2015-05-18 NOTE — Telephone Encounter (Signed)
yes

## 2015-05-21 ENCOUNTER — Ambulatory Visit (INDEPENDENT_AMBULATORY_CARE_PROVIDER_SITE_OTHER): Payer: Commercial Managed Care - HMO | Admitting: Surgery

## 2015-05-21 ENCOUNTER — Encounter: Payer: Self-pay | Admitting: Surgery

## 2015-05-21 VITALS — BP 145/83 | HR 82 | Temp 97.6°F | Resp 16 | Ht 70.0 in | Wt 186.0 lb

## 2015-05-21 DIAGNOSIS — I739 Peripheral vascular disease, unspecified: Secondary | ICD-10-CM

## 2015-05-21 MED ORDER — SILVER SULFADIAZINE 1 % EX CREA: 1.0000 "application " | TOPICAL_CREAM | Freq: Every day | CUTANEOUS | Status: DC

## 2015-05-21 NOTE — Progress Notes (Signed)
Filed Vitals:   05/21/15 1504 05/21/15 1509  BP: 148/89 145/83  Pulse: 81 82  Temp: 97.6 F (36.4 C)   TempSrc: Oral   Resp: 16   Height: 5\' 10"  (1.778 m)   Weight: 186 lb (84.369 kg)   SpO2: 96%

## 2015-05-21 NOTE — Progress Notes (Signed)
History of Present Illness:  Patient is a 60 y.o. year old male who presents for evaluation of left LE s/p fem-pop by pass  05/04/2015.  He continues to have anterior ankle, lateral leg and lateral foot ulcers.  They are showing improvement.  He is bathing and applying dry guaze to the areas daily.  He also reports increased mobility.  .  Other medical problems include has Type 2 diabetes mellitus with vascular disease ([place]); Hyperlipidemia with target LDL less than 100; Essential hypertension; CAD, NATIVE VESSEL; Encounter for long-term (current) use of other medications; Peripheral vascular disease, unspecified ([place]); Osteoarthritis of right hip; PAD (peripheral artery disease) ([place]); Hypertriglyceridemia; Left carotid bruit; Colon cancer screening; Diabetic ulcer of left foot with necrosis of muscle ([place]); Cellulitis and abscess of leg, except foot; Acute osteomyelitis of left foot ([place]); Septic arthritis of left foot ([place]); Uncontrolled diabetes mellitus type 2 with peripheral artery disease ([place]); Dyslipidemia associated with type 2 diabetes mellitus ([place]); Osteomyelitis ([place]); Diabetic foot ([place]); Sleep apnea; and Atherosclerosis of native arteries of other extremities with ulceration ([place]) on his problem list.  Past Medical History  Diagnosis Date  . Essential hypertension 04/16/2008    Qualifier: Diagnosis of  By: Aundra Dubin, MD, [place]    . Hyperlipidemia with target LDL less than 100 04/16/2008        . Hypertriglyceridemia 03/26/2014  . PAD (peripheral artery disease) ([place]) 03/26/2014  . Peripheral vascular disease, unspecified ([place]) 11/17/2011  . Type 2 diabetes mellitus with vascular disease ([place]) 06/22/2009    Qualifier: Diagnosis of  By: Karrie Meres RN, BSN, Anne    . Uncontrolled diabetes mellitus type 2 with peripheral artery disease ([place]) 03/16/2015  . Acute osteomyelitis of left foot ([place]) 03/16/2015  . Cellulitis and abscess of leg, except foot 02/21/2015  . Colon cancer  screening 01/30/2015  . Diabetic ulcer of left foot with necrosis of muscle ([place]) 02/21/2015  . Dyslipidemia associated with type 2 diabetes mellitus ([place]) 03/16/2015  . Encounter for long-term (current) use of other medications 10/16/2011  . Left carotid bruit 12/11/2014  . Osteoarthritis of right hip 04/09/2012  . Septic arthritis of left foot ([place]) 03/16/2015  . CAD, NATIVE VESSEL 04/16/2008    Qualifier: Diagnosis of  By: Aundra Dubin, MD, Dalton    . Myocardial infarction ([place])     2009  . Cancer ([place])     non-hodgkins lymphoma  2000  recd radiation    Past Surgical History  Procedure Laterality Date  . Cardiac catheterization      2011  . Leg surgery      on bone above ankle-not sure which bone left leg 1981  . Multiple extractions with alveoloplasty  10/31/2011    Procedure: MULTIPLE EXTRACION WITH ALVEOLOPLASTY;  Surgeon: Lenn Cal, DDS;  Location: [facility];  Service: Oral Surgery;  Laterality: N/A;  Extraction of tooth #'s 2, 4,5,6,7,8,9,10,11,12,13,15,21,22,23,24,25,26,27,28, 29, and 31 with alveoloplasty  . Leg surgery  1981    Bone cyst- Left leg  . Peripheral vascular catheterization N/A 04/05/2015    Procedure: Abdominal Aortogram w/Lower Extremity;  Surgeon: Conrad [place], MD;  Location: [facility];  Service: Cardiovascular;  Laterality: N/A;  . Tonsillectomy    . Femoral-tibial bypass graft Left 05/04/2015    Procedure: LEFT  FEMORAL-BELOW THE KNEE POPLITEAL  ARTERY BYPASS GRAFT USING NON REVERSE LEFT GREATER SAPHENOUS VEIN;  Surgeon: Serafina Mitchell, MD;  Location: [facility];  Service: Vascular;  Laterality: Left;  . Intraoperative  arteriogram Left 05/04/2015    Procedure: INTRA OPERATIVE ARTERIOGRAM TIMES TWO;  Surgeon: Serafina Mitchell, MD;  Location: [facility] OR;  Service: Vascular;  Laterality: Left;    Social History Social History  Substance Use Topics  . Smoking status: Former Smoker -- 0.50 packs/day for 43 years    Types: Cigarettes    Quit date: 04/25/2010  .  Smokeless tobacco: Never Used     Comment: heavy 2nd hand exposue ongoing  . Alcohol Use: No     Comment: former heavy user quit 2011    Family History Family History  Problem Relation Age of Onset  . Heart attack Mother     8's  . Heart disease Mother     Heart disease before age 79  . Hypertension Mother   . Hyperlipidemia Mother   . Peripheral Artery Disease Father   . Hypertension Father   . Hyperlipidemia Father   . Peripheral vascular disease Father   . Heart attack      40's/40's    Allergies  Allergies  Allergen Reactions  . Actos [Pioglitazone]     Rash and edema   . Fenofibrate Other (See Comments)    Joint pain/cramping/burning  . Chlorhexidine Gluconate Rash  . Orange Oil Rash  . Soap Other (See Comments)    States that most soaps make him break out/rash-he uses a sensitive skin soap and shampoos as well, has to use specific allergen free soap and shampoo     Current Outpatient Prescriptions  Medication Sig Dispense Refill  . aspirin 81 MG tablet Take 81 mg by mouth daily.    . clopidogrel (PLAVIX) 75 MG tablet Take 1 tablet (75 mg total) by mouth daily. 90 tablet 2  . glimepiride (AMARYL) 1 MG tablet Take 1 tablet (1 mg total) by mouth daily with breakfast. 90 tablet 1  . glucose blood (COOL BLOOD GLUCOSE TEST STRIPS) test strip Use as instructed to test blood sugar up to twice a day. DX: E11.09 200 each 3  . Icosapent Ethyl (VASCEPA) 1 G CAPS Take 2,000 mg by mouth daily.     . Insulin Glargine (TOUJEO SOLOSTAR) 300 UNIT/ML SOPN Inject 55 Units into the skin daily. (Patient taking differently: Inject 50 Units into the skin daily. ) 1.5 mL 11  . Insulin Pen Needle (NOVOFINE) 32G X 6 MM MISC 1 Act by Does not apply route daily. Use 1 QD 100 each 3  . isosorbide mononitrate (IMDUR) 30 MG 24 hr tablet Take 1 tablet (30 mg total) by mouth 2 (two) times daily. 180 tablet 3  . lisinopril (PRINIVIL,ZESTRIL) 40 MG tablet Take 1 tablet (40 mg total) by mouth  daily. 90 tablet 2  . metFORMIN (GLUCOPHAGE) 500 MG tablet Take 2 tablets (1,000 mg total) by mouth 2 (two) times daily with a meal. 360 tablet 1  . metoprolol (LOPRESSOR) 50 MG tablet Take 50 mg by mouth 2 (two) times daily.    . naproxen (NAPROSYN) 375 MG tablet Take 375 mg by mouth 2 (two) times daily with a meal.    . pravastatin (PRAVACHOL) 80 MG tablet Take 1 tablet (80 mg total) by mouth daily. 90 tablet 3  . TRUEPLUS LANCETS 28G MISC Use to test blood sugar twice a day. DX: E11.51 200 each 1  . amoxicillin-clavulanate (AUGMENTIN) 875-125 MG tablet Take 1 tablet by mouth 2 (two) times daily. (Patient not taking: Reported on 05/21/2015) 14 tablet 0  . oxyCODONE (OXY IR/ROXICODONE) 5 MG immediate release tablet  Take 1-2 tablets (5-10 mg total) by mouth every 4 (four) hours as needed for moderate pain. (Patient not taking: Reported on 05/21/2015) 30 tablet 0   No current facility-administered medications for this visit.      Physical Examination  Filed Vitals:   05/21/15 1504 05/21/15 1509  BP: 148/89 145/83  Pulse: 81 82  Temp: 97.6 F (36.4 C)   TempSrc: Oral   Resp: 16   Height: 5\' 10"  (1.778 m)   Weight: 186 lb (84.369 kg)   SpO2: 96%     Body mass index is 26.69 kg/(m^2).  General:  Alert and oriented, no acute distress  Pulmonary: Clear to auscultation bilaterally Cardiac: Regular Rate and Rhythm without murmur  Skin: Left foot and ankle dorsum yellow eschar 3 cm X 1.5 cm. Lateral ankle 1 cm x 1 cm Lateral 5th toe and base of fifth metatarsal.   Extremity Pulses:  2+ radial, brachial, non palpable femoral, dorsalis pedis, posterior tibial pulses bilaterally   DATA:  05/06/2015 ABI  right 0.65 Left 0.78 monophasic flow bilaterally   ASSESSMENT:   The patient presents with critical limb ischemia and a nonhealing wound.  S/P Left femoral to below knee popliteal artery bypass graft with non-reversed ipsilateral greater saphenous vein with intraoperative  angiogram  PLAN:   silvadene cream will be started over the ulcers daily then covered with dry dressings.   Elevation when at rest to decrese swelling He will f/u in 3 weeks we will repeat a left LE arterial duplex.  Theda Sers, EMMA [facility] PA-C [facility] [place] [place] Office: [phone]  The patient was seen in conjunction with Dr. Trula Slade.   The patient is status post left femoral-popliteal bypass graft with non-reversed ipsilateral saphenous vein.  I did have to revise his distal anastomosis based on the intraoperative angiogram.  Postoperatively, his ABIs came up to 0.78.  He continues to have wounds on his left leg but they are improving.  He has just been putting dry gauze over them.  He refuses to go to the wound center because of previous experiences.  I have recommended placing him back on Silvadene to the eschar.  I want him to follow up in 3 weeks so I can reevaluate the wound.  I'm also going to get a lower extremity duplex to see if any intervention needs to be performed on the bypass as I would expect his ABIs to be higher.  Annamarie Major

## 2015-05-22 NOTE — Addendum Note (Signed)
Addended by: Dorthula Rue L on: 05/22/2015 09:40 AM   Modules accepted: Orders

## 2015-05-25 ENCOUNTER — Encounter: Payer: Self-pay | Admitting: Cardiology

## 2015-05-25 ENCOUNTER — Other Ambulatory Visit: Payer: Self-pay

## 2015-05-25 MED ORDER — PRAVASTATIN SODIUM 80 MG PO TABS: 80.0000 mg | ORAL_TABLET | Freq: Every day | ORAL | Status: DC

## 2015-05-25 MED ORDER — GLUCOSE BLOOD VI STRP: ORAL_STRIP | Status: DC

## 2015-05-25 MED ORDER — CILOSTAZOL 100 MG PO TABS: 100.0000 mg | ORAL_TABLET | Freq: Two times a day (BID) | ORAL | Status: DC

## 2015-05-25 MED ORDER — GLUCOSE BLOOD VI STRP: ORAL_STRIP | Status: AC

## 2015-05-25 NOTE — Telephone Encounter (Signed)
Placed call to patient patient regarding medication refills.  Did not see Pletal on medication list.  Patient confirmed that he is still on this medication.  Reminded patient about Dr. Aundra Dubin suggesting lipid clinic referral at last [place] in November.  This was actually scheduled, but cancelled by patient.  Reason given that he had just gotten out of hospital, will call back to reschedule.  Patient stated that his PMD was working on this & was trying some other things like adding the Fish Oil.  Will give 90 day with future refills from pmd as patient states they are managing this.  Pletal filled for 90 day with refills also to [facility].

## 2015-05-28 ENCOUNTER — Other Ambulatory Visit: Payer: Self-pay | Admitting: Internal Medicine

## 2015-06-05 ENCOUNTER — Telehealth: Payer: Self-pay

## 2015-06-05 NOTE — Telephone Encounter (Signed)
I spoke with [name] to set up appointment for this Friday 06/08/15.  Debbie- he is [employer] and will need a referral I believe. He has one in place for an appointment on 02/13- but I am not sure if it would cover this appointment.  Thank you! Hinton Dyer

## 2015-06-05 NOTE — Telephone Encounter (Signed)
Phone call from pt.  Reported his left ankle wound is worse;  Stated that the skin is sloughing off frequently.  Denied any fever/ chills.  Denied any peri wound redness.  Stated he has been changing the telfa dressing frequently due to the amt. of drainage, and wrapping with rolled gauze.  Questioned about the drainage; stated the drainage is "black".  Stated that the wound is deeper, and "almost to the bone."   Denied fever/ chills.  Advised will schedule appt. for left LE wound check this week.  Agreed with plan.

## 2015-06-06 ENCOUNTER — Encounter: Payer: Self-pay | Admitting: Family

## 2015-06-08 ENCOUNTER — Ambulatory Visit (INDEPENDENT_AMBULATORY_CARE_PROVIDER_SITE_OTHER): Payer: Commercial Managed Care - HMO | Admitting: Family

## 2015-06-08 ENCOUNTER — Encounter: Payer: Self-pay | Admitting: Family

## 2015-06-08 ENCOUNTER — Ambulatory Visit (HOSPITAL_COMMUNITY)
Admission: RE | Admit: 2015-06-08 | Discharge: 2015-06-08 | Disposition: A | Discharge status: Home / Self Care | Payer: Commercial Managed Care - HMO | Source: Ambulatory Visit | Attending: Family | Admitting: Family

## 2015-06-08 VITALS — BP 158/94 | HR 83 | Temp 98.6°F | Ht 70.0 in | Wt 186.0 lb

## 2015-06-08 DIAGNOSIS — Z720 Tobacco use: Secondary | ICD-10-CM

## 2015-06-08 DIAGNOSIS — I739 Peripheral vascular disease, unspecified: Secondary | ICD-10-CM

## 2015-06-08 DIAGNOSIS — Z95828 Presence of other vascular implants and grafts: Secondary | ICD-10-CM

## 2015-06-08 DIAGNOSIS — E1151 Type 2 diabetes mellitus with diabetic peripheral angiopathy without gangrene: Secondary | ICD-10-CM | POA: Insufficient documentation

## 2015-06-08 DIAGNOSIS — R0989 Other specified symptoms and signs involving the circulatory and respiratory systems: Secondary | ICD-10-CM | POA: Diagnosis present

## 2015-06-08 DIAGNOSIS — I10 Essential (primary) hypertension: Secondary | ICD-10-CM | POA: Insufficient documentation

## 2015-06-08 DIAGNOSIS — E785 Hyperlipidemia, unspecified: Secondary | ICD-10-CM | POA: Diagnosis not present

## 2015-06-08 DIAGNOSIS — F17200 Nicotine dependence, unspecified, uncomplicated: Secondary | ICD-10-CM

## 2015-06-08 NOTE — Progress Notes (Signed)
[facility] [place] HISTORY AND PHYSICAL -PAD  History of Present Illness [name] is a 60 y.o. male patient of Dr. Trula Slade who presents for evaluation of left LE s/p fem-pop by pass 05/04/2015. He continues to have anterior ankle, lateral leg and lateral foot ulcers. They are showing improvement. He is bathing and applying dry guaze to the areas daily. He also reports increased mobility. . 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 Trula Slade on 05/21/15. At that time Dr. Stephens Shire assessment was as follows: I did have to revise his distal anastomosis based on the intraoperative angiogram. Postoperatively, his ABIs came up to 0.78. He continues to have wounds on his left leg but they are improving. He has just been putting dry gauze over them. He refuses to go to the wound center because of previous experiences. I have recommended placing him back on Silvadene to the eschar. I want him to follow up in 3 weeks so I can reevaluate the wound. I'm also going to get a lower extremity duplex to see if any intervention needs  to be performed on the bypass as I would expect his ABIs to be higher.   He returns today for c/o continued sloughing of skin and deepening of ulcers of left ankle and foot, states the dressing has a bad smell when his wife removes the dressing from this. He states a wound started on his left ankle after his arteriogram on 04/23/16.   He denies fever or chills, states pain is minimal at 3/10 in his left foot and ankle.  He denies any problems with his right leg.    Pt Diabetic: Yes, states his last A1C was 7.7 Pt smoker: smoker  (1 ppd)  Pt meds include: Statin :Yes Betablocker: Yes ASA: Yes Other anticoagulants/antiplatelets: Plavix, Pletal  Past Medical History  Diagnosis Date  . Essential hypertension 04/16/2008    Qualifier: Diagnosis of  By: Aundra Dubin, MD, [place]    . Hyperlipidemia with target LDL less than 100 04/16/2008        . Hypertriglyceridemia 03/26/2014  . PAD (peripheral artery disease) ([place]) 03/26/2014  . Peripheral vascular disease, unspecified ([place]) 11/17/2011  . Type 2 diabetes mellitus with vascular disease ([place]) 06/22/2009    Qualifier: Diagnosis of  By: Karrie Meres RN, BSN, Anne    . Uncontrolled diabetes mellitus type 2 with peripheral artery disease ([place]) 03/16/2015  . Acute osteomyelitis of left foot ([place]) 03/16/2015  . Cellulitis and abscess of leg, except foot 02/21/2015  . Colon cancer screening 01/30/2015  . 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 Dyslipidemia associated with type 2 diabetes mellitus ([place]) 03/16/2015  . Encounter for long-term (current) use of other medications 10/16/2011  . Left carotid bruit 12/11/2014  . Osteoarthritis  of right hip 04/09/2012  . Septic arthritis of left foot ([place]) 03/16/2015  . CAD, NATIVE VESSEL 04/16/2008    Qualifier: Diagnosis of  By: Aundra Dubin, MD, [place]    . Myocardial infarction ([place])     2009  . Cancer ([place])     non-hodgkins lymphoma  2000  recd radiation    Social  History Social History  Substance Use Topics  . Smoking status: Former Smoker -- 0.50 packs/day for 43 years    Types: Cigarettes    Quit date: 04/25/2010  . Smokeless tobacco: Never Used     Comment: heavy 2nd hand exposue ongoing  . Alcohol Use: No     Comment: former heavy user quit 2011    Family History Family History  Problem Relation Age of Onset  . Heart attack Mother     71's  . Heart disease Mother     Heart disease before age 42  . Hypertension Mother   . Hyperlipidemia Mother   . Peripheral Artery Disease Father   . Hypertension Father   . Hyperlipidemia Father   . Peripheral vascular disease Father   . Heart attack      40's/40's    Past Surgical History  Procedure Laterality Date  . Cardiac catheterization      2011  . Leg surgery      on bone above ankle-not sure which bone left leg 1981  . Multiple extractions with alveoloplasty  10/31/2011    Procedure: MULTIPLE EXTRACION WITH ALVEOLOPLASTY;  Surgeon: Lenn Cal, DDS;  Location: [facility];  Service: Oral Surgery;  Laterality: N/A;  Extraction of tooth #'s 2, 4,5,6,7,8,9,10,11,12,13,15,21,22,23,24,25,26,27,28, 29, and 31 with alveoloplasty  . Leg surgery  1981    Bone cyst- Left leg  . Peripheral vascular catheterization N/A 04/05/2015    Procedure: Abdominal Aortogram w/Lower Extremity;  Surgeon: Conrad Lemitar, MD;  Location: [facility];  Service: Cardiovascular;  Laterality: N/A;  . Tonsillectomy    . Femoral-tibial bypass graft Left 05/04/2015    Procedure: LEFT  FEMORAL-BELOW THE KNEE POPLITEAL  ARTERY BYPASS GRAFT USING NON REVERSE LEFT GREATER SAPHENOUS VEIN;  Surgeon: Serafina Mitchell, MD;  Location: [facility];  Service: Vascular;  Laterality: Left;  . Intraoperative arteriogram Left 05/04/2015    Procedure: INTRA OPERATIVE ARTERIOGRAM TIMES TWO;  Surgeon: Serafina Mitchell, MD;  Location: [facility];  Service: Vascular;  Laterality: Left;    Allergies  Allergen Reactions  . Actos [Pioglitazone]      Rash and edema   . Fenofibrate Other (See Comments)    Joint pain/cramping/burning  . Chlorhexidine Gluconate Rash  . Orange Oil Rash  . Soap Other (See Comments)    States that most soaps make him break out/rash-he uses a sensitive skin soap and shampoos as well, has to use specific allergen free soap and shampoo    Current Outpatient Prescriptions  Medication Sig Dispense Refill  . aspirin 81 MG tablet Take 81 mg by mouth daily.    . cilostazol (PLETAL) 100 MG tablet Take 1 tablet (100 mg total) by mouth 2 (two) times daily. 180 tablet 3  . clopidogrel (PLAVIX) 75 MG tablet Take 1 tablet (75 mg total) by mouth daily. 90 tablet 2  . glimepiride (AMARYL) 1 MG tablet Take 1 tablet (1 mg total) by mouth daily with breakfast. 90 tablet 1  . glucose blood (COOL BLOOD GLUCOSE TEST STRIPS) test strip Use as instructed to test blood sugar up to twice a day.  DX: E11.09 200 each 3  . glucose blood (COOL BLOOD GLUCOSE TEST STRIPS) test strip Use to check blood sugar twice a day  DX. E11.51 100 each 12  . Icosapent Ethyl (VASCEPA) 1 G CAPS Take 2,000 mg by mouth daily.     . Insulin Pen Needle (NOVOFINE) 32G X 6 MM MISC 1 Act by Does not apply route daily. Use 1 QD 100 each 3  . isosorbide mononitrate (IMDUR) 30 MG 24 hr tablet Take 1 tablet (30 mg total) by mouth 2 (two) times daily. 180 tablet 3  . lisinopril (PRINIVIL,ZESTRIL) 40 MG tablet Take 1 tablet (40 mg total) by mouth daily. 90 tablet 2  . metFORMIN (GLUCOPHAGE) 500 MG tablet Take 2 tablets (1,000 mg total) by mouth 2 (two) times daily with a meal. 360 tablet 1  . metoprolol (LOPRESSOR) 50 MG tablet Take 50 mg by mouth 2 (two) times daily.    . naproxen (NAPROSYN) 375 MG tablet Take 375 mg by mouth 2 (two) times daily with a meal.    . oxyCODONE (OXY IR/ROXICODONE) 5 MG immediate release tablet Take 1-2 tablets (5-10 mg total) by mouth every 4 (four) hours as needed for moderate pain. 30 tablet 0  . pravastatin (PRAVACHOL) 80 MG tablet  Take 1 tablet (80 mg total) by mouth daily. 90 tablet 0  . silver sulfADIAZINE (SILVADENE) 1 % cream Apply 1 application topically daily. Left leg and foot ulcer area daily 20 g 0  . TOUJEO SOLOSTAR 300 UNIT/ML SOPN INJECT  50 UNITS SUBCUTANEOUSLY EVERY DAY 5 mL 11  . TRUEPLUS LANCETS 28G MISC Use to test blood sugar twice a day. DX: E11.51 200 each 1  . amoxicillin-clavulanate (AUGMENTIN) 875-125 MG tablet Take 1 tablet by mouth 2 (two) times daily. (Patient not taking: Reported on 06/08/2015) 14 tablet 0   No current facility-administered medications for this visit.    ROS: See HPI for pertinent positives and negatives.   Physical Examination  Filed Vitals:   06/08/15 1520 06/08/15 1521  BP: 172/93 158/94  Pulse: 83   Temp: 98.6 F (37 C)   TempSrc: Oral   Height: 5\' 10"  (1.778 m)   Weight: 186 lb (84.369 kg)   SpO2: 97%    Body mass index is 26.69 kg/(m^2).  General: A&O x 3, WDWN. Gait: not observed Eyes: PERRLA. Pulmonary: CTAB, without wheezes , rales or rhonchi. Cardiac: regular Rythm , without detected murmur.         Carotid Bruits Right Left   Negative Negative  Aorta is not palpable. Radial pulses: 2+                           VASCULAR EXAM: Extremities with ischemic changes: several areas of ulcers around left ankle and foot, some with dark sloughing eschar, foul smelling, no purulent drainage                                                                                                           LE  Pulses Right Left       FEMORAL   palpable   palpable        POPLITEAL  not palpable   not palpable       POSTERIOR TIBIAL  not palpable   not palpable, +audible doppler signal        DORSALIS PEDIS      ANTERIOR TIBIAL faintly palpable  not palpable, +audible Doppler signal        PERONEAL not Palpable   not Palpable, +audible Doppler signal    Abdomen: soft, NT, no palpable masses. Skin: see Extremities Musculoskeletal: no muscle wasting or  atrophy.  Neurologic: A&O X 3; Appropriate Affect ; SENSATION: normal; MOTOR FUNCTION:  moving all extremities equally, motor strength 5/5 throughout. Speech is fluent/normal.  CN 2-12 grossly intact.    Non-Invasive Vascular Imaging: DATE: 06/08/2015 LOWER EXTREMITY ARTERIAL DUPLEX EVALUATION    INDICATION: Burn on left lower extremity following x-ray.      PREVIOUS INTERVENTION(S): Left femoral-popliteal bypass graft placed 05/04/2015.    DUPLEX EXAM:     RIGHT  LEFT   Peak Systolic Velocity (cm/s) Ratio (if abnormal) Waveform  Peak Systolic Velocity (cm/s) Ratio (if abnormal) Waveform     Inflow Artery 121  T     Proximal Anastomosis 87  M     Proximal Graft 83  M     Mid Graft 79  M      Distal Graft 86  M     Distal Anastomosis 49  M     Outflow Artery 40  M   Today's ABI / TBI    Previous ABI / TBI (  )     Waveform:    M - Monophasic       B - Biphasic       T - Triphasic  If Ankle Brachial Index (ABI) or Toe Brachial Index (TBI) performed, please see complete report     ADDITIONAL FINDINGS: Difficult to visualize mid-distal graft.    IMPRESSION: Visualized graft segments appear patent with no areas of stenosis.   Proximal posterior tibial artery demonstrates elevated peak systolic velocities with of 232 with a ratio (145/232) of 1.6 consistent with a less that 50% diameter reduction.    Compared to the previous exam:  No previous examinations available for comparison.     ASSESSMENT: [name] is a 60 y.o. male who is s/p left femoral-popliteal bypass graft placed 05/04/2015. Pt reports worsening, deepening skin ulcers of his left foot and ankle. He states his pain is minimal. Some ulcers are moist and fibrinous, some have a dark dry eschar that appear to be sloughing.  He has Dopplerable left pedal pulses: DP, PT, and peroneal.   He has uncontrolled DM and smokes 1 PPD.   Dr. Bridgett Larsson spoke with and examined pt. Pt's body is lysing the necrotic tissue and  wounds are healing as hoped.   PLAN:   [place] wound care dressing changes twice daily by Home Health or wound care center. Patient chose [facility] center Based on the patient's vascular studies and examination, pt will return to clinic on 06/18/15 as already scheduled with Dr. Trula Slade.  I discussed in depth with the patient the nature of atherosclerosis, and emphasized the importance of maximal medical management including strict control of blood pressure, blood glucose, and lipid levels, obtaining regular exercise, and cessation of smoking.  The patient is aware that without maximal medical management the underlying atherosclerotic disease process will  progress, limiting the benefit of any interventions.  The patient was given information about PAD including signs, symptoms, treatment, what symptoms should prompt the patient to seek immediate medical care, and risk reduction measures to take.  Clemon Chambers, RN, MSN, FNP-C [facility] Phone: [phone]  Clinic MD: Bridgett Larsson  06/08/2015 3:39 PM

## 2015-06-08 NOTE — Patient Instructions (Addendum)

## 2015-06-11 ENCOUNTER — Telehealth: Payer: Self-pay | Admitting: Family

## 2015-06-11 NOTE — Telephone Encounter (Signed)
[name] is refusing all wound care.  He has refused home health.  He refuses the [facility].  He refuses Dr. Sharol Given.  He refuses [facility].  On Friday, 06/07/14, Vinnie Level and Dr. Bridgett Larsson asked me to find a solution.  I could not make the appointments here because he needs a dressing change twice a day.  I asked to see if we could teach the patient's wife how to do the dressing change so she could do it at home, but as per [facility] the patient needs more intensive wound care than that.   When I learned that his refusal of [facility] was due to issues with his insurance, I explained to him that with [name], [facility] is in network as it is a [facility] facility, and [name] would have no choice but to get an authorization before seeing him. If they were not able to get an authorization, [facility] would have to inform him.  The patient agreed to go to [facility].  Today, [facility] called to inform me that the patient refused to make an appointment because it "is too far away", despite already knowing that Selena Lesser is in [place] when I spoke to him on Friday.  The patient has refused all appointments and we are exhausted of options.

## 2015-06-11 NOTE — Telephone Encounter (Signed)
This patient is primarily managed by Dr. Trula Slade.  I would defer further clinical decision making to just him to limit the confusion in this patient's care.

## 2015-06-12 ENCOUNTER — Encounter: Payer: Self-pay | Admitting: Surgery

## 2015-06-18 ENCOUNTER — Encounter: Payer: Self-pay | Admitting: Surgery

## 2015-06-18 ENCOUNTER — Ambulatory Visit (INDEPENDENT_AMBULATORY_CARE_PROVIDER_SITE_OTHER): Payer: Commercial Managed Care - HMO | Admitting: Surgery

## 2015-06-18 ENCOUNTER — Encounter (HOSPITAL_COMMUNITY): Payer: Self-pay

## 2015-06-18 VITALS — BP 134/81 | HR 88 | Temp 97.9°F | Resp 16 | Ht 69.5 in | Wt 185.0 lb

## 2015-06-18 DIAGNOSIS — Z95828 Presence of other vascular implants and grafts: Secondary | ICD-10-CM

## 2015-06-18 NOTE — Progress Notes (Signed)
Patient name: [name] MRN: [id number] DOB: [birth year] Sex: male     Chief Complaint  Patient presents with  . Routine Post Op    S/P Fem-Tib  BPG [id number]  f/u    HISTORY OF PRESENT ILLNESS: The patient is back for follow-up of his left lower extremity ulcers, which began several months ago. He has been in and out of the hospital for IV antibiotics. He underwent angiography by Dr. Bridgett Larsson on December 1. This showed occlusive disease involving the superficial femoral and popliteal artery.  On 05/04/2015 he underwent a left femoral to below-knee popliteal artery bypass graft with non-reversed ipsilateral greater saphenous vein.  He does not want to go to the wound center in [place] is out of his insurance coverage.  Therefore he is doing his dressing changes at home.  He is pleased with the progress.  Past Medical History  Diagnosis Date  . Essential hypertension 04/16/2008    Qualifier: Diagnosis of  By: Aundra Dubin, MD, [place]    . Hyperlipidemia with target LDL less than 100 04/16/2008        . Hypertriglyceridemia 03/26/2014  . PAD (peripheral artery disease) ([place]) 03/26/2014  . Peripheral vascular disease, unspecified ([place]) 11/17/2011  . Type 2 diabetes mellitus with vascular disease ([place]) 06/22/2009    Qualifier: Diagnosis of  By: Karrie Meres RN, BSN, Anne    . Uncontrolled diabetes mellitus type 2 with peripheral artery disease ([place]) 03/16/2015  . Acute osteomyelitis of left foot ([place]) 03/16/2015  . Cellulitis and abscess of leg, except foot 02/21/2015  . Colon cancer screening 01/30/2015  . 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 Dyslipidemia associated with type 2 diabetes mellitus ([place]) 03/16/2015  . Encounter for long-term (current) use of other medications 10/16/2011  . Left carotid bruit 12/11/2014  . Osteoarthritis of right hip 04/09/2012  . Septic arthritis of left foot ([place]) 03/16/2015  . CAD, NATIVE VESSEL 04/16/2008   Qualifier: Diagnosis of  By: Aundra Dubin, MD, [place]    . Myocardial infarction ([place])     2009  . Cancer ([place])     non-hodgkins lymphoma  2000  recd radiation    Past Surgical History  Procedure Laterality Date  . Cardiac catheterization      2011  . Leg surgery      on bone above ankle-not sure which bone left leg 1981  . Multiple extractions with alveoloplasty  10/31/2011    Procedure: MULTIPLE EXTRACION WITH ALVEOLOPLASTY;  Surgeon: Lenn Cal, DDS;  Location: [facility];  Service: Oral Surgery;  Laterality: N/A;  Extraction of tooth #'s 2, 4,5,6,7,8,9,10,11,12,13,15,21,22,23,24,25,26,27,28, 29, and 31 with alveoloplasty  . Leg surgery  1981    Bone cyst- Left leg  . Peripheral vascular catheterization N/A 04/05/2015    Procedure: Abdominal Aortogram w/Lower Extremity;  Surgeon: Conrad Saulsbury, MD;  Location: [facility];  Service: Cardiovascular;  Laterality: N/A;  . Tonsillectomy    . Femoral-tibial bypass graft Left 05/04/2015    Procedure: LEFT  FEMORAL-BELOW THE KNEE POPLITEAL  ARTERY BYPASS GRAFT USING NON REVERSE LEFT GREATER SAPHENOUS VEIN;  Surgeon: Serafina Mitchell, MD;  Location: [facility];  Service: Vascular;  Laterality: Left;  . Intraoperative arteriogram Left 05/04/2015    Procedure: INTRA OPERATIVE ARTERIOGRAM TIMES TWO;  Surgeon: Serafina Mitchell, MD;  Location: [facility];  Service: Vascular;  Laterality: Left;    Social History   Social History  . Marital Status:  Married    Spouse Name: N/A  . Number of Children: N/A  . Years of Education: N/A   Occupational History  .      Disabled. Former Biomedical scientist.   Social History Main Topics  . Smoking status: Light Tobacco Smoker -- 0.50 packs/day for 43 years    Types: Cigarettes  . Smokeless tobacco: Never Used     Comment: heavy 2nd hand exposue ongoing  . Alcohol Use: No     Comment: former heavy user quit 2011  . Drug Use: No  . Sexual Activity: Not on file   Other Topics Concern  . Not on file   Social History  Narrative   The patient is married. The patient has no children.   Patient with a history of smoking one pack per day for 43 years. Patient started smoking at the age of 29.   Patient no longer drinks alcohol. Patient quit proximally one and a half years ago from a previously heavy alcohol use.    Family History  Problem Relation Age of Onset  . Heart attack Mother     26's  . Heart disease Mother     Heart disease before age 34  . Hypertension Mother   . Hyperlipidemia Mother   . Peripheral Artery Disease Father   . Hypertension Father   . Hyperlipidemia Father   . Peripheral vascular disease Father   . Heart attack      40's/40's    Allergies as of 06/18/2015 - Review Complete 06/18/2015  Allergen Reaction Noted  . Actos [pioglitazone]  10/13/2011  . Fenofibrate Other (See Comments) 02/04/2012  . Chlorhexidine gluconate Rash 10/31/2011  . Orange oil Rash 01/19/2012  . Soap Other (See Comments) 10/28/2011    Current Outpatient Prescriptions on File Prior to Visit  Medication Sig Dispense Refill  . aspirin 81 MG tablet Take 81 mg by mouth daily.    . cilostazol (PLETAL) 100 MG tablet Take 1 tablet (100 mg total) by mouth 2 (two) times daily. 180 tablet 3  . clopidogrel (PLAVIX) 75 MG tablet Take 1 tablet (75 mg total) by mouth daily. 90 tablet 2  . glimepiride (AMARYL) 1 MG tablet Take 1 tablet (1 mg total) by mouth daily with breakfast. 90 tablet 1  . glucose blood (COOL BLOOD GLUCOSE TEST STRIPS) test strip Use as instructed to test blood sugar up to twice a day. DX: E11.09 200 each 3  . glucose blood (COOL BLOOD GLUCOSE TEST STRIPS) test strip Use to check blood sugar twice a day  DX. E11.51 100 each 12  . Icosapent Ethyl (VASCEPA) 1 G CAPS Take 2,000 mg by mouth daily.     . Insulin Pen Needle (NOVOFINE) 32G X 6 MM MISC 1 Act by Does not apply route daily. Use 1 QD 100 each 3  . isosorbide mononitrate (IMDUR) 30 MG 24 hr tablet Take 1 tablet (30 mg total) by mouth 2 (two)  times daily. 180 tablet 3  . lisinopril (PRINIVIL,ZESTRIL) 40 MG tablet Take 1 tablet (40 mg total) by mouth daily. 90 tablet 2  . metFORMIN (GLUCOPHAGE) 500 MG tablet Take 2 tablets (1,000 mg total) by mouth 2 (two) times daily with a meal. 360 tablet 1  . metoprolol (LOPRESSOR) 50 MG tablet Take 50 mg by mouth 2 (two) times daily.    . naproxen (NAPROSYN) 375 MG tablet Take 375 mg by mouth 2 (two) times daily with a meal.    . pravastatin (PRAVACHOL) 80 MG  tablet Take 1 tablet (80 mg total) by mouth daily. 90 tablet 0  . silver sulfADIAZINE (SILVADENE) 1 % cream Apply 1 application topically daily. Left leg and foot ulcer area daily 20 g 0  . TOUJEO SOLOSTAR 300 UNIT/ML SOPN INJECT  50 UNITS SUBCUTANEOUSLY EVERY DAY 5 mL 11  . TRUEPLUS LANCETS 28G MISC Use to test blood sugar twice a day. DX: E11.51 200 each 1  . amoxicillin-clavulanate (AUGMENTIN) 875-125 MG tablet Take 1 tablet by mouth 2 (two) times daily. (Patient not taking: Reported on 06/08/2015) 14 tablet 0  . oxyCODONE (OXY IR/ROXICODONE) 5 MG immediate release tablet Take 1-2 tablets (5-10 mg total) by mouth every 4 (four) hours as needed for moderate pain. (Patient not taking: Reported on 06/18/2015) 30 tablet 0   No current facility-administered medications on file prior to visit.      PHYSICAL EXAMINATION:   Vital signs are  Filed Vitals:   06/18/15 1541  BP: 134/81  Pulse: 88  Temp: 97.9 F (36.6 C)  TempSrc: Oral  Resp: 16  Height: 5' 9.5" (1.765 m)  Weight: 185 lb (83.915 kg)  SpO2: 96%   Body mass index is 26.94 kg/(m^2). General: The patient appears their stated age. The patient has a palpable left dorsalis pedis pulse.  His incisions have all healed.  The multiple ulcers on his leg continued to improve   Diagnostic Studies None  Assessment: Left femoral-popliteal bypass graft for ulcer Plan: The patient's ulcers continue to heal.  He has a palpable pulse within his dorsalis pedis.  He does not want to go to  the wound center.  We will continue with his current dressing regimen.  I will see him back in one month.  He will need a duplex of his bypass graft in 2 months  V. Leia Alf, M.D. [facility] Office: [phone] Pager:  [phone]

## 2015-06-21 ENCOUNTER — Encounter (HOSPITAL_COMMUNITY): Payer: Self-pay

## 2015-06-21 ENCOUNTER — Ambulatory Visit: Payer: Self-pay | Admitting: Family

## 2015-07-09 ENCOUNTER — Encounter: Payer: Self-pay | Admitting: Surgery

## 2015-07-16 ENCOUNTER — Encounter: Payer: Self-pay | Admitting: Surgery

## 2015-07-16 ENCOUNTER — Ambulatory Visit (INDEPENDENT_AMBULATORY_CARE_PROVIDER_SITE_OTHER): Payer: Commercial Managed Care - HMO | Admitting: Surgery

## 2015-07-16 VITALS — BP 134/82 | HR 75 | Temp 97.5°F | Resp 24 | Ht 70.0 in | Wt 191.3 lb

## 2015-07-16 DIAGNOSIS — I739 Peripheral vascular disease, unspecified: Secondary | ICD-10-CM

## 2015-07-16 DIAGNOSIS — Z48812 Encounter for surgical aftercare following surgery on the circulatory system: Secondary | ICD-10-CM

## 2015-07-16 DIAGNOSIS — I7025 Atherosclerosis of native arteries of other extremities with ulceration: Secondary | ICD-10-CM | POA: Insufficient documentation

## 2015-07-16 DIAGNOSIS — Z95828 Presence of other vascular implants and grafts: Secondary | ICD-10-CM

## 2015-07-16 NOTE — Progress Notes (Signed)
POST OPERATIVE OFFICE NOTE    CC:  F/u for surgery  HPI:  This is a 60 y.o. male who is s/p On 05/04/2015 he underwent a left femoral to below-knee popliteal artery bypass graft with non-reversed ipsilateral greater saphenous vein.  He is here today for wound check.  He has had multiple ulcers that are trending towards healing since his surgery.  He has been using triple antibiotic ointment and daily with dry dressing.  He showers daily with soap and water.  He does not want to go to the wound center in [place] is out of his insurance coverage. He reports no fever or chills.    Allergies  Allergen Reactions  . Actos [Pioglitazone]     Rash and edema   . Fenofibrate Other (See Comments)    Joint pain/cramping/burning  . Chlorhexidine Gluconate Rash  . Orange Oil Rash  . Soap Other (See Comments)    States that most soaps make him break out/rash-he uses a sensitive skin soap and shampoos as well, has to use specific allergen free soap and shampoo    Current Outpatient Prescriptions  Medication Sig Dispense Refill  . aspirin 81 MG tablet Take 81 mg by mouth daily.    . cilostazol (PLETAL) 100 MG tablet Take 1 tablet (100 mg total) by mouth 2 (two) times daily. 180 tablet 3  . clopidogrel (PLAVIX) 75 MG tablet Take 1 tablet (75 mg total) by mouth daily. 90 tablet 2  . glimepiride (AMARYL) 1 MG tablet Take 1 tablet (1 mg total) by mouth daily with breakfast. 90 tablet 1  . glucose blood (COOL BLOOD GLUCOSE TEST STRIPS) test strip Use as instructed to test blood sugar up to twice a day. DX: E11.09 200 each 3  . glucose blood (COOL BLOOD GLUCOSE TEST STRIPS) test strip Use to check blood sugar twice a day  DX. E11.51 100 each 12  . Icosapent Ethyl (VASCEPA) 1 G CAPS Take 2,000 mg by mouth daily.     . Insulin Pen Needle (NOVOFINE) 32G X 6 MM MISC 1 Act by Does not apply route daily. Use 1 QD 100 each 3  . isosorbide mononitrate (IMDUR) 30 MG 24 hr tablet Take 1 tablet (30  mg total) by mouth 2 (two) times daily. 180 tablet 3  . lisinopril (PRINIVIL,ZESTRIL) 40 MG tablet Take 1 tablet (40 mg total) by mouth daily. 90 tablet 2  . metFORMIN (GLUCOPHAGE) 500 MG tablet Take 2 tablets (1,000 mg total) by mouth 2 (two) times daily with a meal. 360 tablet 1  . metoprolol (LOPRESSOR) 50 MG tablet Take 50 mg by mouth 2 (two) times daily.    . naproxen (NAPROSYN) 375 MG tablet Take 375 mg by mouth 2 (two) times daily with a meal.    . pravastatin (PRAVACHOL) 80 MG tablet Take 1 tablet (80 mg total) by mouth daily. 90 tablet 0  . TOUJEO SOLOSTAR 300 UNIT/ML SOPN INJECT  50 UNITS SUBCUTANEOUSLY EVERY DAY 5 mL 11  . TRUEPLUS LANCETS 28G MISC Use to test blood sugar twice a day. DX: E11.51 200 each 1  . amoxicillin-clavulanate (AUGMENTIN) 875-125 MG tablet Take 1 tablet by mouth 2 (two) times daily. (Patient not taking: Reported on 06/08/2015) 14 tablet 0  . oxyCODONE (OXY IR/ROXICODONE) 5 MG immediate release tablet Take 1-2 tablets (5-10 mg total) by mouth every 4 (four) hours as needed for moderate pain. (Patient not taking: Reported on 06/18/2015) 30 tablet 0  . silver sulfADIAZINE (SILVADENE)  1 % cream Apply 1 application topically daily. Left leg and foot ulcer area daily (Patient not taking: Reported on 07/16/2015) 20 g 0   No current facility-administered medications for this visit.     ROS:  See HPI  Physical Exam:  Filed Vitals:   07/16/15 1353  BP: 134/82  Pulse: 75  Temp: 97.5 F (36.4 C)  Resp: 24    Incision: well healed groin and LE incisions Extremities:  Left  lateral ulcers with beefy red base, small amount of yellow eschar superior wound.  Lateral 5th metatarsal area with maceration other wise clean ulcer.   Palpable DP left foot Heart RRR Lungs clear bil.  Assessment/Plan:  This is a 60 y.o. male who is s/p:  left femoral to below-knee popliteal artery bypass graft with non-reversed ipsilateral greater saphenous vein.  He has slowly healing ulcers  medial and lateral left foot and ankle.  Continue current treatment.  F/U in 2 month with repeat by pass duplex and left LE ABI.       Theda Sers  [facility] PA-C [facility] [phone]  Clinic MD:  Pt seen and examined with Dr. Trula Slade   I agree with the above.  His wounds are healing.  He has palpable pulse.  He will follow-up in 2 months for a wound check as well as a duplex of his bypass.  Annamarie Major

## 2015-07-16 NOTE — Addendum Note (Signed)
Addended by: Mena Goes on: 07/16/2015 04:53 PM   Modules accepted: Orders

## 2015-07-18 ENCOUNTER — Encounter: Payer: Self-pay | Admitting: Cardiology

## 2015-07-20 ENCOUNTER — Other Ambulatory Visit: Payer: Self-pay | Admitting: *Deleted

## 2015-07-24 MED ORDER — ISOSORBIDE MONONITRATE ER 30 MG PO TB24: 30.0000 mg | ORAL_TABLET | Freq: Two times a day (BID) | ORAL | Status: DC

## 2015-08-02 ENCOUNTER — Other Ambulatory Visit (HOSPITAL_COMMUNITY): Payer: Self-pay | Admitting: Pharmacist

## 2015-08-03 ENCOUNTER — Other Ambulatory Visit: Payer: Self-pay

## 2015-08-03 MED ORDER — ISOSORBIDE MONONITRATE ER 30 MG PO TB24: 30.0000 mg | ORAL_TABLET | Freq: Two times a day (BID) | ORAL | Status: DC

## 2015-08-07 ENCOUNTER — Other Ambulatory Visit: Payer: Self-pay | Admitting: *Deleted

## 2015-08-07 ENCOUNTER — Telehealth: Payer: Self-pay | Admitting: Cardiology

## 2015-08-07 MED ORDER — ISOSORBIDE MONONITRATE ER 30 MG PO TB24: 30.0000 mg | ORAL_TABLET | Freq: Two times a day (BID) | ORAL | Status: DC

## 2015-08-07 NOTE — Telephone Encounter (Signed)
Pt rx for isosorbide sent to [facility].

## 2015-08-07 NOTE — Telephone Encounter (Signed)
New Message  Rep from [facility] calling to follow up on pt request to inc pt/s isosorbide to 90 days. Please call back and discuss.

## 2015-08-23 ENCOUNTER — Other Ambulatory Visit: Payer: Self-pay | Admitting: Cardiology

## 2015-08-23 ENCOUNTER — Other Ambulatory Visit: Payer: Self-pay | Admitting: Internal Medicine

## 2015-08-31 ENCOUNTER — Telehealth: Payer: Self-pay | Admitting: Internal Medicine

## 2015-08-31 DIAGNOSIS — E1159 Type 2 diabetes mellitus with other circulatory complications: Secondary | ICD-10-CM

## 2015-08-31 NOTE — Telephone Encounter (Signed)
Pt request referral for new opthalmology at [facility], Dr. Dorthea Cove, phn # [phone]. Please help, he need this done before he can make an appt. [employer] is the insurance.

## 2015-09-01 NOTE — Telephone Encounter (Signed)
done

## 2015-09-07 ENCOUNTER — Encounter: Payer: Self-pay | Admitting: Surgery

## 2015-09-17 ENCOUNTER — Ambulatory Visit (HOSPITAL_COMMUNITY)
Admission: RE | Admit: 2015-09-17 | Discharge: 2015-09-17 | Disposition: A | Discharge status: Home / Self Care | Payer: Commercial Managed Care - HMO | Source: Ambulatory Visit | Attending: Surgery | Admitting: Surgery

## 2015-09-17 ENCOUNTER — Encounter: Payer: Self-pay | Admitting: Surgery

## 2015-09-17 ENCOUNTER — Ambulatory Visit (INDEPENDENT_AMBULATORY_CARE_PROVIDER_SITE_OTHER)
Admission: RE | Admit: 2015-09-17 | Discharge: 2015-09-17 | Disposition: A | Discharge status: Home / Self Care | Payer: Commercial Managed Care - HMO | Source: Ambulatory Visit | Attending: Surgery | Admitting: Surgery

## 2015-09-17 ENCOUNTER — Ambulatory Visit (INDEPENDENT_AMBULATORY_CARE_PROVIDER_SITE_OTHER): Payer: Commercial Managed Care - HMO | Admitting: Surgery

## 2015-09-17 VITALS — BP 134/86 | HR 82 | Temp 98.6°F | Resp 16 | Ht 70.0 in | Wt 187.0 lb

## 2015-09-17 DIAGNOSIS — I739 Peripheral vascular disease, unspecified: Secondary | ICD-10-CM

## 2015-09-17 DIAGNOSIS — E1151 Type 2 diabetes mellitus with diabetic peripheral angiopathy without gangrene: Secondary | ICD-10-CM | POA: Diagnosis not present

## 2015-09-17 DIAGNOSIS — R938 Abnormal findings on diagnostic imaging of other specified body structures: Secondary | ICD-10-CM | POA: Diagnosis not present

## 2015-09-17 DIAGNOSIS — L97529 Non-pressure chronic ulcer of other part of left foot with unspecified severity: Secondary | ICD-10-CM | POA: Insufficient documentation

## 2015-09-17 DIAGNOSIS — E785 Hyperlipidemia, unspecified: Secondary | ICD-10-CM | POA: Diagnosis not present

## 2015-09-17 DIAGNOSIS — E11621 Type 2 diabetes mellitus with foot ulcer: Secondary | ICD-10-CM | POA: Diagnosis not present

## 2015-09-17 DIAGNOSIS — I7025 Atherosclerosis of native arteries of other extremities with ulceration: Secondary | ICD-10-CM

## 2015-09-17 DIAGNOSIS — Z95828 Presence of other vascular implants and grafts: Secondary | ICD-10-CM | POA: Diagnosis not present

## 2015-09-17 DIAGNOSIS — E7800 Pure hypercholesterolemia, unspecified: Secondary | ICD-10-CM | POA: Insufficient documentation

## 2015-09-17 DIAGNOSIS — E1165 Type 2 diabetes mellitus with hyperglycemia: Secondary | ICD-10-CM | POA: Diagnosis not present

## 2015-09-17 DIAGNOSIS — R0989 Other specified symptoms and signs involving the circulatory and respiratory systems: Secondary | ICD-10-CM | POA: Diagnosis present

## 2015-09-17 DIAGNOSIS — I10 Essential (primary) hypertension: Secondary | ICD-10-CM | POA: Insufficient documentation

## 2015-09-17 NOTE — Progress Notes (Signed)
HISTORY AND PHYSICAL     CC:  F/u on surgery Referring Provider:  Janith Lima, MD  HPI: This is a 60 y.o. male who is s/p left femoral to below knee popliteal artery bypass grafting with non reversed ipsilateral greater saphenous vein with intraoperative angiogram on 05/04/15.  He presents today for follow up.  He states that he has been doing really well and is walking without a cane now.  He states that the wounds on his legs are almost healed.  He does continue to smoke.  He no longer has any claudication symptoms.    He does have a moderate left carotid artery stenosis that is followed by Dr. Marigene Ehlers.  His last carotid duplex was in October 2016.  He has not had any stroke sx.    He is on Plavix and aspirin.  He does take insulin, metformin, and amaryl for diabetes.  He takes a beta blocker and ACEI for blood pressure management.    Past Medical History  Diagnosis Date  . Essential hypertension 04/16/2008    Qualifier: Diagnosis of  By: Aundra Dubin, MD, [place]    . Hyperlipidemia with target LDL less than 100 04/16/2008        . Hypertriglyceridemia 03/26/2014  . PAD (peripheral artery disease) ([place]) 03/26/2014  . Peripheral vascular disease, unspecified ([place]) 11/17/2011  . Type 2 diabetes mellitus with vascular disease ([place]) 06/22/2009    Qualifier: Diagnosis of  By: Karrie Meres RN, BSN, [place]    . Uncontrolled diabetes mellitus type 2 with peripheral artery disease ([place]) 03/16/2015  . Acute osteomyelitis of left foot ([place]) 03/16/2015  . Cellulitis and abscess of leg, except foot 02/21/2015  . Colon cancer screening 01/30/2015  . Diabetic ulcer of left foot with necrosis of muscle ([place]) 02/21/2015  . Dyslipidemia associated with type 2 diabetes mellitus ([place]) 03/16/2015  . Encounter for long-term (current) use of other medications 10/16/2011  . Left carotid bruit 12/11/2014  . Osteoarthritis of right hip 04/09/2012  . Septic arthritis of left foot ([place]) 03/16/2015  . CAD, NATIVE VESSEL  04/16/2008    Qualifier: Diagnosis of  By: Aundra Dubin, MD, [place]    . Myocardial infarction ([place])     2009  . Cancer ([place])     non-hodgkins lymphoma  2000  recd radiation    Past Surgical History  Procedure Laterality Date  . Cardiac catheterization      2011  . Leg surgery      on bone above ankle-not sure which bone left leg 1981  . Multiple extractions with alveoloplasty  10/31/2011    Procedure: MULTIPLE EXTRACION WITH ALVEOLOPLASTY;  Surgeon: Lenn Cal, DDS;  Location: [facility];  Service: Oral Surgery;  Laterality: N/A;  Extraction of tooth #'s 2, 4,5,6,7,8,9,10,11,12,13,15,21,22,23,24,25,26,27,28, 29, and 31 with alveoloplasty  . Leg surgery  1981    Bone cyst- Left leg  . Peripheral vascular catheterization N/A 04/05/2015    Procedure: Abdominal Aortogram w/Lower Extremity;  Surgeon: Conrad Gardiner, MD;  Location: [facility];  Service: Cardiovascular;  Laterality: N/A;  . Tonsillectomy    . Femoral-tibial bypass graft Left 05/04/2015    Procedure: LEFT  FEMORAL-BELOW THE KNEE POPLITEAL  ARTERY BYPASS GRAFT USING NON REVERSE LEFT GREATER SAPHENOUS VEIN;  Surgeon: Serafina Mitchell, MD;  Location: [facility];  Service: Vascular;  Laterality: Left;  . Intraoperative arteriogram Left 05/04/2015    Procedure: INTRA OPERATIVE ARTERIOGRAM TIMES TWO;  Surgeon: Serafina Mitchell, MD;  Location: [facility];  Service: Vascular;  Laterality: Left;  . Pr vein bypass graft,aorto-fem-pop      Allergies  Allergen Reactions  . Actos [Pioglitazone]     Rash and edema   . Fenofibrate Other (See Comments)    Joint pain/cramping/burning  . Chlorhexidine Gluconate Rash  . Orange Oil Rash  . Soap Other (See Comments)    States that most soaps make him break out/rash-he uses a sensitive skin soap and shampoos as well, has to use specific allergen free soap and shampoo    Current Outpatient Prescriptions  Medication Sig Dispense Refill  . aspirin 81 MG tablet Take 81 mg by mouth daily.    .  cilostazol (PLETAL) 100 MG tablet Take 1 tablet (100 mg total) by mouth 2 (two) times daily. 180 tablet 3  . clopidogrel (PLAVIX) 75 MG tablet Take 1 tablet (75 mg total) by mouth daily. 90 tablet 2  . glimepiride (AMARYL) 1 MG tablet TAKE 1 TABLET ONE TIME DAILY WITH BREAKFAST 90 tablet 1  . glucose blood (COOL BLOOD GLUCOSE TEST STRIPS) test strip Use as instructed to test blood sugar up to twice a day. DX: E11.09 200 each 3  . glucose blood (COOL BLOOD GLUCOSE TEST STRIPS) test strip Use to check blood sugar twice a day  DX. E11.51 100 each 12  . Insulin Pen Needle (NOVOFINE) 32G X 6 MM MISC 1 Act by Does not apply route daily. Use 1 QD 100 each 3  . isosorbide mononitrate (IMDUR) 30 MG 24 hr tablet Take 1 tablet (30 mg total) by mouth 2 (two) times daily. 180 tablet 1  . lisinopril (PRINIVIL,ZESTRIL) 40 MG tablet Take 1 tablet (40 mg total) by mouth daily. 90 tablet 2  . metFORMIN (GLUCOPHAGE) 500 MG tablet Take 2 tablets (1,000 mg total) by mouth 2 (two) times daily with a meal. 360 tablet 1  . metoprolol (LOPRESSOR) 50 MG tablet Take 50 mg by mouth 2 (two) times daily.    . naproxen (NAPROSYN) 375 MG tablet Take 375 mg by mouth 2 (two) times daily with a meal.    . pravastatin (PRAVACHOL) 80 MG tablet TAKE 1 TABLET EVERY DAY 90 tablet 2  . TOUJEO SOLOSTAR 300 UNIT/ML SOPN INJECT  50 UNITS SUBCUTANEOUSLY EVERY DAY 5 mL 11  . TRUEPLUS LANCETS 28G MISC Use to test blood sugar twice a day. DX: E11.51 200 each 1   No current facility-administered medications for this visit.    Family History  Problem Relation Age of Onset  . Heart attack Mother     29's  . Heart disease Mother     Heart disease before age 68  . Hypertension Mother   . Hyperlipidemia Mother   . Peripheral Artery Disease Father   . Hypertension Father   . Hyperlipidemia Father   . Peripheral vascular disease Father   . Heart attack      40's/40's    Social History   Social History  . Marital Status: Married     Spouse Name: N/A  . Number of Children: N/A  . Years of Education: N/A   Occupational History  .      Disabled. Former Biomedical scientist.   Social History Main Topics  . Smoking status: Light Tobacco Smoker -- 0.50 packs/day for 43 years    Types: Cigarettes  . Smokeless tobacco: Never Used     Comment: heavy 2nd hand exposue ongoing  . Alcohol Use: No     Comment: former heavy user quit 2011  .  Drug Use: No  . Sexual Activity: Not on file   Other Topics Concern  . Not on file   Social History Narrative   The patient is married. The patient has no children.   Patient with a history of smoking one pack per day for 43 years. Patient started smoking at the age of 18.   Patient no longer drinks alcohol. Patient quit proximally one and a half years ago from a previously heavy alcohol use.     REVIEW OF SYSTEMS:   [X]  denotes positive finding, [ ]  denotes negative finding Cardiac  Comments:  Chest pain or chest pressure:    Shortness of breath upon exertion:    Short of breath when lying flat:    Irregular heart rhythm:        Vascular    Pain in calf, thigh, or hip brought on by ambulation: x Does not describe this as claudication but soreness in his leg from being up on it  Pain in feet at night that wakes you up from your sleep:     Blood clot in your veins:    Leg swelling:         Pulmonary    Oxygen at home:    Productive cough:     Wheezing:         Neurologic    Sudden weakness in arms or legs:     Sudden numbness in arms or legs:     Sudden onset of difficulty speaking or slurred speech:    Temporary loss of vision in one eye:     Problems with dizziness:         Gastrointestinal    Blood in stool:     Vomited blood:         Genitourinary    Burning when urinating:     Blood in urine:        Psychiatric    Major depression:         Hematologic    Bleeding problems:    Problems with blood clotting too easily:        Skin    Rashes or ulcers:          Constitutional    Fever or chills:      PHYSICAL EXAMINATION:  Filed Vitals:   09/17/15 1509  BP: 134/86  Pulse: 82  Temp: 98.6 F (37 C)  Resp: 16   Body mass index is 26.83 kg/(m^2).  General:  WDWN in NAD; vital signs documented above Gait: Not observed HENT: WNL, normocephalic Pulmonary: normal non-labored breathing , without Rales, rhonchi,  wheezing Cardiac: regular HR, without  Murmurs, rubs or gallops; without carotid bruits Abdomen: soft, NT, no masses Skin: without rashes Vascular Exam/Pulses:  Right Left  Radial 2+ (normal) 2+ (normal)  Femoral 2+ (normal) 2+ (normal)  DP Unable to palpate  Unable to palpate  PT Unable to palpate  Unable to palpate    Extremities: without ischemic changes, without Gangrene , without cellulitis; he has a wound a the lateral aspect of the left leg just above the ankle that is superficial and measures 2.5cm x 4.5cm. Musculoskeletal: no muscle wasting or atrophy  Neurologic: A&O X 3;  No focal weakness or paresthesias are detected Psychiatric:  The pt has Normal affect.   Non-Invasive Vascular Imaging:   ABI's 09/17/15: Right:  0.63 Left:  1.0  Arterial duplex left leg bypass 09/17/15: Patent left femoral to popliteal bypass graft without evidence of stenosis  Increased velocity gain noted at the left PT artery  ---no significant change from prior exam  Pt meds includes: Statin:  Yes.   Beta Blocker:  Yes.   Aspirin:  Yes.   ACEI:  Yes.   ARB:  No. Other Antiplatelet/Anticoagulant:  Yes.   Plavix   ASSESSMENT/PLAN:: 61 y.o. male who is s/p  left femoral to below knee popliteal artery bypass grafting with non reversed ipsilateral greater saphenous vein with intraoperative angiogram on 05/04/15.    -pt is doing quite well and his wounds have almost completely healed.  His ABI on the left today is 1.0 and he no longer has claudication sx.  He will discontinue his Pletal at this time.  If he starts to get claudication sx  in his right leg, he will restart the Pletal.   -he is counseled on smoking cessation and the importance of this.  -continue his statin, aspirin and Plavix -he does have 60-79% left carotid artery stenosis, which is followed by Dr Marigene Ehlers.  His last duplex was in October and it was recommended to f/u in 6 months for repeat scan.  He is due now for his duplex.   -he will f/u with Dr. Trula Slade in 6 months with ABI's and arterial duplex of left leg bypass-he will call sooner if needed.   Leontine Locket, PA-C [facility] [phone]  Clinic MD:  Pt seen and examined in conjunction with Dr. Trula Slade  I agree with the above.  He is status post left femoral to below-knee popliteal artery bypass graft with non-reversed ipsilateral saphenous vein on 05/04/2015.  This was done for large ulcers on his left leg.  His ulcers continue to improve.  His incisions have healed up nicely.  He had a duplex today which showed a widely patent bypass graft with normal ankle-brachial indices.  I will have the patient follow-up in 6 months with a repeat duplex.  The patient has a history of left carotid artery stenosis in the 60-79% category.  This has been followed by Dr.McClean.  He was scheduled for follow-up in April but this has not happened yet, possibly because of his ongoing recovery from his ulcers.  The patient is going to follow up to make sure that he gets done within the near future.  Annamarie Major

## 2015-10-10 ENCOUNTER — Other Ambulatory Visit: Payer: Self-pay | Admitting: Cardiology

## 2015-10-11 ENCOUNTER — Ambulatory Visit (INDEPENDENT_AMBULATORY_CARE_PROVIDER_SITE_OTHER): Payer: Commercial Managed Care - HMO | Admitting: Family

## 2015-10-11 ENCOUNTER — Telehealth: Payer: Self-pay

## 2015-10-11 ENCOUNTER — Encounter: Payer: Self-pay | Admitting: Family

## 2015-10-11 VITALS — BP 145/89 | HR 66 | Temp 98.4°F | Ht 70.0 in | Wt 191.0 lb

## 2015-10-11 DIAGNOSIS — I7025 Atherosclerosis of native arteries of other extremities with ulceration: Secondary | ICD-10-CM | POA: Diagnosis not present

## 2015-10-11 DIAGNOSIS — I739 Peripheral vascular disease, unspecified: Secondary | ICD-10-CM | POA: Diagnosis not present

## 2015-10-11 DIAGNOSIS — E1151 Type 2 diabetes mellitus with diabetic peripheral angiopathy without gangrene: Secondary | ICD-10-CM

## 2015-10-11 DIAGNOSIS — E1169 Type 2 diabetes mellitus with other specified complication: Secondary | ICD-10-CM | POA: Diagnosis not present

## 2015-10-11 DIAGNOSIS — Z95828 Presence of other vascular implants and grafts: Secondary | ICD-10-CM | POA: Diagnosis not present

## 2015-10-11 DIAGNOSIS — Z720 Tobacco use: Secondary | ICD-10-CM

## 2015-10-11 DIAGNOSIS — F17200 Nicotine dependence, unspecified, uncomplicated: Secondary | ICD-10-CM

## 2015-10-11 DIAGNOSIS — Z7984 Long term (current) use of oral hypoglycemic drugs: Secondary | ICD-10-CM | POA: Diagnosis not present

## 2015-10-11 MED ORDER — LEVOFLOXACIN 500 MG PO TABS: 500.0000 mg | ORAL_TABLET | Freq: Every day | ORAL | Status: DC

## 2015-10-11 NOTE — Progress Notes (Signed)
[facility] OF [place]   CC: Follow up peripheral artery occlusive disease  History of Present Illness [name] is a 60 y.o. male who is s/p left femoral to below knee popliteal artery bypass grafting with non reversed ipsilateral greater saphenous vein with intraoperative angiogram on 05/04/15.  He returns today after receiving a phone call this morning from NP at [facility]. Reported that pt. is at their office at that time, and reported that the left 5th toe has been draining since Monday. Reported there is no warmth or redness. Pt states he noted a small amount of pink tinged yellow drainage on the dressing that his wife changes every 2 days, from the callus at the base of his left 5th toe.  He denies any worse cough or dyspnea than ususal. He does report increased urinary frequency and urgency in the last 2 days, states he did not mention this to his PCP.  He was last seen by Dr. Trula Slade and S. Rhynes PA on 09/17/15. At that time pt was doing quite well and his wounds had almost completely healed. His ABI on the left was 1.0 and he no longer had claudication sx. He was to discontinue his Pletal at that time. If he starts to get claudication sx in his right leg, he will restart the Pletal.  -he was counseled on smoking cessation and the importance of this.  -continue his statin, aspirin and Plavix -he does have 60-79% left carotid artery stenosis, which is followed by Dr Marigene Ehlers. His last duplex was in October and it was recommended to f/u in 6 months for repeat scan. He is due now for his duplex.  -he was to f/u with Dr. Trula Slade in 6 months with ABI's and arterial duplex of left leg bypass-he will call sooner if needed   He reports mild claudication in both calves with walking, left more so than right, but he notes improvement in both legs the more he walks.  He does have a moderate left carotid artery stenosis that is followed by Dr.  Marigene Ehlers. His last carotid duplex was in October 2016. He has not had any stroke sx.   He is on Plavix and aspirin. He does take insulin, metformin, and amaryl for diabetes. He takes a beta blocker and ACEI for blood pressure management.   Pt Diabetic: Yes, December 2016 A1C was 7.0 (review of records), improved from 7.7 Pt smoker: smoker (1 ppd)  Pt meds include: Statin :Yes Betablocker: Yes ASA: Yes Other anticoagulants/antiplatelets: Plavix, Pletal helps the night time cramping in the right leg    Past Medical History  Diagnosis Date  . Essential hypertension 04/16/2008    Qualifier: Diagnosis of  By: Aundra Dubin, MD, [place]    . Hyperlipidemia with target LDL less than 100 04/16/2008        . Hypertriglyceridemia 03/26/2014  . PAD (peripheral artery disease) ([place]) 03/26/2014  . Peripheral vascular disease, unspecified ([place]) 11/17/2011  . Type 2 diabetes mellitus with vascular disease ([place]) 06/22/2009    Qualifier: Diagnosis of  By: Karrie Meres RN, BSN, [place]    . Uncontrolled diabetes mellitus type 2 with peripheral artery disease ([place]) 03/16/2015  . Acute osteomyelitis of left foot ([place]) 03/16/2015  . Cellulitis and abscess of leg, except foot 02/21/2015  . Colon cancer screening 01/30/2015  . Diabetic ulcer of left foot with necrosis of muscle ([place]) 02/21/2015  . Dyslipidemia associated with type 2 diabetes mellitus ([place]) 03/16/2015  . Encounter for long-term (  current) use of other medications 10/16/2011  . Left carotid bruit 12/11/2014  . Osteoarthritis of right hip 04/09/2012  . Septic arthritis of left foot ([place]) 03/16/2015  . CAD, NATIVE VESSEL 04/16/2008    Qualifier: Diagnosis of  By: Aundra Dubin, MD, [place]    . Myocardial infarction ([place])     2009  . Cancer ([place])     non-hodgkins lymphoma  2000  recd radiation    Social History Social History  Substance Use Topics  . Smoking status: Light Tobacco Smoker -- 0.50 packs/day for 43 years    Types: Cigarettes  .  Smokeless tobacco: Never Used     Comment: heavy 2nd hand exposue ongoing  . Alcohol Use: No     Comment: former heavy user quit 2011    Family History Family History  Problem Relation Age of Onset  . Heart attack Mother     34's  . Heart disease Mother     Heart disease before age 38  . Hypertension Mother   . Hyperlipidemia Mother   . Peripheral Artery Disease Father   . Hypertension Father   . Hyperlipidemia Father   . Peripheral vascular disease Father   . Heart attack      40's/40's    Past Surgical History  Procedure Laterality Date  . Cardiac catheterization      2011  . Leg surgery      on bone above ankle-not sure which bone left leg 1981  . Multiple extractions with alveoloplasty  10/31/2011    Procedure: MULTIPLE EXTRACION WITH ALVEOLOPLASTY;  Surgeon: Lenn Cal, DDS;  Location: [place];  Service: Oral Surgery;  Laterality: N/A;  Extraction of tooth #'s 2, 4,5,6,7,8,9,10,11,12,13,15,21,22,23,24,25,26,27,28, 29, and 31 with alveoloplasty  . Leg surgery  1981    Bone cyst- Left leg  . Peripheral vascular catheterization N/A 04/05/2015    Procedure: Abdominal Aortogram w/Lower Extremity;  Surgeon: Conrad [place]sburg, MD;  Location: [place] [facility];  Service: Cardiovascular;  Laterality: N/A;  . Tonsillectomy    . Femoral-tibial bypass graft Left 05/04/2015    Procedure: LEFT  FEMORAL-BELOW THE KNEE POPLITEAL  ARTERY BYPASS GRAFT USING NON REVERSE LEFT GREATER SAPHENOUS VEIN;  Surgeon: Serafina Mitchell, MD;  Location: [place];  Service: Vascular;  Laterality: Left;  . Intraoperative arteriogram Left 05/04/2015    Procedure: INTRA OPERATIVE ARTERIOGRAM TIMES TWO;  Surgeon: Serafina Mitchell, MD;  Location: [place];  Service: Vascular;  Laterality: Left;  . Pr vein bypass graft,aorto-fem-pop      Allergies  Allergen Reactions  . Actos [Pioglitazone]     Rash and edema   . Fenofibrate Other (See Comments)    Joint pain/cramping/burning  . Chlorhexidine Gluconate Rash   . Orange Oil Rash  . Soap Other (See Comments)    States that most soaps make him break out/rash-he uses a sensitive skin soap and shampoos as well, has to use specific allergen free soap and shampoo    Current Outpatient Prescriptions  Medication Sig Dispense Refill  . aspirin 81 MG tablet Take 81 mg by mouth daily.    . cilostazol (PLETAL) 100 MG tablet Take 1 tablet (100 mg total) by mouth 2 (two) times daily. 180 tablet 3  . clopidogrel (PLAVIX) 75 MG tablet Take 1 tablet (75 mg total) by mouth daily. 90 tablet 2  . glimepiride (AMARYL) 1 MG tablet TAKE 1 TABLET ONE TIME DAILY WITH BREAKFAST 90 tablet 1  . glucose blood (COOL BLOOD GLUCOSE TEST  STRIPS) test strip Use as instructed to test blood sugar up to twice a day. DX: E11.09 200 each 3  . glucose blood (COOL BLOOD GLUCOSE TEST STRIPS) test strip Use to check blood sugar twice a day  DX. E11.51 100 each 12  . Insulin Pen Needle (NOVOFINE) 32G X 6 MM MISC 1 Act by Does not apply route daily. Use 1 QD 100 each 3  . isosorbide mononitrate (IMDUR) 30 MG 24 hr tablet Take 1 tablet (30 mg total) by mouth 2 (two) times daily. 180 tablet 1  . lisinopril (PRINIVIL,ZESTRIL) 40 MG tablet Take 1 tablet (40 mg total) by mouth daily. 90 tablet 2  . metFORMIN (GLUCOPHAGE) 500 MG tablet Take 2 tablets (1,000 mg total) by mouth 2 (two) times daily with a meal. 360 tablet 1  . metoprolol (LOPRESSOR) 50 MG tablet Take 50 mg by mouth 2 (two) times daily.    . naproxen (NAPROSYN) 375 MG tablet Take 375 mg by mouth 2 (two) times daily with a meal.    . pravastatin (PRAVACHOL) 80 MG tablet TAKE 1 TABLET EVERY DAY 90 tablet 2  . TOUJEO SOLOSTAR 300 UNIT/ML SOPN INJECT  50 UNITS SUBCUTANEOUSLY EVERY DAY 5 mL 11  . TRUEPLUS LANCETS 28G MISC Use to test blood sugar twice a day. DX: E11.51 200 each 1   No current facility-administered medications for this visit.    ROS: See HPI for pertinent positives and negatives.   Physical Examination  Filed Vitals:    10/11/15 0952 10/11/15 0954  BP: 154/94 145/89  Pulse: 66   Temp: 98.4 F (36.9 C)   TempSrc: Oral   Height: 5\' 10"  (1.778 m)   Weight: 191 lb (86.637 kg)   SpO2: 96%    Body mass index is 27.41 kg/(m^2).  General: A&O x 3, WDWN. Gait: slight limp Eyes: PERRLA. Pulmonary: Respirations are non labored, CTAB, without wheezes , rales or rhonchi. Cardiac: regular rhythm, no detected murmur.     Carotid Bruits Right Left   Negative Negative  Aorta is not palpable. Radial pulses: 2+ palpable and =   VASCULAR EXAM: Extremities: left lateral calf with healing/granulating shallow ulcer. No other open wounds. No gangrene.  Area at base of left 5th toe that pt indicates is draining, has no drainage, no erythema, no swelling, but that area does have a callus.       LE Pulses Right Left   FEMORAL  palpable  palpable    POPLITEAL not palpable  not palpable   POSTERIOR TIBIAL not palpable  1+ palpable    DORSALIS PEDIS  ANTERIOR TIBIAL not palpable  2+ palpable    Abdomen: soft, NT, no palpable masses. Skin: see Extremities Musculoskeletal: no muscle wasting or atrophy. Neurologic: A&O X 3; Appropriate Affect ; SENSATION: normal; MOTOR FUNCTION: moving all extremities equally, motor strength 5/5 throughout. Speech is fluent/normal. CN 2-12 grossly intact.          Non-Invasive Vascular Imaging: DATE: 10/11/2015 None   ASSESSMENT: [name] is a 60 y.o. male  who is s/pleft femoral to below knee popliteal artery bypass grafting with non reversed ipsilateral greater saphenous vein with intraoperative angiogram on 05/04/15.   He is pleased with the return of arterial perfusion to his left leg, the pervasive ulcers that he had on his left lower leg  are almost all healed. His DM is in control, but unfortunately he continues to smoke.  Area at base of left 5th toe that pt indicates  is draining, has no drainage now, even when expressed, no erythema, no swelling, but there is a callus there.  I discussed with Dr. Oneida Alar pt HPI and exam results from today. Will treat with Levaquin for possible infection at base of left 5th toe, although there is little evidence of infection other than low grade fever that he reports at home, and Levaquin will also treat a possible UTI. He reports urinary frequency and urgency in the last 2 days.   PLAN:  Based on the patient's vascular studies and examination, pt will return to clinic in 2 weeks for evaluation of left foot, on a day that Dr. Trula Slade is in the office.  The patient was counseled re smoking cessation and given several free resources re smoking cessation.  I discussed in depth with the patient the nature of atherosclerosis, and emphasized the importance of maximal medical management including strict control of blood pressure, blood glucose, and lipid levels, obtaining regular exercise, and cessation of smoking.  The patient is aware that without maximal medical management the underlying atherosclerotic disease process will progress, limiting the benefit of any interventions.  The patient was given information about PAD including signs, symptoms, treatment, what symptoms should prompt the patient to seek immediate medical care, and risk reduction measures to take.  Clemon Chambers, RN, MSN, FNP-C [facility] [employer] Phone: [phone]  Clinic MD: Oneida Alar  10/11/2015 10:13 AM

## 2015-10-11 NOTE — Telephone Encounter (Signed)
Phone call from NP at [facility].  Reported th pt. is at their office now, and reported that the left 5th toe has been draining since Monday.  Reported there is no warmth or redness; described a thick, capsulated area on the left 5th toe.  Requested an appt. For eval.  Appt. offered at 10:45 AM with NP.  Agreed.

## 2015-10-11 NOTE — Progress Notes (Deleted)
[facility] OF [place]   CC: Follow up peripheral artery occlusive disease  History of Present Illness [name] is a 60 y.o. male who is s/p left femoral to below knee popliteal artery bypass grafting with non reversed ipsilateral greater saphenous vein with intraoperative angiogram on 05/04/15.  He returns today after receiving a phone call this morning from NP at [facility]. Reported that pt. is at their office at that time, and reported that the left 5th toe has been draining since Monday. Reported there is no warmth or redness; described a thick, capsulated area on the left 5th toe. Pt states he noted a small amount of pink tinged yellow drainage on the dressing that his wife changes every 2 day, for the callus at the base of his left 5th toe.   He was last seen by Dr. Trula Slade and S. Rhynes PA on 09/17/15. At that time pt was doing quite well and his wounds have*** almost completely healed. His ABI on the left today is 1.0 and he no longer has claudication sx. He will discontinue his Pletal at this time. If he starts to get claudication sx in his right leg, he will restart the Pletal.  -he is counseled on smoking cessation and the importance of this.  -continue his statin, aspirin and Plavix -he does have 60-79% left carotid artery stenosis, which is followed by Dr Marigene Ehlers. His last duplex was in October and it was recommended to f/u in 6 months for repeat scan. He is due now for his duplex.  -he will f/u with Dr. Trula Slade in 6 months with ABI's and arterial duplex of left leg bypass-he will call sooner if needed.***   He does continue to smoke. He no longer has any claudication symptoms.   He reports mild claudication in both calves with walking, left more so than right, but he notes improvement in both legs the more he walks.  He does have a moderate left carotid artery stenosis that is followed by Dr. Marigene Ehlers. His last carotid duplex was  in October 2016. He has not had any stroke sx.   He is on Plavix and aspirin. He does take insulin, metformin, and amaryl for diabetes. He takes a beta blocker and ACEI for blood pressure management.    Pt. {Actions; denies-reports:120008::"denies"} claudication in ***.  Pt. {Actions; denies-reports:120008::"denies"} rest pain; {Actions; denies-reports:120008::"denies"} night pain {Actions; denies-reports:120008::"denies"} non healing ulcers on {Right/left upper lower:12843} extremity.  Pt {HAS HAS [id number] had previous intervention of {Right, left-initial cap:5607} {POST VASCULAR SURGERY:3041213} .  The patient {Actions; denies-reports:120008::"denies"} New Medical or Surgical History: ***  Pt Diabetic: Yes, December 2016 A1C was 7.0, improved from 7.7 Pt smoker: smoker (1 ppd)  Pt meds include: Statin :Yes Betablocker: Yes ASA: Yes Other anticoagulants/antiplatelets: Plavix, Pletal helps the night time cramping in the right leg    Past Medical History  Diagnosis Date  . Essential hypertension 04/16/2008    Qualifier: Diagnosis of  By: Aundra Dubin, MD, [place]    . Hyperlipidemia with target LDL less than 100 04/16/2008        . Hypertriglyceridemia 03/26/2014  . PAD (peripheral artery disease) ([place]) 03/26/2014  . Peripheral vascular disease, unspecified ([place]) 11/17/2011  . Type 2 diabetes mellitus with vascular disease ([place]) 06/22/2009    Qualifier: Diagnosis of  By: Karrie Meres RN, BSN, [place]    . Uncontrolled diabetes mellitus type 2 with peripheral artery disease ([place]) 03/16/2015  . Acute osteomyelitis of left foot ([place]) 03/16/2015  .  Cellulitis and abscess of leg, except foot 02/21/2015  . Colon cancer screening 01/30/2015  . Diabetic ulcer of left foot with necrosis of muscle ([place]) 02/21/2015  . Dyslipidemia associated with type 2 diabetes mellitus ([place]) 03/16/2015  . Encounter for long-term (current) use of other medications 10/16/2011  . Left carotid bruit 12/11/2014  .  Osteoarthritis of right hip 04/09/2012  . Septic arthritis of left foot ([place]) 03/16/2015  . CAD, NATIVE VESSEL 04/16/2008    Qualifier: Diagnosis of  By: Aundra Dubin, MD, [place]    . Myocardial infarction ([place])     2009  . Cancer ([place])     non-hodgkins lymphoma  2000  recd radiation    Social History Social History  Substance Use Topics  . Smoking status: Light Tobacco Smoker -- 0.50 packs/day for 43 years    Types: Cigarettes  . Smokeless tobacco: Never Used     Comment: heavy 2nd hand exposue ongoing  . Alcohol Use: No     Comment: former heavy user quit 2011    Family History Family History  Problem Relation Age of Onset  . Heart attack Mother     52's  . Heart disease Mother     Heart disease before age 62  . Hypertension Mother   . Hyperlipidemia Mother   . Peripheral Artery Disease Father   . Hypertension Father   . Hyperlipidemia Father   . Peripheral vascular disease Father   . Heart attack      40's/40's    Past Surgical History  Procedure Laterality Date  . Cardiac catheterization      2011  . Leg surgery      on bone above ankle-not sure which bone left leg 1981  . Multiple extractions with alveoloplasty  10/31/2011    Procedure: MULTIPLE EXTRACION WITH ALVEOLOPLASTY;  Surgeon: Lenn Cal, DDS;  Location: [place];  Service: Oral Surgery;  Laterality: N/A;  Extraction of tooth #'s 2, 4,5,6,7,8,9,10,11,12,13,15,21,22,23,24,25,26,27,28, 29, and 31 with alveoloplasty  . Leg surgery  1981    Bone cyst- Left leg  . Peripheral vascular catheterization N/A 04/05/2015    Procedure: Abdominal Aortogram w/Lower Extremity;  Surgeon: Conrad Kennedy, MD;  Location: [place] [facility];  Service: Cardiovascular;  Laterality: N/A;  . Tonsillectomy    . Femoral-tibial bypass graft Left 05/04/2015    Procedure: LEFT  FEMORAL-BELOW THE KNEE POPLITEAL  ARTERY BYPASS GRAFT USING NON REVERSE LEFT GREATER SAPHENOUS VEIN;  Surgeon: Serafina Mitchell, MD;  Location: [place];  Service:  Vascular;  Laterality: Left;  . Intraoperative arteriogram Left 05/04/2015    Procedure: INTRA OPERATIVE ARTERIOGRAM TIMES TWO;  Surgeon: Serafina Mitchell, MD;  Location: [place];  Service: Vascular;  Laterality: Left;  . Pr vein bypass graft,aorto-fem-pop      Allergies  Allergen Reactions  . Actos [Pioglitazone]     Rash and edema   . Fenofibrate Other (See Comments)    Joint pain/cramping/burning  . Chlorhexidine Gluconate Rash  . Orange Oil Rash  . Soap Other (See Comments)    States that most soaps make him break out/rash-he uses a sensitive skin soap and shampoos as well, has to use specific allergen free soap and shampoo    Current Outpatient Prescriptions  Medication Sig Dispense Refill  . aspirin 81 MG tablet Take 81 mg by mouth daily.    . cilostazol (PLETAL) 100 MG tablet Take 1 tablet (100 mg total) by mouth 2 (two) times daily. 180 tablet 3  .  clopidogrel (PLAVIX) 75 MG tablet Take 1 tablet (75 mg total) by mouth daily. 90 tablet 2  . glimepiride (AMARYL) 1 MG tablet TAKE 1 TABLET ONE TIME DAILY WITH BREAKFAST 90 tablet 1  . glucose blood (COOL BLOOD GLUCOSE TEST STRIPS) test strip Use as instructed to test blood sugar up to twice a day. DX: E11.09 200 each 3  . glucose blood (COOL BLOOD GLUCOSE TEST STRIPS) test strip Use to check blood sugar twice a day  DX. E11.51 100 each 12  . Insulin Pen Needle (NOVOFINE) 32G X 6 MM MISC 1 Act by Does not apply route daily. Use 1 QD 100 each 3  . isosorbide mononitrate (IMDUR) 30 MG 24 hr tablet Take 1 tablet (30 mg total) by mouth 2 (two) times daily. 180 tablet 1  . lisinopril (PRINIVIL,ZESTRIL) 40 MG tablet Take 1 tablet (40 mg total) by mouth daily. 90 tablet 2  . metFORMIN (GLUCOPHAGE) 500 MG tablet Take 2 tablets (1,000 mg total) by mouth 2 (two) times daily with a meal. 360 tablet 1  . metoprolol (LOPRESSOR) 50 MG tablet Take 50 mg by mouth 2 (two) times daily.    . naproxen (NAPROSYN) 375 MG tablet Take 375 mg by mouth 2 (two)  times daily with a meal.    . pravastatin (PRAVACHOL) 80 MG tablet TAKE 1 TABLET EVERY DAY 90 tablet 2  . TOUJEO SOLOSTAR 300 UNIT/ML SOPN INJECT  50 UNITS SUBCUTANEOUSLY EVERY DAY 5 mL 11  . TRUEPLUS LANCETS 28G MISC Use to test blood sugar twice a day. DX: E11.51 200 each 1   No current facility-administered medications for this visit.    ROS: See HPI for pertinent positives and negatives.   Physical Examination  Filed Vitals:   10/11/15 0952 10/11/15 0954  BP: 154/94 145/89  Pulse: 66   Temp: 98.4 F (36.9 C)   TempSrc: Oral   Height: 5\' 10"  (1.778 m)   Weight: 191 lb (86.637 kg)   SpO2: 96%    Body mass index is 27.41 kg/(m^2).  General: A&O x 3, WDWN. Gait: not observed Eyes: PERRLA. Pulmonary: CTAB, without wheezes , rales or rhonchi. Cardiac: regular Rythm , without detected murmur.     Carotid Bruits Right Left   Negative Negative  Aorta is not palpable. Radial pulses: 2+   VASCULAR EXAM: Extremities with ischemic changes: several areas of ulcers around left ankle and foot, some with dark sloughing eschar, foul smelling, no purulent drainage*** One healing ulcer       LE Pulses Right Left   FEMORAL  palpable  palpable    POPLITEAL not palpable  not palpable   POSTERIOR TIBIAL not palpable  1+ palpable    DORSALIS PEDIS  ANTERIOR TIBIAL not palpable  2+ palpable    Abdomen: soft, NT, no palpable masses. Skin: see Extremities Musculoskeletal: no muscle wasting or atrophy. Neurologic: A&O X 3; Appropriate Affect ; SENSATION: normal; MOTOR FUNCTION: moving all extremities equally, motor strength 5/5 throughout. Speech is fluent/normal. CN 2-12 grossly intact.          Non-Invasive Vascular Imaging: DATE: 10/11/2015 ABI:  RIGHT: ***, Waveforms: ***;  LEFT: ***, Waveforms: *** DUPLEX SCAN OF BYPASS: ***  Previous angiogram: {yes no:315493::"Yes"} with findings of ***  Outside Studies/Documentation  ASSESSMENT: [name] is a 60 y.o. male  who is s/pleft femoral to below knee popliteal artery bypass grafting with non reversed ipsilateral greater saphenous vein with intraoperative angiogram on 05/04/15.     PLAN:  Based on the patient's vascular studies and examination, pt will return to clinic in {NUMBERS 1-5:20334} {days/wks/mos/yrs:310907}  I discussed in depth with the patient the nature of atherosclerosis, and emphasized the importance of maximal medical management including strict control of blood pressure, blood glucose, and lipid levels, obtaining regular exercise, and ***cessation of smoking.  The patient is aware that without maximal medical management the underlying atherosclerotic disease process will progress, limiting the benefit of any interventions.  The patient was given information about PAD including signs, symptoms, treatment, what symptoms should prompt the patient to seek immediate medical care, and risk reduction measures to take.  Clemon Chambers, RN, MSN, FNP-C [facility] Phone: [phone]  Clinic MD: Oneida Alar  10/11/2015 10:13 AM

## 2015-10-12 ENCOUNTER — Other Ambulatory Visit: Payer: Self-pay | Admitting: Family

## 2015-10-12 ENCOUNTER — Telehealth: Payer: Self-pay

## 2015-10-12 NOTE — Telephone Encounter (Signed)
Previously discussed pt's request to change his antibiotic to something other than Levaquin.  Reported that he had read the side effects, and stated that he wasn't going to risk taking it.  Based on recommendation of nurse practitioner, notified pt. That he should start Keflex 500 mg tid x 2 weeks with no refills.  Also advised that he will need to contact his PCP re: urinary tract symptoms, as Keflex will not treat UTI.  The pt. stated he has a 90 day supply of Keflex 500 mg on hand.  Advised pt. To only take the Keflex 500 mg tid for 2 weeks.  Pt. verb. understanding of both the dosing of Keflex and of the need to contact his PCP re: UTI symptoms.

## 2015-10-12 NOTE — Telephone Encounter (Signed)
-----   Message from Viann Fish, NP sent at 10/12/2015  1:57 PM EDT ----- Regarding: pt refusing Levaquin Arbie Cookey, You asked me about this pt and his refusal to take Levaquin that I prescribed for him yesterday when I saw him. Dr. Oneida Alar suggested Levaquin to address both a possible left foot ulcer infection, and will also address a UTI since he had a 2 day history of urinary frequency and urgency, along with his report of a low grade fever of 99 at home. He saw me yesterday at the request of his PCP for draining from a long term callus he has at the base of his left 5th toe. He had no drainage from this area yesterday, no swelling, no erythema, no open wound, no signs of infection. Pt was requesting amoxicillin, states it helped an infection he had at this area before. It is unlikely that he has an infection at this callus and has no other signs of infection in his feet/legs. Please advise pt that he needs to follow up with his PCP at [facility] re his UTI symptoms. Please give pt a prescription for Keflex 500 mg tid for 2 weeks, no refills, to address any possible infection in his left foot. Keflex will likely not treat a UTI, however. Thank you, Vinnie Level

## 2015-10-15 ENCOUNTER — Encounter: Payer: Self-pay | Admitting: Family

## 2015-10-16 ENCOUNTER — Telehealth: Payer: Self-pay

## 2015-10-16 NOTE — Telephone Encounter (Signed)
Prior auth for Clopidogrel 75mg submitted to [employer]. 

## 2015-10-18 ENCOUNTER — Telehealth: Payer: Self-pay

## 2015-10-18 NOTE — Telephone Encounter (Signed)
Clopidogrel 75 mg approved by [facility]. Good through 10/15/2017.

## 2015-10-22 ENCOUNTER — Ambulatory Visit: Payer: Self-pay | Admitting: Family

## 2015-10-23 ENCOUNTER — Other Ambulatory Visit: Payer: Self-pay | Admitting: Internal Medicine

## 2015-10-23 ENCOUNTER — Ambulatory Visit
Admission: RE | Admit: 2015-10-23 | Discharge: 2015-10-23 | Disposition: A | Discharge status: Home / Self Care | Payer: Commercial Managed Care - HMO | Source: Ambulatory Visit | Attending: Internal Medicine | Admitting: Internal Medicine

## 2015-10-23 DIAGNOSIS — L0390 Cellulitis, unspecified: Secondary | ICD-10-CM | POA: Diagnosis not present

## 2015-10-23 DIAGNOSIS — L0291 Cutaneous abscess, unspecified: Secondary | ICD-10-CM

## 2015-10-23 DIAGNOSIS — M7989 Other specified soft tissue disorders: Secondary | ICD-10-CM | POA: Diagnosis not present

## 2015-10-23 DIAGNOSIS — M79672 Pain in left foot: Secondary | ICD-10-CM | POA: Diagnosis not present

## 2015-11-02 DIAGNOSIS — M79672 Pain in left foot: Secondary | ICD-10-CM | POA: Diagnosis not present

## 2015-11-02 DIAGNOSIS — M869 Osteomyelitis, unspecified: Secondary | ICD-10-CM | POA: Diagnosis not present

## 2015-11-02 DIAGNOSIS — L0390 Cellulitis, unspecified: Secondary | ICD-10-CM | POA: Diagnosis not present

## 2015-11-04 ENCOUNTER — Other Ambulatory Visit: Payer: Self-pay | Admitting: Family

## 2015-12-04 ENCOUNTER — Other Ambulatory Visit: Payer: Self-pay | Admitting: *Deleted

## 2015-12-04 DIAGNOSIS — I739 Peripheral vascular disease, unspecified: Secondary | ICD-10-CM

## 2015-12-17 ENCOUNTER — Encounter: Payer: Self-pay | Admitting: Internal Medicine

## 2015-12-17 ENCOUNTER — Ambulatory Visit (INDEPENDENT_AMBULATORY_CARE_PROVIDER_SITE_OTHER): Payer: Commercial Managed Care - HMO | Admitting: Internal Medicine

## 2015-12-17 DIAGNOSIS — M009 Pyogenic arthritis, unspecified: Secondary | ICD-10-CM | POA: Diagnosis not present

## 2015-12-17 DIAGNOSIS — Z720 Tobacco use: Secondary | ICD-10-CM

## 2015-12-17 DIAGNOSIS — M86172 Other acute osteomyelitis, left ankle and foot: Secondary | ICD-10-CM | POA: Diagnosis not present

## 2015-12-17 DIAGNOSIS — F17210 Nicotine dependence, cigarettes, uncomplicated: Secondary | ICD-10-CM

## 2015-12-17 LAB — CBC
HCT: 43.7 % (ref 38.5–50.0)
Hemoglobin: 15.2 g/dL (ref 13.2–17.1)
MCH: 33.8 pg — ABNORMAL HIGH (ref 27.0–33.0)
MCHC: 34.8 g/dL (ref 32.0–36.0)
MCV: 97.1 fL (ref 80.0–100.0)
MPV: 8.4 fL (ref 7.5–12.5)
PLATELETS: 242 10*3/uL (ref 140–400)
RBC: 4.5 MIL/uL (ref 4.20–5.80)
RDW: 15.7 % — AB (ref 11.0–15.0)
WBC: 7.9 10*3/uL (ref 3.8–10.8)

## 2015-12-17 NOTE — Assessment & Plan Note (Signed)
He responded very well to empiric trimethoprim sulfamethoxazole and rifampin and currently has no clinical evidence of active infection. I will check his inflammatory markers and see him back in one month.

## 2015-12-17 NOTE — Progress Notes (Signed)
[facility]  Reason for Consult: Septic arthritis of left, fifth MTP joint Referring Physician: Dr. Mertha Finders  Patient Active Problem List   Diagnosis Date Noted  . Acute osteomyelitis of left foot ([place]) 03/16/2015    Priority: High  . Septic arthritis of left foot ([place]) 03/16/2015    Priority: High  . Cigarette smoker 12/17/2015  . S/P femoral-popliteal bypass surgery 07/16/2015  . Atherosclerosis of native arteries of the extremities with ulceration ([place]) 07/16/2015  . Aftercare following surgery of the circulatory system 07/16/2015  . Sleep apnea 04/27/2015  . Osteomyelitis ([place]) 03/30/2015  . Diabetic foot ([place]) 03/30/2015  . Dyslipidemia associated with type 2 diabetes mellitus ([place]) 03/16/2015  . Colon cancer screening 01/30/2015  . Left carotid bruit 12/11/2014  . PAD (peripheral artery disease) ([place]) 03/26/2014  . Hypertriglyceridemia 03/26/2014  . Osteoarthritis of right hip 04/09/2012  . Encounter for long-term (current) use of other medications 10/16/2011  . Type 2 diabetes mellitus with vascular disease ([place]) 06/22/2009  . Hyperlipidemia with target LDL less than 100 04/16/2008  . Essential hypertension 04/16/2008  . CAD, NATIVE VESSEL 04/16/2008    Patient's Medications  New Prescriptions   No medications on file  Previous Medications   ASPIRIN 81 MG TABLET    Take 81 mg by mouth daily.   CILOSTAZOL (PLETAL) 100 MG TABLET    Take 1 tablet (100 mg total) by mouth 2 (two) times daily.   CLOPIDOGREL (PLAVIX) 75 MG TABLET    Take 1 tablet (75 mg total) by mouth daily.   GLIMEPIRIDE (AMARYL) 1 MG TABLET    TAKE 1 TABLET ONE TIME DAILY WITH BREAKFAST   GLUCOSE BLOOD (COOL BLOOD GLUCOSE TEST STRIPS) TEST STRIP    Use as instructed to test blood sugar up to twice a day. DX: E11.09   GLUCOSE BLOOD (COOL BLOOD GLUCOSE TEST STRIPS) TEST STRIP    Use to check blood sugar twice a day  DX. E11.51   INSULIN PEN NEEDLE (NOVOFINE) 32G  X 6 MM MISC    1 Act by Does not apply route daily. Use 1 QD   ISOSORBIDE MONONITRATE (IMDUR) 30 MG 24 HR TABLET    Take 1 tablet (30 mg total) by mouth 2 (two) times daily.   LEVOFLOXACIN (LEVAQUIN) 500 MG TABLET    Take 1 tablet (500 mg total) by mouth daily.   LISINOPRIL (PRINIVIL,ZESTRIL) 40 MG TABLET    Take 1 tablet (40 mg total) by mouth daily.   METFORMIN (GLUCOPHAGE) 500 MG TABLET    Take 2 tablets (1,000 mg total) by mouth 2 (two) times daily with a meal.   METOPROLOL (LOPRESSOR) 50 MG TABLET    Take 50 mg by mouth 2 (two) times daily.   NAPROXEN (NAPROSYN) 375 MG TABLET    Take 375 mg by mouth 2 (two) times daily with a meal.   PRAVASTATIN (PRAVACHOL) 80 MG TABLET    TAKE 1 TABLET EVERY DAY   TOUJEO SOLOSTAR 300 UNIT/ML SOPN    INJECT  50 UNITS SUBCUTANEOUSLY EVERY DAY   TRUEPLUS LANCETS 28G MISC    Use to test blood sugar twice a day. DX: E11.51  Modified Medications   No medications on file  Discontinued Medications   No medications on file    Recommendations: 1. Observe off of antibiotics for now 2. Check sedimentation rate and C-reactive protein 3. Follow-up in one month   Assessment: He responded very well  to empiric trimethoprim sulfamethoxazole and rifampin and currently has no clinical evidence of active infection. I will check his inflammatory markers and see him back in one month.   HPI: [name] is a 60 y.o. male with diabetes mellitus, atherosclerosis and dyslipidemia who is had problems with a callus lateral to his left, fifth MTP joint for many years. He states that he had the callus shaved by a podiatrist several years ago and developed MRSA infection. He states that he developed recurrent infection about 8 months ago. No cultures were done at that time. He is also recently required left femoral-to-popliteal artery bypass. Postoperatively he developed more redness and swelling around his left fifth toe and then developed purulent drainage from the callus. He  was started on empiric trimethoprim sulfamethoxazole and rifampin in early July. He took the antibiotics for 4 weeks and noted dramatic improvement. He has not had any further drainage in about one month. He has not had any fever, chills or sweats.  Review of Systems: Review of Systems  Constitutional: Negative for chills, diaphoresis, fever, malaise/fatigue and weight loss.  HENT: Negative for sore throat.   Respiratory: Negative for cough, sputum production and shortness of breath.   Cardiovascular: Negative for chest pain.  Gastrointestinal: Negative for abdominal pain, diarrhea, nausea and vomiting.  Musculoskeletal: Positive for joint pain. Negative for myalgias.  Skin: Negative for rash.  Neurological: Negative for headaches.      Past Medical History:  Diagnosis Date  . Acute osteomyelitis of left foot ([place]) 03/16/2015  . CAD, NATIVE VESSEL 04/16/2008   Qualifier: Diagnosis of  By: Aundra Dubin, MD, [place]    . Cancer ([place])    non-hodgkins lymphoma  2000  recd radiation  . Cellulitis and abscess of leg, except foot 02/21/2015  . Colon cancer screening 01/30/2015  . Diabetic ulcer of left foot with necrosis of muscle ([place]) 02/21/2015  . Dyslipidemia associated with type 2 diabetes mellitus ([place]) 03/16/2015  . Encounter for long-term (current) use of other medications 10/16/2011  . Essential hypertension 04/16/2008   Qualifier: Diagnosis of  By: Aundra Dubin, MD, [place]    . Hyperlipidemia with target LDL less than 100 04/16/2008       . Hypertriglyceridemia 03/26/2014  . Left carotid bruit 12/11/2014  . Myocardial infarction ([place])    2009  . Osteoarthritis of right hip 04/09/2012  . PAD (peripheral artery disease) ([place]) 03/26/2014  . Peripheral vascular disease, unspecified ([place]) 11/17/2011  . Septic arthritis of left foot ([place]) 03/16/2015  . Type 2 diabetes mellitus with vascular disease ([place]) 06/22/2009   Qualifier: Diagnosis of  By: Karrie Meres RN, BSN, [place]    . Uncontrolled diabetes  mellitus type 2 with peripheral artery disease ([place]) 03/16/2015    Social History  Substance Use Topics  . Smoking status: Light Tobacco Smoker    Packs/day: 0.50    Years: 43.00    Types: Cigarettes  . Smokeless tobacco: Never Used     Comment: heavy 2nd hand exposue ongoing  . Alcohol use No     Comment: former heavy user quit 2011    Family History  Problem Relation Age of Onset  . Heart attack Mother     65's  . Heart disease Mother     Heart disease before age 3  . Hypertension Mother   . Hyperlipidemia Mother   . Peripheral Artery Disease Father   . Hypertension Father   . Hyperlipidemia Father   . Peripheral vascular disease Father   .  Heart attack      40's/40's   Allergies  Allergen Reactions  . Actos [Pioglitazone]     Rash and edema   . Fenofibrate Other (See Comments)    Joint pain/cramping/burning  . Chlorhexidine Gluconate Rash  . Orange Oil Rash  . Soap Other (See Comments)    States that most soaps make him break out/rash-he uses a sensitive skin soap and shampoos as well, has to use specific allergen free soap and shampoo    OBJECTIVE: Vitals:   12/17/15 1336  BP: (!) 159/83  Pulse: 80  Temp: 97.6 F (36.4 C)  TempSrc: Oral  Weight: 188 lb (85.3 kg)   Body mass index is 26.98 kg/m.   Physical Exam  Constitutional: He is oriented to person, place, and time.  HENT:  Mouth/Throat: No oropharyngeal exudate.  Eyes: Conjunctivae are normal.  Cardiovascular: Normal rate and regular rhythm.   No murmur heard. Pulmonary/Chest: Effort normal and breath sounds normal.  Abdominal: Soft. He exhibits no mass. There is no tenderness.  Musculoskeletal: Normal range of motion.  He has healed incisions from his recent left leg bypass.  Neurological: He is alert and oriented to person, place, and time.  Skin: No rash noted.  He has very dry scaling skin from midshin to the foot of his left leg. He has a very thick callus lateral to his left, fifth  MTP joint. There is no drainage, odor, fluctuance or redness.  Psychiatric: Mood and affect normal.    Microbiology: No results found for this or any previous visit (from the past 240 hour(s)).  Michel Bickers, MD [facility] [place] [phone] pager   [phone] cell 12/17/2015, 1:58 PM

## 2015-12-18 LAB — BASIC METABOLIC PANEL
BUN: 10 mg/dL (ref 7–25)
CHLORIDE: 102 mmol/L (ref 98–110)
CO2: 22 mmol/L (ref 20–31)
Calcium: 9.3 mg/dL (ref 8.6–10.3)
Creat: 1.07 mg/dL (ref 0.70–1.25)
Glucose, Bld: 97 mg/dL (ref 65–99)
POTASSIUM: 4.8 mmol/L (ref 3.5–5.3)
SODIUM: 135 mmol/L (ref 135–146)

## 2015-12-18 LAB — C-REACTIVE PROTEIN

## 2015-12-18 LAB — SEDIMENTATION RATE: Sed Rate: 5 mm/hr (ref 0–20)

## 2016-01-16 ENCOUNTER — Encounter: Payer: Self-pay | Admitting: Cardiology

## 2016-01-17 ENCOUNTER — Ambulatory Visit: Payer: Self-pay | Admitting: Internal Medicine

## 2016-01-30 ENCOUNTER — Ambulatory Visit (INDEPENDENT_AMBULATORY_CARE_PROVIDER_SITE_OTHER): Payer: Commercial Managed Care - HMO | Admitting: Cardiology

## 2016-01-30 ENCOUNTER — Encounter: Payer: Self-pay | Admitting: Cardiology

## 2016-01-30 VITALS — BP 124/62 | HR 72 | Ht 70.0 in | Wt 192.0 lb

## 2016-01-30 DIAGNOSIS — I6529 Occlusion and stenosis of unspecified carotid artery: Secondary | ICD-10-CM

## 2016-01-30 DIAGNOSIS — I10 Essential (primary) hypertension: Secondary | ICD-10-CM

## 2016-01-30 DIAGNOSIS — I739 Peripheral vascular disease, unspecified: Secondary | ICD-10-CM

## 2016-01-30 DIAGNOSIS — R0989 Other specified symptoms and signs involving the circulatory and respiratory systems: Secondary | ICD-10-CM

## 2016-01-30 DIAGNOSIS — I2510 Atherosclerotic heart disease of native coronary artery without angina pectoris: Secondary | ICD-10-CM | POA: Diagnosis not present

## 2016-01-30 DIAGNOSIS — E785 Hyperlipidemia, unspecified: Secondary | ICD-10-CM

## 2016-01-30 LAB — LIPID PANEL
CHOL/HDL RATIO: 7 ratio — AB (ref <=5.0)
Cholesterol: 196 mg/dL (ref 125–200)
HDL: 28 mg/dL — AB (ref >=40)
TRIGLYCERIDES: 560 mg/dL — AB (ref <150)

## 2016-01-30 NOTE — Patient Instructions (Signed)
Medication Instructions:  Your physician recommends that you continue on your current medications as directed. Please refer to the Current Medication list given to you today.   Labwork: Lipid profile today  Testing/Procedures: Your physician has requested that you have a carotid duplex. This test is an ultrasound of the carotid arteries in your neck. It looks at blood flow through these arteries that supply the brain with blood. Allow one hour for this exam. There are no restrictions or special instructions.    Follow-Up: Your physician wants you to follow-up in: 6 months with Dr Burt Knack. (March 2018). You will receive a reminder letter in the mail two months in advance. If you don't receive a letter, please call our office to schedule the follow-up appointment.      If you need a refill on your cardiac medications before your next appointment, please call your pharmacy.

## 2016-01-31 NOTE — Progress Notes (Signed)
Patient ID: DEMARIS LAVERTY, male   DOB: 1955-10-30, 60 y.o.   MRN: [id number] PCP: Mertha Finders  60 yo with history of CAD, severe PAD, and diabetes presents for followup. He had NSTEMI in 6/09 that was treated medically as patient refused catheterization due to financial issues. He also has severe claudication. Patient had catheterization in 2/11 showing totally occluded circumflex artery with some collaterals as well as severe PAD (see PMH for description).  In 12/16, he had left fem-pop bypass.  He continues to smoke.  Still with ulcer on left toe.  He has been doing better since fem-pop.  Less claudication, walking farther. No exertional dyspnea or chest pain.  Main limitation now is low back pain.    Labs (3/11): HDL 20 => 36, LDL 72, TGs 1803 => 448  Labs (2/11): K 4.4, creatinine 0.8  Labs (6/13): TGs 539, HDL 33, LDL 84, K 4.7, creatinine 1.2 Labs (4/14): K 4.2, creatinine 1.0, TGs 553, LDL 79, HDL 31 Labs (6/14): TGs 759 (not on Lovaza), HDL 28, LDL 78 Labs (11/14): LDL 81, HDL 28, TGs 993 Labs (4/15): K 4.7, creatinine 1.0 Labs (10/15): K 4.9, creatinine 0.9, triglycerides 1347, LDL 63 Labs (9/16): TGs 1324, LDL 71 Labs (11/16): TSH normal, K 4.3, creatinine 0.97 Labs (8/17): K 4.8, creatinine 1.07  ECG: NSR, inferior Qs  Allergies (verified):  No Known Drug Allergies   Past Medical History:  1. Non-Hodgkin lymphoma. This was treated about 10 years ago with radiation, it is now in remission. He had it in his axillary area and inguinal areas.  2. Formerly morbidly obese, but lost 100 pounds by diet and exercise.  3. Hypertension.  4. Hypercholesterolemia with prominent hypertriglyceridemia.  Unable to tolerate fenofibrate due to joint pains. Myalgias with atorvastatin.  Unable to tolerate niacin.  5. History of alcohol abuse.  6. Prior smoker. ? COPD.  7. Coronary artery disease. The patient did have a non-ST-elevation MI in June 2009 that was treated medically. The patient  refused LHC at that time. LHC done 2/11 showed totally occluded CFX with faint R->L collaterals, 40-50% proximal LAD, patent RCA. Renal arteries were patent.  Lexiscan Sestamibi (4/14) with EF 61%, primarily fixed medium-sized basal to mid inferolateral perfusion defect suggestive of prior MI with minimal ischemia.  8. Echocardiogram done in June 2009 showed an EF of 60%, mild LVH, no regional wall motion abnormalities.  9. PAD. He underwent ABIs and lower extremity duplex scanning on February 18, 2008. This demonstrated occlusion of the distal right superficial femoral artery with reconstitution at the level of the popliteal artery. There appeared to be some degree of inflow disease because of biphasic common femoral waveforms. The ABI was 0.49 on the right and 0.87 on the left. Peripheral angiogram (2/11) showed 50% L CIA stenosis, 90% stenosis of L SFA and popliteal, totally occluded R SFA with reconstitution of popliteal.  ABIs (2/14) with 0.66 on right, 0.69 on left.  ABIs (7/14) 0.67 on right, 0.79 on left.  ABIs (11/16) 0.56 on right and 0.43 on left.  - left fem-pop bypass in 12/16.  10. Type II diabetes: Peripheral edema with Actos.  11. Carotid stenosis: Carotid dopplers ([id number]) with [id number] LICA stenosis.   Family History:  The patient's mother had a myocardial infarction in her 37s. He had 2 aunts with myocardial infarctions in their 72s. His father had peripheral arterial disease. No diabetes in his immediate family.   Social History:  The patient lives with his wife.  He is an unemployed Biomedical scientist, originally from [place]. He has no children. He quit smoking in 10/13. He used to drink heavily but has quit. He is on disability.   Review of Systems  All systems reviewed and negative except as per HPI.   Current Outpatient Prescriptions  Medication Sig Dispense Refill  . aspirin 81 MG tablet Take 81 mg by mouth daily.    . cilostazol (PLETAL) 100 MG tablet Take 1 tablet (100 mg total) by  mouth 2 (two) times daily. 180 tablet 3  . clopidogrel (PLAVIX) 75 MG tablet Take 1 tablet (75 mg total) by mouth daily. 90 tablet 1  . glimepiride (AMARYL) 1 MG tablet TAKE 1 TABLET ONE TIME DAILY WITH BREAKFAST 90 tablet 1  . glucose blood (COOL BLOOD GLUCOSE TEST STRIPS) test strip Use to check blood sugar twice a day  DX. E11.51 100 each 12  . Insulin Pen Needle (NOVOFINE) 32G X 6 MM MISC 1 Act by Does not apply route daily. Use 1 QD 100 each 3  . isosorbide mononitrate (IMDUR) 30 MG 24 hr tablet Take 1 tablet (30 mg total) by mouth 2 (two) times daily. 180 tablet 1  . lisinopril (PRINIVIL,ZESTRIL) 40 MG tablet Take 1 tablet (40 mg total) by mouth daily. 90 tablet 2  . metFORMIN (GLUCOPHAGE) 500 MG tablet Take 2 tablets (1,000 mg total) by mouth 2 (two) times daily with a meal. 360 tablet 1  . metoprolol (LOPRESSOR) 50 MG tablet Take 50 mg by mouth 2 (two) times daily.    . naproxen (NAPROSYN) 375 MG tablet Take 375 mg by mouth 2 (two) times daily with a meal.    . pravastatin (PRAVACHOL) 80 MG tablet TAKE 1 TABLET EVERY DAY 90 tablet 2  . TOUJEO SOLOSTAR 300 UNIT/ML SOPN INJECT  50 UNITS SUBCUTANEOUSLY EVERY DAY 5 mL 11  . TRUEPLUS LANCETS 28G MISC Use to test blood sugar twice a day. DX: E11.51 200 each 1   No current facility-administered medications for this visit.     BP 124/62   Pulse 72   Ht 5\' 10"  (1.778 m)   Wt 192 lb (87.1 kg)   BMI 27.55 kg/m  General: NAD Neck: No JVD, no thyromegaly or thyroid nodule.  Lungs: Distant breath sounds.  CV: Nondisplaced PMI.  Heart regular S1/S2, no S3/S4, 1/6 early SEM.  No peripheral edema.  Left carotid bruit.  Unable to palpate DP or PT pulses on either leg. Abdomen: Soft, nontender, no hepatosplenomegaly, no distention.   Neurologic: Alert and oriented x 3.  Psych: Normal affect. Extremities: No clubbing or cyanosis.   Assessment/Plan:  CAD, NATIVE VESSEL  Known CAD.  Continue ASA 81 and Plavix. He will continue ACEI, beta  blocker, and statin.  Lexiscan Sestamibi in 4/14 was suggestive of prior LCx-territory MI (totally occluded LCx on last cath).  He has not had significant angina recently.  PVD WITH CLAUDICATION  Known severe PAD (see PMH). He is s/p left fem-pop bypass with significant improvement in his claudication, but still has ulceration on a toe on the left.  Follows with [facility]. HYPERLIPIDEMIA Mr Penland has had very high triglycerides. He is on pravastatin.  He was unable to tolerate fenofibrate, niacin, or Lovaza.  Check lipids today. Hypertension BP controlled.  Carotid bruit I will arrange for repeat carotid dopplers.  Diabetes With his known heart disease, would be reasonable to consider transition off glimepiride and onto empagliflozin.   Given my transition to CHF clinic, I  will have him followup in 6 months with Dr Burt Knack (who has seen him for PV).    Loralie Champagne 01/31/2016

## 2016-02-01 ENCOUNTER — Other Ambulatory Visit: Payer: Self-pay | Admitting: *Deleted

## 2016-02-04 DIAGNOSIS — Z794 Long term (current) use of insulin: Secondary | ICD-10-CM | POA: Diagnosis not present

## 2016-02-04 DIAGNOSIS — E1169 Type 2 diabetes mellitus with other specified complication: Secondary | ICD-10-CM | POA: Diagnosis not present

## 2016-02-04 DIAGNOSIS — L03116 Cellulitis of left lower limb: Secondary | ICD-10-CM | POA: Diagnosis not present

## 2016-02-04 DIAGNOSIS — Z23 Encounter for immunization: Secondary | ICD-10-CM | POA: Diagnosis not present

## 2016-02-13 ENCOUNTER — Other Ambulatory Visit: Payer: Self-pay | Admitting: Cardiology

## 2016-02-22 DIAGNOSIS — H52223 Regular astigmatism, bilateral: Secondary | ICD-10-CM | POA: Diagnosis not present

## 2016-02-22 DIAGNOSIS — H524 Presbyopia: Secondary | ICD-10-CM | POA: Diagnosis not present

## 2016-02-22 DIAGNOSIS — E119 Type 2 diabetes mellitus without complications: Secondary | ICD-10-CM | POA: Diagnosis not present

## 2016-02-22 DIAGNOSIS — H5203 Hypermetropia, bilateral: Secondary | ICD-10-CM | POA: Diagnosis not present

## 2016-03-31 ENCOUNTER — Encounter (HOSPITAL_COMMUNITY): Payer: Self-pay

## 2016-03-31 ENCOUNTER — Ambulatory Visit: Payer: Self-pay | Admitting: Surgery

## 2016-05-06 ENCOUNTER — Other Ambulatory Visit: Payer: Self-pay | Admitting: Cardiology

## 2016-05-09 ENCOUNTER — Other Ambulatory Visit: Payer: Self-pay | Admitting: Internal Medicine

## 2016-05-09 ENCOUNTER — Other Ambulatory Visit: Payer: Self-pay | Admitting: Cardiology

## 2016-05-09 DIAGNOSIS — E1159 Type 2 diabetes mellitus with other circulatory complications: Secondary | ICD-10-CM

## 2016-06-18 DIAGNOSIS — E1169 Type 2 diabetes mellitus with other specified complication: Secondary | ICD-10-CM | POA: Diagnosis not present

## 2016-06-18 DIAGNOSIS — Z7984 Long term (current) use of oral hypoglycemic drugs: Secondary | ICD-10-CM | POA: Diagnosis not present

## 2016-06-18 DIAGNOSIS — L03116 Cellulitis of left lower limb: Secondary | ICD-10-CM | POA: Diagnosis not present

## 2016-07-17 ENCOUNTER — Other Ambulatory Visit: Payer: Self-pay | Admitting: Cardiology

## 2016-08-06 DIAGNOSIS — Z125 Encounter for screening for malignant neoplasm of prostate: Secondary | ICD-10-CM | POA: Diagnosis not present

## 2016-08-06 DIAGNOSIS — E785 Hyperlipidemia, unspecified: Secondary | ICD-10-CM | POA: Diagnosis not present

## 2016-08-06 DIAGNOSIS — I739 Peripheral vascular disease, unspecified: Secondary | ICD-10-CM | POA: Diagnosis not present

## 2016-08-06 DIAGNOSIS — E1169 Type 2 diabetes mellitus with other specified complication: Secondary | ICD-10-CM | POA: Diagnosis not present

## 2016-08-06 DIAGNOSIS — I2510 Atherosclerotic heart disease of native coronary artery without angina pectoris: Secondary | ICD-10-CM | POA: Diagnosis not present

## 2016-08-06 DIAGNOSIS — Z0000 Encounter for general adult medical examination without abnormal findings: Secondary | ICD-10-CM | POA: Diagnosis not present

## 2016-08-06 DIAGNOSIS — E1140 Type 2 diabetes mellitus with diabetic neuropathy, unspecified: Secondary | ICD-10-CM | POA: Diagnosis not present

## 2016-08-06 DIAGNOSIS — M869 Osteomyelitis, unspecified: Secondary | ICD-10-CM | POA: Diagnosis not present

## 2016-08-06 DIAGNOSIS — I252 Old myocardial infarction: Secondary | ICD-10-CM | POA: Diagnosis not present

## 2016-08-06 DIAGNOSIS — Z720 Tobacco use: Secondary | ICD-10-CM | POA: Diagnosis not present

## 2016-08-06 DIAGNOSIS — Z1389 Encounter for screening for other disorder: Secondary | ICD-10-CM | POA: Diagnosis not present

## 2016-08-06 DIAGNOSIS — I10 Essential (primary) hypertension: Secondary | ICD-10-CM | POA: Diagnosis not present

## 2016-08-07 ENCOUNTER — Encounter: Payer: Self-pay | Admitting: Cardiovascular Disease

## 2016-08-22 ENCOUNTER — Encounter: Payer: Self-pay | Admitting: Cardiovascular Disease

## 2016-08-22 ENCOUNTER — Ambulatory Visit (INDEPENDENT_AMBULATORY_CARE_PROVIDER_SITE_OTHER): Payer: Commercial Managed Care - HMO | Admitting: Cardiovascular Disease

## 2016-08-22 VITALS — BP 132/58 | HR 66 | Ht 70.0 in | Wt 193.6 lb

## 2016-08-22 DIAGNOSIS — R011 Cardiac murmur, unspecified: Secondary | ICD-10-CM | POA: Diagnosis not present

## 2016-08-22 DIAGNOSIS — I10 Essential (primary) hypertension: Secondary | ICD-10-CM

## 2016-08-22 DIAGNOSIS — I2510 Atherosclerotic heart disease of native coronary artery without angina pectoris: Secondary | ICD-10-CM

## 2016-08-22 NOTE — Patient Instructions (Signed)

## 2016-08-22 NOTE — Progress Notes (Signed)
Cardiology Office Note Date:  08/22/2016   ID:  [name], DOB 25-Mar-1956, MRN [id number]  PCP:  Henrine Screws, MD  Cardiologist:  Sherren Mocha, MD    Chief Complaint  Patient presents with  . Follow-up     History of Present Illness: [name] is a 61 y.o. male who presents for follow-up evaluation. He has previously been followed by Dr Aundra Dubin and I will be assuming his care moving forward.   The patient has coronary artery disease with history of a non-ST elevation MI in 2009 treated medically initially because of patient's refusal to proceed with cardiac catheterization. He ultimately underwent heart catheterization in 2011 demonstrating total occlusion left circumflex with collaterals. Medical therapy was recommended. He's been found to have significant lower extremity peripheral arterial disease and is followed by vascular surgery. He's had left femoropopliteal bypass in 2016 for critical limb ischemia.  The patient is here alone today. He's actually doing quite well. He denies chest pain, chest pressure, shortness of breath, edema, or heart palpitations. He reports recent hemoglobin A1c of 5.9.   Past Medical History:  Diagnosis Date  . Acute osteomyelitis of left foot ([place]) 03/16/2015  . CAD, NATIVE VESSEL 04/16/2008   Qualifier: Diagnosis of  By: Aundra Dubin, MD, [place]    . Cancer ([place])    non-hodgkins lymphoma  2000  recd radiation  . Cellulitis and abscess of leg, except foot 02/21/2015  . Colon cancer screening 01/30/2015  . Diabetic ulcer of left foot with necrosis of muscle ([place]) 02/21/2015  . Dyslipidemia associated with type 2 diabetes mellitus ([place]) 03/16/2015  . Encounter for long-term (current) use of other medications 10/16/2011  . Essential hypertension 04/16/2008   Qualifier: Diagnosis of  By: Aundra Dubin, MD, [place]    . Hyperlipidemia with target LDL less than 100 04/16/2008       . Hypertriglyceridemia 03/26/2014  . Left carotid bruit 12/11/2014    . Myocardial infarction ([place])    2009  . Osteoarthritis of right hip 04/09/2012  . PAD (peripheral artery disease) ([place]) 03/26/2014  . Peripheral vascular disease, unspecified ([place]) 11/17/2011  . Septic arthritis of left foot ([place]) 03/16/2015  . Type 2 diabetes mellitus with vascular disease ([place]) 06/22/2009   Qualifier: Diagnosis of  By: Karrie Meres RN, BSN, [place]    . Uncontrolled diabetes mellitus type 2 with peripheral artery disease ([place]) 03/16/2015    Past Surgical History:  Procedure Laterality Date  . CARDIAC CATHETERIZATION     2011  . FEMORAL-TIBIAL BYPASS GRAFT Left 05/04/2015   Procedure: LEFT  FEMORAL-BELOW THE KNEE POPLITEAL  ARTERY BYPASS GRAFT USING NON REVERSE LEFT GREATER SAPHENOUS VEIN;  Surgeon: Serafina Mitchell, MD;  Location: [facility];  Service: Vascular;  Laterality: Left;  . INTRAOPERATIVE ARTERIOGRAM Left 05/04/2015   Procedure: INTRA OPERATIVE ARTERIOGRAM TIMES TWO;  Surgeon: Serafina Mitchell, MD;  Location: [facility];  Service: Vascular;  Laterality: Left;  . LEG SURGERY     on bone above ankle-not sure which bone left leg 1981  . LEG SURGERY  1981   Bone cyst- Left leg  . MULTIPLE EXTRACTIONS WITH ALVEOLOPLASTY  10/31/2011   Procedure: MULTIPLE EXTRACION WITH ALVEOLOPLASTY;  Surgeon: Lenn Cal, DDS;  Location: [facility];  Service: Oral Surgery;  Laterality: N/A;  Extraction of tooth #'s 2, 4,5,6,7,8,9,10,11,12,13,15,21,22,23,24,25,26,27,28, 29, and 31 with alveoloplasty  . PERIPHERAL VASCULAR CATHETERIZATION N/A 04/05/2015   Procedure: Abdominal Aortogram w/Lower Extremity;  Surgeon: Conrad , MD;  Location: [facility];  Service: Cardiovascular;  Laterality: N/A;  . PR VEIN BYPASS GRAFT,AORTO-FEM-POP    . TONSILLECTOMY      Current Outpatient Prescriptions  Medication Sig Dispense Refill  . aspirin 81 MG tablet Take 81 mg by mouth daily.    . cilostazol (PLETAL) 100 MG tablet TAKE 1 TABLET (100 MG TOTAL) BY MOUTH 2 (TWO) TIMES DAILY. 180 tablet 1  .  clopidogrel (PLAVIX) 75 MG tablet Take 1 tablet (75 mg total) by mouth daily. 90 tablet 3  . glimepiride (AMARYL) 1 MG tablet Take 1 mg by mouth daily with breakfast.    . glucose blood (COOL BLOOD GLUCOSE TEST STRIPS) test strip Use to check blood sugar twice a day  DX. E11.51 100 each 12  . Insulin Glargine (TOUJEO SOLOSTAR) 300 UNIT/ML SOPN Inject 40 Units into the skin daily.    . Insulin Pen Needle (NOVOFINE) 32G X 6 MM MISC 1 Act by Does not apply route daily. Use 1 QD 100 each 3  . isosorbide mononitrate (IMDUR) 30 MG 24 hr tablet Take 30 mg by mouth 2 (two) times daily.    Marland Kitchen lisinopril (PRINIVIL,ZESTRIL) 40 MG tablet Take 40 mg by mouth daily.    . metFORMIN (GLUCOPHAGE) 1000 MG tablet Take 1,000 mg by mouth 2 (two) times daily with a meal.    . metoprolol (LOPRESSOR) 50 MG tablet Take 75 mg by mouth 2 (two) times daily.    . naproxen (NAPROSYN) 375 MG tablet Take 375 mg by mouth daily as needed (arthritis pain).     . pravastatin (PRAVACHOL) 80 MG tablet Take 80 mg by mouth daily.    . TRUEPLUS LANCETS 28G MISC Use to test blood sugar twice a day. DX: E11.51 200 each 1   No current facility-administered medications for this visit.     Allergies:   Actos [pioglitazone]; Fenofibrate; Chlorhexidine gluconate; Orange oil; and Soap   Social History:  The patient  reports that he has been smoking Cigarettes.  He has a 21.50 pack-year smoking history. He has never used smokeless tobacco. He reports that he does not drink alcohol or use drugs.   Family History:  The patient's  family history includes Heart attack in his mother; Heart disease in his mother; Hyperlipidemia in his father and mother; Hypertension in his father and mother; Peripheral Artery Disease in his father; Peripheral vascular disease in his father.    ROS:  Please see the history of present illness.  Otherwise, review of systems is positive for Leg pain, back pain.  All other systems are reviewed and negative.     PHYSICAL EXAM: VS:  BP (!) 132/58   Pulse 66   Ht 5\' 10"  (1.778 m)   Wt 193 lb 9.6 oz (87.8 kg)   BMI 27.78 kg/m  , BMI Body mass index is 27.78 kg/m. GEN: Well nourished, well developed, in no acute distress  HEENT: normal  Neck: no JVD, no masses. No carotid bruits Cardiac: RRR with 2/6 systolic murmur at the right upper sternal border               Respiratory:  clear to auscultation bilaterally, normal work of breathing GI: soft, nontender, nondistended, + BS MS: no deformity or atrophy  Ext: no pretibial edema, pedal pulses 2+= bilaterally Skin: warm and dry, no rash Neuro:  Strength and sensation are intact Psych: euthymic mood, full affect  EKG:  EKG is ordered today. The ekg ordered today shows normal sinus rhythm with frequent  PVCs, heart rate 69 bpm, nonspecific ST abnormality  Recent Labs: 12/17/2015: BUN 10; Creat 1.07; Hemoglobin 15.2; Platelets 242; Potassium 4.8; Sodium 135   Lipid Panel     Component Value Date/Time   CHOL 196 01/30/2016 1654   TRIG 560 (H) 01/30/2016 1654   HDL 28 (L) 01/30/2016 1654   CHOLHDL 7.0 (H) 01/30/2016 1654   VLDL NOT CALC 01/30/2016 1654   LDLCALC NOT CALC 01/30/2016 1654   LDLDIRECT 71.0 01/30/2015 1018      Wt Readings from Last 3 Encounters:  08/22/16 193 lb 9.6 oz (87.8 kg)  01/30/16 192 lb (87.1 kg)  12/17/15 188 lb (85.3 kg)    ASSESSMENT AND PLAN: 1.  Coronary artery disease, native vessel, without symptoms of angina. Patient's medical program is reviewed and will be continued. He's on a combination of aspirin and Plavix, isosorbide, metoprolol, and pravastatin. He's been unable to tolerate high intensity statin drugs.  2. Peripheral arterial disease with intermittent claudication. He's followed closely by vascular surgery.  3. Hyperlipidemia: Lipids are reviewed and his triglycerides are improved with better glycemic control. He's been unable to tolerate Fenofibrate, niacin, or Lovaza.  4. Type II DM: doing  well on his insulin regimen. Followed by PCP.   5. Heart murmur: probably aortic sclerosis/early aortic stenosis. Will check an echo to further evaluate.   Current medicines are reviewed with the patient today.  The patient does not have concerns regarding medicines.  Labs/ tests ordered today include:  No orders of the defined types were placed in this encounter.   Disposition:   FU   SignedSherren Mocha, MD  08/22/2016 11:52 AM    [facility] [place], [place], [address]  [address] Phone: [phone]; Fax: [phone]

## 2016-09-08 ENCOUNTER — Other Ambulatory Visit: Payer: Self-pay

## 2016-09-08 ENCOUNTER — Ambulatory Visit (HOSPITAL_COMMUNITY): Payer: Medicare HMO | Attending: Cardiology

## 2016-09-08 DIAGNOSIS — I10 Essential (primary) hypertension: Secondary | ICD-10-CM | POA: Insufficient documentation

## 2016-09-08 DIAGNOSIS — R011 Cardiac murmur, unspecified: Secondary | ICD-10-CM

## 2016-09-08 DIAGNOSIS — I517 Cardiomegaly: Secondary | ICD-10-CM | POA: Diagnosis not present

## 2016-09-08 DIAGNOSIS — I2510 Atherosclerotic heart disease of native coronary artery without angina pectoris: Secondary | ICD-10-CM | POA: Insufficient documentation

## 2016-09-22 ENCOUNTER — Other Ambulatory Visit: Payer: Self-pay | Admitting: Cardiology

## 2016-12-23 ENCOUNTER — Encounter: Payer: Self-pay | Admitting: Cardiology

## 2017-01-06 ENCOUNTER — Other Ambulatory Visit: Payer: Self-pay | Admitting: Cardiology

## 2017-02-04 ENCOUNTER — Other Ambulatory Visit: Payer: Self-pay | Admitting: Cardiology

## 2017-02-09 DIAGNOSIS — E1169 Type 2 diabetes mellitus with other specified complication: Secondary | ICD-10-CM | POA: Diagnosis not present

## 2017-02-09 DIAGNOSIS — Z23 Encounter for immunization: Secondary | ICD-10-CM | POA: Diagnosis not present

## 2017-02-09 DIAGNOSIS — E1140 Type 2 diabetes mellitus with diabetic neuropathy, unspecified: Secondary | ICD-10-CM | POA: Diagnosis not present

## 2017-02-09 DIAGNOSIS — E782 Mixed hyperlipidemia: Secondary | ICD-10-CM | POA: Diagnosis not present

## 2017-02-09 DIAGNOSIS — I2510 Atherosclerotic heart disease of native coronary artery without angina pectoris: Secondary | ICD-10-CM | POA: Diagnosis not present

## 2017-02-09 DIAGNOSIS — Z79899 Other long term (current) drug therapy: Secondary | ICD-10-CM | POA: Diagnosis not present

## 2017-02-09 DIAGNOSIS — E785 Hyperlipidemia, unspecified: Secondary | ICD-10-CM | POA: Diagnosis not present

## 2017-02-09 DIAGNOSIS — Z720 Tobacco use: Secondary | ICD-10-CM | POA: Diagnosis not present

## 2017-02-09 DIAGNOSIS — I252 Old myocardial infarction: Secondary | ICD-10-CM | POA: Diagnosis not present

## 2017-02-09 DIAGNOSIS — I739 Peripheral vascular disease, unspecified: Secondary | ICD-10-CM | POA: Diagnosis not present

## 2017-05-22 ENCOUNTER — Other Ambulatory Visit: Payer: Self-pay | Admitting: Cardiovascular Disease

## 2017-05-22 ENCOUNTER — Other Ambulatory Visit: Payer: Self-pay | Admitting: Cardiology

## 2017-08-13 DIAGNOSIS — E559 Vitamin D deficiency, unspecified: Secondary | ICD-10-CM | POA: Diagnosis not present

## 2017-08-13 DIAGNOSIS — Z1389 Encounter for screening for other disorder: Secondary | ICD-10-CM | POA: Diagnosis not present

## 2017-08-13 DIAGNOSIS — E785 Hyperlipidemia, unspecified: Secondary | ICD-10-CM | POA: Diagnosis not present

## 2017-08-13 DIAGNOSIS — I10 Essential (primary) hypertension: Secondary | ICD-10-CM | POA: Diagnosis not present

## 2017-08-13 DIAGNOSIS — E782 Mixed hyperlipidemia: Secondary | ICD-10-CM | POA: Diagnosis not present

## 2017-08-13 DIAGNOSIS — E781 Pure hyperglyceridemia: Secondary | ICD-10-CM | POA: Diagnosis not present

## 2017-08-13 DIAGNOSIS — Z0000 Encounter for general adult medical examination without abnormal findings: Secondary | ICD-10-CM | POA: Diagnosis not present

## 2017-08-13 DIAGNOSIS — E1140 Type 2 diabetes mellitus with diabetic neuropathy, unspecified: Secondary | ICD-10-CM | POA: Diagnosis not present

## 2017-08-13 DIAGNOSIS — I739 Peripheral vascular disease, unspecified: Secondary | ICD-10-CM | POA: Diagnosis not present

## 2017-08-13 DIAGNOSIS — H6123 Impacted cerumen, bilateral: Secondary | ICD-10-CM | POA: Diagnosis not present

## 2017-08-13 DIAGNOSIS — I2510 Atherosclerotic heart disease of native coronary artery without angina pectoris: Secondary | ICD-10-CM | POA: Diagnosis not present

## 2017-08-13 DIAGNOSIS — M869 Osteomyelitis, unspecified: Secondary | ICD-10-CM | POA: Diagnosis not present

## 2017-08-13 DIAGNOSIS — E1169 Type 2 diabetes mellitus with other specified complication: Secondary | ICD-10-CM | POA: Diagnosis not present

## 2017-08-15 IMAGING — CR DG FOOT COMPLETE 3+V*L*
3 series · 3 of 3 positions shown · non-contrast
Comparison: 03/15/2015.

CLINICAL DATA: Osteomyelitis.

EXAM:
LEFT FOOT - COMPLETE 3+ VIEW

[x foot ap left]
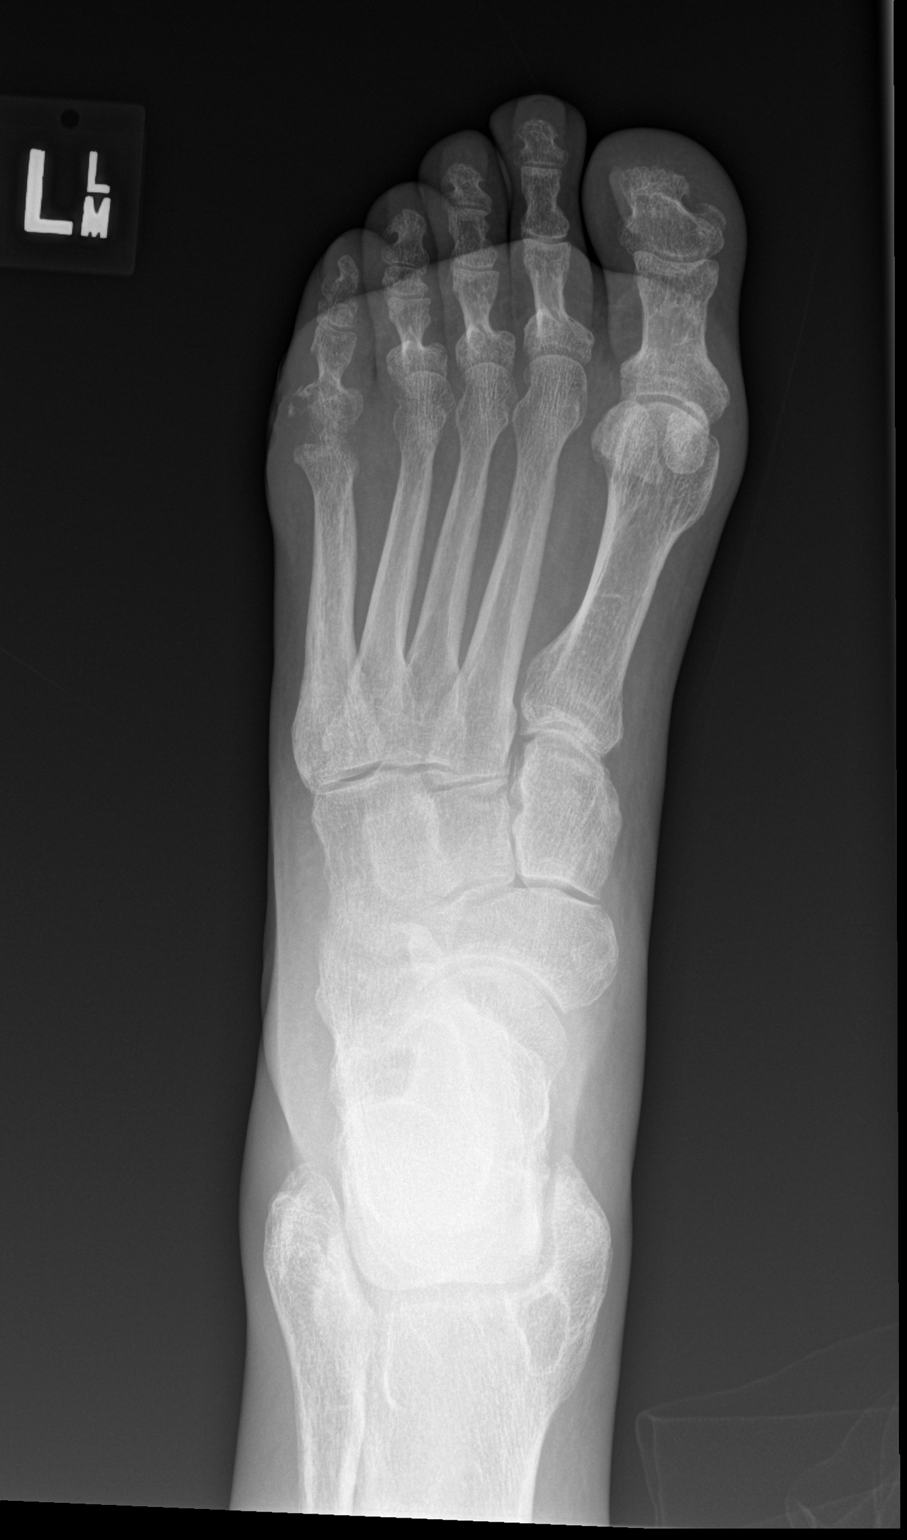

[x foot obl left]
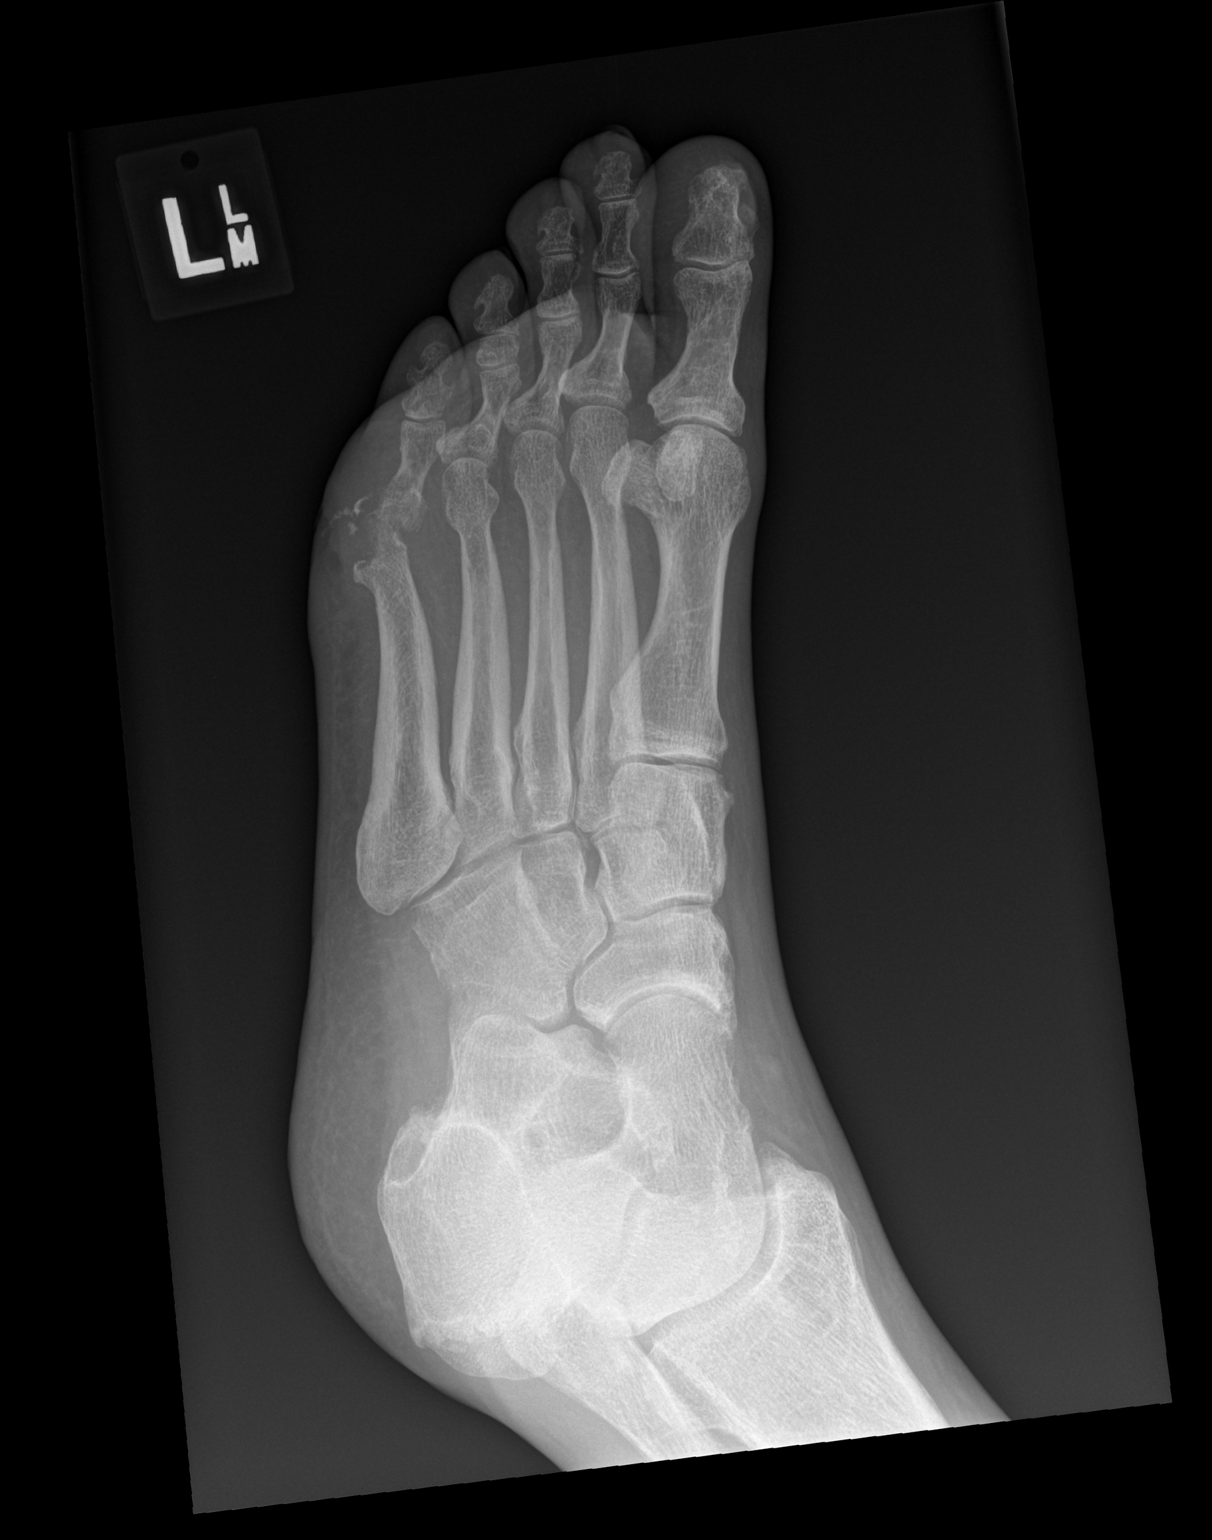

[x foot lat left]
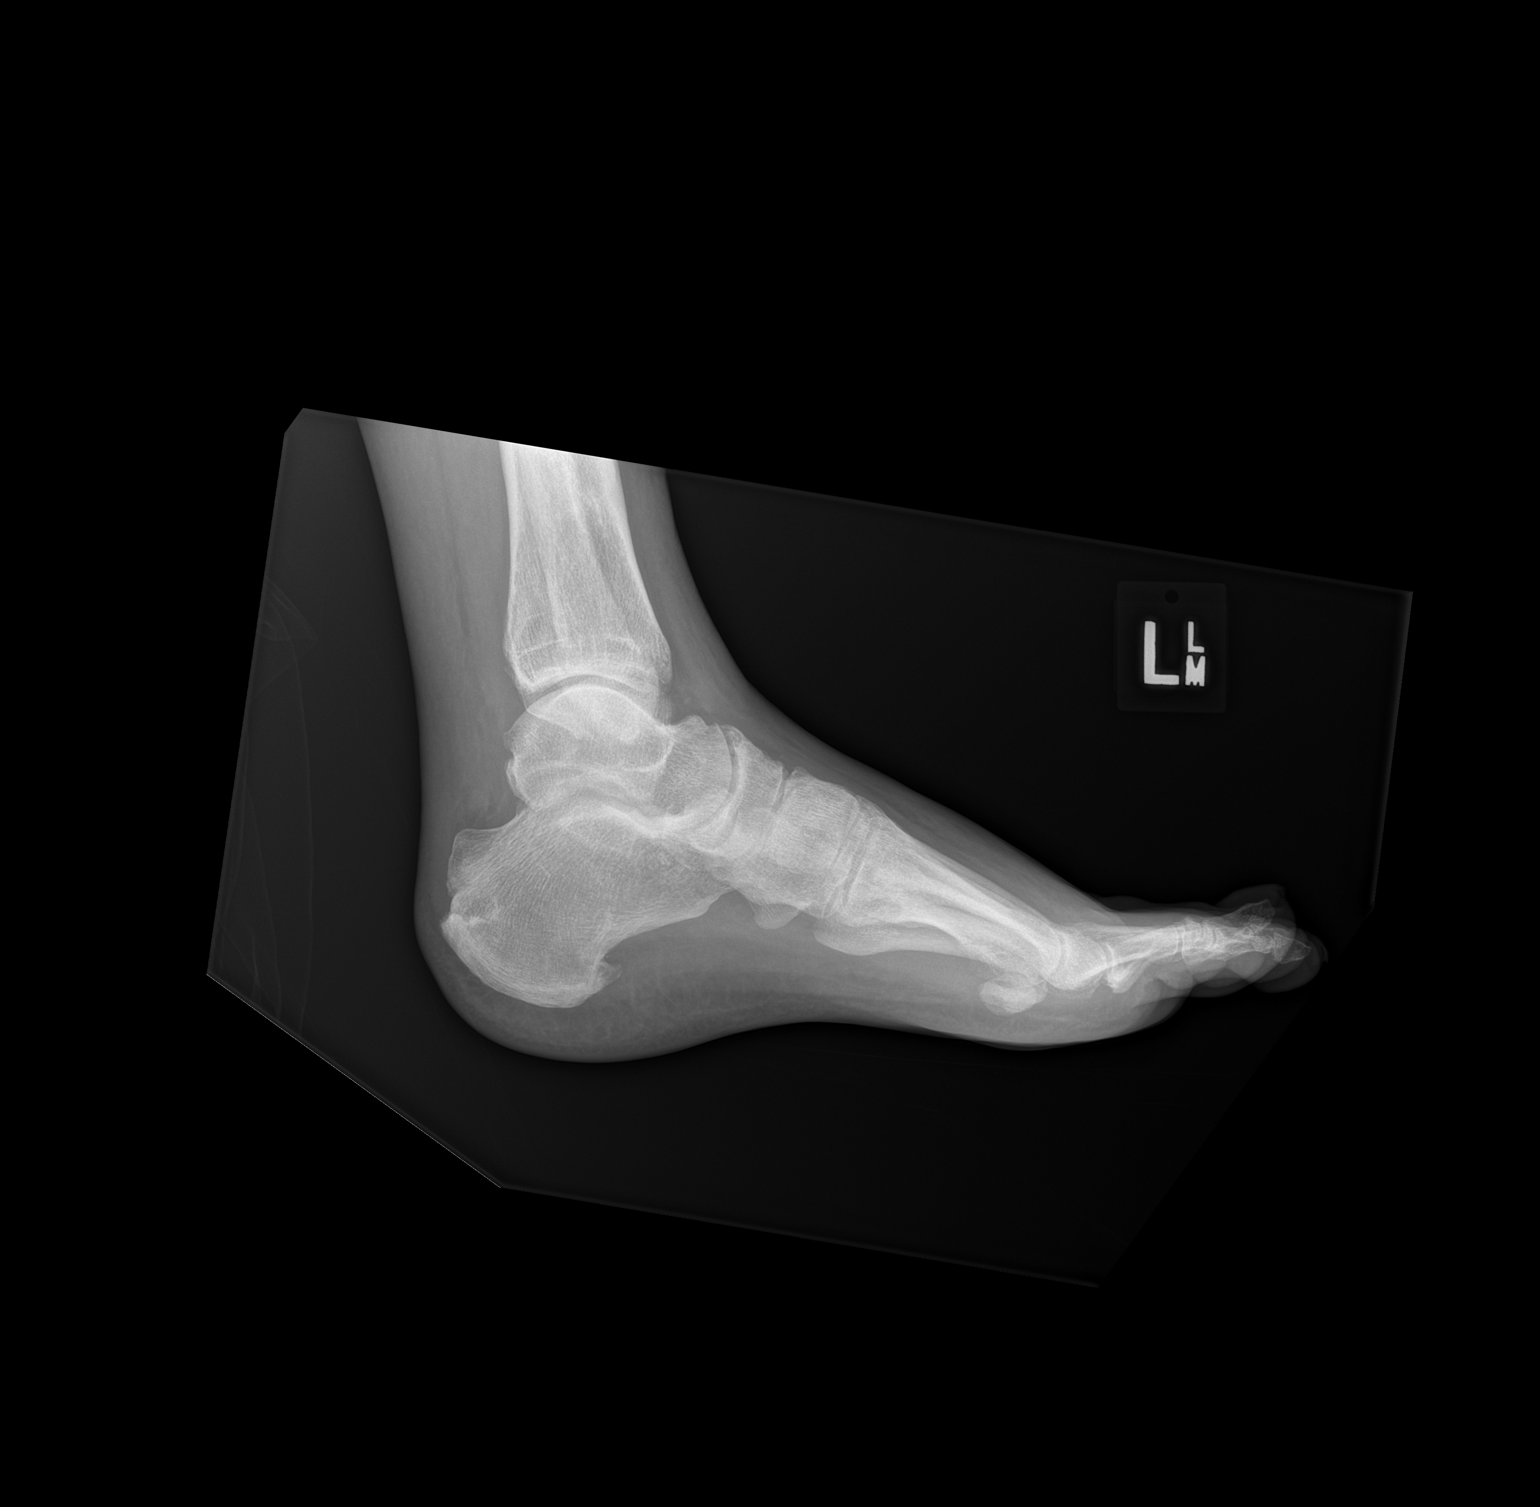

[3 of 3 positions shown; findings below may reference images not displayed]

FINDINGS: Progressive erosive changes of the distal left fifth metatarsal and
base of the proximal phalanx of left fifth digit. Findings
consistent with progressive osteomyelitis and septic arthritis about
the left fifth metatarsal phalangeal joint. No radiopaque foreign
bodies. No evidence fracture. Diffuse degenerative change.
IMPRESSION: Progressive erosive changes of the distal left fifth metatarsal and
base of the proximal phalanx of the left fifth digit. Findings
consist with progressive osteomyelitis and septic arthritis.

## 2017-09-18 ENCOUNTER — Other Ambulatory Visit: Payer: Self-pay | Admitting: Cardiology

## 2017-09-18 ENCOUNTER — Other Ambulatory Visit: Payer: Self-pay | Admitting: Cardiovascular Disease

## 2017-09-19 IMAGING — CR DG ANG/EXT/UNI/OR LEFT
2 series · 2 of 2 positions shown · non-contrast
Comparison: None.

CLINICAL DATA: Left femoral to popliteal bypass grafting for
nonhealing ulcer.

EXAM:
YUSNIEL CHIC/EXT/UNI/ OR

[AP (1 of 2)]
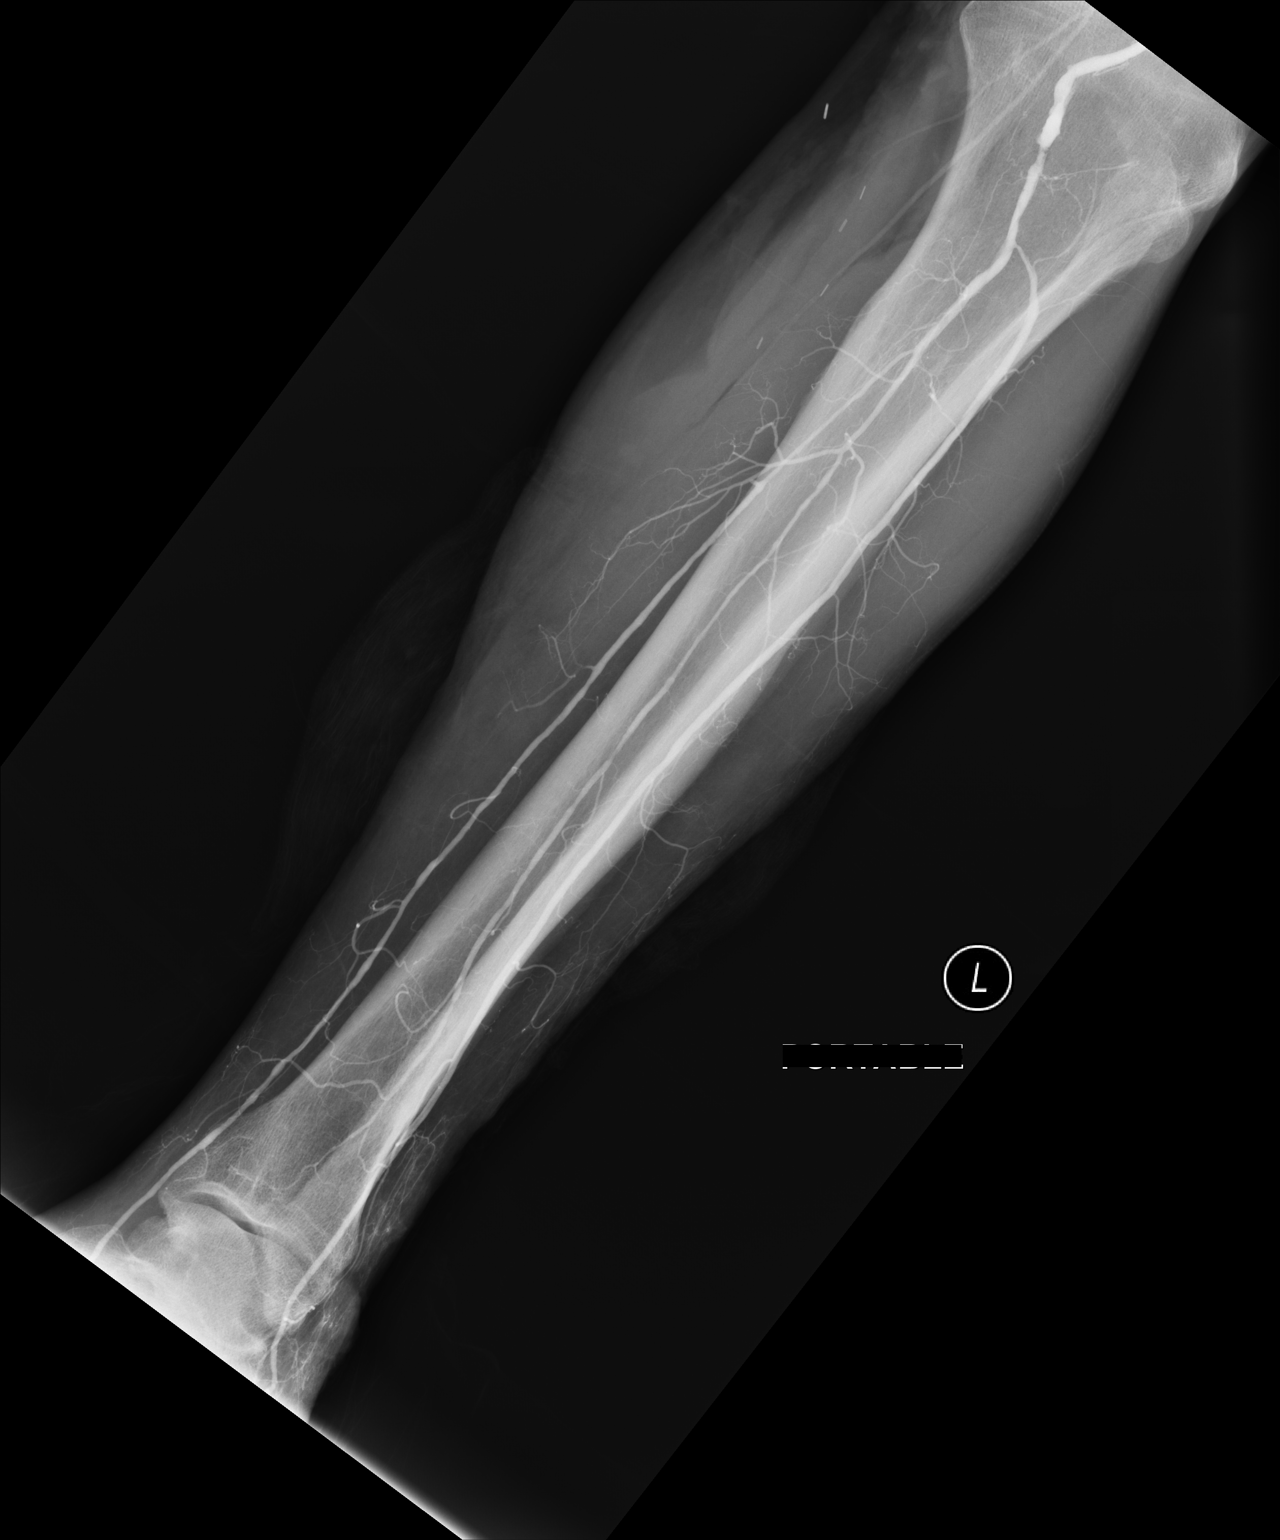

[AP (2 of 2)]
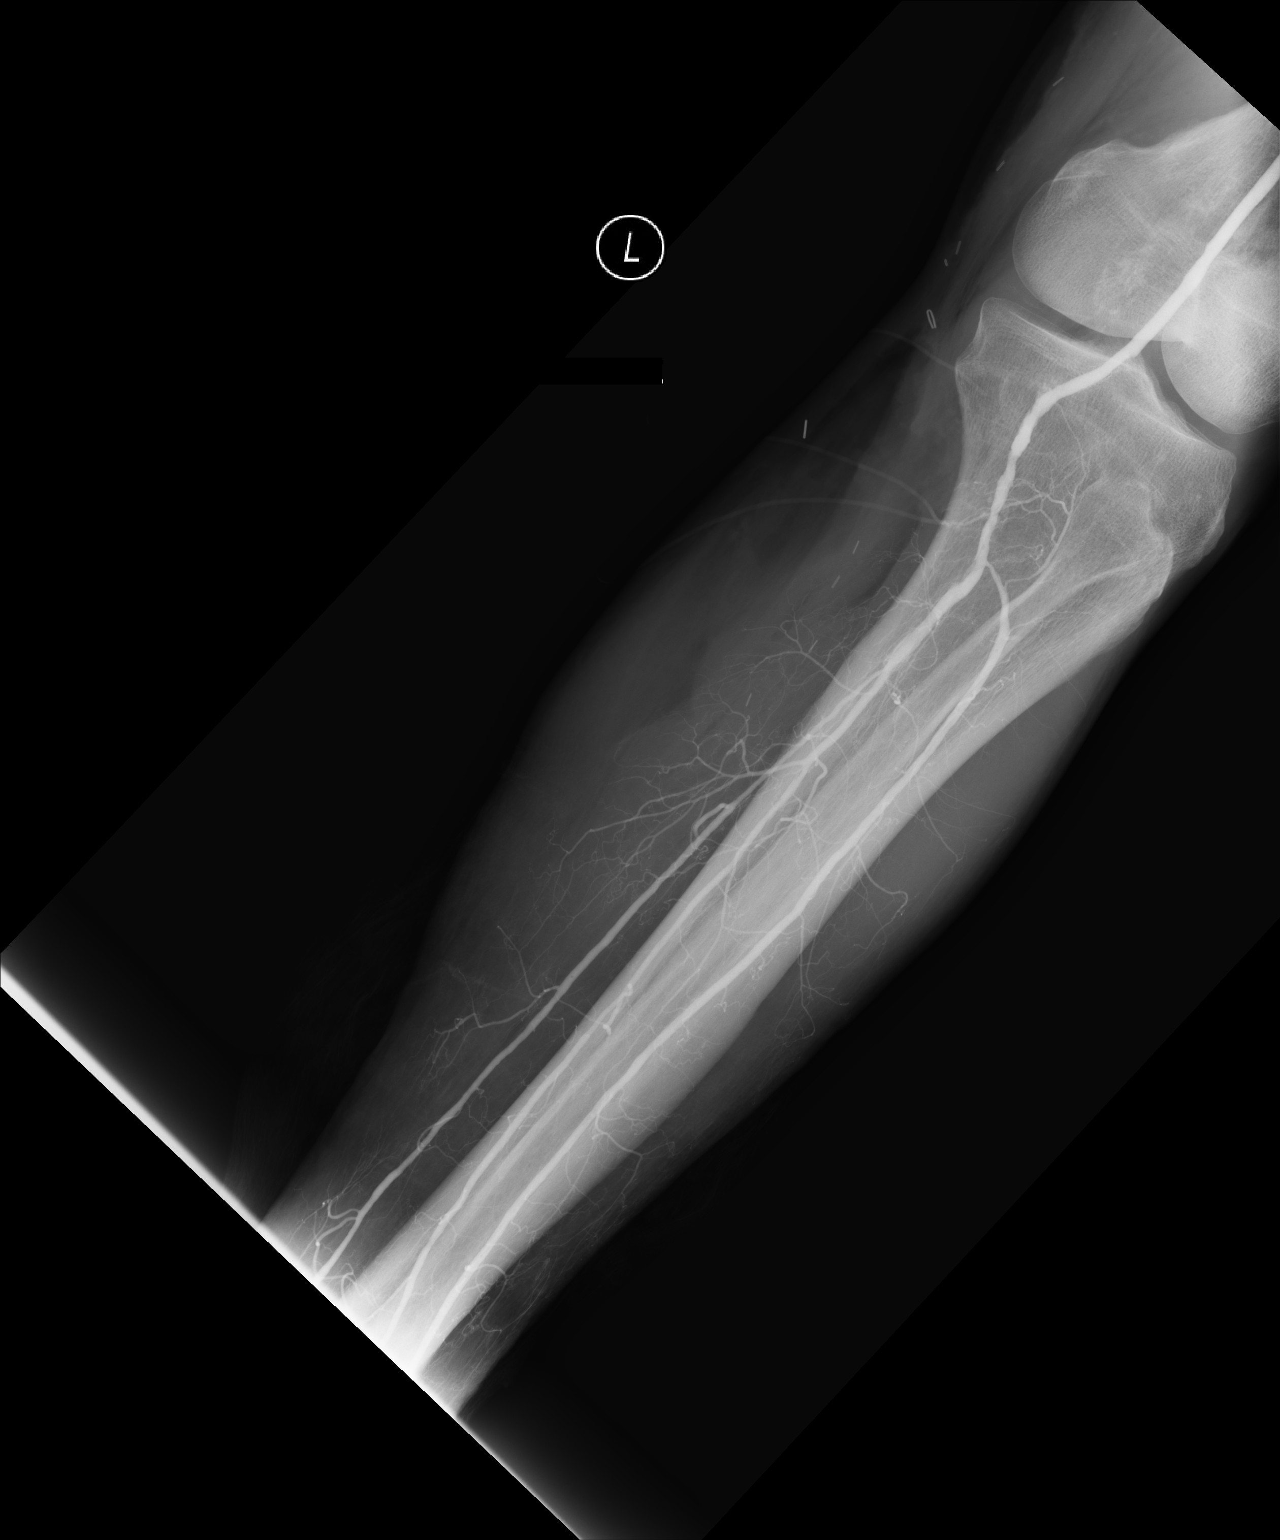

[2 of 2 positions shown; findings below may reference images not displayed]

FINDINGS: Initial intraoperative angiogram shows the distal end of a venous
bypass graft with anastomosis to the popliteal artery below the
knee. Focal narrowing at the distal anastomosis was felt to
represent a retained valve per operative node. A second
intraoperative angiogram after valve leaflet resection shows
significant improved patency with no further suggestion of stenosis
at the distal anastomosis. Three-vessel runoff is demonstrated to
the ankle.
IMPRESSION: Narrowing of femoral to popliteal bypass graft distal anastomosis
relieved after valve leaflet resection. Three-vessel runoff is
demonstrated in the lower leg.

## 2017-09-21 ENCOUNTER — Other Ambulatory Visit: Payer: Self-pay | Admitting: Internal Medicine

## 2017-09-21 ENCOUNTER — Ambulatory Visit
Admission: RE | Admit: 2017-09-21 | Discharge: 2017-09-21 | Disposition: A | Discharge status: Home / Self Care | Payer: Medicare HMO | Source: Ambulatory Visit | Attending: Internal Medicine | Admitting: Internal Medicine

## 2017-09-21 DIAGNOSIS — L84 Corns and callosities: Secondary | ICD-10-CM | POA: Diagnosis not present

## 2017-09-21 DIAGNOSIS — L03116 Cellulitis of left lower limb: Secondary | ICD-10-CM

## 2017-09-21 DIAGNOSIS — L0390 Cellulitis, unspecified: Secondary | ICD-10-CM | POA: Diagnosis not present

## 2017-09-21 DIAGNOSIS — M86172 Other acute osteomyelitis, left ankle and foot: Secondary | ICD-10-CM | POA: Diagnosis not present

## 2017-09-30 DIAGNOSIS — M869 Osteomyelitis, unspecified: Secondary | ICD-10-CM | POA: Diagnosis not present

## 2017-09-30 DIAGNOSIS — L03116 Cellulitis of left lower limb: Secondary | ICD-10-CM | POA: Diagnosis not present

## 2017-09-30 DIAGNOSIS — L84 Corns and callosities: Secondary | ICD-10-CM | POA: Diagnosis not present

## 2017-10-06 ENCOUNTER — Ambulatory Visit (INDEPENDENT_AMBULATORY_CARE_PROVIDER_SITE_OTHER): Payer: Medicare HMO | Admitting: Orthopedic Surgery

## 2017-10-06 ENCOUNTER — Encounter (INDEPENDENT_AMBULATORY_CARE_PROVIDER_SITE_OTHER): Payer: Self-pay | Admitting: Orthopedic Surgery

## 2017-10-06 DIAGNOSIS — E1142 Type 2 diabetes mellitus with diabetic polyneuropathy: Secondary | ICD-10-CM

## 2017-10-06 DIAGNOSIS — M86672 Other chronic osteomyelitis, left ankle and foot: Secondary | ICD-10-CM | POA: Diagnosis not present

## 2017-10-06 DIAGNOSIS — F17200 Nicotine dependence, unspecified, uncomplicated: Secondary | ICD-10-CM

## 2017-10-06 NOTE — Progress Notes (Signed)
Office Visit Note   Patient: [name]           Date of Birth: 1955-12-12           MRN: [id number] Visit Date: 10/06/2017              Requested by: Josetta Huddle, MD [address] [place], [place] [address] PCP: Josetta Huddle, MD  Chief Complaint  Patient presents with  . Right Foot - Open Wound, Pain      HPI: Patient is a 62 year old gentleman with a history of uncontrolled diabetes he states currently his hemoglobin A1c is running in the high fives and most recently at 7.0.  He states that he is still continuing to adjust his insulin regimen.  Patient complains of a chronic ulcer over the lateral border of the fifth metatarsal head left foot.  Patient is status post revascularization to his left lower extremity with a femoral below the knee popliteal bypass graft.  Assessment & Plan: Visit Diagnoses:  1. Diabetic polyneuropathy associated with type 2 diabetes mellitus ([place])   2. Chronic osteomyelitis of toe of left foot ([place])   3. Smokes     Plan: Discussed with his chronic osteomyelitis we should plan to proceed with left foot fifth ray amputation.  Discussed with his diabetes and smoking he is at increased risk of the wound not healing and needing a higher level amputation recommended smoking cessation during the wound healing phase.  He is on Plavix and may need to add Trental and may need to add a nitroglycerin patch to help with the microcirculation.  Plan for outpatient surgery this Friday.  Follow-Up Instructions: Return in about 2 weeks (around 10/20/2017).   Ortho Exam  Patient is alert, oriented, no adenopathy, well-dressed, normal affect, normal respiratory effort. Examination patient does have a palpable dorsalis pedis pulses skin is thin and atrophic.  He has no ascending cellulitis no drainage no odor.  After informed consent a 10 blade knife was used to debride the skin and soft tissue back to healthy viable bleeding granulation tissue this  was touched with silver nitrate the ulcer is 10 mm in diameter and probes to bone.  Review of his radiographs shows complete destruction of the fifth metatarsal head and base of the proximal phalanx consistent with chronic osteomyelitis.  Imaging: No results found. No images are attached to the encounter.  Labs: Lab Results  Component Value Date   HGBA1C 7.0 (H) 04/26/2015   HGBA1C 7.9 (H) 01/30/2015   HGBA1C 7.1 (H) 08/17/2014   ESRSEDRATE 5 12/17/2015   CRP <0.5 12/17/2015     Lab Results  Component Value Date   ALBUMIN 4.0 04/26/2015   ALBUMIN 4.5 03/30/2015   ALBUMIN 3.7 03/17/2015    There is no height or weight on file to calculate BMI.  Orders:  No orders of the defined types were placed in this encounter.  No orders of the defined types were placed in this encounter.    Procedures: No procedures performed  Clinical Data: No additional findings.  ROS:  All other systems negative, except as noted in the HPI. Review of Systems  Objective: Vital Signs: There were no vitals taken for this visit.  Specialty Comments:  No specialty comments available.  PMFS History: Patient Active Problem List   Diagnosis Date Noted  . Cigarette smoker 12/17/2015  . S/P femoral-popliteal bypass surgery 07/16/2015  . Atherosclerosis of native arteries of the extremities with ulceration ([place]) 07/16/2015  .  Aftercare following surgery of the circulatory system 07/16/2015  . Sleep apnea 04/27/2015  . Osteomyelitis ([place]) 03/30/2015  . Diabetic foot ([place]) 03/30/2015  . Acute osteomyelitis of left foot ([place]) 03/16/2015  . Septic arthritis of left foot ([place]) 03/16/2015  . Dyslipidemia associated with type 2 diabetes mellitus ([place]) 03/16/2015  . Colon cancer screening 01/30/2015  . Left carotid bruit 12/11/2014  . PAD (peripheral artery disease) ([place]) 03/26/2014  . Hypertriglyceridemia 03/26/2014  . Osteoarthritis of right hip 04/09/2012  . Encounter for long-term (current)  use of other medications 10/16/2011  . Type 2 diabetes mellitus with vascular disease ([place]) 06/22/2009  . Hyperlipidemia with target LDL less than 100 04/16/2008  . Essential hypertension 04/16/2008  . CAD, NATIVE VESSEL 04/16/2008   Past Medical History:  Diagnosis Date  . Acute osteomyelitis of left foot ([place]) 03/16/2015  . CAD, NATIVE VESSEL 04/16/2008   Qualifier: Diagnosis of  By: Aundra Dubin, MD, [place]    . Cancer ([place])    non-hodgkins lymphoma  2000  recd radiation  . Cellulitis and abscess of leg, except foot 02/21/2015  . Colon cancer screening 01/30/2015  . Diabetic ulcer of left foot with necrosis of muscle ([place]) 02/21/2015  . Dyslipidemia associated with type 2 diabetes mellitus ([place]) 03/16/2015  . Encounter for long-term (current) use of other medications 10/16/2011  . Essential hypertension 04/16/2008   Qualifier: Diagnosis of  By: Aundra Dubin, MD, [place]    . Hyperlipidemia with target LDL less than 100 04/16/2008       . Hypertriglyceridemia 03/26/2014  . Left carotid bruit 12/11/2014  . Myocardial infarction ([place])    2009  . Osteoarthritis of right hip 04/09/2012  . PAD (peripheral artery disease) ([place]) 03/26/2014  . Peripheral vascular disease, unspecified ([place]) 11/17/2011  . Septic arthritis of left foot ([place]) 03/16/2015  . Type 2 diabetes mellitus with vascular disease ([place]) 06/22/2009   Qualifier: Diagnosis of  By: Karrie Meres RN, BSN, [place]    . Uncontrolled diabetes mellitus type 2 with peripheral artery disease ([place]) 03/16/2015    Family History  Problem Relation Age of Onset  . Heart attack Mother        35's  . Heart disease Mother        Heart disease before age 66  . Hypertension Mother   . Hyperlipidemia Mother   . Peripheral Artery Disease Father   . Hypertension Father   . Hyperlipidemia Father   . Peripheral vascular disease Father   . Heart attack Unknown        40's/40's    Past Surgical History:  Procedure Laterality Date  . CARDIAC CATHETERIZATION      2011  . FEMORAL-TIBIAL BYPASS GRAFT Left 05/04/2015   Procedure: LEFT  FEMORAL-BELOW THE KNEE POPLITEAL  ARTERY BYPASS GRAFT USING NON REVERSE LEFT GREATER SAPHENOUS VEIN;  Surgeon: Serafina Mitchell, MD;  Location: [facility];  Service: Vascular;  Laterality: Left;  . INTRAOPERATIVE ARTERIOGRAM Left 05/04/2015   Procedure: INTRA OPERATIVE ARTERIOGRAM TIMES TWO;  Surgeon: Serafina Mitchell, MD;  Location: [facility];  Service: Vascular;  Laterality: Left;  . LEG SURGERY     on bone above ankle-not sure which bone left leg 1981  . LEG SURGERY  1981   Bone cyst- Left leg  . MULTIPLE EXTRACTIONS WITH ALVEOLOPLASTY  10/31/2011   Procedure: MULTIPLE EXTRACION WITH ALVEOLOPLASTY;  Surgeon: Lenn Cal, DDS;  Location: [facility];  Service: Oral Surgery;  Laterality: N/A;  Extraction of tooth #'s 2, 4,5,6,7,8,9,10,11,12,13,15,21,22,23,24,25,26,27,28, 29, and  31 with alveoloplasty  . PERIPHERAL VASCULAR CATHETERIZATION N/A 04/05/2015   Procedure: Abdominal Aortogram w/Lower Extremity;  Surgeon: Conrad Garfield, MD;  Location: [facility];  Service: Cardiovascular;  Laterality: N/A;  . PR VEIN BYPASS GRAFT,AORTO-FEM-POP    . TONSILLECTOMY     Social History   Occupational History    Comment: Disabled. Former Biomedical scientist.  Tobacco Use  . Smoking status: Light Tobacco Smoker    Packs/day: 0.50    Years: 43.00    Pack years: 21.50    Types: Cigarettes  . Smokeless tobacco: Never Used  . Tobacco comment: heavy 2nd hand exposue ongoing  Substance and Sexual Activity  . Alcohol use: No    Alcohol/week: 0.0 oz    Comment: former heavy user quit 2011  . Drug use: No  . Sexual activity: Not on file

## 2017-10-07 ENCOUNTER — Other Ambulatory Visit: Payer: Self-pay

## 2017-10-07 ENCOUNTER — Encounter (HOSPITAL_COMMUNITY): Payer: Self-pay | Admitting: *Deleted

## 2017-10-07 NOTE — Progress Notes (Signed)
Called pt back to ask where he was having the Echo done. He states at Dr. Antionette Char office. I told him that we do not see any orders for an Echo or where it was scheduled. He states he got a reminder from My Chart. He looked it up while I was on the phone with him and he found that it was not for this year, but it was the one he had 09/08/16. He said that he hasn't been able to follow up with Dr. Burt Knack due to all the foot problems that he has had. I told him that he did need to call and get an appt with him soon. I also instructed him to start his Plavix back (per Willeen Cass, NP) since Dr. Sharol Given is ok with him taking it and his medical issues warrant him staying on it. He voiced understanding.

## 2017-10-07 NOTE — Progress Notes (Addendum)
Spoke with pt for pre-op call. Pt has hx of MI, no stents in 2009. Pt denies any recent chest pain or sob. Pt states Dr. Burt Knack is having him have an Echo Friday AM before surgery. Pt states he was not instructed to stop Plavix or his Aspirin, he said Dr. Sharol Given told him he was ok with him staying on both. Pt states he stopped his Plavix on his own. Last dose was 10/06/17. Pt is a type 2 diabetic. Last A1C was 7.0 on 08/13/17. Pt states he does not check his blood sugar at home. Pt instructed not to take his Metformin or Glimepiride the morning of surgery. Instructed pt to take 1/2 of his regular dose of Toujeo Friday AM (will take 17 units).

## 2017-10-07 NOTE — Progress Notes (Signed)
Anesthesia Chart Review:  Pt is a same day work up   Case:  [id number] Date/Time:  10/09/17 1152   Procedure:  LEFT 5TH RAY AMPUTATION (Left )   Anesthesia type:  Choice   Pre-op diagnosis:  Osteomyelitis Left 5th Metatarsal Head   Location:  [facility] OR ROOM 06 / [facility]   Surgeon:  Newt Minion, MD      DISCUSSION: - Pt is a 62 year old male with hx CAD (total occlusion CX with faint collateralization, moderate disease LAD, RCA 2011), PAD (left FPBG 05/04/15; B SFA and popliteal occlusion), moderate carotid artery disease (by 2016 [place]), HTN, DM  - Last cardiology evaluation 08/22/16; pt reports he has not yet had annual f/u because office has not sent him reminder letter.   - Pt had stopped plavix on his own 10/06/17 in anticipation of surgery.  Dr. Sharol Given is ok with pt continuing on plavix perioperatively. Given pt's CV history, he was instructed to restart plavix 10/07/17.     PROVIDERS: Josetta Huddle, MD Cardiologist is Sherren Mocha, MD. Last office visit 08/22/16; 1 year f/u recommended but has not yet happened   LABS: Will be obtained day of surgery   EKG: Will be obtained day of surgery    CV:  Echo 09/08/16:  - Left ventricle: The cavity size was normal. Wall thickness was increased in a pattern of mild LVH. There was mild focal basal hypertrophy of the septum. Systolic function was mildly to moderately reduced. The estimated ejection fraction was in the range of 40% to 45%. There is hypokinesis of the mid-apical septal and apical myocardium. There is hypokinesis of the inferolateral myocardium. Doppler parameters are consistent with abnormal left ventricular relaxation (grade 1 diastolic dysfunction). Doppler parameters are consistent with high ventricular filling pressure. - Left atrium: The atrium was mildly dilated. - Impressions: Hypokinesis of the inferolateral wall, distal septum and apex with mild to moderate LV dysfunction; grade 1 diastolic dysfunction with elevated LV filling  pressure; mild LVH; mild LAE.  Nuclear stress test 04/24/15:   The left ventricular ejection fraction is mildly decreased (45-54%).  Nuclear stress EF: 45%.  There was no ST segment deviation noted during stress.  Defect 1: There is a medium defect of severe severity present in the basal inferolateral, basal anterolateral, mid inferolateral, mid anterolateral and apical lateral location.  Findings consistent with prior lateral wall myocardial infarction with small amount of peri-infarct ischemia.  This is an intermediate risk study.  Carotid duplex 12/15/14:  - Heterogeneous plaque, bilaterally. - 1-39% RICA stenosis - 10-93% LICA stenosis - Normal subclavian arteries, bilaterally. - Patent vertebral arteries with antegrade flow.  Cardiac cath 06/20/09:  1. LM: mild calcification and mild nonobstructive plaque 2. LAD: scattered plaque throughout. 40-50% stenosis proximally just before D1.  Diffuse mild stenoses in the mid and distal segments. Diagonal with mild nonobstructive disease.  3. LCX: totally occluded in its most proximal aspect. Some faint collateralization from RCA 4. RCA: supplies a PDA and PLA branch.  Mid RCA 30-40% stenosis. Distal vessel with mild nonobstructive plaque and posterior AV segment has diffuse 50% stenosis   Past Medical History:  Diagnosis Date  . Acute osteomyelitis of left foot ([place]) 03/16/2015  . Asthma    bronchitis to asthma many years  . CAD, NATIVE VESSEL 04/16/2008   Qualifier: Diagnosis of  By: Aundra Dubin, MD, [place]    . Cancer ([place])    non-hodgkins lymphoma  2000  recd radiation  . Cellulitis and  abscess of leg, except foot 02/21/2015  . Colon cancer screening 01/30/2015  . Diabetic ulcer of left foot with necrosis of muscle ([place]) 02/21/2015  . Dyslipidemia associated with type 2 diabetes mellitus ([place]) 03/16/2015  . Encounter for long-term (current) use of other medications 10/16/2011  . Essential hypertension 04/16/2008   Qualifier:  Diagnosis of  By: Aundra Dubin, MD, [place]    . Hyperlipidemia with target LDL less than 100 04/16/2008       . Hypertriglyceridemia 03/26/2014  . Left carotid bruit 12/11/2014  . Myocardial infarction ([place])    2009  . Osteoarthritis of right hip 04/09/2012  . PAD (peripheral artery disease) ([place]) 03/26/2014  . Peripheral vascular disease, unspecified ([place]) 11/17/2011  . Septic arthritis of left foot ([place]) 03/16/2015  . Type 2 diabetes mellitus with vascular disease ([place]) 06/22/2009   Qualifier: Diagnosis of  By: Karrie Meres RN, BSN, Anne    . Uncontrolled diabetes mellitus type 2 with peripheral artery disease ([place]) 03/16/2015    Past Surgical History:  Procedure Laterality Date  . CARDIAC CATHETERIZATION     2011  . FEMORAL-TIBIAL BYPASS GRAFT Left 05/04/2015   Procedure: LEFT  FEMORAL-BELOW THE KNEE POPLITEAL  ARTERY BYPASS GRAFT USING NON REVERSE LEFT GREATER SAPHENOUS VEIN;  Surgeon: Serafina Mitchell, MD;  Location: [facility];  Service: Vascular;  Laterality: Left;  . INTRAOPERATIVE ARTERIOGRAM Left 05/04/2015   Procedure: INTRA OPERATIVE ARTERIOGRAM TIMES TWO;  Surgeon: Serafina Mitchell, MD;  Location: [facility];  Service: Vascular;  Laterality: Left;  . LEG SURGERY     on bone above ankle-not sure which bone left leg 1981  . LEG SURGERY  1981   Bone cyst- Left leg  . MULTIPLE EXTRACTIONS WITH ALVEOLOPLASTY  10/31/2011   Procedure: MULTIPLE EXTRACION WITH ALVEOLOPLASTY;  Surgeon: Lenn Cal, DDS;  Location: [facility];  Service: Oral Surgery;  Laterality: N/A;  Extraction of tooth #'s 2, 4,5,6,7,8,9,10,11,12,13,15,21,22,23,24,25,26,27,28, 29, and 31 with alveoloplasty  . PERIPHERAL VASCULAR CATHETERIZATION N/A 04/05/2015   Procedure: Abdominal Aortogram w/Lower Extremity;  Surgeon: Conrad Victoria, MD;  Location: [facility];  Service: Cardiovascular;  Laterality: N/A;  . PR VEIN BYPASS GRAFT,AORTO-FEM-POP    . TONSILLECTOMY      MEDICATIONS: No current facility-administered medications for  this encounter.    Marland Kitchen aspirin 81 MG tablet  . clopidogrel (PLAVIX) 75 MG tablet  . glimepiride (AMARYL) 1 MG tablet  . Insulin Glargine (TOUJEO SOLOSTAR) 300 UNIT/ML SOPN  . isosorbide mononitrate (IMDUR) 30 MG 24 hr tablet  . lisinopril (PRINIVIL,ZESTRIL) 40 MG tablet  . metFORMIN (GLUCOPHAGE) 1000 MG tablet  . metoprolol tartrate (LOPRESSOR) 50 MG tablet  . naproxen (NAPROSYN) 375 MG tablet  . pravastatin (PRAVACHOL) 80 MG tablet  . rifampin (RIFADIN) 300 MG capsule  . sulfamethoxazole-trimethoprim (BACTRIM DS,SEPTRA DS) 800-160 MG tablet  . cilostazol (PLETAL) 100 MG tablet  . glucose blood (COOL BLOOD GLUCOSE TEST STRIPS) test strip  . Insulin Pen Needle (NOVOFINE) 32G X 6 MM MISC  . TRUEPLUS LANCETS 28G MISC    If labs and EKG acceptable day of surgery, and pt has no active CV symptoms, I anticipate pt can proceed with surgery as scheduled.  Willeen Cass, FNP-BC [facility]/Anesthesiology Phone: [phone] 10/07/2017 4:19 PM

## 2017-10-08 ENCOUNTER — Other Ambulatory Visit (INDEPENDENT_AMBULATORY_CARE_PROVIDER_SITE_OTHER): Payer: Self-pay | Admitting: Orthopedic Surgery

## 2017-10-08 DIAGNOSIS — M86272 Subacute osteomyelitis, left ankle and foot: Secondary | ICD-10-CM

## 2017-10-08 NOTE — Anesthesia Preprocedure Evaluation (Addendum)
Anesthesia Evaluation  Patient identified by MRN, date of birth, ID band Patient awake    Reviewed: Allergy & Precautions, NPO status , Patient's Chart, lab work & pertinent test results  Airway Mallampati: II  TM Distance: >3 FB Neck ROM: Full    Dental no notable dental hx. (+) Edentulous Upper, Edentulous Lower, Dental Advisory Given   Pulmonary asthma , sleep apnea , Current Smoker,    Pulmonary exam normal breath sounds clear to auscultation       Cardiovascular hypertension, Pt. on medications + CAD, + Past MI and + Peripheral Vascular Disease  Normal cardiovascular exam Rhythm:Regular Rate:Normal  Echo 09/08/16:  - Left ventricle: The cavity size was normal. Wall thickness wasincreased in a pattern of mild LVH. There was mild focal basalhypertrophy of the septum. Systolic function was mildly tomoderately reduced. The estimated ejection fraction was in therange of 40% to 45%. There is hypokinesis of the mid-apicalseptal and apical myocardium. There is hypokinesis of theinferolateral myocardium     Neuro/Psych negative psych ROS   GI/Hepatic Neg liver ROS,   Endo/Other  diabetes, Type 2, Insulin Dependent  Renal/GU negative Renal ROS     Musculoskeletal  (+) Arthritis , Osteoarthritis,    Abdominal   Peds  Hematology   Anesthesia Other Findings   Reproductive/Obstetrics                           Lab Results  Component Value Date   CREATININE 1.07 12/17/2015   BUN 10 12/17/2015   NA 135 12/17/2015   K 4.8 12/17/2015   CL 102 12/17/2015   CO2 22 12/17/2015    Lab Results  Component Value Date   WBC 7.9 12/17/2015   HGB 15.2 12/17/2015   HCT 43.7 12/17/2015   MCV 97.1 12/17/2015   PLT 242 12/17/2015    Anesthesia Physical Anesthesia Plan  ASA: III  Anesthesia Plan: Regional and General   Post-op Pain Management: GA combined w/ Regional for post-op pain   Induction:  Intravenous  PONV Risk Score and Plan: Treatment may vary due to age or medical condition  Airway Management Planned: LMA  Additional Equipment:   Intra-op Plan:   Post-operative Plan:   Informed Consent: I have reviewed the patients History and Physical, chart, labs and discussed the procedure including the risks, benefits and alternatives for the proposed anesthesia with the patient or authorized representative who has indicated his/her understanding and acceptance.   Dental advisory given  Plan Discussed with: CRNA, Anesthesiologist and Surgeon  Anesthesia Plan Comments:        Anesthesia Quick Evaluation

## 2017-10-09 ENCOUNTER — Encounter (HOSPITAL_COMMUNITY)
Admission: RE | Disposition: A | Discharge status: Home / Self Care | Payer: Self-pay | Source: Ambulatory Visit | Attending: Orthopedic Surgery

## 2017-10-09 ENCOUNTER — Ambulatory Visit (HOSPITAL_COMMUNITY): Payer: Medicare HMO | Admitting: Emergency Medicine

## 2017-10-09 ENCOUNTER — Ambulatory Visit (HOSPITAL_COMMUNITY)
Admission: RE | Admit: 2017-10-09 | Discharge: 2017-10-09 | Disposition: A | Discharge status: Home / Self Care | Payer: Medicare HMO | Source: Ambulatory Visit | Attending: Orthopedic Surgery | Admitting: Orthopedic Surgery

## 2017-10-09 ENCOUNTER — Other Ambulatory Visit: Payer: Self-pay

## 2017-10-09 ENCOUNTER — Encounter (HOSPITAL_COMMUNITY): Payer: Self-pay

## 2017-10-09 DIAGNOSIS — E11621 Type 2 diabetes mellitus with foot ulcer: Secondary | ICD-10-CM | POA: Insufficient documentation

## 2017-10-09 DIAGNOSIS — I10 Essential (primary) hypertension: Secondary | ICD-10-CM | POA: Diagnosis not present

## 2017-10-09 DIAGNOSIS — Z79899 Other long term (current) drug therapy: Secondary | ICD-10-CM | POA: Insufficient documentation

## 2017-10-09 DIAGNOSIS — I252 Old myocardial infarction: Secondary | ICD-10-CM | POA: Diagnosis not present

## 2017-10-09 DIAGNOSIS — Z8249 Family history of ischemic heart disease and other diseases of the circulatory system: Secondary | ICD-10-CM | POA: Insufficient documentation

## 2017-10-09 DIAGNOSIS — Z95820 Peripheral vascular angioplasty status with implants and grafts: Secondary | ICD-10-CM | POA: Insufficient documentation

## 2017-10-09 DIAGNOSIS — Z91018 Allergy to other foods: Secondary | ICD-10-CM | POA: Insufficient documentation

## 2017-10-09 DIAGNOSIS — J45909 Unspecified asthma, uncomplicated: Secondary | ICD-10-CM | POA: Insufficient documentation

## 2017-10-09 DIAGNOSIS — I2510 Atherosclerotic heart disease of native coronary artery without angina pectoris: Secondary | ICD-10-CM | POA: Insufficient documentation

## 2017-10-09 DIAGNOSIS — Z888 Allergy status to other drugs, medicaments and biological substances status: Secondary | ICD-10-CM | POA: Diagnosis not present

## 2017-10-09 DIAGNOSIS — Z923 Personal history of irradiation: Secondary | ICD-10-CM | POA: Diagnosis not present

## 2017-10-09 DIAGNOSIS — M167 Other unilateral secondary osteoarthritis of hip: Secondary | ICD-10-CM | POA: Insufficient documentation

## 2017-10-09 DIAGNOSIS — Z7902 Long term (current) use of antithrombotics/antiplatelets: Secondary | ICD-10-CM | POA: Insufficient documentation

## 2017-10-09 DIAGNOSIS — Z791 Long term (current) use of non-steroidal anti-inflammatories (NSAID): Secondary | ICD-10-CM | POA: Diagnosis not present

## 2017-10-09 DIAGNOSIS — E785 Hyperlipidemia, unspecified: Secondary | ICD-10-CM | POA: Insufficient documentation

## 2017-10-09 DIAGNOSIS — Z794 Long term (current) use of insulin: Secondary | ICD-10-CM | POA: Insufficient documentation

## 2017-10-09 DIAGNOSIS — I739 Peripheral vascular disease, unspecified: Secondary | ICD-10-CM | POA: Diagnosis not present

## 2017-10-09 DIAGNOSIS — M86272 Subacute osteomyelitis, left ankle and foot: Secondary | ICD-10-CM

## 2017-10-09 DIAGNOSIS — Z91048 Other nonmedicinal substance allergy status: Secondary | ICD-10-CM | POA: Diagnosis not present

## 2017-10-09 DIAGNOSIS — M86172 Other acute osteomyelitis, left ankle and foot: Secondary | ICD-10-CM | POA: Diagnosis not present

## 2017-10-09 DIAGNOSIS — Z7982 Long term (current) use of aspirin: Secondary | ICD-10-CM | POA: Diagnosis not present

## 2017-10-09 DIAGNOSIS — F17210 Nicotine dependence, cigarettes, uncomplicated: Secondary | ICD-10-CM | POA: Diagnosis not present

## 2017-10-09 DIAGNOSIS — G8918 Other acute postprocedural pain: Secondary | ICD-10-CM | POA: Diagnosis not present

## 2017-10-09 DIAGNOSIS — E1142 Type 2 diabetes mellitus with diabetic polyneuropathy: Secondary | ICD-10-CM | POA: Diagnosis not present

## 2017-10-09 DIAGNOSIS — M869 Osteomyelitis, unspecified: Secondary | ICD-10-CM | POA: Diagnosis not present

## 2017-10-09 DIAGNOSIS — L97523 Non-pressure chronic ulcer of other part of left foot with necrosis of muscle: Secondary | ICD-10-CM | POA: Diagnosis not present

## 2017-10-09 DIAGNOSIS — E1151 Type 2 diabetes mellitus with diabetic peripheral angiopathy without gangrene: Secondary | ICD-10-CM | POA: Insufficient documentation

## 2017-10-09 DIAGNOSIS — Z8572 Personal history of non-Hodgkin lymphomas: Secondary | ICD-10-CM | POA: Diagnosis not present

## 2017-10-09 DIAGNOSIS — E1169 Type 2 diabetes mellitus with other specified complication: Secondary | ICD-10-CM | POA: Insufficient documentation

## 2017-10-09 HISTORY — PX: AMPUTATION: SHX166

## 2017-10-09 HISTORY — DX: Unspecified asthma, uncomplicated: J45.909

## 2017-10-09 LAB — BASIC METABOLIC PANEL
Anion gap: 10 (ref 5–15)
BUN: 19 mg/dL (ref 6–20)
CHLORIDE: 106 mmol/L (ref 101–111)
CO2: 20 mmol/L — AB (ref 22–32)
Calcium: 9.1 mg/dL (ref 8.9–10.3)
Creatinine, Ser: 1.25 mg/dL — ABNORMAL HIGH (ref 0.61–1.24)
GFR calc Af Amer: >60 mL/min (ref >60)
GFR calc non Af Amer: >60 mL/min (ref >60)
Glucose, Bld: 101 mg/dL — ABNORMAL HIGH (ref 65–99)
POTASSIUM: 5.2 mmol/L — AB (ref 3.5–5.1)
SODIUM: 136 mmol/L (ref 135–145)

## 2017-10-09 LAB — CBC
HEMATOCRIT: 44.7 % (ref 39.0–52.0)
HEMOGLOBIN: 15.1 g/dL (ref 13.0–17.0)
MCH: 31.7 pg (ref 26.0–34.0)
MCHC: 33.8 g/dL (ref 30.0–36.0)
MCV: 93.9 fL (ref 78.0–100.0)
Platelets: 243 10*3/uL (ref 150–400)
RBC: 4.76 MIL/uL (ref 4.22–5.81)
RDW: 13.3 % (ref 11.5–15.5)
WBC: 10.3 10*3/uL (ref 4.0–10.5)

## 2017-10-09 LAB — GLUCOSE, CAPILLARY
Glucose-Capillary: 113 mg/dL — ABNORMAL HIGH (ref 65–99)
Glucose-Capillary: 93 mg/dL (ref 65–99)

## 2017-10-09 SURGERY — AMPUTATION, FOOT, RAY
Anesthesia: Regional | Laterality: Left

## 2017-10-09 MED ORDER — PROPOFOL 10 MG/ML IV BOLUS
INTRAVENOUS | Status: DC | PRN
  Administered 2017-10-09: 150 mg via INTRAVENOUS

## 2017-10-09 MED ORDER — FENTANYL CITRATE (PF) 250 MCG/5ML IJ SOLN
INTRAMUSCULAR | Status: AC
  Filled 2017-10-09: qty 5

## 2017-10-09 MED ORDER — ONDANSETRON HCL 4 MG/2ML IJ SOLN
INTRAMUSCULAR | Status: AC
  Filled 2017-10-09: qty 2

## 2017-10-09 MED ORDER — FENTANYL CITRATE (PF) 100 MCG/2ML IJ SOLN
INTRAMUSCULAR | Status: AC
  Filled 2017-10-09: qty 2

## 2017-10-09 MED ORDER — PROMETHAZINE HCL 25 MG/ML IJ SOLN: 6.2500 mg | INTRAMUSCULAR | Status: DC | PRN

## 2017-10-09 MED ORDER — 0.9 % SODIUM CHLORIDE (POUR BTL) OPTIME
TOPICAL | Status: DC | PRN
  Administered 2017-10-09: 1000 mL

## 2017-10-09 MED ORDER — HYDROMORPHONE HCL 2 MG/ML IJ SOLN: 0.2500 mg | INTRAMUSCULAR | Status: DC | PRN

## 2017-10-09 MED ORDER — GABAPENTIN 300 MG PO CAPS
300.0000 mg | ORAL_CAPSULE | Freq: Once | ORAL | Status: AC
  Administered 2017-10-09: 300 mg via ORAL
  Filled 2017-10-09: qty 1

## 2017-10-09 MED ORDER — MEPERIDINE HCL 50 MG/ML IJ SOLN: 6.2500 mg | INTRAMUSCULAR | Status: DC | PRN

## 2017-10-09 MED ORDER — ROPIVACAINE HCL 5 MG/ML IJ SOLN
INTRAMUSCULAR | Status: DC | PRN
  Administered 2017-10-09: 30 mL via PERINEURAL

## 2017-10-09 MED ORDER — LACTATED RINGERS IV SOLN
INTRAVENOUS | Status: DC | PRN
  Administered 2017-10-09: 09:00:00 via INTRAVENOUS

## 2017-10-09 MED ORDER — HYDROCODONE-ACETAMINOPHEN 7.5-325 MG PO TABS: 1.0000 | ORAL_TABLET | Freq: Once | ORAL | Status: DC | PRN

## 2017-10-09 MED ORDER — ACETAMINOPHEN 500 MG PO TABS
1000.0000 mg | ORAL_TABLET | Freq: Once | ORAL | Status: AC
  Administered 2017-10-09: 1000 mg via ORAL
  Filled 2017-10-09: qty 2

## 2017-10-09 MED ORDER — HYDROCODONE-ACETAMINOPHEN 5-325 MG PO TABS: 1.0000 | ORAL_TABLET | ORAL | 0 refills | Status: DC | PRN

## 2017-10-09 MED ORDER — ONDANSETRON HCL 4 MG/2ML IJ SOLN
INTRAMUSCULAR | Status: DC | PRN
  Administered 2017-10-09: 4 mg via INTRAVENOUS

## 2017-10-09 MED ORDER — FENTANYL CITRATE (PF) 100 MCG/2ML IJ SOLN
INTRAMUSCULAR | Status: DC | PRN
  Administered 2017-10-09: 50 ug via INTRAVENOUS

## 2017-10-09 MED ORDER — LIDOCAINE HCL (CARDIAC) PF 100 MG/5ML IV SOSY
PREFILLED_SYRINGE | INTRAVENOUS | Status: DC | PRN
  Administered 2017-10-09: 100 mg via INTRAVENOUS

## 2017-10-09 MED ORDER — MIDAZOLAM HCL 2 MG/2ML IJ SOLN
INTRAMUSCULAR | Status: AC
  Administered 2017-10-09: 2 mg
  Filled 2017-10-09: qty 2

## 2017-10-09 MED ORDER — PHENYLEPHRINE HCL 10 MG/ML IJ SOLN
INTRAMUSCULAR | Status: DC | PRN
  Administered 2017-10-09: 120 ug via INTRAVENOUS
  Administered 2017-10-09: 160 ug via INTRAVENOUS
  Administered 2017-10-09: 120 ug via INTRAVENOUS

## 2017-10-09 MED ORDER — EPHEDRINE SULFATE 50 MG/ML IJ SOLN
INTRAMUSCULAR | Status: DC | PRN
  Administered 2017-10-09: 15 mg via INTRAVENOUS
  Administered 2017-10-09 (×2): 10 mg via INTRAVENOUS

## 2017-10-09 MED ORDER — ACETAMINOPHEN 10 MG/ML IV SOLN: 1000.0000 mg | Freq: Once | INTRAVENOUS | Status: DC | PRN

## 2017-10-09 MED ORDER — CEFAZOLIN SODIUM-DEXTROSE 2-4 GM/100ML-% IV SOLN
2.0000 g | INTRAVENOUS | Status: AC
  Administered 2017-10-09: 2 g via INTRAVENOUS
  Filled 2017-10-09: qty 100

## 2017-10-09 MED ORDER — EPHEDRINE SULFATE 50 MG/ML IJ SOLN
INTRAMUSCULAR | Status: AC
  Filled 2017-10-09: qty 2

## 2017-10-09 MED ORDER — POVIDONE-IODINE 7.5 % EX SOLN
Freq: Once | CUTANEOUS | Status: DC
  Filled 2017-10-09: qty 118

## 2017-10-09 MED ORDER — PROPOFOL 10 MG/ML IV BOLUS
INTRAVENOUS | Status: AC
  Filled 2017-10-09: qty 20

## 2017-10-09 SURGICAL SUPPLY — 33 items
BLADE SAW SGTL MED 73X18.5 STR (BLADE) IMPLANT
BLADE SURG 21 STRL SS (BLADE) IMPLANT
BNDG COHESIVE 4X5 TAN STRL (GAUZE/BANDAGES/DRESSINGS) ×3 IMPLANT
BNDG GAUZE ELAST 4 BULKY (GAUZE/BANDAGES/DRESSINGS) ×3 IMPLANT
COVER SURGICAL LIGHT HANDLE (MISCELLANEOUS) ×3 IMPLANT
DRAPE U-SHAPE 47X51 STRL (DRAPES) ×6 IMPLANT
DRESSING PREVENA PLUS CUSTOM (GAUZE/BANDAGES/DRESSINGS) IMPLANT
DRSG ADAPTIC 3X8 NADH LF (GAUZE/BANDAGES/DRESSINGS) IMPLANT
DRSG PAD ABDOMINAL 8X10 ST (GAUZE/BANDAGES/DRESSINGS) IMPLANT
DRSG PREVENA PLUS CUSTOM (GAUZE/BANDAGES/DRESSINGS)
DURAPREP 26ML APPLICATOR (WOUND CARE) ×3 IMPLANT
ELECT REM PT RETURN 9FT ADLT (ELECTROSURGICAL) ×3
ELECTRODE REM PT RTRN 9FT ADLT (ELECTROSURGICAL) ×1 IMPLANT
GAUZE SPONGE 4X4 12PLY STRL (GAUZE/BANDAGES/DRESSINGS) IMPLANT
GLOVE BIOGEL PI IND STRL 9 (GLOVE) ×1 IMPLANT
GLOVE BIOGEL PI INDICATOR 9 (GLOVE) ×2
GLOVE SURG ORTHO 9.0 STRL STRW (GLOVE) ×3 IMPLANT
GOWN STRL REUS W/ TWL XL LVL3 (GOWN DISPOSABLE) ×3 IMPLANT
GOWN STRL REUS W/TWL XL LVL3 (GOWN DISPOSABLE) ×6
KIT BASIN OR (CUSTOM PROCEDURE TRAY) ×3 IMPLANT
KIT DRSG PREVENA PLUS 7DAY 125 (MISCELLANEOUS) ×3 IMPLANT
KIT PREVENA INCISION MGT 13 (CANNISTER) ×3 IMPLANT
KIT TURNOVER KIT B (KITS) ×3 IMPLANT
NS IRRIG 1000ML POUR BTL (IV SOLUTION) ×3 IMPLANT
PACK ORTHO EXTREMITY (CUSTOM PROCEDURE TRAY) ×3 IMPLANT
PAD ARMBOARD 7.5X6 YLW CONV (MISCELLANEOUS) IMPLANT
STOCKINETTE IMPERVIOUS LG (DRAPES) IMPLANT
SUT ETHILON 2 0 PSLX (SUTURE) ×3 IMPLANT
TOWEL OR 17X26 10 PK STRL BLUE (TOWEL DISPOSABLE) ×3 IMPLANT
TUBE CONNECTING 12'X1/4 (SUCTIONS) ×1
TUBE CONNECTING 12X1/4 (SUCTIONS) ×2 IMPLANT
WND VAC CANISTER 500ML (MISCELLANEOUS) IMPLANT
YANKAUER SUCT BULB TIP NO VENT (SUCTIONS) ×3 IMPLANT

## 2017-10-09 NOTE — Anesthesia Postprocedure Evaluation (Signed)
Anesthesia Post Note  Patient: [name]  Procedure(s) Performed: LEFT 5TH RAY AMPUTATION (Left )     Patient location during evaluation: PACU Anesthesia Type: Regional and General Level of consciousness: awake and alert Pain management: pain level controlled Vital Signs Assessment: post-procedure vital signs reviewed and stable Respiratory status: spontaneous breathing, nonlabored ventilation, respiratory function stable and patient connected to nasal cannula oxygen Cardiovascular status: blood pressure returned to baseline and stable Postop Assessment: no apparent nausea or vomiting Anesthetic complications: no    Last Vitals:  Vitals:   10/09/17 1130 10/09/17 1145  BP: 124/63 139/72  Pulse: 85 82  Resp: 16   Temp: 36.6 C   SpO2: 93% 93%    Last Pain:  Vitals:   10/09/17 1145  TempSrc:   PainSc: 0-No pain                 Barnet Glasgow

## 2017-10-09 NOTE — Anesthesia Procedure Notes (Signed)
Anesthesia Regional Block: Popliteal block   Pre-Anesthetic Checklist: ,, timeout performed, Correct Patient, Correct Site, Correct Laterality, Correct Procedure, Correct Position, site marked, Risks and benefits discussed, pre-op evaluation,  At surgeon's request and post-op pain management  Laterality: Left  Prep: Maximum Sterile Barrier Precautions used, chloraprep       Needles:  Injection technique: Single-shot  Needle Type: Echogenic Needle     Needle Length: 9cm  Needle Gauge: 21     Additional Needles:   Procedures:, nerve stimulator,,, ultrasound used (permanent image in chart),,,,   Nerve Stimulator or Paresthesia:  Response: Peroneal,  Response: Tibial,   Additional Responses:   Narrative:  Start time: 10/09/2017 9:08 AM End time: 10/09/2017 9:19 AM Injection made incrementally with aspirations every 5 mL. Anesthesiologist: Barnet Glasgow, MD

## 2017-10-09 NOTE — Op Note (Signed)
10/09/2017  11:14 AM  PATIENT:  [name]    PRE-OPERATIVE DIAGNOSIS:  Osteomyelitis Left 5th Metatarsal Head  POST-OPERATIVE DIAGNOSIS:  Same  PROCEDURE:  LEFT 5TH RAY AMPUTATION, Local tissue rearrangement for wound closure 3 x 7 cm. Application of Praveena wound VAC. Strapping left leg.  SURGEON:  Newt Minion, MD  PHYSICIAN ASSISTANT:None ANESTHESIA:   General  PREOPERATIVE INDICATIONS:  [name] is a  62 y.o. male with a diagnosis of Osteomyelitis Left 5th Metatarsal Head who failed conservative measures and elected for surgical management.    The risks benefits and alternatives were discussed with the patient preoperatively including but not limited to the risks of infection, bleeding, nerve injury, cardiopulmonary complications, the need for revision surgery, among others, and the patient was willing to proceed.  OPERATIVE IMPLANTS: Praveena wound VAC  @ENCIMAGES @  OPERATIVE FINDINGS: Minimal petechial bleeding  OPERATIVE PROCEDURE: Patient was brought the operating room after undergoing a popliteal block he then underwent a general anesthetic.  After adequate levels of anesthesia were obtained patient's left lower extremity was prepped using DuraPrep draped into a sterile field a timeout was called.  An elliptical incision was made around the ulcer and the toe.  The ulcer and fifth ray were resected in one block of tissue.  The wound was irrigated with normal saline electrocautery was used for hemostasis.  Local tissue rearrangement was used to close the wound 3 x 7 cm.  A Praveena wound VAC was applied this had a good suction fit.  The leg was wrapped with web roll and Covan for a 2 layer compression wrap due to the venous insufficiency.  Patient was extubated taken the PACU in stable condition.   DISCHARGE PLANNING:  Antibiotic duration: Preoperative antibiotics  Weightbearing: Nonweightbearing on the left  Pain medication: Prescription for  Vicodin  Dressing care/ Wound VAC: Continue wound VAC for 1 week  Ambulatory devices: Crutches or walker  Discharge to: Home  Follow-up: In the office 1 week post operative.

## 2017-10-09 NOTE — Anesthesia Procedure Notes (Signed)
Procedure Name: LMA Insertion Date/Time: 10/09/2017 10:35 AM Performed by: Carney Living, CRNA Patient Re-evaluated:Patient Re-evaluated prior to induction Oxygen Delivery Method: Circle system utilized Preoxygenation: Pre-oxygenation with 100% oxygen Induction Type: IV induction LMA: LMA flexible inserted LMA Size: 4.0 Number of attempts: 1 Placement Confirmation: positive ETCO2 and breath sounds checked- equal and bilateral Tube secured with: Tape Dental Injury: Teeth and Oropharynx as per pre-operative assessment

## 2017-10-09 NOTE — Progress Notes (Signed)
Orthopedic Tech Progress Note Patient Details:  [name] 04/11/1956 [id number]  Ortho Devices Type of Ortho Device: Crutches Ortho Device/Splint Interventions: Adjustment   Post Interventions Patient Tolerated: Well Instructions Provided: Care of device   [name] 10/09/2017, 11:42 AM

## 2017-10-09 NOTE — Transfer of Care (Signed)
Immediate Anesthesia Transfer of Care Note  Patient: [name]  Procedure(s) Performed: LEFT 5TH RAY AMPUTATION (Left )  Patient Location: PACU  Anesthesia Type:General and GA combined with regional for post-op pain  Level of Consciousness: awake, alert , oriented and patient cooperative  Airway & Oxygen Therapy: Patient Spontanous Breathing and Patient connected to nasal cannula oxygen  Post-op Assessment: Report given to RN, Post -op Vital signs reviewed and stable and Patient moving all extremities X 4  Post vital signs: Reviewed and stable  Last Vitals:  Vitals Value Taken Time  BP 116/57 10/09/2017 11:16 AM  Temp    Pulse 85 10/09/2017 11:18 AM  Resp 18 10/09/2017 11:18 AM  SpO2 95 % 10/09/2017 11:18 AM  Vitals shown include unvalidated device data.  Last Pain:  Vitals:   10/09/17 0849  TempSrc:   PainSc: 0-No pain      Patients Stated Pain Goal: 0 (38/25/05 3976)  Complications: No apparent anesthesia complications

## 2017-10-09 NOTE — H&P (Signed)
[name] is an 62 y.o. male.   Chief Complaint: Osteomyelitis ulceration left foot metatarsal head. HPI: Patient is a 62 year old gentleman diabetic insensate neuropathy chronic ulcer beneath the fifth metatarsal head he is good care and presents at this time prevention.  Past Medical History:  Diagnosis Date  . Acute osteomyelitis of left foot ([place]) 03/16/2015  . Asthma    bronchitis to asthma many years  . CAD, NATIVE VESSEL 04/16/2008   Qualifier: Diagnosis of  By: Aundra Dubin, MD, [place]    . Cancer ([place])    non-hodgkins lymphoma  2000  recd radiation  . Cellulitis and abscess of leg, except foot 02/21/2015  . Colon cancer screening 01/30/2015  . Diabetic ulcer of left foot with necrosis of muscle ([place]) 02/21/2015  . Dyslipidemia associated with type 2 diabetes mellitus ([place]) 03/16/2015  . Encounter for long-term (current) use of other medications 10/16/2011  . Essential hypertension 04/16/2008   Qualifier: Diagnosis of  By: Aundra Dubin, MD, [place]    . Hyperlipidemia with target LDL less than 100 04/16/2008       . Hypertriglyceridemia 03/26/2014  . Left carotid bruit 12/11/2014  . Myocardial infarction ([place])    2009  . Osteoarthritis of right hip 04/09/2012  . PAD (peripheral artery disease) ([place]) 03/26/2014  . Peripheral vascular disease, unspecified ([place]) 11/17/2011  . Septic arthritis of left foot ([place]) 03/16/2015  . Type 2 diabetes mellitus with vascular disease ([place]) 06/22/2009   Qualifier: Diagnosis of  By: Karrie Meres RN, BSN, Anne    . Uncontrolled diabetes mellitus type 2 with peripheral artery disease ([place]) 03/16/2015    Past Surgical History:  Procedure Laterality Date  . CARDIAC CATHETERIZATION     2011  . FEMORAL-TIBIAL BYPASS GRAFT Left 05/04/2015   Procedure: LEFT  FEMORAL-BELOW THE KNEE POPLITEAL  ARTERY BYPASS GRAFT USING NON REVERSE LEFT GREATER SAPHENOUS VEIN;  Surgeon: Serafina Mitchell, MD;  Location: [facility];  Service: Vascular;  Laterality: Left;  .  INTRAOPERATIVE ARTERIOGRAM Left 05/04/2015   Procedure: INTRA OPERATIVE ARTERIOGRAM TIMES TWO;  Surgeon: Serafina Mitchell, MD;  Location: [facility];  Service: Vascular;  Laterality: Left;  . LEG SURGERY     on bone above ankle-not sure which bone left leg 1981  . LEG SURGERY  1981   Bone cyst- Left leg  . MULTIPLE EXTRACTIONS WITH ALVEOLOPLASTY  10/31/2011   Procedure: MULTIPLE EXTRACION WITH ALVEOLOPLASTY;  Surgeon: Lenn Cal, DDS;  Location: [facility];  Service: Oral Surgery;  Laterality: N/A;  Extraction of tooth #'s 2, 4,5,6,7,8,9,10,11,12,13,15,21,22,23,24,25,26,27,28, 29, and 31 with alveoloplasty  . PERIPHERAL VASCULAR CATHETERIZATION N/A 04/05/2015   Procedure: Abdominal Aortogram w/Lower Extremity;  Surgeon: Conrad Fort Yates, MD;  Location: [facility];  Service: Cardiovascular;  Laterality: N/A;  . PR VEIN BYPASS GRAFT,AORTO-FEM-POP    . TONSILLECTOMY      Family History  Problem Relation Age of Onset  . Heart attack Mother        49's  . Heart disease Mother        Heart disease before age 24  . Hypertension Mother   . Hyperlipidemia Mother   . Peripheral Artery Disease Father   . Hypertension Father   . Hyperlipidemia Father   . Peripheral vascular disease Father   . Heart attack Unknown        40's/40's   Social History:  reports that he has been smoking cigarettes.  He has a 21.50 pack-year smoking history. He has never used smokeless tobacco.  He reports that he does not drink alcohol or use drugs.  Allergies:  Allergies  Allergen Reactions  . Fenofibrate Other (See Comments)    Joint pain/cramping/burning   . Pioglitazone Swelling and Rash    Edema unspecified peripheral edema  . Chlorhexidine Gluconate Rash  . Orange Oil Rash  . Soap Other (See Comments)    States that most soaps make him break out/rash-he uses a sensitive skin soap and shampoos as well, has to use specific allergen free soap and shampoo    Medications Prior to Admission  Medication Sig  Dispense Refill  . aspirin 81 MG tablet Take 81 mg by mouth daily.    . clopidogrel (PLAVIX) 75 MG tablet Take 1 tablet (75 mg total) by mouth daily. Please make overdue yearly appt with Dr. Burt Knack before anymore refills. 2nd attempt 15 tablet 0  . glimepiride (AMARYL) 1 MG tablet Take 1 mg by mouth daily with breakfast.    . Insulin Glargine (TOUJEO SOLOSTAR) 300 UNIT/ML SOPN Inject 35 Units into the skin daily.     . isosorbide mononitrate (IMDUR) 30 MG 24 hr tablet Take 1 tablet (30 mg total) by mouth 2 (two) times daily. Please call for office visit [phone] 180 tablet 3  . lisinopril (PRINIVIL,ZESTRIL) 40 MG tablet Take 1 tablet (40 mg total) by mouth daily. Please make overdue appt with Dr. Burt Knack before anymore refills. 2nd attempt 15 tablet 0  . metFORMIN (GLUCOPHAGE) 1000 MG tablet Take 1,000 mg by mouth 2 (two) times daily with a meal.    . metoprolol tartrate (LOPRESSOR) 50 MG tablet Take 1.5 tablets (75 mg total) by mouth 2 (two) times daily. Please call for office visit [phone] 270 tablet 1  . naproxen (NAPROSYN) 375 MG tablet Take 375 mg by mouth daily as needed (arthritis pain).     . pravastatin (PRAVACHOL) 80 MG tablet Take 1 tablet (80 mg total) by mouth daily. Please make yearly appt with Dr. Burt Knack for April before anymore refills. 1st attempt 90 tablet 0  . rifampin (RIFADIN) 300 MG capsule Take 300 mg by mouth 2 (two) times daily.  0  . sulfamethoxazole-trimethoprim (BACTRIM DS,SEPTRA DS) 800-160 MG tablet Take 1 tablet by mouth 2 (two) times daily.  0  . cilostazol (PLETAL) 100 MG tablet Take 1 tablet (100 mg total) by mouth 2 (two) times daily. Please make yearly appt with Dr. Burt Knack for April. 1st attempt (Patient not taking: Reported on 10/06/2017) 180 tablet 0  . glucose blood (COOL BLOOD GLUCOSE TEST STRIPS) test strip Use to check blood sugar twice a day  DX. E11.51 (Patient not taking: Reported on 10/06/2017) 100 each 12  . Insulin Pen Needle (NOVOFINE) 32G X 6 MM  MISC 1 Act by Does not apply route daily. Use 1 QD (Patient not taking: Reported on 10/06/2017) 100 each 3  . TRUEPLUS LANCETS 28G MISC Use to test blood sugar twice a day. DX: E11.51 (Patient not taking: Reported on 10/06/2017) 200 each 1    Results for orders placed or performed during the hospital encounter of 10/09/17 (from the past 48 hour(s))  CBC     Status: None   Collection Time: 10/09/17  8:38 AM  Result Value Ref Range   WBC 10.3 4.0 - 10.5 K/uL   RBC 4.76 4.22 - 5.81 MIL/uL   Hemoglobin 15.1 13.0 - 17.0 g/dL   HCT 44.7 39.0 - 52.0 %   MCV 93.9 78.0 - 100.0 fL   MCH 31.7 26.0 -  34.0 pg   MCHC 33.8 30.0 - 36.0 g/dL   RDW 13.3 11.5 - 15.5 %   Platelets 243 150 - 400 K/uL    Comment: Performed at [facility], [place] [address], [place], [place] [address]  Basic metabolic panel     Status: Abnormal   Collection Time: 10/09/17  8:38 AM  Result Value Ref Range   Sodium 136 135 - 145 mmol/L   Potassium 5.2 (H) 3.5 - 5.1 mmol/L   Chloride 106 101 - 111 mmol/L   CO2 20 (L) 22 - 32 mmol/L   Glucose, Bld 101 (H) 65 - 99 mg/dL   BUN 19 6 - 20 mg/dL   Creatinine, Ser 1.25 (H) 0.61 - 1.24 mg/dL   Calcium 9.1 8.9 - 10.3 mg/dL   GFR calc non Af Amer >60 >60 mL/min   GFR calc Af Amer >60 >60 mL/min    Comment: (NOTE) The eGFR has been calculated using the CKD EPI equation. This calculation has not been validated in all clinical situations. eGFR's persistently <60 mL/min signify possible Chronic Kidney Disease.    Anion gap 10 5 - 15    Comment: Performed at [facility] [address] [place], [place] [address]  Glucose, capillary     Status: Abnormal   Collection Time: 10/09/17  8:38 AM  Result Value Ref Range   Glucose-Capillary 113 (H) 65 - 99 mg/dL   No results found.  Review of Systems  All other systems reviewed and are negative.   Blood pressure 113/68, pulse 70, temperature 98.1 F (36.7 C), temperature source Oral, resp. rate 18, height 5' 10"  (1.778 m),  weight 189 lb (85.7 kg), SpO2 96 %. Physical Exam  Examination patient is alert oriented no adenopathy well-dressed normal affect normal respiratory effort he has a Waggoner grade 3 ulcer with osteomyelitis of the foot.  He does have pulses. Assessment/Plan Assessment: Diabetic insensate neuropathy with Waggoner grade 3 ulceration with osteomyelitis fifth metatarsal head left foot.  Plan: We will plan for left foot fifth ray amputation risk and benefits were discussed including risk of the wound not healing higher level amputation.  Patient states he understands wished to proceed with this time.  Newt Minion, MD 10/09/2017, 10:32 AM

## 2017-10-10 ENCOUNTER — Encounter (HOSPITAL_COMMUNITY): Payer: Self-pay | Admitting: Orthopedic Surgery

## 2017-10-12 ENCOUNTER — Telehealth (INDEPENDENT_AMBULATORY_CARE_PROVIDER_SITE_OTHER): Payer: Self-pay | Admitting: Orthopedic Surgery

## 2017-10-12 NOTE — Telephone Encounter (Signed)
Patient had a question for you in regards to his recent surgery. If you could give him a call back at [phone]

## 2017-10-13 NOTE — Telephone Encounter (Signed)
Pt is s/p a left foot 5th ray amputation on 10/09/17 he wanted to know if he could cut the tubing of his wound vac off so tht he would not have to have it attached any more. I advised that no he needs to make sure that the unit stays charged and functioning and that we will remove that for him on Thursday when he comes in for his first post op appt

## 2017-10-15 ENCOUNTER — Encounter (INDEPENDENT_AMBULATORY_CARE_PROVIDER_SITE_OTHER): Payer: Self-pay | Admitting: Orthopedic Surgery

## 2017-10-15 ENCOUNTER — Ambulatory Visit (INDEPENDENT_AMBULATORY_CARE_PROVIDER_SITE_OTHER): Payer: Medicare HMO | Admitting: Orthopedic Surgery

## 2017-10-15 DIAGNOSIS — Z89422 Acquired absence of other left toe(s): Secondary | ICD-10-CM

## 2017-10-15 DIAGNOSIS — S98132A Complete traumatic amputation of one left lesser toe, initial encounter: Secondary | ICD-10-CM

## 2017-10-15 NOTE — Progress Notes (Signed)
Office Visit Note   Patient: [name]           Date of Birth: 10/11/1955           MRN: [id number] Visit Date: 10/15/2017              Requested by: Josetta Huddle, MD [address] [place], [place] [address] PCP: Josetta Huddle, MD  Chief Complaint  Patient presents with  . Left Foot - Routine Post Op      HPI: Patient is a 62 year old gentleman who presents 1 week status post left foot fifth ray amputation.  The incisional wound VAC has been in place for 6 days.  Assessment & Plan: Visit Diagnoses:  1. Amputated toe of left foot ([place])     Plan: Examination wound edges well approximated he will start Dial soap cleansing dry dressing change nonweightbearing postoperative shoe reevaluate in 1 week to harvest the sutures.  Follow-Up Instructions: No follow-ups on file.   Ortho Exam  Patient is alert, oriented, no adenopathy, well-dressed, normal affect, normal respiratory effort. Examination there is a little bit of bleeding there is good approximation of the wound edges no cellulitis no ischemic changes no signs of infection or wound dehiscence.  Imaging: No results found. No images are attached to the encounter.  Labs: Lab Results  Component Value Date   HGBA1C 7.0 (H) 04/26/2015   HGBA1C 7.9 (H) 01/30/2015   HGBA1C 7.1 (H) 08/17/2014   ESRSEDRATE 5 12/17/2015   CRP <0.5 12/17/2015     Lab Results  Component Value Date   ALBUMIN 4.0 04/26/2015   ALBUMIN 4.5 03/30/2015   ALBUMIN 3.7 03/17/2015    There is no height or weight on file to calculate BMI.  Orders:  No orders of the defined types were placed in this encounter.  No orders of the defined types were placed in this encounter.    Procedures: No procedures performed  Clinical Data: No additional findings.  ROS:  All other systems negative, except as noted in the HPI. Review of Systems  Objective: Vital Signs: There were no vitals taken for this visit.  Specialty  Comments:  No specialty comments available.  PMFS History: Patient Active Problem List   Diagnosis Date Noted  . Cigarette smoker 12/17/2015  . S/P femoral-popliteal bypass surgery 07/16/2015  . Atherosclerosis of native arteries of the extremities with ulceration ([employer]) 07/16/2015  . Aftercare following surgery of the circulatory system 07/16/2015  . Sleep apnea 04/27/2015  . Subacute osteomyelitis, left ankle and foot ([place]) 03/30/2015  . Diabetic foot ([place]) 03/30/2015  . Acute osteomyelitis of left foot ([place]) 03/16/2015  . Septic arthritis of left foot ([place]) 03/16/2015  . Dyslipidemia associated with type 2 diabetes mellitus ([place]) 03/16/2015  . Colon cancer screening 01/30/2015  . Left carotid bruit 12/11/2014  . PAD (peripheral artery disease) ([place]) 03/26/2014  . Hypertriglyceridemia 03/26/2014  . Osteoarthritis of right hip 04/09/2012  . Encounter for long-term (current) use of other medications 10/16/2011  . Type 2 diabetes mellitus with vascular disease ([place]) 06/22/2009  . Hyperlipidemia with target LDL less than 100 04/16/2008  . Essential hypertension 04/16/2008  . CAD, NATIVE VESSEL 04/16/2008   Past Medical History:  Diagnosis Date  . Acute osteomyelitis of left foot ([place]) 03/16/2015  . Asthma    bronchitis to asthma many years  . CAD, NATIVE VESSEL 04/16/2008   Qualifier: Diagnosis of  By: Aundra Dubin, MD, [place]    . Cancer ([place])  non-hodgkins lymphoma  2000  recd radiation  . Cellulitis and abscess of leg, except foot 02/21/2015  . Colon cancer screening 01/30/2015  . Diabetic ulcer of left foot with necrosis of muscle ([place]) 02/21/2015  . Dyslipidemia associated with type 2 diabetes mellitus ([place]) 03/16/2015  . Encounter for long-term (current) use of other medications 10/16/2011  . Essential hypertension 04/16/2008   Qualifier: Diagnosis of  By: Aundra Dubin, MD, [place]    . Hyperlipidemia with target LDL less than 100 04/16/2008       . Hypertriglyceridemia 03/26/2014    . Left carotid bruit 12/11/2014  . Myocardial infarction ([place])    2009  . Osteoarthritis of right hip 04/09/2012  . PAD (peripheral artery disease) ([place]) 03/26/2014  . Peripheral vascular disease, unspecified ([place]) 11/17/2011  . Septic arthritis of left foot ([place]) 03/16/2015  . Type 2 diabetes mellitus with vascular disease ([place]) 06/22/2009   Qualifier: Diagnosis of  By: Karrie Meres RN, BSN, Anne    . Uncontrolled diabetes mellitus type 2 with peripheral artery disease ([place]) 03/16/2015    Family History  Problem Relation Age of Onset  . Heart attack Mother        69's  . Heart disease Mother        Heart disease before age 51  . Hypertension Mother   . Hyperlipidemia Mother   . Peripheral Artery Disease Father   . Hypertension Father   . Hyperlipidemia Father   . Peripheral vascular disease Father   . Heart attack Unknown        40's/40's    Past Surgical History:  Procedure Laterality Date  . AMPUTATION Left 10/09/2017   Procedure: LEFT 5TH RAY AMPUTATION;  Surgeon: Newt Minion, MD;  Location: [facility];  Service: Orthopedics;  Laterality: Left;  . CARDIAC CATHETERIZATION     2011  . FEMORAL-TIBIAL BYPASS GRAFT Left 05/04/2015   Procedure: LEFT  FEMORAL-BELOW THE KNEE POPLITEAL  ARTERY BYPASS GRAFT USING NON REVERSE LEFT GREATER SAPHENOUS VEIN;  Surgeon: Serafina Mitchell, MD;  Location: [facility];  Service: Vascular;  Laterality: Left;  . INTRAOPERATIVE ARTERIOGRAM Left 05/04/2015   Procedure: INTRA OPERATIVE ARTERIOGRAM TIMES TWO;  Surgeon: Serafina Mitchell, MD;  Location: [facility];  Service: Vascular;  Laterality: Left;  . LEG SURGERY     on bone above ankle-not sure which bone left leg 1981  . LEG SURGERY  1981   Bone cyst- Left leg  . MULTIPLE EXTRACTIONS WITH ALVEOLOPLASTY  10/31/2011   Procedure: MULTIPLE EXTRACION WITH ALVEOLOPLASTY;  Surgeon: Lenn Cal, DDS;  Location: [facility];  Service: Oral Surgery;  Laterality: N/A;  Extraction of tooth #'s 2,  4,5,6,7,8,9,10,11,12,13,15,21,22,23,24,25,26,27,28, 29, and 31 with alveoloplasty  . PERIPHERAL VASCULAR CATHETERIZATION N/A 04/05/2015   Procedure: Abdominal Aortogram w/Lower Extremity;  Surgeon: Conrad [place], MD;  Location: [facility];  Service: Cardiovascular;  Laterality: N/A;  . PR VEIN BYPASS GRAFT,AORTO-FEM-POP    . TONSILLECTOMY     Social History   Occupational History    Comment: Disabled. Former Biomedical scientist.  Tobacco Use  . Smoking status: Light Tobacco Smoker    Packs/day: 0.50    Years: 43.00    Pack years: 21.50    Types: Cigarettes  . Smokeless tobacco: Never Used  . Tobacco comment: heavy 2nd hand exposue ongoing  Substance and Sexual Activity  . Alcohol use: No    Alcohol/week: 0.0 oz    Comment: former heavy user quit 2011  . Drug use: No  .  Sexual activity: Not on file

## 2017-10-19 ENCOUNTER — Inpatient Hospital Stay (INDEPENDENT_AMBULATORY_CARE_PROVIDER_SITE_OTHER): Payer: Medicare HMO | Admitting: Orthopedic Surgery

## 2017-10-20 ENCOUNTER — Other Ambulatory Visit: Payer: Self-pay | Admitting: Cardiovascular Disease

## 2017-10-22 ENCOUNTER — Encounter (INDEPENDENT_AMBULATORY_CARE_PROVIDER_SITE_OTHER): Payer: Self-pay | Admitting: Orthopedic Surgery

## 2017-10-22 ENCOUNTER — Ambulatory Visit (INDEPENDENT_AMBULATORY_CARE_PROVIDER_SITE_OTHER): Payer: Medicare HMO | Admitting: Orthopedic Surgery

## 2017-10-22 VITALS — Ht 70.0 in | Wt 189.0 lb

## 2017-10-22 DIAGNOSIS — S98132A Complete traumatic amputation of one left lesser toe, initial encounter: Secondary | ICD-10-CM

## 2017-10-22 DIAGNOSIS — Z89422 Acquired absence of other left toe(s): Secondary | ICD-10-CM

## 2017-10-22 NOTE — Progress Notes (Signed)
Office Visit Note   Patient: [name]           Date of Birth: Jul 21, 1955           MRN: [id number] Visit Date: 10/22/2017              Requested by: Josetta Huddle, MD [address] [address] [place], [place] [address] PCP: Josetta Huddle, MD  Chief Complaint  Patient presents with  . Left Foot - Routine Post Op    10/09/17 left 5th ray amputation       HPI: Patient is a 62 year old gentleman who presents 2 weeks status post left foot fifth ray amputation.  The wound VAC is removed.  Assessment & Plan: Visit Diagnoses:  1. Amputated toe of left foot ([place])     Plan: Patient will start wearing a knee-high and 5 medical compression stocking he will wash the foot with soap and water only.  Continue nonweightbearing.  Follow-up in 2 weeks to remove the sutures.  Follow-Up Instructions: Return in about 2 weeks (around 11/05/2017).   Ortho Exam  Patient is alert, oriented, no adenopathy, well-dressed, normal affect, normal respiratory effort. Examination patient has a good dorsalis pedis pulse he has a well-healed incision the incision is gaped open about a half a millimeter there is no cellulitis no drainage no maceration no necrotic changes no signs of infection or wound dehiscence.  Imaging: No results found. No images are attached to the encounter.  Labs: Lab Results  Component Value Date   HGBA1C 7.0 (H) 04/26/2015   HGBA1C 7.9 (H) 01/30/2015   HGBA1C 7.1 (H) 08/17/2014   ESRSEDRATE 5 12/17/2015   CRP <0.5 12/17/2015     Lab Results  Component Value Date   ALBUMIN 4.0 04/26/2015   ALBUMIN 4.5 03/30/2015   ALBUMIN 3.7 03/17/2015    Body mass index is 27.12 kg/m.  Orders:  No orders of the defined types were placed in this encounter.  No orders of the defined types were placed in this encounter.    Procedures: No procedures performed  Clinical Data: No additional findings.  ROS:  All other systems negative, except as noted in the  HPI. Review of Systems  Objective: Vital Signs: Ht 5\' 10"  (1.778 m)   Wt 189 lb (85.7 kg)   BMI 27.12 kg/m   Specialty Comments:  No specialty comments available.  PMFS History: Patient Active Problem List   Diagnosis Date Noted  . Cigarette smoker 12/17/2015  . S/P femoral-popliteal bypass surgery 07/16/2015  . Atherosclerosis of native arteries of the extremities with ulceration ([place]) 07/16/2015  . Aftercare following surgery of the circulatory system 07/16/2015  . Sleep apnea 04/27/2015  . Subacute osteomyelitis, left ankle and foot ([place]) 03/30/2015  . Diabetic foot ([place]) 03/30/2015  . Acute osteomyelitis of left foot ([place]) 03/16/2015  . Septic arthritis of left foot ([place]) 03/16/2015  . Dyslipidemia associated with type 2 diabetes mellitus ([place]) 03/16/2015  . Colon cancer screening 01/30/2015  . Left carotid bruit 12/11/2014  . PAD (peripheral artery disease) ([place]) 03/26/2014  . Hypertriglyceridemia 03/26/2014  . Osteoarthritis of right hip 04/09/2012  . Encounter for long-term (current) use of other medications 10/16/2011  . Type 2 diabetes mellitus with vascular disease ([place]) 06/22/2009  . Hyperlipidemia with target LDL less than 100 04/16/2008  . Essential hypertension 04/16/2008  . CAD, NATIVE VESSEL 04/16/2008   Past Medical History:  Diagnosis Date  . Acute osteomyelitis of left foot ([place]) 03/16/2015  .  Asthma    bronchitis to asthma many years  . CAD, NATIVE VESSEL 04/16/2008   Qualifier: Diagnosis of  By: Aundra Dubin, MD, [place]    . Cancer ([place])    non-hodgkins lymphoma  2000  recd radiation  . Cellulitis and abscess of leg, except foot 02/21/2015  . Colon cancer screening 01/30/2015  . Diabetic ulcer of left foot with necrosis of muscle ([place]) 02/21/2015  . Dyslipidemia associated with type 2 diabetes mellitus ([place]) 03/16/2015  . Encounter for long-term (current) use of other medications 10/16/2011  . Essential hypertension 04/16/2008   Qualifier: Diagnosis of   By: Aundra Dubin, MD, Dalton    . Hyperlipidemia with target LDL less than 100 04/16/2008       . Hypertriglyceridemia 03/26/2014  . Left carotid bruit 12/11/2014  . Myocardial infarction ([place])    2009  . Osteoarthritis of right hip 04/09/2012  . PAD (peripheral artery disease) ([place]) 03/26/2014  . Peripheral vascular disease, unspecified ([place]) 11/17/2011  . Septic arthritis of left foot ([place]) 03/16/2015  . Type 2 diabetes mellitus with vascular disease ([place]) 06/22/2009   Qualifier: Diagnosis of  By: Karrie Meres RN, BSN, Anne    . Uncontrolled diabetes mellitus type 2 with peripheral artery disease ([place]) 03/16/2015    Family History  Problem Relation Age of Onset  . Heart attack Mother        18's  . Heart disease Mother        Heart disease before age 89  . Hypertension Mother   . Hyperlipidemia Mother   . Peripheral Artery Disease Father   . Hypertension Father   . Hyperlipidemia Father   . Peripheral vascular disease Father   . Heart attack Unknown        40's/40's    Past Surgical History:  Procedure Laterality Date  . AMPUTATION Left 10/09/2017   Procedure: LEFT 5TH RAY AMPUTATION;  Surgeon: Newt Minion, MD;  Location: [facility];  Service: Orthopedics;  Laterality: Left;  . CARDIAC CATHETERIZATION     2011  . FEMORAL-TIBIAL BYPASS GRAFT Left 05/04/2015   Procedure: LEFT  FEMORAL-BELOW THE KNEE POPLITEAL  ARTERY BYPASS GRAFT USING NON REVERSE LEFT GREATER SAPHENOUS VEIN;  Surgeon: Serafina Mitchell, MD;  Location: [facility];  Service: Vascular;  Laterality: Left;  . INTRAOPERATIVE ARTERIOGRAM Left 05/04/2015   Procedure: INTRA OPERATIVE ARTERIOGRAM TIMES TWO;  Surgeon: Serafina Mitchell, MD;  Location: [facility];  Service: Vascular;  Laterality: Left;  . LEG SURGERY     on bone above ankle-not sure which bone left leg 1981  . LEG SURGERY  1981   Bone cyst- Left leg  . MULTIPLE EXTRACTIONS WITH ALVEOLOPLASTY  10/31/2011   Procedure: MULTIPLE EXTRACION WITH ALVEOLOPLASTY;  Surgeon: Lenn Cal, DDS;  Location: [facility];  Service: Oral Surgery;  Laterality: N/A;  Extraction of tooth #'s 2, 4,5,6,7,8,9,10,11,12,13,15,21,22,23,24,25,26,27,28, 29, and 31 with alveoloplasty  . PERIPHERAL VASCULAR CATHETERIZATION N/A 04/05/2015   Procedure: Abdominal Aortogram w/Lower Extremity;  Surgeon: Conrad Worth, MD;  Location: [facility];  Service: Cardiovascular;  Laterality: N/A;  . PR VEIN BYPASS GRAFT,AORTO-FEM-POP    . TONSILLECTOMY     Social History   Occupational History    Comment: Disabled. Former Biomedical scientist.  Tobacco Use  . Smoking status: Light Tobacco Smoker    Packs/day: 0.50    Years: 43.00    Pack years: 21.50    Types: Cigarettes  . Smokeless tobacco: Never Used  . Tobacco comment: heavy 2nd hand  exposue ongoing  Substance and Sexual Activity  . Alcohol use: No    Alcohol/week: 0.0 oz    Comment: former heavy user quit 2011  . Drug use: No  . Sexual activity: Not on file

## 2017-10-29 ENCOUNTER — Encounter: Payer: Self-pay | Admitting: Cardiovascular Disease

## 2017-11-03 MED ORDER — CLOPIDOGREL BISULFATE 75 MG PO TABS: 75.0000 mg | ORAL_TABLET | Freq: Every day | ORAL | 0 refills | Status: DC

## 2017-11-03 MED ORDER — PRAVASTATIN SODIUM 80 MG PO TABS: 80.0000 mg | ORAL_TABLET | Freq: Every day | ORAL | 0 refills | Status: DC

## 2017-11-03 MED ORDER — LISINOPRIL 40 MG PO TABS: 40.0000 mg | ORAL_TABLET | Freq: Every day | ORAL | 0 refills | Status: DC

## 2017-11-03 NOTE — Telephone Encounter (Signed)
Pt's medications were sent to pt's pharmacy as requested. I advised pt to keep upcoming appt for future refills. Pt verbalized understanding. Confirmation received.

## 2017-11-10 ENCOUNTER — Encounter (INDEPENDENT_AMBULATORY_CARE_PROVIDER_SITE_OTHER): Payer: Self-pay | Admitting: Orthopedic Surgery

## 2017-11-10 ENCOUNTER — Ambulatory Visit (INDEPENDENT_AMBULATORY_CARE_PROVIDER_SITE_OTHER): Payer: Medicare HMO | Admitting: Orthopedic Surgery

## 2017-11-10 DIAGNOSIS — Z89422 Acquired absence of other left toe(s): Secondary | ICD-10-CM

## 2017-11-10 DIAGNOSIS — S98132A Complete traumatic amputation of one left lesser toe, initial encounter: Secondary | ICD-10-CM

## 2017-11-10 NOTE — Progress Notes (Signed)
Office Visit Note   Patient: [name]           Date of Birth: [birth year]           MRN: [id number] Visit Date: 11/10/2017              Requested by: Josetta Huddle, MD [address] [place] [place], [place] [address] PCP: Josetta Huddle, MD  Chief Complaint  Patient presents with  . Left Foot - Routine Post Op, Wound Check      HPI: Patient is a 62 year old gentleman status post left foot fifth ray amputation.  He is about 1 month out from surgery.  Assessment & Plan: Visit Diagnoses:  1. Amputated toe of left foot ([place])     Plan: The incision is well-healed the sutures are harvested he will advance to regular shoewear increase his activities as tolerated.  Follow-Up Instructions: Return in about 2 months (around 01/11/2018).   Ortho Exam  Patient is alert, oriented, no adenopathy, well-dressed, normal affect, normal respiratory effort. Examination he has dry skin he does has venous stasis swelling he states he tried wearing the compression socks but states he has an allergy to wool.  We will harvest the sutures today there is no redness no cellulitis no wound dehiscence no signs of infection he has dry cracked skin and recommended moisturizing lotion.  Imaging: No results found. No images are attached to the encounter.  Labs: Lab Results  Component Value Date   HGBA1C 7.0 (H) 04/26/2015   HGBA1C 7.9 (H) 01/30/2015   HGBA1C 7.1 (H) 08/17/2014   ESRSEDRATE 5 12/17/2015   CRP <0.5 12/17/2015     Lab Results  Component Value Date   ALBUMIN 4.0 04/26/2015   ALBUMIN 4.5 03/30/2015   ALBUMIN 3.7 03/17/2015    There is no height or weight on file to calculate BMI.  Orders:  No orders of the defined types were placed in this encounter.  No orders of the defined types were placed in this encounter.    Procedures: No procedures performed  Clinical Data: No additional findings.  ROS:  All other systems negative, except as noted in the  HPI. Review of Systems  Objective: Vital Signs: There were no vitals taken for this visit.  Specialty Comments:  No specialty comments available.  PMFS History: Patient Active Problem List   Diagnosis Date Noted  . Cigarette smoker 12/17/2015  . S/P femoral-popliteal bypass surgery 07/16/2015  . Atherosclerosis of native arteries of the extremities with ulceration ([place]) 07/16/2015  . Aftercare following surgery of the circulatory system 07/16/2015  . Sleep apnea 04/27/2015  . Subacute osteomyelitis, left ankle and foot ([place]) 03/30/2015  . Diabetic foot ([place]) 03/30/2015  . Acute osteomyelitis of left foot ([place]) 03/16/2015  . Septic arthritis of left foot ([place]) 03/16/2015  . Dyslipidemia associated with type 2 diabetes mellitus ([place]) 03/16/2015  . Colon cancer screening 01/30/2015  . Left carotid bruit 12/11/2014  . PAD (peripheral artery disease) ([place]) 03/26/2014  . Hypertriglyceridemia 03/26/2014  . Osteoarthritis of right hip 04/09/2012  . Encounter for long-term (current) use of other medications 10/16/2011  . Type 2 diabetes mellitus with vascular disease ([place]) 06/22/2009  . Hyperlipidemia with target LDL less than 100 04/16/2008  . Essential hypertension 04/16/2008  . CAD, NATIVE VESSEL 04/16/2008   Past Medical History:  Diagnosis Date  . Acute osteomyelitis of left foot ([place]) 03/16/2015  . Asthma    bronchitis to asthma many years  . CAD,  NATIVE VESSEL 04/16/2008   Qualifier: Diagnosis of  By: Aundra Dubin, MD, [place]    . Cancer ([place])    non-hodgkins lymphoma  2000  recd radiation  . Cellulitis and abscess of leg, except foot 02/21/2015  . Colon cancer screening 01/30/2015  . Diabetic ulcer of left foot with necrosis of muscle ([place]) 02/21/2015  . Dyslipidemia associated with type 2 diabetes mellitus ([place]) 03/16/2015  . Encounter for long-term (current) use of other medications 10/16/2011  . Essential hypertension 04/16/2008   Qualifier: Diagnosis of  By: Aundra Dubin, MD,  [place]    . Hyperlipidemia with target LDL less than 100 04/16/2008       . Hypertriglyceridemia 03/26/2014  . Left carotid bruit 12/11/2014  . Myocardial infarction ([place])    2009  . Osteoarthritis of right hip 04/09/2012  . PAD (peripheral artery disease) ([place]) 03/26/2014  . Peripheral vascular disease, unspecified ([place]) 11/17/2011  . Septic arthritis of left foot ([place]) 03/16/2015  . Type 2 diabetes mellitus with vascular disease ([place]) 06/22/2009   Qualifier: Diagnosis of  By: Karrie Meres RN, BSN, Anne    . Uncontrolled diabetes mellitus type 2 with peripheral artery disease ([place]) 03/16/2015    Family History  Problem Relation Age of Onset  . Heart attack Mother        47's  . Heart disease Mother        Heart disease before age 76  . Hypertension Mother   . Hyperlipidemia Mother   . Peripheral Artery Disease Father   . Hypertension Father   . Hyperlipidemia Father   . Peripheral vascular disease Father   . Heart attack Unknown        40's/40's    Past Surgical History:  Procedure Laterality Date  . AMPUTATION Left 10/09/2017   Procedure: LEFT 5TH RAY AMPUTATION;  Surgeon: Newt Minion, MD;  Location: [facility];  Service: Orthopedics;  Laterality: Left;  . CARDIAC CATHETERIZATION     2011  . FEMORAL-TIBIAL BYPASS GRAFT Left 05/04/2015   Procedure: LEFT  FEMORAL-BELOW THE KNEE POPLITEAL  ARTERY BYPASS GRAFT USING NON REVERSE LEFT GREATER SAPHENOUS VEIN;  Surgeon: Serafina Mitchell, MD;  Location: [facility];  Service: Vascular;  Laterality: Left;  . INTRAOPERATIVE ARTERIOGRAM Left 05/04/2015   Procedure: INTRA OPERATIVE ARTERIOGRAM TIMES TWO;  Surgeon: Serafina Mitchell, MD;  Location: [facility];  Service: Vascular;  Laterality: Left;  . LEG SURGERY     on bone above ankle-not sure which bone left leg 1981  . LEG SURGERY  1981   Bone cyst- Left leg  . MULTIPLE EXTRACTIONS WITH ALVEOLOPLASTY  10/31/2011   Procedure: MULTIPLE EXTRACION WITH ALVEOLOPLASTY;  Surgeon: Lenn Cal, DDS;   Location: [facility];  Service: Oral Surgery;  Laterality: N/A;  Extraction of tooth #'s 2, 4,5,6,7,8,9,10,11,12,13,15,21,22,23,24,25,26,27,28, 29, and 31 with alveoloplasty  . PERIPHERAL VASCULAR CATHETERIZATION N/A 04/05/2015   Procedure: Abdominal Aortogram w/Lower Extremity;  Surgeon: Conrad , MD;  Location: [facility];  Service: Cardiovascular;  Laterality: N/A;  . PR VEIN BYPASS GRAFT,AORTO-FEM-POP    . TONSILLECTOMY     Social History   Occupational History    Comment: Disabled. Former Biomedical scientist.  Tobacco Use  . Smoking status: Light Tobacco Smoker    Packs/day: 0.50    Years: 43.00    Pack years: 21.50    Types: Cigarettes  . Smokeless tobacco: Never Used  . Tobacco comment: heavy 2nd hand exposue ongoing  Substance and Sexual Activity  . Alcohol use: No  Alcohol/week: 0.0 oz    Comment: former heavy user quit 2011  . Drug use: No  . Sexual activity: Not on file

## 2017-11-30 ENCOUNTER — Ambulatory Visit: Payer: Medicare HMO | Admitting: Cardiovascular Disease

## 2017-11-30 ENCOUNTER — Encounter: Payer: Self-pay | Admitting: Cardiovascular Disease

## 2017-11-30 VITALS — BP 138/76 | HR 71 | Ht 70.0 in | Wt 186.8 lb

## 2017-11-30 DIAGNOSIS — I2510 Atherosclerotic heart disease of native coronary artery without angina pectoris: Secondary | ICD-10-CM

## 2017-11-30 DIAGNOSIS — E785 Hyperlipidemia, unspecified: Secondary | ICD-10-CM | POA: Diagnosis not present

## 2017-11-30 MED ORDER — ICOSAPENT ETHYL 1 G PO CAPS: 2.0000 g | ORAL_CAPSULE | Freq: Every day | ORAL | 3 refills | Status: DC

## 2017-11-30 NOTE — Patient Instructions (Signed)
Medication Instructions:  Start taking Vescepa 2g a day by mouth, take with food  Labwork: 8 weeks: LFT and Lipids  Testing/Procedures: None  Follow-Up: Your physician wants you to follow-up in: 1 year with Dr. Burt Knack. You will receive a reminder letter in the mail two months in advance. If you don't receive a letter, please call our office to schedule the follow-up appointment.   If you need a refill on your cardiac medications before your next appointment, please call your pharmacy.

## 2017-11-30 NOTE — Progress Notes (Signed)
Cardiology Office Note Date:  12/02/2017   ID:  TESHAWN MOAN, DOB [birth year], MRN [id number]  PCP:  Josetta Huddle, MD  Cardiologist:  Sherren Mocha, MD    Chief Complaint  Patient presents with  . Coronary Artery Disease     History of Present Illness: [name] is a 62 y.o. male who presents for follow-up of coronary artery disease.  He initially presented with a non-STEMI in 2009 treated medically as an initial approach.  He ultimately underwent cardiac catheterization 2 years later demonstrating total occlusion of the left circumflex with collaterals.  Medical therapy was recommended at that time.  The patient also has lower extremity peripheral arterial disease with a history of left femoral-popliteal bypass in 2016 for critical limb ischemia.  The patient is here alone today.  He denies any symptoms of chest pain.  No shortness of breath or legs.  He continues to have some weakness in his legs but feels he is getting a little stronger.  Relates this to his vascular problems.  Left toe amputations is seen.  He is followed regularly by vascular surgery.  No stroke or TIA symptoms.  No orthopnea, PND, lightheadedness, syncope.  Past Medical History:  Diagnosis Date  . Acute osteomyelitis of left foot ([place]) 03/16/2015  . Asthma    bronchitis to asthma many years  . CAD, NATIVE VESSEL 04/16/2008   Qualifier: Diagnosis of  By: Aundra Dubin, MD, [place]    . Cancer ([place])    non-hodgkins lymphoma  2000  recd radiation  . Cellulitis and abscess of leg, except foot 02/21/2015  . Colon cancer screening 01/30/2015  . Diabetic ulcer of left foot with necrosis of muscle ([place]) 02/21/2015  . Dyslipidemia associated with type 2 diabetes mellitus ([place]) 03/16/2015  . Encounter for long-term (current) use of other medications 10/16/2011  . Essential hypertension 04/16/2008   Qualifier: Diagnosis of  By: Aundra Dubin, MD, [place]    . Hyperlipidemia with target LDL less than 100 04/16/2008       .  Hypertriglyceridemia 03/26/2014  . Left carotid bruit 12/11/2014  . Myocardial infarction ([place])    2009  . Osteoarthritis of right hip 04/09/2012  . PAD (peripheral artery disease) ([place]) 03/26/2014  . Peripheral vascular disease, unspecified ([place]) 11/17/2011  . Septic arthritis of left foot ([place]) 03/16/2015  . Type 2 diabetes mellitus with vascular disease ([place]) 06/22/2009   Qualifier: Diagnosis of  By: Karrie Meres RN, BSN, Anne    . Uncontrolled diabetes mellitus type 2 with peripheral artery disease ([place]) 03/16/2015    Past Surgical History:  Procedure Laterality Date  . AMPUTATION Left 10/09/2017   Procedure: LEFT 5TH RAY AMPUTATION;  Surgeon: Newt Minion, MD;  Location: [place] [facility];  Service: Orthopedics;  Laterality: Left;  . CARDIAC CATHETERIZATION     2011  . FEMORAL-TIBIAL BYPASS GRAFT Left 05/04/2015   Procedure: LEFT  FEMORAL-BELOW THE KNEE POPLITEAL  ARTERY BYPASS GRAFT USING NON REVERSE LEFT GREATER SAPHENOUS VEIN;  Surgeon: Serafina Mitchell, MD;  Location: [facility];  Service: Vascular;  Laterality: Left;  . INTRAOPERATIVE ARTERIOGRAM Left 05/04/2015   Procedure: INTRA OPERATIVE ARTERIOGRAM TIMES TWO;  Surgeon: Serafina Mitchell, MD;  Location: [facility];  Service: Vascular;  Laterality: Left;  . LEG SURGERY     on bone above ankle-not sure which bone left leg 1981  . LEG SURGERY  1981   Bone cyst- Left leg  . MULTIPLE EXTRACTIONS WITH ALVEOLOPLASTY  10/31/2011   Procedure: MULTIPLE EXTRACION WITH  ALVEOLOPLASTY;  Surgeon: Lenn Cal, DDS;  Location: [facility];  Service: Oral Surgery;  Laterality: N/A;  Extraction of tooth #'s 2, 4,5,6,7,8,9,10,11,12,13,15,21,22,23,24,25,26,27,28, 29, and 31 with alveoloplasty  . PERIPHERAL VASCULAR CATHETERIZATION N/A 04/05/2015   Procedure: Abdominal Aortogram w/Lower Extremity;  Surgeon: Conrad Toronto, MD;  Location: [facility];  Service: Cardiovascular;  Laterality: N/A;  . PR VEIN BYPASS GRAFT,AORTO-FEM-POP    . TONSILLECTOMY      Current  Outpatient Medications  Medication Sig Dispense Refill  . aspirin 81 MG tablet Take 81 mg by mouth daily.    . clopidogrel (PLAVIX) 75 MG tablet Take 1 tablet (75 mg total) by mouth daily. Please keep upcoming appt for future refills. Thank you 90 tablet 0  . glimepiride (AMARYL) 1 MG tablet Take 1 mg by mouth daily with breakfast.    . glucose blood (COOL BLOOD GLUCOSE TEST STRIPS) test strip Use to check blood sugar twice a day  DX. E11.51 100 each 12  . Insulin Glargine (TOUJEO SOLOSTAR) 300 UNIT/ML SOPN Inject 35 Units into the skin daily.     . Insulin Pen Needle (NOVOFINE) 32G X 6 MM MISC 1 Act by Does not apply route daily. Use 1 QD 100 each 3  . isosorbide mononitrate (IMDUR) 30 MG 24 hr tablet Take 1 tablet (30 mg total) by mouth 2 (two) times daily. Please call for office visit [phone] 180 tablet 3  . lisinopril (PRINIVIL,ZESTRIL) 40 MG tablet Take 1 tablet (40 mg total) by mouth daily. Please keep upcoming appt for future refills. Thank you 90 tablet 0  . metFORMIN (GLUCOPHAGE) 1000 MG tablet Take 1,000 mg by mouth 2 (two) times daily with a meal.    . metoprolol tartrate (LOPRESSOR) 50 MG tablet Take 1.5 tablets (75 mg total) by mouth 2 (two) times daily. Please call for office visit [phone] 270 tablet 1  . naproxen (NAPROSYN) 375 MG tablet Take 375 mg by mouth daily as needed (arthritis pain).     . pravastatin (PRAVACHOL) 80 MG tablet Take 1 tablet (80 mg total) by mouth daily. Please keep upcoming appt for future refills. Thank you 90 tablet 0  . TRUEPLUS LANCETS 28G MISC Use to test blood sugar twice a day. DX: E11.51 200 each 1  . cilostazol (PLETAL) 100 MG tablet Take 1 tablet (100 mg total) by mouth 2 (two) times daily. Please make yearly appt with Dr. Burt Knack for April. 1st attempt (Patient not taking: Reported on 11/30/2017) 180 tablet 0  . HYDROcodone-acetaminophen (NORCO/VICODIN) 5-325 MG tablet Take 1-2 tablets by mouth every 4 (four) hours as needed for moderate pain.  (Patient not taking: Reported on 11/30/2017) 30 tablet 0  . Icosapent Ethyl (VASCEPA) 1 g CAPS Take 2 capsules (2 g total) by mouth daily. 180 capsule 3  . rifampin (RIFADIN) 300 MG capsule Take 300 mg by mouth 2 (two) times daily.  0  . sulfamethoxazole-trimethoprim (BACTRIM DS,SEPTRA DS) 800-160 MG tablet Take 1 tablet by mouth 2 (two) times daily.  0   No current facility-administered medications for this visit.     Allergies:   Fenofibrate; Pioglitazone; Chlorhexidine gluconate; Orange oil; and Soap   Social History:  The patient  reports that he has been smoking cigarettes.  He has a 21.50 pack-year smoking history. He has never used smokeless tobacco. He reports that he does not drink alcohol or use drugs.   Family History:  The patient's family history includes Heart attack in his mother and unknown  relative; Heart disease in his mother; Hyperlipidemia in his father and mother; Hypertension in his father and mother; Peripheral Artery Disease in his father; Peripheral vascular disease in his father.   ROS:  Please see the history of present illness.  All other systems are reviewed and negative.   PHYSICAL EXAM: VS:  BP 138/76   Pulse 71   Ht 5\' 10"  (1.778 m)   Wt 186 lb 12.8 oz (84.7 kg)   SpO2 95%   BMI 26.80 kg/m  , BMI Body mass index is 26.8 kg/m. GEN: Well nourished, well developed, in no acute distress  HEENT: normal  Neck: no JVD, no masses. No carotid bruits Cardiac: RRR with a 1/6 systolic murmur at the RUSB               Respiratory:  clear to auscultation bilaterally, normal work of breathing GI: soft, nontender, nondistended, + BS MS: no deformity or atrophy  Ext: no pretibial edema Skin: warm and dry, no rash Neuro:  Strength and sensation are intact Psych: euthymic mood, full affect  EKG:  EKG is not ordered today.  Recent Labs: 10/09/2017: BUN 19; Creatinine, Ser 1.25; Hemoglobin 15.1; Platelets 243; Potassium 5.2; Sodium 136   Lipid Panel     Component  Value Date/Time   CHOL 196 01/30/2016 1654   TRIG 560 (H) 01/30/2016 1654   HDL 28 (L) 01/30/2016 1654   CHOLHDL 7.0 (H) 01/30/2016 1654   VLDL NOT CALC 01/30/2016 1654   LDLCALC NOT CALC 01/30/2016 1654   LDLDIRECT 71.0 01/30/2015 1018      Wt Readings from Last 3 Encounters:  11/30/17 186 lb 12.8 oz (84.7 kg)  10/22/17 189 lb (85.7 kg)  10/09/17 189 lb (85.7 kg)     Cardiac Studies Reviewed: 2D echocardiogram 09/08/2016: Study Conclusions  - Left ventricle: The cavity size was normal. Wall thickness was   increased in a pattern of mild LVH. There was mild focal basal   hypertrophy of the septum. Systolic function was mildly to   moderately reduced. The estimated ejection fraction was in the   range of 40% to 45%. There is hypokinesis of the mid-apical   septal and apical myocardium. There is hypokinesis of the   inferolateral myocardium. Doppler parameters are consistent with   abnormal left ventricular relaxation (grade 1 diastolic   dysfunction). Doppler parameters are consistent with high   ventricular filling pressure. - Left atrium: The atrium was mildly dilated.  Impressions:  - Hypokinesis of the inferolateral wall, distal septum and apex   with mild to moderate LV dysfunction; grade 1 diastolic   dysfunction with elevated LV filling pressure; mild LVH; mild   LAE.  ASSESSMENT AND PLAN: 1.  Coronary artery disease, native vessel, without angina: Medical program is reviewed and will be continued with aspirin, clopidogrel lisinopril, metoprolol, and isosorbide.  2.  Peripheral arterial disease with claudication: Patient is followed by vascular surgery.  3.  Mixed hyperlipidemia: Reviewed recent labs with a cholesterol of 210, HDL 28, LDL 86, triglycerides 783.  Triglyceride levels are lower than in the past.  Reviewed history of drug intolerances.  He has difficulty with lipid lowering medicines.  I recommended a trial of the Vascepa 2 g twice daily.  Repeat  lipids and LFTs in 8 weeks.  4.  Type 2 diabetes: managed by his PCP. Medical program reviewed. Lifestyle modification discussed.   5. Tobacco abuse: continues to smoke. Not interested in quitting at this point.   Current  medicines are reviewed with the patient today.  The patient does not have concerns regarding medicines.  Labs/ tests ordered today include:   Orders Placed This Encounter  Procedures  . Hepatic function panel  . Lipid Profile    Disposition:   FU one year  Signed, Sherren Mocha, MD  12/02/2017 8:24 AM    [facility] [address], [place], [place]  [address] Phone: [phone]; Fax: [phone]

## 2017-12-01 ENCOUNTER — Encounter: Payer: Self-pay | Admitting: Cardiovascular Disease

## 2017-12-02 ENCOUNTER — Other Ambulatory Visit: Payer: Self-pay

## 2017-12-02 ENCOUNTER — Encounter: Payer: Self-pay | Admitting: Cardiovascular Disease

## 2017-12-02 MED ORDER — ICOSAPENT ETHYL 1 G PO CAPS: 2.0000 g | ORAL_CAPSULE | Freq: Every day | ORAL | 3 refills | Status: DC

## 2017-12-14 ENCOUNTER — Ambulatory Visit (INDEPENDENT_AMBULATORY_CARE_PROVIDER_SITE_OTHER): Payer: Medicare HMO | Admitting: Orthopedic Surgery

## 2017-12-17 ENCOUNTER — Other Ambulatory Visit: Payer: Self-pay | Admitting: Cardiology

## 2017-12-17 ENCOUNTER — Other Ambulatory Visit: Payer: Self-pay | Admitting: Cardiovascular Disease

## 2018-01-28 DIAGNOSIS — E119 Type 2 diabetes mellitus without complications: Secondary | ICD-10-CM | POA: Diagnosis not present

## 2018-02-01 ENCOUNTER — Other Ambulatory Visit: Payer: Self-pay

## 2018-02-03 ENCOUNTER — Other Ambulatory Visit: Payer: Self-pay | Admitting: Cardiovascular Disease

## 2018-02-10 ENCOUNTER — Ambulatory Visit: Payer: Self-pay | Admitting: Cardiovascular Disease

## 2018-02-22 DIAGNOSIS — Z23 Encounter for immunization: Secondary | ICD-10-CM | POA: Diagnosis not present

## 2018-02-22 DIAGNOSIS — I10 Essential (primary) hypertension: Secondary | ICD-10-CM | POA: Diagnosis not present

## 2018-02-22 DIAGNOSIS — I739 Peripheral vascular disease, unspecified: Secondary | ICD-10-CM | POA: Diagnosis not present

## 2018-02-22 DIAGNOSIS — Z720 Tobacco use: Secondary | ICD-10-CM | POA: Diagnosis not present

## 2018-02-22 DIAGNOSIS — E1140 Type 2 diabetes mellitus with diabetic neuropathy, unspecified: Secondary | ICD-10-CM | POA: Diagnosis not present

## 2018-02-22 DIAGNOSIS — E785 Hyperlipidemia, unspecified: Secondary | ICD-10-CM | POA: Diagnosis not present

## 2018-02-22 DIAGNOSIS — I2510 Atherosclerotic heart disease of native coronary artery without angina pectoris: Secondary | ICD-10-CM | POA: Diagnosis not present

## 2018-02-22 DIAGNOSIS — E1169 Type 2 diabetes mellitus with other specified complication: Secondary | ICD-10-CM | POA: Diagnosis not present

## 2018-05-19 ENCOUNTER — Other Ambulatory Visit: Payer: Self-pay | Admitting: Cardiology

## 2018-05-28 ENCOUNTER — Other Ambulatory Visit: Payer: Self-pay | Admitting: Cardiology

## 2018-06-04 MED ORDER — METOPROLOL TARTRATE 50 MG PO TABS: 75.0000 mg | ORAL_TABLET | Freq: Two times a day (BID) | ORAL | 3 refills | Status: AC

## 2018-06-27 ENCOUNTER — Other Ambulatory Visit: Payer: Self-pay | Admitting: Cardiovascular Disease

## 2018-08-11 ENCOUNTER — Other Ambulatory Visit: Payer: Self-pay | Admitting: Cardiology

## 2018-08-12 ENCOUNTER — Other Ambulatory Visit: Payer: Self-pay

## 2018-08-16 ENCOUNTER — Other Ambulatory Visit: Payer: Self-pay | Admitting: Cardiovascular Disease

## 2018-08-16 MED ORDER — ISOSORBIDE MONONITRATE ER 30 MG PO TB24: 30.0000 mg | ORAL_TABLET | Freq: Two times a day (BID) | ORAL | 3 refills | Status: AC

## 2018-09-02 DIAGNOSIS — E1140 Type 2 diabetes mellitus with diabetic neuropathy, unspecified: Secondary | ICD-10-CM | POA: Diagnosis not present

## 2018-09-02 DIAGNOSIS — E1151 Type 2 diabetes mellitus with diabetic peripheral angiopathy without gangrene: Secondary | ICD-10-CM | POA: Diagnosis not present

## 2018-09-02 DIAGNOSIS — Z1389 Encounter for screening for other disorder: Secondary | ICD-10-CM | POA: Diagnosis not present

## 2018-09-02 DIAGNOSIS — S98132A Complete traumatic amputation of one left lesser toe, initial encounter: Secondary | ICD-10-CM | POA: Diagnosis not present

## 2018-09-02 DIAGNOSIS — Z0001 Encounter for general adult medical examination with abnormal findings: Secondary | ICD-10-CM | POA: Diagnosis not present

## 2018-09-02 DIAGNOSIS — Z23 Encounter for immunization: Secondary | ICD-10-CM | POA: Diagnosis not present

## 2018-09-02 DIAGNOSIS — Z720 Tobacco use: Secondary | ICD-10-CM | POA: Diagnosis not present

## 2018-09-02 DIAGNOSIS — E1169 Type 2 diabetes mellitus with other specified complication: Secondary | ICD-10-CM | POA: Diagnosis not present

## 2018-09-02 DIAGNOSIS — I739 Peripheral vascular disease, unspecified: Secondary | ICD-10-CM | POA: Diagnosis not present

## 2018-09-02 DIAGNOSIS — I10 Essential (primary) hypertension: Secondary | ICD-10-CM | POA: Diagnosis not present

## 2018-09-21 DIAGNOSIS — E1169 Type 2 diabetes mellitus with other specified complication: Secondary | ICD-10-CM | POA: Diagnosis not present

## 2018-09-21 DIAGNOSIS — I2510 Atherosclerotic heart disease of native coronary artery without angina pectoris: Secondary | ICD-10-CM | POA: Diagnosis not present

## 2018-09-21 DIAGNOSIS — Z125 Encounter for screening for malignant neoplasm of prostate: Secondary | ICD-10-CM | POA: Diagnosis not present

## 2018-09-21 DIAGNOSIS — E782 Mixed hyperlipidemia: Secondary | ICD-10-CM | POA: Diagnosis not present

## 2018-09-21 DIAGNOSIS — E1140 Type 2 diabetes mellitus with diabetic neuropathy, unspecified: Secondary | ICD-10-CM | POA: Diagnosis not present

## 2018-09-21 DIAGNOSIS — E785 Hyperlipidemia, unspecified: Secondary | ICD-10-CM | POA: Diagnosis not present

## 2018-09-21 DIAGNOSIS — Z794 Long term (current) use of insulin: Secondary | ICD-10-CM | POA: Diagnosis not present

## 2018-09-21 DIAGNOSIS — I10 Essential (primary) hypertension: Secondary | ICD-10-CM | POA: Diagnosis not present

## 2018-11-19 ENCOUNTER — Other Ambulatory Visit: Payer: Self-pay | Admitting: Cardiovascular Disease

## 2018-12-29 ENCOUNTER — Encounter: Payer: Self-pay | Admitting: Cardiovascular Disease

## 2018-12-29 ENCOUNTER — Ambulatory Visit (INDEPENDENT_AMBULATORY_CARE_PROVIDER_SITE_OTHER): Payer: Medicare HMO | Admitting: Cardiovascular Disease

## 2018-12-29 ENCOUNTER — Other Ambulatory Visit: Payer: Self-pay

## 2018-12-29 VITALS — BP 122/78 | HR 64 | Ht 70.0 in | Wt 192.4 lb

## 2018-12-29 DIAGNOSIS — I10 Essential (primary) hypertension: Secondary | ICD-10-CM

## 2018-12-29 DIAGNOSIS — I2510 Atherosclerotic heart disease of native coronary artery without angina pectoris: Secondary | ICD-10-CM

## 2018-12-29 DIAGNOSIS — E782 Mixed hyperlipidemia: Secondary | ICD-10-CM | POA: Diagnosis not present

## 2018-12-29 NOTE — Patient Instructions (Signed)

## 2018-12-29 NOTE — Progress Notes (Signed)
Cardiology Office Note:    Date:  12/29/2018   ID:  [name], DOB 1955-12-18, MRN [id number]  PCP:  Josetta Huddle, MD  Cardiologist:  Sherren Mocha, MD  Electrophysiologist:  None   Referring MD: Josetta Huddle, MD   Chief Complaint  Patient presents with  . Coronary Artery Disease    History of Present Illness:    [name] is a 63 y.o. male with a hx of coronary and peripheral arterial disease, presenting for follow-up evaluation.  He initially presented with a non-STEMI in 2009 treated medically as an initial approach.  He ultimately underwent cardiac catheterization 2 years later demonstrating total occlusion of the left circumflex with collaterals.  Medical therapy was recommended at that time.  The patient also has lower extremity peripheral arterial disease with a history of left femoral-popliteal bypass in 2016 for critical limb ischemia.  The patient is here alone today.  He is doing well with no chest pain or shortness of breath.  He has had no symptoms of leg claudication of late.  He reports good compliance with his medications.  He is had a longstanding history of marked hypertriglyceridemia and has had a lot of difficulty tolerating lipid-lowering medicines.  He most recently tried Vascepa but could not afford it even with the assistance program.  He is currently taking Krill oil.  He has no history of pancreatitis.  Past Medical History:  Diagnosis Date  . Acute osteomyelitis of left foot ([place]) 03/16/2015  . Asthma    bronchitis to asthma many years  . CAD, NATIVE VESSEL 04/16/2008   Qualifier: Diagnosis of  By: Aundra Dubin, MD, [place]    . Cancer ([place])    non-hodgkins lymphoma  2000  recd radiation  . Cellulitis and abscess of leg, except foot 02/21/2015  . Colon cancer screening 01/30/2015  . Diabetic ulcer of left foot with necrosis of muscle ([place]) 02/21/2015  . Dyslipidemia associated with type 2 diabetes mellitus ([place]) 03/16/2015  . Encounter for long-term  (current) use of other medications 10/16/2011  . Essential hypertension 04/16/2008   Qualifier: Diagnosis of  By: Aundra Dubin, MD, [place]    . Hyperlipidemia with target LDL less than 100 04/16/2008       . Hypertriglyceridemia 03/26/2014  . Left carotid bruit 12/11/2014  . Myocardial infarction ([place])    2009  . Osteoarthritis of right hip 04/09/2012  . PAD (peripheral artery disease) ([place]) 03/26/2014  . Peripheral vascular disease, unspecified ([place]) 11/17/2011  . Septic arthritis of left foot ([place]) 03/16/2015  . Type 2 diabetes mellitus with vascular disease ([place]) 06/22/2009   Qualifier: Diagnosis of  By: Karrie Meres RN, BSN, Anne    . Uncontrolled diabetes mellitus type 2 with peripheral artery disease ([place]) 03/16/2015    Past Surgical History:  Procedure Laterality Date  . AMPUTATION Left 10/09/2017   Procedure: LEFT 5TH RAY AMPUTATION;  Surgeon: Newt Minion, MD;  Location: [facility];  Service: Orthopedics;  Laterality: Left;  . CARDIAC CATHETERIZATION     2011  . FEMORAL-TIBIAL BYPASS GRAFT Left 05/04/2015   Procedure: LEFT  FEMORAL-BELOW THE KNEE POPLITEAL  ARTERY BYPASS GRAFT USING NON REVERSE LEFT GREATER SAPHENOUS VEIN;  Surgeon: Serafina Mitchell, MD;  Location: [facility];  Service: Vascular;  Laterality: Left;  . INTRAOPERATIVE ARTERIOGRAM Left 05/04/2015   Procedure: INTRA OPERATIVE ARTERIOGRAM TIMES TWO;  Surgeon: Serafina Mitchell, MD;  Location: [facility];  Service: Vascular;  Laterality: Left;  . LEG SURGERY     on  bone above ankle-not sure which bone left leg 1981  . LEG SURGERY  1981   Bone cyst- Left leg  . MULTIPLE EXTRACTIONS WITH ALVEOLOPLASTY  10/31/2011   Procedure: MULTIPLE EXTRACION WITH ALVEOLOPLASTY;  Surgeon: Lenn Cal, DDS;  Location: [facility];  Service: Oral Surgery;  Laterality: N/A;  Extraction of tooth #'s 2, 4,5,6,7,8,9,10,11,12,13,15,21,22,23,24,25,26,27,28, 29, and 31 with alveoloplasty  . PERIPHERAL VASCULAR CATHETERIZATION N/A 04/05/2015   Procedure: Abdominal  Aortogram w/Lower Extremity;  Surgeon: Conrad [place], MD;  Location: [facility];  Service: Cardiovascular;  Laterality: N/A;  . PR VEIN BYPASS GRAFT,AORTO-FEM-POP    . TONSILLECTOMY      Current Medications: Current Meds  Medication Sig  . aspirin 81 MG tablet Take 81 mg by mouth daily.  . cilostazol (PLETAL) 100 MG tablet Take 1 tablet (100 mg total) by mouth 2 (two) times daily. Please make yearly appt with Dr. Burt Knack for April. 1st attempt  . clopidogrel (PLAVIX) 75 MG tablet Take 1 tablet (75 mg total) by mouth daily.  Marland Kitchen glimepiride (AMARYL) 1 MG tablet Take 1 mg by mouth daily with breakfast.  . glucose blood (COOL BLOOD GLUCOSE TEST STRIPS) test strip Use to check blood sugar twice a day  DX. E11.51  . Insulin Glargine (TOUJEO SOLOSTAR) 300 UNIT/ML SOPN Inject 30 Units into the skin daily.   . Insulin Pen Needle (NOVOFINE) 32G X 6 MM MISC 1 Act by Does not apply route daily. Use 1 QD  . isosorbide mononitrate (IMDUR) 30 MG 24 hr tablet Take 1 tablet (30 mg total) by mouth 2 (two) times daily. Please make yearly appt with Dr. Burt Knack for July for future refills. 1st attempt  . KRILL OIL PO Take 2 capsules by mouth 2 (two) times daily.  Marland Kitchen lisinopril (ZESTRIL) 40 MG tablet Take 1 tablet (40 mg total) by mouth daily. Please keep upcoming appt for future refills. Thank you.  . metFORMIN (GLUCOPHAGE) 1000 MG tablet Take 1,000 mg by mouth 2 (two) times daily with a meal.  . metoprolol tartrate (LOPRESSOR) 50 MG tablet Take 1.5 tablets (75 mg total) by mouth 2 (two) times daily.  . naproxen (NAPROSYN) 375 MG tablet Take 375 mg by mouth daily as needed (arthritis pain).   . pravastatin (PRAVACHOL) 80 MG tablet Take 1 tablet (80 mg total) by mouth daily.  . TRUEPLUS LANCETS 28G MISC Use to test blood sugar twice a day. DX: E11.51     Allergies:   Fenofibrate, Pioglitazone, Chlorhexidine gluconate, Orange oil, and Soap   Social History   Socioeconomic History  . Marital status: Married     Spouse name: Not on file  . Number of children: Not on file  . Years of education: Not on file  . Highest education level: Not on file  Occupational History    Comment: Disabled. Former Biomedical scientist.  Social Needs  . Financial resource strain: Not on file  . Food insecurity    Worry: Not on file    Inability: Not on file  . Transportation needs    Medical: Not on file    Non-medical: Not on file  Tobacco Use  . Smoking status: Light Tobacco Smoker    Packs/day: 0.50    Years: 43.00    Pack years: 21.50    Types: Cigarettes  . Smokeless tobacco: Never Used  . Tobacco comment: heavy 2nd hand exposue ongoing  Substance and Sexual Activity  . Alcohol use: No    Alcohol/week: 0.0 standard drinks  Comment: former heavy user quit 2011  . Drug use: No  . Sexual activity: Not on file  Lifestyle  . Physical activity    Days per week: Not on file    Minutes per session: Not on file  . Stress: Not on file  Relationships  . Social Herbalist on phone: Not on file    Gets together: Not on file    Attends religious service: Not on file    Active member of club or organization: Not on file    Attends meetings of clubs or organizations: Not on file    Relationship status: Not on file  Other Topics Concern  . Not on file  Social History Narrative   The patient is married. The patient has no children.   Patient with a history of smoking one pack per day for 43 years. Patient started smoking at the age of 76.   Patient no longer drinks alcohol. Patient quit proximally one and a half years ago from a previously heavy alcohol use.     Family History: The patient's family history includes Heart attack in his mother and unknown relative; Heart disease in his mother; Hyperlipidemia in his father and mother; Hypertension in his father and mother; Peripheral Artery Disease in his father; Peripheral vascular disease in his father.  ROS:   Please see the history of present illness.     All other systems reviewed and are negative.  EKGs/Labs/Other Studies Reviewed:    The following studies were reviewed today: 2D echocardiogram 09/08/2016: Study Conclusions  - Left ventricle: The cavity size was normal. Wall thickness was   increased in a pattern of mild LVH. There was mild focal basal   hypertrophy of the septum. Systolic function was mildly to   moderately reduced. The estimated ejection fraction was in the   range of 40% to 45%. There is hypokinesis of the mid-apical   septal and apical myocardium. There is hypokinesis of the   inferolateral myocardium. Doppler parameters are consistent with   abnormal left ventricular relaxation (grade 1 diastolic   dysfunction). Doppler parameters are consistent with high   ventricular filling pressure. - Left atrium: The atrium was mildly dilated.  Impressions:  - Hypokinesis of the inferolateral wall, distal septum and apex   with mild to moderate LV dysfunction; grade 1 diastolic   dysfunction with elevated LV filling pressure; mild LVH; mild   LAE.  EKG:  EKG is ordered today.  The ekg ordered today demonstrates normal sinus rhythm 64 bpm, within normal limits.  Recent Labs: No results found for requested labs within last 8760 hours.  Recent Lipid Panel    Component Value Date/Time   CHOL 196 01/30/2016 1654   TRIG 560 (H) 01/30/2016 1654   HDL 28 (L) 01/30/2016 1654   CHOLHDL 7.0 (H) 01/30/2016 1654   VLDL NOT CALC 01/30/2016 1654   LDLCALC NOT CALC 01/30/2016 1654   LDLDIRECT 71.0 01/30/2015 1018    Physical Exam:    VS:  BP 122/78   Pulse 64   Ht 5\' 10"  (1.778 m)   Wt 192 lb 6.4 oz (87.3 kg)   SpO2 96%   BMI 27.61 kg/m     Wt Readings from Last 3 Encounters:  12/29/18 192 lb 6.4 oz (87.3 kg)  11/30/17 186 lb 12.8 oz (84.7 kg)  10/22/17 189 lb (85.7 kg)     GEN:  Well nourished, well developed in no acute distress HEENT: Normal NECK:  No JVD; No carotid bruits LYMPHATICS: No lymphadenopathy  CARDIAC: RRR, 2/6 systolic ejection murmur at the right upper sternal border RESPIRATORY:  Clear to auscultation without rales, wheezing or rhonchi  ABDOMEN: Soft, non-tender, non-distended MUSCULOSKELETAL:  No edema; No deformity  SKIN: Warm and dry NEUROLOGIC:  Alert and oriented x 3 PSYCHIATRIC:  Normal affect   ASSESSMENT:    1. Atherosclerosis of native coronary artery of native heart without angina pectoris   2. Mixed hyperlipidemia   3. Essential hypertension    PLAN:    In order of problems listed above:  1. The patient is stable with no symptoms of angina.  His medications are reviewed and will be continued without change.  He is counseled regarding tobacco cessation and he is not inclined to quit. 2. Treated with pravastatin due to multiple drug intolerances.  He will continue with Krill oil. 3. Blood pressure is controlled with isosorbide, lisinopril, and metoprolol.  Follows periodically with vascular surgery for his PAD.  He has no claudication symptoms at present.  His medical program is acceptable.  He is not interested in smoking cessation and understands the adverse impact on his health.   Medication Adjustments/Labs and Tests Ordered: Current medicines are reviewed at length with the patient today.  Concerns regarding medicines are outlined above.  Orders Placed This Encounter  Procedures  . EKG 12-Lead   No orders of the defined types were placed in this encounter.   Patient Instructions  Medication Instructions:  Your provider recommends that you continue on your current medications as directed. Please refer to the Current Medication list given to you today.    Labwork: None  Testing/Procedures: None  Follow-Up: Your provider wants you to follow-up in: 1 year with Dr. Burt Knack. You will receive a reminder letter in the mail two months in advance. If you don't receive a letter, please call our office to schedule the follow-up appointment.    Any Other  Special Instructions Will Be Listed Below (If Applicable).     If you need a refill on your cardiac medications before your next appointment, please call your pharmacy.      Signed, Sherren Mocha, MD  12/29/2018 1:46 PM    [facility]

## 2019-02-09 ENCOUNTER — Other Ambulatory Visit: Payer: Self-pay | Admitting: Cardiovascular Disease

## 2019-02-16 DIAGNOSIS — Z23 Encounter for immunization: Secondary | ICD-10-CM | POA: Diagnosis not present

## 2019-03-01 DIAGNOSIS — E1151 Type 2 diabetes mellitus with diabetic peripheral angiopathy without gangrene: Secondary | ICD-10-CM | POA: Diagnosis not present

## 2019-03-01 DIAGNOSIS — I739 Peripheral vascular disease, unspecified: Secondary | ICD-10-CM | POA: Diagnosis not present

## 2019-03-01 DIAGNOSIS — E785 Hyperlipidemia, unspecified: Secondary | ICD-10-CM | POA: Diagnosis not present

## 2019-03-01 DIAGNOSIS — I10 Essential (primary) hypertension: Secondary | ICD-10-CM | POA: Diagnosis not present

## 2019-03-01 DIAGNOSIS — Z89422 Acquired absence of other left toe(s): Secondary | ICD-10-CM | POA: Diagnosis not present

## 2019-03-01 DIAGNOSIS — Z23 Encounter for immunization: Secondary | ICD-10-CM | POA: Diagnosis not present

## 2019-03-01 DIAGNOSIS — B351 Tinea unguium: Secondary | ICD-10-CM | POA: Diagnosis not present

## 2019-03-01 DIAGNOSIS — I2510 Atherosclerotic heart disease of native coronary artery without angina pectoris: Secondary | ICD-10-CM | POA: Diagnosis not present

## 2019-03-01 DIAGNOSIS — Z720 Tobacco use: Secondary | ICD-10-CM | POA: Diagnosis not present

## 2019-03-01 DIAGNOSIS — E1140 Type 2 diabetes mellitus with diabetic neuropathy, unspecified: Secondary | ICD-10-CM | POA: Diagnosis not present

## 2019-03-01 DIAGNOSIS — S98132A Complete traumatic amputation of one left lesser toe, initial encounter: Secondary | ICD-10-CM | POA: Diagnosis not present

## 2019-03-03 DIAGNOSIS — E1140 Type 2 diabetes mellitus with diabetic neuropathy, unspecified: Secondary | ICD-10-CM | POA: Diagnosis not present

## 2019-03-03 DIAGNOSIS — I2510 Atherosclerotic heart disease of native coronary artery without angina pectoris: Secondary | ICD-10-CM | POA: Diagnosis not present

## 2019-03-03 DIAGNOSIS — E782 Mixed hyperlipidemia: Secondary | ICD-10-CM | POA: Diagnosis not present

## 2019-03-03 DIAGNOSIS — I252 Old myocardial infarction: Secondary | ICD-10-CM | POA: Diagnosis not present

## 2019-03-03 DIAGNOSIS — E785 Hyperlipidemia, unspecified: Secondary | ICD-10-CM | POA: Diagnosis not present

## 2019-03-03 DIAGNOSIS — E781 Pure hyperglyceridemia: Secondary | ICD-10-CM | POA: Diagnosis not present

## 2019-03-03 DIAGNOSIS — I10 Essential (primary) hypertension: Secondary | ICD-10-CM | POA: Diagnosis not present

## 2019-03-03 DIAGNOSIS — E1169 Type 2 diabetes mellitus with other specified complication: Secondary | ICD-10-CM | POA: Diagnosis not present

## 2019-03-08 DIAGNOSIS — M1990 Unspecified osteoarthritis, unspecified site: Secondary | ICD-10-CM | POA: Diagnosis not present

## 2019-03-08 DIAGNOSIS — M5136 Other intervertebral disc degeneration, lumbar region: Secondary | ICD-10-CM | POA: Diagnosis not present

## 2019-03-08 DIAGNOSIS — M19012 Primary osteoarthritis, left shoulder: Secondary | ICD-10-CM | POA: Diagnosis not present

## 2019-03-08 DIAGNOSIS — M533 Sacrococcygeal disorders, not elsewhere classified: Secondary | ICD-10-CM | POA: Diagnosis not present

## 2019-03-08 DIAGNOSIS — M79642 Pain in left hand: Secondary | ICD-10-CM | POA: Diagnosis not present

## 2019-03-08 DIAGNOSIS — M25519 Pain in unspecified shoulder: Secondary | ICD-10-CM | POA: Diagnosis not present

## 2019-03-08 DIAGNOSIS — M25512 Pain in left shoulder: Secondary | ICD-10-CM | POA: Diagnosis not present

## 2019-03-08 DIAGNOSIS — M19011 Primary osteoarthritis, right shoulder: Secondary | ICD-10-CM | POA: Diagnosis not present

## 2019-03-08 DIAGNOSIS — E1169 Type 2 diabetes mellitus with other specified complication: Secondary | ICD-10-CM | POA: Diagnosis not present

## 2019-03-08 DIAGNOSIS — Z872 Personal history of diseases of the skin and subcutaneous tissue: Secondary | ICD-10-CM | POA: Diagnosis not present

## 2019-03-08 DIAGNOSIS — M2550 Pain in unspecified joint: Secondary | ICD-10-CM | POA: Diagnosis not present

## 2019-03-08 DIAGNOSIS — M79641 Pain in right hand: Secondary | ICD-10-CM | POA: Diagnosis not present

## 2019-03-08 DIAGNOSIS — M79672 Pain in left foot: Secondary | ICD-10-CM | POA: Diagnosis not present

## 2019-03-08 DIAGNOSIS — M25511 Pain in right shoulder: Secondary | ICD-10-CM | POA: Diagnosis not present

## 2019-03-08 DIAGNOSIS — M549 Dorsalgia, unspecified: Secondary | ICD-10-CM | POA: Diagnosis not present

## 2019-03-08 DIAGNOSIS — M25559 Pain in unspecified hip: Secondary | ICD-10-CM | POA: Diagnosis not present

## 2019-03-08 DIAGNOSIS — M2560 Stiffness of unspecified joint, not elsewhere classified: Secondary | ICD-10-CM | POA: Diagnosis not present

## 2019-03-08 DIAGNOSIS — M79671 Pain in right foot: Secondary | ICD-10-CM | POA: Diagnosis not present

## 2019-03-08 DIAGNOSIS — M79643 Pain in unspecified hand: Secondary | ICD-10-CM | POA: Diagnosis not present

## 2019-04-22 DIAGNOSIS — M25519 Pain in unspecified shoulder: Secondary | ICD-10-CM | POA: Diagnosis not present

## 2019-04-22 DIAGNOSIS — M25559 Pain in unspecified hip: Secondary | ICD-10-CM | POA: Diagnosis not present

## 2019-04-22 DIAGNOSIS — Z872 Personal history of diseases of the skin and subcutaneous tissue: Secondary | ICD-10-CM | POA: Diagnosis not present

## 2019-04-22 DIAGNOSIS — E1169 Type 2 diabetes mellitus with other specified complication: Secondary | ICD-10-CM | POA: Diagnosis not present

## 2019-04-22 DIAGNOSIS — M2550 Pain in unspecified joint: Secondary | ICD-10-CM | POA: Diagnosis not present

## 2019-04-22 DIAGNOSIS — M79643 Pain in unspecified hand: Secondary | ICD-10-CM | POA: Diagnosis not present

## 2019-04-22 DIAGNOSIS — M549 Dorsalgia, unspecified: Secondary | ICD-10-CM | POA: Diagnosis not present

## 2019-04-22 DIAGNOSIS — M1990 Unspecified osteoarthritis, unspecified site: Secondary | ICD-10-CM | POA: Diagnosis not present

## 2019-04-22 DIAGNOSIS — M2560 Stiffness of unspecified joint, not elsewhere classified: Secondary | ICD-10-CM | POA: Diagnosis not present

## 2019-05-13 DIAGNOSIS — M160 Bilateral primary osteoarthritis of hip: Secondary | ICD-10-CM | POA: Diagnosis not present

## 2019-05-24 DIAGNOSIS — I10 Essential (primary) hypertension: Secondary | ICD-10-CM | POA: Diagnosis not present

## 2019-05-24 DIAGNOSIS — E785 Hyperlipidemia, unspecified: Secondary | ICD-10-CM | POA: Diagnosis not present

## 2019-05-24 DIAGNOSIS — E1169 Type 2 diabetes mellitus with other specified complication: Secondary | ICD-10-CM | POA: Diagnosis not present

## 2019-05-24 DIAGNOSIS — E1140 Type 2 diabetes mellitus with diabetic neuropathy, unspecified: Secondary | ICD-10-CM | POA: Diagnosis not present

## 2019-05-24 DIAGNOSIS — E781 Pure hyperglyceridemia: Secondary | ICD-10-CM | POA: Diagnosis not present

## 2019-05-24 DIAGNOSIS — I252 Old myocardial infarction: Secondary | ICD-10-CM | POA: Diagnosis not present

## 2019-05-24 DIAGNOSIS — I2510 Atherosclerotic heart disease of native coronary artery without angina pectoris: Secondary | ICD-10-CM | POA: Diagnosis not present

## 2019-05-24 DIAGNOSIS — E782 Mixed hyperlipidemia: Secondary | ICD-10-CM | POA: Diagnosis not present

## 2019-06-24 DIAGNOSIS — I252 Old myocardial infarction: Secondary | ICD-10-CM | POA: Diagnosis not present

## 2019-06-24 DIAGNOSIS — I10 Essential (primary) hypertension: Secondary | ICD-10-CM | POA: Diagnosis not present

## 2019-06-24 DIAGNOSIS — E1169 Type 2 diabetes mellitus with other specified complication: Secondary | ICD-10-CM | POA: Diagnosis not present

## 2019-06-24 DIAGNOSIS — E781 Pure hyperglyceridemia: Secondary | ICD-10-CM | POA: Diagnosis not present

## 2019-06-24 DIAGNOSIS — E782 Mixed hyperlipidemia: Secondary | ICD-10-CM | POA: Diagnosis not present

## 2019-06-24 DIAGNOSIS — E785 Hyperlipidemia, unspecified: Secondary | ICD-10-CM | POA: Diagnosis not present

## 2019-06-24 DIAGNOSIS — E1140 Type 2 diabetes mellitus with diabetic neuropathy, unspecified: Secondary | ICD-10-CM | POA: Diagnosis not present

## 2019-06-24 DIAGNOSIS — I2510 Atherosclerotic heart disease of native coronary artery without angina pectoris: Secondary | ICD-10-CM | POA: Diagnosis not present

## 2019-07-27 DIAGNOSIS — E1169 Type 2 diabetes mellitus with other specified complication: Secondary | ICD-10-CM | POA: Diagnosis not present

## 2019-07-27 DIAGNOSIS — E782 Mixed hyperlipidemia: Secondary | ICD-10-CM | POA: Diagnosis not present

## 2019-07-27 DIAGNOSIS — I10 Essential (primary) hypertension: Secondary | ICD-10-CM | POA: Diagnosis not present

## 2019-07-27 DIAGNOSIS — I252 Old myocardial infarction: Secondary | ICD-10-CM | POA: Diagnosis not present

## 2019-07-27 DIAGNOSIS — E1140 Type 2 diabetes mellitus with diabetic neuropathy, unspecified: Secondary | ICD-10-CM | POA: Diagnosis not present

## 2019-07-27 DIAGNOSIS — E781 Pure hyperglyceridemia: Secondary | ICD-10-CM | POA: Diagnosis not present

## 2019-07-27 DIAGNOSIS — E785 Hyperlipidemia, unspecified: Secondary | ICD-10-CM | POA: Diagnosis not present

## 2019-07-27 DIAGNOSIS — I2510 Atherosclerotic heart disease of native coronary artery without angina pectoris: Secondary | ICD-10-CM | POA: Diagnosis not present

## 2019-09-01 DIAGNOSIS — E1169 Type 2 diabetes mellitus with other specified complication: Secondary | ICD-10-CM | POA: Diagnosis not present

## 2019-09-01 DIAGNOSIS — E785 Hyperlipidemia, unspecified: Secondary | ICD-10-CM | POA: Diagnosis not present

## 2019-09-01 DIAGNOSIS — I2510 Atherosclerotic heart disease of native coronary artery without angina pectoris: Secondary | ICD-10-CM | POA: Diagnosis not present

## 2019-09-01 DIAGNOSIS — E781 Pure hyperglyceridemia: Secondary | ICD-10-CM | POA: Diagnosis not present

## 2019-09-01 DIAGNOSIS — E782 Mixed hyperlipidemia: Secondary | ICD-10-CM | POA: Diagnosis not present

## 2019-09-01 DIAGNOSIS — I252 Old myocardial infarction: Secondary | ICD-10-CM | POA: Diagnosis not present

## 2019-09-01 DIAGNOSIS — I10 Essential (primary) hypertension: Secondary | ICD-10-CM | POA: Diagnosis not present

## 2019-09-01 DIAGNOSIS — E1140 Type 2 diabetes mellitus with diabetic neuropathy, unspecified: Secondary | ICD-10-CM | POA: Diagnosis not present

## 2019-09-27 DIAGNOSIS — Z0001 Encounter for general adult medical examination with abnormal findings: Secondary | ICD-10-CM | POA: Diagnosis not present

## 2019-09-27 DIAGNOSIS — E1169 Type 2 diabetes mellitus with other specified complication: Secondary | ICD-10-CM | POA: Diagnosis not present

## 2019-09-27 DIAGNOSIS — E781 Pure hyperglyceridemia: Secondary | ICD-10-CM | POA: Diagnosis not present

## 2019-09-27 DIAGNOSIS — M1612 Unilateral primary osteoarthritis, left hip: Secondary | ICD-10-CM | POA: Diagnosis not present

## 2019-09-27 DIAGNOSIS — I10 Essential (primary) hypertension: Secondary | ICD-10-CM | POA: Diagnosis not present

## 2019-09-27 DIAGNOSIS — Z1389 Encounter for screening for other disorder: Secondary | ICD-10-CM | POA: Diagnosis not present

## 2019-09-27 DIAGNOSIS — E1140 Type 2 diabetes mellitus with diabetic neuropathy, unspecified: Secondary | ICD-10-CM | POA: Diagnosis not present

## 2019-09-27 DIAGNOSIS — E782 Mixed hyperlipidemia: Secondary | ICD-10-CM | POA: Diagnosis not present

## 2019-09-27 DIAGNOSIS — I252 Old myocardial infarction: Secondary | ICD-10-CM | POA: Diagnosis not present

## 2019-09-27 DIAGNOSIS — Z125 Encounter for screening for malignant neoplasm of prostate: Secondary | ICD-10-CM | POA: Diagnosis not present

## 2019-09-27 DIAGNOSIS — Z89422 Acquired absence of other left toe(s): Secondary | ICD-10-CM | POA: Diagnosis not present

## 2019-09-30 DIAGNOSIS — I252 Old myocardial infarction: Secondary | ICD-10-CM | POA: Diagnosis not present

## 2019-09-30 DIAGNOSIS — E782 Mixed hyperlipidemia: Secondary | ICD-10-CM | POA: Diagnosis not present

## 2019-09-30 DIAGNOSIS — I10 Essential (primary) hypertension: Secondary | ICD-10-CM | POA: Diagnosis not present

## 2019-09-30 DIAGNOSIS — I2510 Atherosclerotic heart disease of native coronary artery without angina pectoris: Secondary | ICD-10-CM | POA: Diagnosis not present

## 2019-09-30 DIAGNOSIS — E1140 Type 2 diabetes mellitus with diabetic neuropathy, unspecified: Secondary | ICD-10-CM | POA: Diagnosis not present

## 2019-09-30 DIAGNOSIS — E1169 Type 2 diabetes mellitus with other specified complication: Secondary | ICD-10-CM | POA: Diagnosis not present

## 2019-09-30 DIAGNOSIS — E781 Pure hyperglyceridemia: Secondary | ICD-10-CM | POA: Diagnosis not present

## 2019-09-30 DIAGNOSIS — E785 Hyperlipidemia, unspecified: Secondary | ICD-10-CM | POA: Diagnosis not present

## 2019-09-30 DIAGNOSIS — M1612 Unilateral primary osteoarthritis, left hip: Secondary | ICD-10-CM | POA: Diagnosis not present

## 2019-10-24 ENCOUNTER — Ambulatory Visit (INDEPENDENT_AMBULATORY_CARE_PROVIDER_SITE_OTHER): Payer: Medicare HMO

## 2019-10-24 ENCOUNTER — Other Ambulatory Visit: Payer: Self-pay

## 2019-10-24 ENCOUNTER — Ambulatory Visit: Payer: Medicare HMO | Admitting: Orthopaedic Surgery

## 2019-10-24 ENCOUNTER — Encounter: Payer: Self-pay | Admitting: Orthopaedic Surgery

## 2019-10-24 VITALS — Ht 70.0 in | Wt 190.0 lb

## 2019-10-24 DIAGNOSIS — M25551 Pain in right hip: Secondary | ICD-10-CM | POA: Diagnosis not present

## 2019-10-24 DIAGNOSIS — M25552 Pain in left hip: Secondary | ICD-10-CM | POA: Diagnosis not present

## 2019-10-24 NOTE — Progress Notes (Signed)
Office Visit Note   Patient: [name]           Date of Birth: 1955-08-28           MRN: [id number] Visit Date: 10/24/2019              Requested by: Josetta Huddle, MD [address] [place],   [id number] PCP: Josetta Huddle, MD   Assessment & Plan: Visit Diagnoses:  1. Pain in left hip   2. Pain in right hip     Plan: Since his left hip is the most painful 1 to him and given the fact that he has failed conservative treatment with at least 2 left hip steroid injections, I would like to obtain a left hip MRI to assess the cartilage in the joint in general to get a better idea of what we are treating and whether or not a hip replacement is warranted.  He agrees with this treatment plan.  Watching him walk he is incredibly stiff gait and shuffles along.  It is definitely medically necessary at this point to obtain an MRI of the left hip to get a better idea of what is going on.  All questions and concerns were answered and addressed.  Follow-Up Instructions: Return in about 2 weeks (around 11/07/2019).   Orders:  Orders Placed This Encounter  Procedures  . XR HIPS BILAT W OR W/O PELVIS 2V   No orders of the defined types were placed in this encounter.     Procedures: No procedures performed   Clinical Data: No additional findings.   Subjective: Chief Complaint  Patient presents with  . Left Hip - Pain  . Right Hip - Pain  The patient is someone I am seeing for the first time.  He is referred from his primary care physician Dr. Inda Merlin.  This is for bilateral hip pain with the left worse than the right.  It has been getting slowly worse for about 8 years now.  He said he is very stiff in the morning.  He hurts in the groin on both sides with left worse than the right.  He has a diabetic and reports a hemoglobin A1c of under 7.  He is a daily smoker.  He is also on Plavix.  He has never had surgery on either hip.  He has a chronic herniated disc in his back  per his report.  He did see another orthopedic surgeon in town who had an intra-articular injection placed which helped for about 2 years.  He then reports 1 in his left hip about 6 months ago that has not decreased his symptoms enough.  His hip pain is daily and is detrimentally affecting his mobility, his quality of life and his activities day living.  HPI  Review of Systems He currently denies any headache, chest pain, shortness of breath, fever, chills, nausea, vomiting  Objective: Vital Signs: Ht 5\' 10"  (1.778 m)   Wt 190 lb (86.2 kg)   BMI 27.26 kg/m   Physical Exam He is alert and orient x3 and in no acute distress Ortho Exam Examination of both hips shows no blocks to rotation.  They are stiff but the rotation is full.  His pain seems to be in the groin. Specialty Comments:  No specialty comments available.  Imaging: XR HIPS BILAT W OR W/O PELVIS 2V  Result Date: 10/24/2019 A low AP pelvis and lateral of the left hip and right hip shows  no acute findings.  There is mild arthritic changes but the joint space is still well-maintained.    PMFS History: Patient Active Problem List   Diagnosis Date Noted  . Cigarette smoker 12/17/2015  . S/P femoral-popliteal bypass surgery 07/16/2015  . Atherosclerosis of native arteries of the extremities with ulceration ([place]) 07/16/2015  . Aftercare following surgery of the circulatory system 07/16/2015  . Sleep apnea 04/27/2015  . Subacute osteomyelitis, left ankle and foot ([place]) 03/30/2015  . Diabetic foot ([place]) 03/30/2015  . Acute osteomyelitis of left foot ([place]) 03/16/2015  . Septic arthritis of left foot ([place]) 03/16/2015  . Dyslipidemia associated with type 2 diabetes mellitus ([place]) 03/16/2015  . Colon cancer screening 01/30/2015  . Left carotid bruit 12/11/2014  . PAD (peripheral artery disease) ([place]) 03/26/2014  . Hypertriglyceridemia 03/26/2014  . Osteoarthritis of right hip 04/09/2012  . Encounter for long-term (current)  use of other medications 10/16/2011  . Type 2 diabetes mellitus with vascular disease ([place]) 06/22/2009  . Hyperlipidemia with target LDL less than 100 04/16/2008  . Essential hypertension 04/16/2008  . CAD, NATIVE VESSEL 04/16/2008   Past Medical History:  Diagnosis Date  . Acute osteomyelitis of left foot ([place]) 03/16/2015  . Asthma    bronchitis to asthma many years  . CAD, NATIVE VESSEL 04/16/2008   Qualifier: Diagnosis of  By: Aundra Dubin, MD, [place]    . Cancer ([place])    non-hodgkins lymphoma  2000  recd radiation  . Cellulitis and abscess of leg, except foot 02/21/2015  . Colon cancer screening 01/30/2015  . Diabetic ulcer of left foot with necrosis of muscle ([place]) 02/21/2015  . Dyslipidemia associated with type 2 diabetes mellitus ([place]) 03/16/2015  . Encounter for long-term (current) use of other medications 10/16/2011  . Essential hypertension 04/16/2008   Qualifier: Diagnosis of  By: Aundra Dubin, MD, [place]    . Hyperlipidemia with target LDL less than 100 04/16/2008       . Hypertriglyceridemia 03/26/2014  . Left carotid bruit 12/11/2014  . Myocardial infarction ([place])    2009  . Osteoarthritis of right hip 04/09/2012  . PAD (peripheral artery disease) ([place]) 03/26/2014  . Peripheral vascular disease, unspecified ([place]) 11/17/2011  . Septic arthritis of left foot ([place]) 03/16/2015  . Type 2 diabetes mellitus with vascular disease ([place]) 06/22/2009   Qualifier: Diagnosis of  By: Karrie Meres RN, BSN, [place]    . Uncontrolled diabetes mellitus type 2 with peripheral artery disease ([place]) 03/16/2015    Family History  Problem Relation Age of Onset  . Heart attack Mother        27's  . Heart disease Mother        Heart disease before age 77  . Hypertension Mother   . Hyperlipidemia Mother   . Peripheral Artery Disease Father   . Hypertension Father   . Hyperlipidemia Father   . Peripheral vascular disease Father   . Heart attack Unknown        40's/40's    Past Surgical History:   Procedure Laterality Date  . AMPUTATION Left 10/09/2017   Procedure: LEFT 5TH RAY AMPUTATION;  Surgeon: Newt Minion, MD;  Location: [facility];  Service: Orthopedics;  Laterality: Left;  . CARDIAC CATHETERIZATION     2011  . FEMORAL-TIBIAL BYPASS GRAFT Left 05/04/2015   Procedure: LEFT  FEMORAL-BELOW THE KNEE POPLITEAL  ARTERY BYPASS GRAFT USING NON REVERSE LEFT GREATER SAPHENOUS VEIN;  Surgeon: Serafina Mitchell, MD;  Location: [facility];  Service: Vascular;  Laterality: Left;  .  INTRAOPERATIVE ARTERIOGRAM Left 05/04/2015   Procedure: INTRA OPERATIVE ARTERIOGRAM TIMES TWO;  Surgeon: Serafina Mitchell, MD;  Location: [facility];  Service: Vascular;  Laterality: Left;  . LEG SURGERY     on bone above ankle-not sure which bone left leg 1981  . LEG SURGERY  1981   Bone cyst- Left leg  . MULTIPLE EXTRACTIONS WITH ALVEOLOPLASTY  10/31/2011   Procedure: MULTIPLE EXTRACION WITH ALVEOLOPLASTY;  Surgeon: Lenn Cal, DDS;  Location: [facility];  Service: Oral Surgery;  Laterality: N/A;  Extraction of tooth #'s 2, 4,5,6,7,8,9,10,11,12,13,15,21,22,23,24,25,26,27,28, 29, and 31 with alveoloplasty  . PERIPHERAL VASCULAR CATHETERIZATION N/A 04/05/2015   Procedure: Abdominal Aortogram w/Lower Extremity;  Surgeon: Conrad [place], MD;  Location: [facility];  Service: Cardiovascular;  Laterality: N/A;  . PR VEIN BYPASS GRAFT,AORTO-FEM-POP    . TONSILLECTOMY     Social History   Occupational History    Comment: Disabled. Former Biomedical scientist.  Tobacco Use  . Smoking status: Light Tobacco Smoker    Packs/day: 0.50    Years: 43.00    Pack years: 21.50    Types: Cigarettes  . Smokeless tobacco: Never Used  . Tobacco comment: heavy 2nd hand exposue ongoing  Vaping Use  . Vaping Use: Never used  Substance and Sexual Activity  . Alcohol use: No    Alcohol/week: 0.0 standard drinks    Comment: former heavy user quit 2011  . Drug use: No  . Sexual activity: Not on file

## 2019-11-03 ENCOUNTER — Telehealth: Payer: Self-pay | Admitting: Orthopaedic Surgery

## 2019-11-03 ENCOUNTER — Other Ambulatory Visit: Payer: Self-pay

## 2019-11-03 ENCOUNTER — Telehealth: Payer: Self-pay | Admitting: *Deleted

## 2019-11-03 DIAGNOSIS — M25552 Pain in left hip: Secondary | ICD-10-CM

## 2019-11-03 NOTE — Telephone Encounter (Signed)
Pt called to check on referral for MRI was last seen in office on 10/24/19. If CB wants MRI please order.

## 2019-11-03 NOTE — Telephone Encounter (Signed)
It's in, sorry about that

## 2019-11-03 NOTE — Telephone Encounter (Signed)
Patient called.   Wanted to check the status of his referral to have an MRI done. I assured him that the referral was made but that authorization may take some time.

## 2019-11-06 ENCOUNTER — Other Ambulatory Visit: Payer: Self-pay

## 2019-11-06 ENCOUNTER — Ambulatory Visit (INDEPENDENT_AMBULATORY_CARE_PROVIDER_SITE_OTHER): Payer: Medicare HMO

## 2019-11-06 DIAGNOSIS — R600 Localized edema: Secondary | ICD-10-CM | POA: Diagnosis not present

## 2019-11-06 DIAGNOSIS — M25552 Pain in left hip: Secondary | ICD-10-CM

## 2019-11-06 DIAGNOSIS — M1612 Unilateral primary osteoarthritis, left hip: Secondary | ICD-10-CM | POA: Diagnosis not present

## 2019-11-09 ENCOUNTER — Other Ambulatory Visit: Payer: Self-pay

## 2019-11-09 ENCOUNTER — Ambulatory Visit: Payer: Medicare HMO | Admitting: Orthopaedic Surgery

## 2019-11-09 ENCOUNTER — Encounter: Payer: Self-pay | Admitting: Orthopaedic Surgery

## 2019-11-09 DIAGNOSIS — M1612 Unilateral primary osteoarthritis, left hip: Secondary | ICD-10-CM

## 2019-11-09 NOTE — Progress Notes (Signed)
Office Visit Note   Patient: [name]           Date of Birth: 04-30-1956           MRN: [id number] Visit Date: 11/09/2019              Requested by: Josetta Huddle, MD [address] [place],  [place] [address] PCP: Josetta Huddle, MD   Assessment & Plan: Visit Diagnoses:  1. Primary osteoarthritis of left hip     Plan: Due to the fact the patient is failed conservative treatment which is included time, and to cortisone injections in the left hip with diminishing relief.  Would recommend left total hip arthroplasty given the findings on his MRI in conjunction with the conservative treatment.  Risk benefits of surgery discussed with patient at length.  Postop protocol discussed with patient.  Risk include but are not limited to nerve or vessel injury, wound healing problems, DVT/PE, prolonged pain and worsening pain.  See him back 2 weeks postop.  Given Samella Parr card we will schedule surgery for the near future.  He will need to stop his Plavix 7 days prior to surgery.  Follow-Up Instructions: Return 2 weeks post op.   Orders:  No orders of the defined types were placed in this encounter.  No orders of the defined types were placed in this encounter.     Procedures: No procedures performed   Clinical Data: No additional findings.   Subjective: Chief Complaint  Patient presents with  . Left Hip - Follow-up    HPI [name] returns today to go over the MRI of his left hip.  He continues to have pain in the left hip.  He states the hip is very stiff especially when he first begins to walk.  He has had 2 prior injections intra-articular left hip first injection gave him good relief the second injection is given him no relief whatsoever.  He has had no new injury to the hip.  Denies any recent fevers chills shortness of breath chest pain or ongoing infection.  Patient is on chronic Plavix as history of peripheral artery disease with a history of  femoral-popliteal bypass 313 of 17.  Is also a diabetic reports good control of his diabetes and blood pressure at this point in time.  Review of Systems  Constitutional: Negative for chills and fever.  Respiratory: Negative for shortness of breath.   Cardiovascular: Negative for chest pain.  Musculoskeletal: Positive for arthralgias and back pain.     Objective: Vital Signs: There were no vitals taken for this visit.  Physical Exam Constitutional:      Appearance: He is not ill-appearing or diaphoretic.  Pulmonary:     Effort: Pulmonary effort is normal.  Neurological:     Mental Status: He is alert and oriented to person, place, and time.  Psychiatric:        Mood and Affect: Mood normal.     Ortho Exam Bilateral hips reveal stiffness but full rotation.  Internal rotation left hip causes groin pain.  He walks with an antalgic gait with use cane in his right hand. Specialty Comments:  No specialty comments available.  Imaging: No results found.   PMFS History: Patient Active Problem List   Diagnosis Date Noted  . Cigarette smoker 12/17/2015  . S/P femoral-popliteal bypass surgery 07/16/2015  . Atherosclerosis of native arteries of the extremities with ulceration ([place]) 07/16/2015  . Aftercare following surgery of the  circulatory system 07/16/2015  . Sleep apnea 04/27/2015  . Subacute osteomyelitis, left ankle and foot ([place]) 03/30/2015  . Diabetic foot ([place]) 03/30/2015  . Acute osteomyelitis of left foot ([place]) 03/16/2015  . Septic arthritis of left foot ([place]) 03/16/2015  . Dyslipidemia associated with type 2 diabetes mellitus ([place]) 03/16/2015  . Colon cancer screening 01/30/2015  . Left carotid bruit 12/11/2014  . PAD (peripheral artery disease) ([place]) 03/26/2014  . Hypertriglyceridemia 03/26/2014  . Osteoarthritis of right hip 04/09/2012  . Encounter for long-term (current) use of other medications 10/16/2011  . Type 2 diabetes mellitus with vascular disease ([place])  06/22/2009  . Hyperlipidemia with target LDL less than 100 04/16/2008  . Essential hypertension 04/16/2008  . CAD, NATIVE VESSEL 04/16/2008   Past Medical History:  Diagnosis Date  . Acute osteomyelitis of left foot ([place]) 03/16/2015  . Asthma    bronchitis to asthma many years  . CAD, NATIVE VESSEL 04/16/2008   Qualifier: Diagnosis of  By: Aundra Dubin, MD, [place]    . Cancer ([place])    non-hodgkins lymphoma  2000  recd radiation  . Cellulitis and abscess of leg, except foot 02/21/2015  . Colon cancer screening 01/30/2015  . Diabetic ulcer of left foot with necrosis of muscle ([place]) 02/21/2015  . Dyslipidemia associated with type 2 diabetes mellitus ([place]) 03/16/2015  . Encounter for long-term (current) use of other medications 10/16/2011  . Essential hypertension 04/16/2008   Qualifier: Diagnosis of  By: Aundra Dubin, MD, [place]    . Hyperlipidemia with target LDL less than 100 04/16/2008       . Hypertriglyceridemia 03/26/2014  . Left carotid bruit 12/11/2014  . Myocardial infarction ([place])    2009  . Osteoarthritis of right hip 04/09/2012  . PAD (peripheral artery disease) ([place]) 03/26/2014  . Peripheral vascular disease, unspecified ([place]) 11/17/2011  . Septic arthritis of left foot ([place]) 03/16/2015  . Type 2 diabetes mellitus with vascular disease ([place]) 06/22/2009   Qualifier: Diagnosis of  By: Karrie Meres RN, BSN, Anne    . Uncontrolled diabetes mellitus type 2 with peripheral artery disease ([place]) 03/16/2015    Family History  Problem Relation Age of Onset  . Heart attack Mother        48's  . Heart disease Mother        Heart disease before age 76  . Hypertension Mother   . Hyperlipidemia Mother   . Peripheral Artery Disease Father   . Hypertension Father   . Hyperlipidemia Father   . Peripheral vascular disease Father   . Heart attack Unknown        40's/40's    Past Surgical History:  Procedure Laterality Date  . AMPUTATION Left 10/09/2017   Procedure: LEFT 5TH RAY AMPUTATION;   Surgeon: Newt Minion, MD;  Location: [facility];  Service: Orthopedics;  Laterality: Left;  . CARDIAC CATHETERIZATION     2011  . FEMORAL-TIBIAL BYPASS GRAFT Left 05/04/2015   Procedure: LEFT  FEMORAL-BELOW THE KNEE POPLITEAL  ARTERY BYPASS GRAFT USING NON REVERSE LEFT GREATER SAPHENOUS VEIN;  Surgeon: Serafina Mitchell, MD;  Location: [facility];  Service: Vascular;  Laterality: Left;  . INTRAOPERATIVE ARTERIOGRAM Left 05/04/2015   Procedure: INTRA OPERATIVE ARTERIOGRAM TIMES TWO;  Surgeon: Serafina Mitchell, MD;  Location: [facility];  Service: Vascular;  Laterality: Left;  . LEG SURGERY     on bone above ankle-not sure which bone left leg 1981  . LEG SURGERY  1981   Bone cyst- Left leg  .  MULTIPLE EXTRACTIONS WITH ALVEOLOPLASTY  10/31/2011   Procedure: MULTIPLE EXTRACION WITH ALVEOLOPLASTY;  Surgeon: Lenn Cal, DDS;  Location: [facility];  Service: Oral Surgery;  Laterality: N/A;  Extraction of tooth #'s 2, 4,5,6,7,8,9,10,11,12,13,15,21,22,23,24,25,26,27,28, 29, and 31 with alveoloplasty  . PERIPHERAL VASCULAR CATHETERIZATION N/A 04/05/2015   Procedure: Abdominal Aortogram w/Lower Extremity;  Surgeon: Conrad , MD;  Location: [facility];  Service: Cardiovascular;  Laterality: N/A;  . PR VEIN BYPASS GRAFT,AORTO-FEM-POP    . TONSILLECTOMY     Social History   Occupational History    Comment: Disabled. Former Biomedical scientist.  Tobacco Use  . Smoking status: Light Tobacco Smoker    Packs/day: 0.50    Years: 43.00    Pack years: 21.50    Types: Cigarettes  . Smokeless tobacco: Never Used  . Tobacco comment: heavy 2nd hand exposue ongoing  Vaping Use  . Vaping Use: Never used  Substance and Sexual Activity  . Alcohol use: No    Alcohol/week: 0.0 standard drinks    Comment: former heavy user quit 2011  . Drug use: No  . Sexual activity: Not on file

## 2019-11-25 ENCOUNTER — Other Ambulatory Visit: Payer: Self-pay

## 2019-12-06 NOTE — Progress Notes (Addendum)
COVID Vaccine Completed: x2 Date COVID Vaccine completed:  07-16-19 & 08-13-19 COVID vaccine manufacturer: Pfizer    Moderna   [employer]   PCP - Dr. Josetta Huddle Cardiologist - Dr. Sherren Mocha, see as needed  Chest x-ray - N/A EKG - 12-29-18 in Epic Stress Test -  06-24-14 in Epic  ECHO - 09-08-16 in [facility] Cardiac Cath - 2011 Peripheral vascular cath 04-05-15 in Epic  Sleep Study - N/A CPAP - N/A  Fasting Blood Sugar - N/A Checks Blood Sugar - no loger checks  Blood Thinner Instructions:  Plavix 75 mg Aspirin Instructions: N/A Last Dose:  12-08-2019  Anesthesia review: s/p fem pop bypass, L carotid bruit, sleep apnea, atherosclerosis, PAD, CAD, DM II  Patient denies shortness of breath, fever, cough and chest pain at PAT appointment   Patient verbalized understanding of instructions that were given to them at the PAT appointment. Patient was also instructed that they will need to review over the PAT instructions again at home before surgery.

## 2019-12-06 NOTE — Patient Instructions (Addendum)
DUE TO COVID-19 ONLY ONE VISITOR IS ALLOWED TO COME WITH YOU AND STAY IN THE WAITING ROOM ONLY DURING PRE OP AND  PROCEDURE.   IF YOU WILL BE ADMITTED INTO THE HOSPITAL YOU ARE ALLOWED ONE SUPPORT PERSON DURING VISITATION HOURS ONLY (10AM -8PM)   . The support person may change daily. . The support person must pass our screening, gel in and out, and wear a mask at all times, including in the patient's room. . Patients must also wear a mask when staff or their support person are in the room.     COVID SWAB TESTING MUST BE COMPLETED ON:  Tuesday, 12-13-19 @ 11:35 AM    [address] [place], [place] [address]  (Must self quarantine after testing. Follow instructions on handout.)        Your procedure is scheduled on: Friday, 12-16-19   Report to [facility] Main  Entrance   Report to admitting at 9:45 AM   Call this number if you have problems the morning of surgery [phone]   Do not eat food :After Midnight.   May have liquids until 9:15 AM  day of surgery   CLEAR LIQUID DIET  Foods Allowed                                                                     Foods Excluded  Water, Black Coffee and tea, regular and decaf              liquids that you cannot  Plain Jell-O in any flavor  (No red)                                    see through such as: Fruit ices (not with fruit pulp)                                      milk, soups, orange juice              Iced Popsicles (No red)                                      All solid food                                   Apple juices Sports drinks like Gatorade (No red) Lightly seasoned clear broth or consume(fat free) Sugar, honey syrup    Complete one G2 drink the morning of surgery at  9:15 AM the day of surgery.    Oral Hygiene is also important to reduce your risk of infection.                                    Remember - BRUSH YOUR TEETH THE MORNING OF SURGERY WITH YOUR REGULAR TOOTHPASTE   Do NOT smoke after  Midnight   Take  these medicines the morning of surgery with A SIP OF WATER: Isosorbide, Metoprolol and Pravastatin   DO NOT TAKE ANY ORAL DIABETIC MEDICATIONS DAY OF YOUR SURGERY                               You may not have any metal on your body including jewelry, and body piercings              Do not wear make-up, lotions, powders, perfumes/cologne, or deodorant              Men may shave face and neck.     Do not bring valuables to the hospital. [place].   Contacts, dentures or bridgework may not be worn into surgery.   Bring small overnight bag day of surgery.     Special Instructions: Bring a copy of your healthcare power of attorney and living will documents the day of surgery if you haven't scanned them in  before.              Please read over the following fact sheets you were given: IF YOU HAVE QUESTIONS ABOUT YOUR PRE OP [place]  [phone]   How to Manage Your Diabetes Before and After Surgery  Why is it important to control my blood sugar before and after surgery? . Improving blood sugar levels before and after surgery helps healing and can limit problems. . A way of improving blood sugar control is eating a healthy diet by: o  Eating less sugar and carbohydrates o  Increasing activity/exercise o  Talking with your doctor about reaching your blood sugar goals . High blood sugars (greater than 180 mg/dL) can raise your risk of infections and slow your recovery, so you will need to focus on controlling your diabetes during the weeks before surgery. . Make sure that the doctor who takes care of your diabetes knows about your planned surgery including the date and location.  How do I manage my blood sugar before surgery? . Check your blood sugar at least 4 times a day, starting 2 days before surgery, to make sure that the level is not too high or low. o Check your blood sugar the morning of your surgery when  you wake up and every 2 hours until you get to the Short Stay unit. . If your blood sugar is less than 70 mg/dL, you will need to treat for low blood sugar: o Do not take insulin. o Treat a low blood sugar (less than 70 mg/dL) with  cup of clear juice (cranberry or apple), 4 glucose tablets, OR glucose gel. o Recheck blood sugar in 15 minutes after treatment (to make sure it is greater than 70 mg/dL). If your blood sugar is not greater than 70 mg/dL on recheck, call [phone] for further instructions. . Report your blood sugar to the short stay nurse when you get to Short Stay.  . If you are admitted to the hospital after surgery: o Your blood sugar will be checked by the staff and you will probably be given insulin after surgery (instead of oral diabetes medicines) to make sure you have good blood sugar levels. o The goal for blood sugar control after surgery is 80-180 mg/dL.   WHAT DO I DO ABOUT MY DIABETES MEDICATION?  Marland Kitchen Do not take oral diabetes medicines (pills) the morning of surgery.  Marland Kitchen  THE DAY BEFORE SURGERY:  Take usual dose of Metformin.   -    Take Glimepiride in the morning.  -     Insulin Glargine (Toujeo) at noon..       . THE MORNING OF SURGERY:  Take 15 units of Insulin Glargine (Toujeo).  Do not take Metformin or Glimepiride    Reviewed and Endorsed by [facility] Patient Education Committee, August 2015    Incentive Spirometer  An incentive spirometer is a tool that can help keep your lungs clear and active. This tool measures how well you are filling your lungs with each breath. Taking long deep breaths may help reverse or decrease the chance of developing breathing (pulmonary) problems (especially infection) following:  A long period of time when you are unable to move or be active. BEFORE THE PROCEDURE   If the spirometer includes an indicator to show your best effort, your nurse or respiratory therapist will set it to a desired goal.  If possible, sit  up straight or lean slightly forward. Try not to slouch.  Hold the incentive spirometer in an upright position. INSTRUCTIONS FOR USE  1. Sit on the edge of your bed if possible, or sit up as far as you can in bed or on a chair. 2. Hold the incentive spirometer in an upright position. 3. Breathe out normally. 4. Place the mouthpiece in your mouth and seal your lips tightly around it. 5. Breathe in slowly and as deeply as possible, raising the piston or the ball toward the top of the column. 6. Hold your breath for 3-5 seconds or for as long as possible. Allow the piston or ball to fall to the bottom of the column. 7. Remove the mouthpiece from your mouth and breathe out normally. 8. Rest for a few seconds and repeat Steps 1 through 7 at least 10 times every 1-2 hours when you are awake. Take your time and take a few normal breaths between deep breaths. 9. The spirometer may include an indicator to show your best effort. Use the indicator as a goal to work toward during each repetition. 10. After each set of 10 deep breaths, practice coughing to be sure your lungs are clear. If you have an incision (the cut made at the time of surgery), support your incision when coughing by placing a pillow or rolled up towels firmly against it. Once you are able to get out of bed, walk around indoors and cough well. You may stop using the incentive spirometer when instructed by your caregiver.  RISKS AND COMPLICATIONS  Take your time so you do not get dizzy or light-headed.  If you are in pain, you may need to take or ask for pain medication before doing incentive spirometry. It is harder to take a deep breath if you are having pain. AFTER USE  Rest and breathe slowly and easily.  It can be helpful to keep track of a log of your progress. Your caregiver can provide you with a simple table to help with this. If you are using the spirometer at home, follow these instructions: [place] IF:   You are  having difficultly using the spirometer.  You have trouble using the spirometer as often as instructed.  Your pain medication is not giving enough relief while using the spirometer.  You develop fever of 100.5 F (38.1 C) or higher. SEEK IMMEDIATE MEDICAL CARE IF:   You cough up bloody sputum that had not been present before.  You develop fever of 102 F (38.9 C) or greater.  You develop worsening pain at or near the incision site. MAKE SURE YOU:   Understand these instructions.  Will watch your condition.  Will get help right away if you are not doing well or get worse. Document Released: 09/01/2006 Document Revised: 07/14/2011 Document Reviewed: 11/02/2006 ExitCare Patient Information 2014 Exit[employer], [place].   ________________________________________________________________________  WHAT IS A BLOOD TRANSFUSION? Blood Transfusion Information  A transfusion is the replacement of blood or some of its parts. Blood is made up of multiple cells which provide different functions.  Red blood cells carry oxygen and are used for blood loss replacement.  White blood cells fight against infection.  Platelets control bleeding.  Plasma helps clot blood.  Other blood products are available for specialized needs, such as hemophilia or other clotting disorders. BEFORE THE TRANSFUSION  Who gives blood for transfusions?   Healthy volunteers who are fully evaluated to make sure their blood is safe. This is blood bank blood. Transfusion therapy is the safest it has ever been in the practice of medicine. Before blood is taken from a donor, a complete history is taken to make sure that person has no history of diseases nor engages in risky social behavior (examples are intravenous drug use or sexual activity with multiple partners). The donor's travel history is screened to minimize risk of transmitting infections, such as malaria. The donated blood is tested for signs of infectious diseases,  such as HIV and hepatitis. The blood is then tested to be sure it is compatible with you in order to minimize the chance of a transfusion reaction. If you or a relative donates blood, this is often done in anticipation of surgery and is not appropriate for emergency situations. It takes many days to process the donated blood. RISKS AND COMPLICATIONS Although transfusion therapy is very safe and saves many lives, the main dangers of transfusion include:   Getting an infectious disease.  Developing a transfusion reaction. This is an allergic reaction to something in the blood you were given. Every precaution is taken to prevent this. The decision to have a blood transfusion has been considered carefully by your caregiver before blood is given. Blood is not given unless the benefits outweigh the risks. AFTER THE TRANSFUSION  Right after receiving a blood transfusion, you will usually feel much better and more energetic. This is especially true if your red blood cells have gotten low (anemic). The transfusion raises the level of the red blood cells which carry oxygen, and this usually causes an energy increase.  The nurse administering the transfusion will monitor you carefully for complications. HOME CARE INSTRUCTIONS  No special instructions are needed after a transfusion. You may find your energy is better. Speak with your caregiver about any limitations on activity for underlying diseases you may have. SEEK MEDICAL CARE IF:   Your condition is not improving after your transfusion.  You develop redness or irritation at the intravenous (IV) site. SEEK IMMEDIATE MEDICAL CARE IF:  Any of the following symptoms occur over the next 12 hours:  Shaking chills.  You have a temperature by mouth above 102 F (38.9 C), not controlled by medicine.  Chest, back, or muscle pain.  People around you feel you are not acting correctly or are confused.  Shortness of breath or difficulty  breathing.  Dizziness and fainting.  You get a rash or develop hives.  You have a decrease in urine output.  Your urine turns  a dark color or changes to pink, red, or brown. Any of the following symptoms occur over the next 10 days:  You have a temperature by mouth above 102 F (38.9 C), not controlled by medicine.  Shortness of breath.  Weakness after normal activity.  The white part of the eye turns yellow (jaundice).  You have a decrease in the amount of urine or are urinating less often.  Your urine turns a dark color or changes to pink, red, or brown. Document Released: 04/18/2000 Document Revised: 07/14/2011 Document Reviewed: 12/06/2007 [facility] Patient Information 2014 [place], [place].  _______________________________________________________________________

## 2019-12-06 NOTE — Progress Notes (Signed)
Please enter orders for PAT visit 12-13-19.  Patient scheduled for surgery 12-16-19.  Thank you

## 2019-12-09 ENCOUNTER — Other Ambulatory Visit: Payer: Self-pay | Admitting: Physician Assistant

## 2019-12-13 ENCOUNTER — Other Ambulatory Visit (HOSPITAL_COMMUNITY)
Admission: RE | Admit: 2019-12-13 | Discharge: 2019-12-13 | Disposition: A | Discharge status: Home / Self Care | Payer: Medicare HMO | Source: Ambulatory Visit | Attending: Orthopaedic Surgery | Admitting: Orthopaedic Surgery

## 2019-12-13 ENCOUNTER — Other Ambulatory Visit: Payer: Self-pay

## 2019-12-13 ENCOUNTER — Encounter (HOSPITAL_COMMUNITY)
Admission: RE | Admit: 2019-12-13 | Discharge: 2019-12-13 | Disposition: A | Discharge status: Home / Self Care | Payer: Medicare HMO | Source: Ambulatory Visit | Attending: Orthopaedic Surgery | Admitting: Orthopaedic Surgery

## 2019-12-13 ENCOUNTER — Encounter (HOSPITAL_COMMUNITY): Payer: Self-pay

## 2019-12-13 DIAGNOSIS — Z20822 Contact with and (suspected) exposure to covid-19: Secondary | ICD-10-CM | POA: Insufficient documentation

## 2019-12-13 DIAGNOSIS — Z01812 Encounter for preprocedural laboratory examination: Secondary | ICD-10-CM | POA: Diagnosis not present

## 2019-12-13 HISTORY — DX: Pneumonia, unspecified organism: J18.9

## 2019-12-13 LAB — CBC
HCT: 47.2 % (ref 39.0–52.0)
Hemoglobin: 16.9 g/dL (ref 13.0–17.0)
MCH: 34.7 pg — ABNORMAL HIGH (ref 26.0–34.0)
MCHC: 35.8 g/dL (ref 30.0–36.0)
MCV: 96.9 fL (ref 80.0–100.0)
Platelets: 188 10*3/uL (ref 150–400)
RBC: 4.87 MIL/uL (ref 4.22–5.81)
RDW: 13.6 % (ref 11.5–15.5)
WBC: 8.2 10*3/uL (ref 4.0–10.5)
nRBC: 0 % (ref 0.0–0.2)

## 2019-12-13 LAB — BASIC METABOLIC PANEL
Anion gap: 11 (ref 5–15)
BUN: 16 mg/dL (ref 8–23)
CO2: 24 mmol/L (ref 22–32)
Calcium: 9.7 mg/dL (ref 8.9–10.3)
Chloride: 100 mmol/L (ref 98–111)
Creatinine, Ser: 1.18 mg/dL (ref 0.61–1.24)
GFR calc Af Amer: >60 mL/min (ref >60)
GFR calc non Af Amer: >60 mL/min (ref >60)
Glucose, Bld: 164 mg/dL — ABNORMAL HIGH (ref 70–99)
Potassium: 4.5 mmol/L (ref 3.5–5.1)
Sodium: 135 mmol/L (ref 135–145)

## 2019-12-13 LAB — HEMOGLOBIN A1C
Hgb A1c MFr Bld: 6.6 % — ABNORMAL HIGH (ref 4.8–5.6)
Mean Plasma Glucose: 142.72 mg/dL

## 2019-12-13 LAB — SARS CORONAVIRUS 2 (TAT 6-24 HRS): SARS Coronavirus 2: NEGATIVE

## 2019-12-13 LAB — SURGICAL PCR SCREEN
MRSA, PCR: NEGATIVE
Staphylococcus aureus: NEGATIVE

## 2019-12-13 LAB — GLUCOSE, CAPILLARY: Glucose-Capillary: 199 mg/dL — ABNORMAL HIGH (ref 70–99)

## 2019-12-14 NOTE — Progress Notes (Signed)
Anesthesia Chart Review   Case: [id number] Date/Time: 12/16/19 1200   Procedure: LEFT TOTAL HIP ARTHROPLASTY ANTERIOR APPROACH (Left Hip)   Anesthesia type: Choice   Pre-op diagnosis: left hip osteoarthritis   Location: [facility] ROOM 09 / [facility]   Surgeons: [name], MD      DISCUSSION:64 y.o. heavy tobacco smoker (43 pack years) with h/o HTN, DM II, CAD, asthma, PAD s/p fem pop bypass, sleep apnea, left hip OA scheduled for above procedure 12/16/2019 with Dr. Jean Rosenthal.   Last seen by cardiology 12/29/2018.  Stable at this visit with 1 year follow up recommended, this appt has not happened yet.  Pt denies CV sx.  Plavix held 7 days prior to procedure.    Anticipate pt can proceed with planned procedure barring acute status change.   VS: BP (!) 143/76   Pulse 68   Temp 36.9 C (Oral)   Resp 18   Ht 5\' 10"  (1.778 m)   Wt 86.3 kg   SpO2 97%   BMI 27.29 kg/m   PROVIDERS: Josetta Huddle, MD is PCP   Sherren Mocha, MD is Cardiologist  LABS: Labs reviewed: Acceptable for surgery. (all labs ordered are listed, but only abnormal results are displayed)  Labs Reviewed  BASIC METABOLIC PANEL - Abnormal; Notable for the following components:      Result Value   Glucose, Bld 164 (*)    All other components within normal limits  CBC - Abnormal; Notable for the following components:   MCH 34.7 (*)    All other components within normal limits  HEMOGLOBIN A1C - Abnormal; Notable for the following components:   Hgb A1c MFr Bld 6.6 (*)    All other components within normal limits  GLUCOSE, CAPILLARY - Abnormal; Notable for the following components:   Glucose-Capillary 199 (*)    All other components within normal limits  SURGICAL PCR SCREEN  TYPE AND SCREEN     IMAGES:   EKG: 12/29/2018 Rate 64 bpm NSR  CV: Echo 09/08/2016 Study Conclusions   - Left ventricle: The cavity size was normal. Wall thickness was  increased in a pattern of mild LVH. There was  mild focal basal  hypertrophy of the septum. Systolic function was mildly to  moderately reduced. The estimated ejection fraction was in the  range of 40% to 45%. There is hypokinesis of the mid-apical  septal and apical myocardium. There is hypokinesis of the  inferolateral myocardium. Doppler parameters are consistent with  abnormal left ventricular relaxation (grade 1 diastolic  dysfunction). Doppler parameters are consistent with high  ventricular filling pressure.  - Left atrium: The atrium was mildly dilated.   Impressions:   - Hypokinesis of the inferolateral wall, distal septum and apex  with mild to moderate LV dysfunction; grade 1 diastolic  dysfunction with elevated LV filling pressure; mild LVH; mild  LAE.  Myocardial Perfusion 04/24/2015  The left ventricular ejection fraction is mildly decreased (45-54%).  Nuclear stress EF: 45%.  There was no ST segment deviation noted during stress.  Defect 1: There is a medium defect of severe severity present in the basal inferolateral, basal anterolateral, mid inferolateral, mid anterolateral and apical lateral location.  Findings consistent with prior lateral wall myocardial infarction with small amount of peri-infarct ischemia.  This is an intermediate risk study.    Past Medical History:  Diagnosis Date  . Acute osteomyelitis of left foot ([place]) 03/16/2015  . Asthma    bronchitis to asthma many years  .  CAD, NATIVE VESSEL 04/16/2008   Qualifier: Diagnosis of  By: Aundra Dubin, MD, [place]    . Cancer ([place])    non-hodgkins lymphoma  2000  recd radiation  . Cellulitis and abscess of leg, except foot 02/21/2015  . Colon cancer screening 01/30/2015  . Diabetic ulcer of left foot with necrosis of muscle ([place]) 02/21/2015  . Dyslipidemia associated with type 2 diabetes mellitus ([place]) 03/16/2015  . Encounter for long-term (current) use of other medications 10/16/2011  . Essential hypertension 04/16/2008    Qualifier: Diagnosis of  By: Aundra Dubin, MD, [place]    . Hyperlipidemia with target LDL less than 100 04/16/2008       . Hypertriglyceridemia 03/26/2014  . Left carotid bruit 12/11/2014  . Myocardial infarction ([place])    2009  . Osteoarthritis of right hip 04/09/2012  . PAD (peripheral artery disease) ([place]) 03/26/2014  . Peripheral vascular disease, unspecified ([place]) 11/17/2011  . Pneumonia    As a child  . Septic arthritis of left foot ([place]) 03/16/2015  . Type 2 diabetes mellitus with vascular disease ([place]) 06/22/2009   Qualifier: Diagnosis of  By: Karrie Meres RN, BSN, [place]    . Uncontrolled diabetes mellitus type 2 with peripheral artery disease ([place]) 03/16/2015    Past Surgical History:  Procedure Laterality Date  . AMPUTATION Left 10/09/2017   Procedure: LEFT 5TH RAY AMPUTATION;  Surgeon: Newt Minion, MD;  Location: [place];  Service: Orthopedics;  Laterality: Left;  . CARDIAC CATHETERIZATION     2011  . FEMORAL-TIBIAL BYPASS GRAFT Left 05/04/2015   Procedure: LEFT  FEMORAL-BELOW THE KNEE POPLITEAL  ARTERY BYPASS GRAFT USING NON REVERSE LEFT GREATER SAPHENOUS VEIN;  Surgeon: Serafina Mitchell, MD;  Location: [facility];  Service: Vascular;  Laterality: Left;  . INTRAOPERATIVE ARTERIOGRAM Left 05/04/2015   Procedure: INTRA OPERATIVE ARTERIOGRAM TIMES TWO;  Surgeon: Serafina Mitchell, MD;  Location: [place];  Service: Vascular;  Laterality: Left;  . LEG SURGERY     on bone above ankle-not sure which bone left leg 1981  . LEG SURGERY  1981   Bone cyst- Left leg  . MULTIPLE EXTRACTIONS WITH ALVEOLOPLASTY  10/31/2011   Procedure: MULTIPLE EXTRACION WITH ALVEOLOPLASTY;  Surgeon: Lenn Cal, DDS;  Location: [facility];  Service: Oral Surgery;  Laterality: N/A;  Extraction of tooth #'s 2, 4,5,6,7,8,9,10,11,12,13,15,21,22,23,24,25,26,27,28, 29, and 31 with alveoloplasty  . PERIPHERAL VASCULAR CATHETERIZATION N/A 04/05/2015   Procedure: Abdominal Aortogram w/Lower Extremity;  Surgeon: Conrad Gaylord, MD;   Location: [facility] [place] [facility];  Service: Cardiovascular;  Laterality: N/A;  . PR VEIN BYPASS GRAFT,AORTO-FEM-POP    . TONSILLECTOMY      MEDICATIONS: . aspirin 81 MG tablet  . cilostazol (PLETAL) 100 MG tablet  . clopidogrel (PLAVIX) 75 MG tablet  . glimepiride (AMARYL) 1 MG tablet  . glucose blood (COOL BLOOD GLUCOSE TEST STRIPS) test strip  . Insulin Glargine (TOUJEO SOLOSTAR) 300 UNIT/ML SOPN  . Insulin Pen Needle (NOVOFINE) 32G X 6 MM MISC  . isosorbide mononitrate (IMDUR) 30 MG 24 hr tablet  . Krill Oil 1000 MG CAPS  . lisinopril (ZESTRIL) 40 MG tablet  . metFORMIN (GLUCOPHAGE) 1000 MG tablet  . metoprolol tartrate (LOPRESSOR) 50 MG tablet  . naproxen (NAPROSYN) 375 MG tablet  . pravastatin (PRAVACHOL) 80 MG tablet  . rosuvastatin (CRESTOR) 5 MG tablet  . TRUEPLUS LANCETS 28G MISC   No current facility-administered medications for this encounter.    Konrad Felix, PA-C [facility] Pre-Surgical Testing [phone]

## 2019-12-15 ENCOUNTER — Telehealth: Payer: Self-pay | Admitting: *Deleted

## 2019-12-15 DIAGNOSIS — M1612 Unilateral primary osteoarthritis, left hip: Secondary | ICD-10-CM

## 2019-12-15 NOTE — Telephone Encounter (Signed)
Ortho bundle pre-op call completed. 

## 2019-12-15 NOTE — H&P (Signed)
TOTAL HIP ADMISSION H&P  Patient is admitted for left total hip arthroplasty.  Subjective:  Chief Complaint: left hip pain  HPI: [name], 64 y.o. male, has a history of pain and functional disability in the left hip(s) due to arthritis and patient has failed non-surgical conservative treatments for greater than 12 weeks to include NSAID's and/or analgesics, corticosteriod injections, flexibility and strengthening excercises, use of assistive devices and activity modification.  Onset of symptoms was gradual starting 8 years ago with gradually worsening course since that time.The patient noted no past surgery on the left hip(s).  Patient currently rates pain in the left hip at 10 out of 10 with activity. Patient has night pain, worsening of pain with activity and weight bearing, pain that interfers with activities of daily living and pain with passive range of motion. Patient has evidence of joint space narrowing and degenerative labral tearing by imaging studies. This condition presents safety issues increasing the risk of falls.  There is no current active infection.  Patient Active Problem List   Diagnosis Date Noted  . Unilateral primary osteoarthritis, left hip 12/15/2019  . Cigarette smoker 12/17/2015  . S/P femoral-popliteal bypass surgery 07/16/2015  . Atherosclerosis of native arteries of the extremities with ulceration ([place]) 07/16/2015  . Aftercare following surgery of the circulatory system 07/16/2015  . Sleep apnea 04/27/2015  . Subacute osteomyelitis, left ankle and foot ([place]) 03/30/2015  . Diabetic foot ([place]) 03/30/2015  . Acute osteomyelitis of left foot ([place]) 03/16/2015  . Septic arthritis of left foot ([place]) 03/16/2015  . Dyslipidemia associated with type 2 diabetes mellitus ([place]) 03/16/2015  . Colon cancer screening 01/30/2015  . Left carotid bruit 12/11/2014  . PAD (peripheral artery disease) ([place]) 03/26/2014  . Hypertriglyceridemia 03/26/2014  . Osteoarthritis of  right hip 04/09/2012  . Encounter for long-term (current) use of other medications 10/16/2011  . Type 2 diabetes mellitus with vascular disease ([place]) 06/22/2009  . Hyperlipidemia with target LDL less than 100 04/16/2008  . Essential hypertension 04/16/2008  . CAD, NATIVE VESSEL 04/16/2008   Past Medical History:  Diagnosis Date  . Acute osteomyelitis of left foot ([place]) 03/16/2015  . Asthma    bronchitis to asthma many years  . CAD, NATIVE VESSEL 04/16/2008   Qualifier: Diagnosis of  By: Aundra Dubin, MD, [place]    . Cancer ([place])    non-hodgkins lymphoma  2000  recd radiation  . Cellulitis and abscess of leg, except foot 02/21/2015  . Colon cancer screening 01/30/2015  . Diabetic ulcer of left foot with necrosis of muscle ([place]) 02/21/2015  . Dyslipidemia associated with type 2 diabetes mellitus ([place]) 03/16/2015  . Encounter for long-term (current) use of other medications 10/16/2011  . Essential hypertension 04/16/2008   Qualifier: Diagnosis of  By: Aundra Dubin, MD, [place]    . Hyperlipidemia with target LDL less than 100 04/16/2008       . Hypertriglyceridemia 03/26/2014  . Left carotid bruit 12/11/2014  . Myocardial infarction ([place])    2009  . Osteoarthritis of right hip 04/09/2012  . PAD (peripheral artery disease) ([place]) 03/26/2014  . Peripheral vascular disease, unspecified ([place]) 11/17/2011  . Pneumonia    As a child  . Septic arthritis of left foot ([place]) 03/16/2015  . Type 2 diabetes mellitus with vascular disease ([place]) 06/22/2009   Qualifier: Diagnosis of  By: Karrie Meres RN, BSN, [place]    . Uncontrolled diabetes mellitus type 2 with peripheral artery disease ([place]) 03/16/2015    Past Surgical History:  Procedure  Laterality Date  . AMPUTATION Left 10/09/2017   Procedure: LEFT 5TH RAY AMPUTATION;  Surgeon: Newt Minion, MD;  Location: [facility];  Service: Orthopedics;  Laterality: Left;  . CARDIAC CATHETERIZATION     2011  . FEMORAL-TIBIAL BYPASS GRAFT Left 05/04/2015   Procedure: LEFT   FEMORAL-BELOW THE KNEE POPLITEAL  ARTERY BYPASS GRAFT USING NON REVERSE LEFT GREATER SAPHENOUS VEIN;  Surgeon: Serafina Mitchell, MD;  Location: [facility];  Service: Vascular;  Laterality: Left;  . INTRAOPERATIVE ARTERIOGRAM Left 05/04/2015   Procedure: INTRA OPERATIVE ARTERIOGRAM TIMES TWO;  Surgeon: Serafina Mitchell, MD;  Location: [facility];  Service: Vascular;  Laterality: Left;  . LEG SURGERY     on bone above ankle-not sure which bone left leg 1981  . LEG SURGERY  1981   Bone cyst- Left leg  . MULTIPLE EXTRACTIONS WITH ALVEOLOPLASTY  10/31/2011   Procedure: MULTIPLE EXTRACION WITH ALVEOLOPLASTY;  Surgeon: Lenn Cal, DDS;  Location: [facility];  Service: Oral Surgery;  Laterality: N/A;  Extraction of tooth #'s 2, 4,5,6,7,8,9,10,11,12,13,15,21,22,23,24,25,26,27,28, 29, and 31 with alveoloplasty  . PERIPHERAL VASCULAR CATHETERIZATION N/A 04/05/2015   Procedure: Abdominal Aortogram w/Lower Extremity;  Surgeon: Conrad Hebron, MD;  Location: [facility];  Service: Cardiovascular;  Laterality: N/A;  . PR VEIN BYPASS GRAFT,AORTO-FEM-POP    . TONSILLECTOMY      No current facility-administered medications for this encounter.   Current Outpatient Medications  Medication Sig Dispense Refill Last Dose  . cilostazol (PLETAL) 100 MG tablet Take 1 tablet (100 mg total) by mouth 2 (two) times daily. Please make yearly appt with Dr. Burt Knack for April. 1st attempt 180 tablet 0   . glimepiride (AMARYL) 1 MG tablet Take 1 mg by mouth daily with breakfast.     . Insulin Glargine (TOUJEO SOLOSTAR) 300 UNIT/ML SOPN Inject 30 Units into the skin daily.      . isosorbide mononitrate (IMDUR) 30 MG 24 hr tablet Take 1 tablet (30 mg total) by mouth 2 (two) times daily. Please make yearly appt with Dr. Burt Knack for July for future refills. 1st attempt 180 tablet 3   . Krill Oil 1000 MG CAPS Take 2,000 mg by mouth daily.      Marland Kitchen lisinopril (ZESTRIL) 40 MG tablet TAKE 1 TABLET EVERY DAY. KEEP MD APPOINTMENT FOR REFILLS  (Patient taking differently: Take 40 mg by mouth daily. ) 90 tablet 2   . metFORMIN (GLUCOPHAGE) 1000 MG tablet Take 1,000 mg by mouth 2 (two) times daily with a meal.     . metoprolol tartrate (LOPRESSOR) 50 MG tablet Take 1.5 tablets (75 mg total) by mouth 2 (two) times daily. (Patient taking differently: Take 50 mg by mouth daily. ) 270 tablet 3   . pravastatin (PRAVACHOL) 80 MG tablet Take 1 tablet (80 mg total) by mouth daily. 90 tablet 0   . aspirin 81 MG tablet Take 81 mg by mouth daily. (Patient not taking: Reported on 12/05/2019)   Not Taking at Unknown time  . clopidogrel (PLAVIX) 75 MG tablet TAKE 1 TABLET EVERY DAY (Patient not taking: Reported on 12/05/2019) 90 tablet 2 Not Taking at Unknown time  . glucose blood (COOL BLOOD GLUCOSE TEST STRIPS) test strip Use to check blood sugar twice a day  DX. E11.51 100 each 12   . Insulin Pen Needle (NOVOFINE) 32G X 6 MM MISC 1 Act by Does not apply route daily. Use 1 QD 100 each 3   . naproxen (NAPROSYN) 375 MG  tablet Take 375 mg by mouth daily as needed (arthritis pain).  (Patient not taking: Reported on 12/05/2019)   Not Taking at Unknown time  . rosuvastatin (CRESTOR) 5 MG tablet  (Patient not taking: Reported on 12/05/2019)   Not Taking at Unknown time  . TRUEPLUS LANCETS 28G MISC Use to test blood sugar twice a day. DX: E11.51 200 each 1    Allergies  Allergen Reactions  . Fenofibrate Other (See Comments)    Joint pain/cramping/burning   . Pioglitazone Swelling and Rash    Edema unspecified peripheral edema  . Crestor [Rosuvastatin]   . Chlorhexidine Gluconate Rash  . Orange Oil Rash  . Soap Other (See Comments)    States that most soaps make him break out/rash-he uses a sensitive skin soap and shampoos as well, has to use specific allergen free soap and shampoo    Social History   Tobacco Use  . Smoking status: Heavy Tobacco Smoker    Packs/day: 1.00    Years: 43.00    Pack years: 43.00    Types: Cigarettes  . Smokeless tobacco:  Never Used  . Tobacco comment: heavy 2nd hand exposue ongoing  Substance Use Topics  . Alcohol use: No    Alcohol/week: 0.0 standard drinks    Comment: former heavy user quit 2011    Family History  Problem Relation Age of Onset  . Heart attack Mother        73's  . Heart disease Mother        Heart disease before age 61  . Hypertension Mother   . Hyperlipidemia Mother   . Peripheral Artery Disease Father   . Hypertension Father   . Hyperlipidemia Father   . Peripheral vascular disease Father   . Heart attack Other        40's/40's     Review of Systems  Musculoskeletal: Positive for arthralgias.  All other systems reviewed and are negative.   Objective:  Physical Exam Vitals reviewed.  Constitutional:      Appearance: Normal appearance.  HENT:     Head: Normocephalic and atraumatic.  Eyes:     Extraocular Movements: Extraocular movements intact.     Pupils: Pupils are equal, round, and reactive to light.  Cardiovascular:     Rate and Rhythm: Normal rate.     Pulses: Normal pulses.  Pulmonary:     Effort: Pulmonary effort is normal.  Abdominal:     Palpations: Abdomen is soft.  Musculoskeletal:     Cervical back: Normal range of motion.     Left hip: Tenderness and bony tenderness present. Decreased range of motion. Decreased strength.  Neurological:     Mental Status: He is alert and oriented to person, place, and time.  Psychiatric:        Behavior: Behavior normal.     Vital signs in last 24 hours:    Labs:   Estimated body mass index is 27.29 kg/m as calculated from the following:   Height as of 12/13/19: 5\' 10"  (1.778 m).   Weight as of 12/13/19: 86.3 kg.   Imaging Review Plain radiographs demonstrate mild degenerative joint disease of the left hip(s). The bone quality appears to be good for age and reported activity level.      Assessment/Plan:  Osteoarthritis, left hip(s)  The patient history, physical examination, clinical judgement  of the provider and imaging studies are consistent with end stage degenerative joint disease of the left hip(s) and total hip arthroplasty is deemed  medically necessary. The treatment options including medical management, injection therapy, arthroscopy and arthroplasty were discussed at length. The risks and benefits of total hip arthroplasty were presented and reviewed. The risks due to aseptic loosening, infection, stiffness, dislocation/subluxation,  thromboembolic complications and other imponderables were discussed.  The patient acknowledged the explanation, agreed to proceed with the plan and consent was signed. Patient is being admitted for inpatient treatment for surgery, pain control, PT, OT, prophylactic antibiotics, VTE prophylaxis, progressive ambulation and ADL's and discharge planning.The patient is planning to be discharged home with home health services

## 2019-12-15 NOTE — Care Plan (Signed)
RNCM call to patient to discuss his upcoming Left Total Hip Arthroplasty with Dr. Ninfa Linden on 12/16/19. [name] is an Ortho bundle patient through [facility]. He is agreeable to case management. He lives with his spouse, who will be assisting after surgery. Plan is to discharge to his home. He has a FWW and states right now he uses a vanity to assist with getting on/off the toilet in the bathroom. CM recommended a 3in/BSC and he felt he didn't need at this time. Will order if he changes his mind. Anticipate HHPT will be needed after short hospital stay. Choice provided and referral made to [facility]. Will continue to follow for all questions/needs.

## 2019-12-16 ENCOUNTER — Encounter (HOSPITAL_COMMUNITY): Payer: Self-pay | Admitting: Orthopaedic Surgery

## 2019-12-16 ENCOUNTER — Ambulatory Visit (HOSPITAL_COMMUNITY): Payer: Medicare HMO | Admitting: Physician Assistant

## 2019-12-16 ENCOUNTER — Encounter (HOSPITAL_COMMUNITY)
Admission: RE | Disposition: A | Discharge status: Home / Self Care | Payer: Self-pay | Source: Home / Self Care | Attending: Orthopaedic Surgery

## 2019-12-16 ENCOUNTER — Observation Stay (HOSPITAL_COMMUNITY)
Admission: RE | Admit: 2019-12-16 | Discharge: 2019-12-17 | Disposition: A | Discharge status: Home / Self Care | Payer: Medicare HMO | Attending: Orthopaedic Surgery | Admitting: Orthopaedic Surgery

## 2019-12-16 ENCOUNTER — Other Ambulatory Visit: Payer: Self-pay

## 2019-12-16 ENCOUNTER — Observation Stay (HOSPITAL_COMMUNITY): Payer: Medicare HMO

## 2019-12-16 ENCOUNTER — Ambulatory Visit (HOSPITAL_COMMUNITY): Payer: Medicare HMO | Admitting: Certified Registered Nurse Anesthetist

## 2019-12-16 ENCOUNTER — Ambulatory Visit (HOSPITAL_COMMUNITY): Payer: Medicare HMO

## 2019-12-16 DIAGNOSIS — M1611 Unilateral primary osteoarthritis, right hip: Secondary | ICD-10-CM | POA: Diagnosis not present

## 2019-12-16 DIAGNOSIS — L97523 Non-pressure chronic ulcer of other part of left foot with necrosis of muscle: Secondary | ICD-10-CM | POA: Insufficient documentation

## 2019-12-16 DIAGNOSIS — M86172 Other acute osteomyelitis, left ankle and foot: Secondary | ICD-10-CM | POA: Insufficient documentation

## 2019-12-16 DIAGNOSIS — Z8572 Personal history of non-Hodgkin lymphomas: Secondary | ICD-10-CM | POA: Diagnosis not present

## 2019-12-16 DIAGNOSIS — E1165 Type 2 diabetes mellitus with hyperglycemia: Secondary | ICD-10-CM | POA: Insufficient documentation

## 2019-12-16 DIAGNOSIS — Z96642 Presence of left artificial hip joint: Secondary | ICD-10-CM

## 2019-12-16 DIAGNOSIS — Z923 Personal history of irradiation: Secondary | ICD-10-CM | POA: Insufficient documentation

## 2019-12-16 DIAGNOSIS — E785 Hyperlipidemia, unspecified: Secondary | ICD-10-CM | POA: Insufficient documentation

## 2019-12-16 DIAGNOSIS — I252 Old myocardial infarction: Secondary | ICD-10-CM | POA: Diagnosis not present

## 2019-12-16 DIAGNOSIS — J45909 Unspecified asthma, uncomplicated: Secondary | ICD-10-CM | POA: Insufficient documentation

## 2019-12-16 DIAGNOSIS — E1151 Type 2 diabetes mellitus with diabetic peripheral angiopathy without gangrene: Secondary | ICD-10-CM | POA: Diagnosis not present

## 2019-12-16 DIAGNOSIS — F17210 Nicotine dependence, cigarettes, uncomplicated: Secondary | ICD-10-CM | POA: Diagnosis not present

## 2019-12-16 DIAGNOSIS — Z794 Long term (current) use of insulin: Secondary | ICD-10-CM | POA: Insufficient documentation

## 2019-12-16 DIAGNOSIS — I2510 Atherosclerotic heart disease of native coronary artery without angina pectoris: Secondary | ICD-10-CM | POA: Insufficient documentation

## 2019-12-16 DIAGNOSIS — E1169 Type 2 diabetes mellitus with other specified complication: Secondary | ICD-10-CM | POA: Insufficient documentation

## 2019-12-16 DIAGNOSIS — Z79899 Other long term (current) drug therapy: Secondary | ICD-10-CM | POA: Diagnosis not present

## 2019-12-16 DIAGNOSIS — E11621 Type 2 diabetes mellitus with foot ulcer: Secondary | ICD-10-CM | POA: Insufficient documentation

## 2019-12-16 DIAGNOSIS — Z419 Encounter for procedure for purposes other than remedying health state, unspecified: Secondary | ICD-10-CM

## 2019-12-16 DIAGNOSIS — Z471 Aftercare following joint replacement surgery: Secondary | ICD-10-CM | POA: Diagnosis not present

## 2019-12-16 DIAGNOSIS — M1612 Unilateral primary osteoarthritis, left hip: Principal | ICD-10-CM

## 2019-12-16 DIAGNOSIS — I10 Essential (primary) hypertension: Secondary | ICD-10-CM | POA: Diagnosis not present

## 2019-12-16 HISTORY — PX: TOTAL HIP ARTHROPLASTY: SHX124

## 2019-12-16 LAB — TYPE AND SCREEN
ABO/RH(D): O POS
Antibody Screen: NEGATIVE

## 2019-12-16 LAB — GLUCOSE, CAPILLARY
Glucose-Capillary: 153 mg/dL — ABNORMAL HIGH (ref 70–99)
Glucose-Capillary: 122 mg/dL — ABNORMAL HIGH (ref 70–99)
Glucose-Capillary: 158 mg/dL — ABNORMAL HIGH (ref 70–99)
Glucose-Capillary: 199 mg/dL — ABNORMAL HIGH (ref 70–99)

## 2019-12-16 SURGERY — ARTHROPLASTY, HIP, TOTAL, ANTERIOR APPROACH
Anesthesia: Spinal | Site: Hip | Laterality: Left

## 2019-12-16 MED ORDER — SODIUM CHLORIDE 0.9 % IV SOLN: INTRAVENOUS | Status: DC

## 2019-12-16 MED ORDER — INSULIN ASPART 100 UNIT/ML ~~LOC~~ SOLN: 0.0000 [IU] | Freq: Every day | SUBCUTANEOUS | Status: DC

## 2019-12-16 MED ORDER — HYDROMORPHONE HCL 1 MG/ML IJ SOLN: 0.5000 mg | INTRAMUSCULAR | Status: DC | PRN

## 2019-12-16 MED ORDER — SODIUM CHLORIDE 0.9 % IR SOLN
Status: DC | PRN
  Administered 2019-12-16: 1000 mL

## 2019-12-16 MED ORDER — MENTHOL 3 MG MT LOZG: 1.0000 | LOZENGE | OROMUCOSAL | Status: DC | PRN

## 2019-12-16 MED ORDER — METOPROLOL TARTRATE 50 MG PO TABS
50.0000 mg | ORAL_TABLET | Freq: Every day | ORAL | Status: DC
  Administered 2019-12-17: 50 mg via ORAL
  Filled 2019-12-16: qty 1

## 2019-12-16 MED ORDER — METOCLOPRAMIDE HCL 5 MG PO TABS: 5.0000 mg | ORAL_TABLET | Freq: Three times a day (TID) | ORAL | Status: DC | PRN

## 2019-12-16 MED ORDER — ORAL CARE MOUTH RINSE: 15.0000 mL | Freq: Once | OROMUCOSAL | Status: AC

## 2019-12-16 MED ORDER — LACTATED RINGERS IV SOLN: INTRAVENOUS | Status: DC

## 2019-12-16 MED ORDER — OXYCODONE HCL 5 MG PO TABS: 10.0000 mg | ORAL_TABLET | ORAL | Status: DC | PRN

## 2019-12-16 MED ORDER — PROPOFOL 500 MG/50ML IV EMUL
INTRAVENOUS | Status: DC | PRN
  Administered 2019-12-16: 75 ug/kg/min via INTRAVENOUS

## 2019-12-16 MED ORDER — CLOPIDOGREL BISULFATE 75 MG PO TABS
75.0000 mg | ORAL_TABLET | Freq: Every day | ORAL | Status: DC
  Administered 2019-12-16 – 2019-12-17 (×2): 75 mg via ORAL
  Filled 2019-12-16 (×2): qty 1

## 2019-12-16 MED ORDER — LISINOPRIL 20 MG PO TABS
40.0000 mg | ORAL_TABLET | Freq: Every day | ORAL | Status: DC
  Administered 2019-12-16 – 2019-12-17 (×2): 40 mg via ORAL
  Filled 2019-12-16 (×2): qty 2

## 2019-12-16 MED ORDER — METOCLOPRAMIDE HCL 5 MG/ML IJ SOLN: 5.0000 mg | Freq: Three times a day (TID) | INTRAMUSCULAR | Status: DC | PRN

## 2019-12-16 MED ORDER — ISOSORBIDE MONONITRATE ER 30 MG PO TB24
30.0000 mg | ORAL_TABLET | Freq: Two times a day (BID) | ORAL | Status: DC
  Administered 2019-12-16 – 2019-12-17 (×2): 30 mg via ORAL
  Filled 2019-12-16 (×2): qty 1

## 2019-12-16 MED ORDER — LIDOCAINE 2% (20 MG/ML) 5 ML SYRINGE
INTRAMUSCULAR | Status: DC | PRN
Start: 2019-12-16 — End: 2019-12-16
  Administered 2019-12-16: 40 mg via INTRAVENOUS

## 2019-12-16 MED ORDER — STERILE WATER FOR IRRIGATION IR SOLN
Status: DC | PRN
  Administered 2019-12-16: 2000 mL

## 2019-12-16 MED ORDER — DEXAMETHASONE SODIUM PHOSPHATE 10 MG/ML IJ SOLN
INTRAMUSCULAR | Status: DC | PRN
Start: 2019-12-16 — End: 2019-12-16
  Administered 2019-12-16: 5 mg via INTRAVENOUS

## 2019-12-16 MED ORDER — PHENYLEPHRINE HCL (PRESSORS) 10 MG/ML IV SOLN
INTRAVENOUS | Status: AC
  Filled 2019-12-16: qty 1

## 2019-12-16 MED ORDER — ONDANSETRON HCL 4 MG/2ML IJ SOLN
INTRAMUSCULAR | Status: DC | PRN
  Administered 2019-12-16: 4 mg via INTRAVENOUS

## 2019-12-16 MED ORDER — PROPOFOL 10 MG/ML IV BOLUS
INTRAVENOUS | Status: DC | PRN
  Administered 2019-12-16 (×2): 20 mg via INTRAVENOUS

## 2019-12-16 MED ORDER — PHENYLEPHRINE 40 MCG/ML (10ML) SYRINGE FOR IV PUSH (FOR BLOOD PRESSURE SUPPORT)
PREFILLED_SYRINGE | INTRAVENOUS | Status: DC | PRN
  Administered 2019-12-16: 120 ug via INTRAVENOUS
  Administered 2019-12-16 (×2): 80 ug via INTRAVENOUS
  Administered 2019-12-16: 120 ug via INTRAVENOUS

## 2019-12-16 MED ORDER — ONDANSETRON HCL 4 MG/2ML IJ SOLN: 4.0000 mg | Freq: Four times a day (QID) | INTRAMUSCULAR | Status: DC | PRN

## 2019-12-16 MED ORDER — ALUM & MAG HYDROXIDE-SIMETH 200-200-20 MG/5ML PO SUSP: 30.0000 mL | ORAL | Status: DC | PRN

## 2019-12-16 MED ORDER — CEFAZOLIN SODIUM-DEXTROSE 1-4 GM/50ML-% IV SOLN
1.0000 g | Freq: Four times a day (QID) | INTRAVENOUS | Status: AC
  Administered 2019-12-16 (×2): 1 g via INTRAVENOUS
  Filled 2019-12-16 (×2): qty 50

## 2019-12-16 MED ORDER — 0.9 % SODIUM CHLORIDE (POUR BTL) OPTIME
TOPICAL | Status: DC | PRN
  Administered 2019-12-16: 1000 mL

## 2019-12-16 MED ORDER — POVIDONE-IODINE 10 % EX SWAB: 2.0000 "application " | Freq: Once | CUTANEOUS | Status: DC

## 2019-12-16 MED ORDER — PANTOPRAZOLE SODIUM 40 MG PO TBEC
40.0000 mg | DELAYED_RELEASE_TABLET | Freq: Every day | ORAL | Status: DC
  Administered 2019-12-16 – 2019-12-17 (×2): 40 mg via ORAL
  Filled 2019-12-16 (×2): qty 1

## 2019-12-16 MED ORDER — INSULIN ASPART 100 UNIT/ML ~~LOC~~ SOLN
0.0000 [IU] | Freq: Three times a day (TID) | SUBCUTANEOUS | Status: DC
  Administered 2019-12-16: 3 [IU] via SUBCUTANEOUS
  Administered 2019-12-17: 2 [IU] via SUBCUTANEOUS

## 2019-12-16 MED ORDER — PHENOL 1.4 % MT LIQD: 1.0000 | OROMUCOSAL | Status: DC | PRN

## 2019-12-16 MED ORDER — ACETAMINOPHEN 325 MG PO TABS
325.0000 mg | ORAL_TABLET | Freq: Four times a day (QID) | ORAL | Status: DC | PRN
  Administered 2019-12-17: 650 mg via ORAL
  Filled 2019-12-16: qty 2

## 2019-12-16 MED ORDER — PRAVASTATIN SODIUM 20 MG PO TABS: 80.0000 mg | ORAL_TABLET | Freq: Every day | ORAL | Status: DC

## 2019-12-16 MED ORDER — INSULIN GLARGINE 100 UNIT/ML ~~LOC~~ SOLN
30.0000 [IU] | Freq: Every day | SUBCUTANEOUS | Status: DC
  Administered 2019-12-16: 30 [IU] via SUBCUTANEOUS
  Filled 2019-12-16 (×2): qty 0.3

## 2019-12-16 MED ORDER — CHLORHEXIDINE GLUCONATE 0.12 % MT SOLN
15.0000 mL | Freq: Once | OROMUCOSAL | Status: AC
  Administered 2019-12-16: 15 mL via OROMUCOSAL

## 2019-12-16 MED ORDER — LIDOCAINE 2% (20 MG/ML) 5 ML SYRINGE
INTRAMUSCULAR | Status: AC
  Filled 2019-12-16: qty 5

## 2019-12-16 MED ORDER — MIDAZOLAM HCL 2 MG/2ML IJ SOLN
INTRAMUSCULAR | Status: DC | PRN
  Administered 2019-12-16: 2 mg via INTRAVENOUS

## 2019-12-16 MED ORDER — FENTANYL CITRATE (PF) 100 MCG/2ML IJ SOLN: 25.0000 ug | INTRAMUSCULAR | Status: DC | PRN

## 2019-12-16 MED ORDER — ONDANSETRON HCL 4 MG/2ML IJ SOLN
INTRAMUSCULAR | Status: AC
  Filled 2019-12-16: qty 2

## 2019-12-16 MED ORDER — PHENYLEPHRINE HCL-NACL 10-0.9 MG/250ML-% IV SOLN
INTRAVENOUS | Status: DC | PRN
Start: 2019-12-16 — End: 2019-12-16
  Administered 2019-12-16: 25 ug/min via INTRAVENOUS

## 2019-12-16 MED ORDER — PROPOFOL 10 MG/ML IV BOLUS
INTRAVENOUS | Status: AC
  Filled 2019-12-16: qty 20

## 2019-12-16 MED ORDER — GLIMEPIRIDE 1 MG PO TABS
1.0000 mg | ORAL_TABLET | Freq: Every day | ORAL | Status: DC
  Administered 2019-12-17: 1 mg via ORAL
  Filled 2019-12-16: qty 1

## 2019-12-16 MED ORDER — ALBUMIN HUMAN 5 % IV SOLN
INTRAVENOUS | Status: AC
  Filled 2019-12-16: qty 250

## 2019-12-16 MED ORDER — MIDAZOLAM HCL 2 MG/2ML IJ SOLN
INTRAMUSCULAR | Status: AC
  Filled 2019-12-16: qty 2

## 2019-12-16 MED ORDER — FENTANYL CITRATE (PF) 100 MCG/2ML IJ SOLN
INTRAMUSCULAR | Status: DC | PRN
  Administered 2019-12-16 (×2): 50 ug via INTRAVENOUS

## 2019-12-16 MED ORDER — ALBUMIN HUMAN 5 % IV SOLN: 12.5000 g | Freq: Once | INTRAVENOUS | Status: DC

## 2019-12-16 MED ORDER — CEFAZOLIN SODIUM-DEXTROSE 2-4 GM/100ML-% IV SOLN
2.0000 g | INTRAVENOUS | Status: AC
  Administered 2019-12-16: 2 g via INTRAVENOUS
  Filled 2019-12-16: qty 100

## 2019-12-16 MED ORDER — OXYCODONE HCL 5 MG PO TABS
5.0000 mg | ORAL_TABLET | ORAL | Status: DC | PRN
  Administered 2019-12-16: 5 mg via ORAL
  Administered 2019-12-17: 10 mg via ORAL
  Filled 2019-12-16: qty 2
  Filled 2019-12-16: qty 1

## 2019-12-16 MED ORDER — PROPOFOL 1000 MG/100ML IV EMUL
INTRAVENOUS | Status: AC
  Filled 2019-12-16: qty 100

## 2019-12-16 MED ORDER — DOCUSATE SODIUM 100 MG PO CAPS
100.0000 mg | ORAL_CAPSULE | Freq: Two times a day (BID) | ORAL | Status: DC
  Administered 2019-12-16 – 2019-12-17 (×2): 100 mg via ORAL
  Filled 2019-12-16 (×2): qty 1

## 2019-12-16 MED ORDER — METFORMIN HCL 500 MG PO TABS
1000.0000 mg | ORAL_TABLET | Freq: Two times a day (BID) | ORAL | Status: DC
  Administered 2019-12-16 – 2019-12-17 (×2): 1000 mg via ORAL
  Filled 2019-12-16 (×2): qty 2

## 2019-12-16 MED ORDER — BUPIVACAINE IN DEXTROSE 0.75-8.25 % IT SOLN
INTRATHECAL | Status: DC | PRN
Start: 2019-12-16 — End: 2019-12-16
  Administered 2019-12-16: 1.8 mL via INTRATHECAL

## 2019-12-16 MED ORDER — ASPIRIN 81 MG PO CHEW
81.0000 mg | CHEWABLE_TABLET | Freq: Every day | ORAL | Status: DC
  Administered 2019-12-17: 81 mg via ORAL
  Filled 2019-12-16: qty 1

## 2019-12-16 MED ORDER — DEXAMETHASONE SODIUM PHOSPHATE 10 MG/ML IJ SOLN
INTRAMUSCULAR | Status: AC
  Filled 2019-12-16: qty 1

## 2019-12-16 MED ORDER — TRANEXAMIC ACID-NACL 1000-0.7 MG/100ML-% IV SOLN
1000.0000 mg | INTRAVENOUS | Status: AC
  Administered 2019-12-16: 1000 mg via INTRAVENOUS
  Filled 2019-12-16: qty 100

## 2019-12-16 MED ORDER — FENTANYL CITRATE (PF) 100 MCG/2ML IJ SOLN
INTRAMUSCULAR | Status: AC
  Filled 2019-12-16: qty 2

## 2019-12-16 MED ORDER — GABAPENTIN 100 MG PO CAPS
100.0000 mg | ORAL_CAPSULE | Freq: Three times a day (TID) | ORAL | Status: DC
  Administered 2019-12-16 – 2019-12-17 (×3): 100 mg via ORAL
  Filled 2019-12-16 (×3): qty 1

## 2019-12-16 MED ORDER — ONDANSETRON HCL 4 MG PO TABS: 4.0000 mg | ORAL_TABLET | Freq: Four times a day (QID) | ORAL | Status: DC | PRN

## 2019-12-16 MED ORDER — PHENYLEPHRINE 40 MCG/ML (10ML) SYRINGE FOR IV PUSH (FOR BLOOD PRESSURE SUPPORT)
PREFILLED_SYRINGE | INTRAVENOUS | Status: AC
  Filled 2019-12-16: qty 10

## 2019-12-16 MED ORDER — ACETAMINOPHEN 500 MG PO TABS
1000.0000 mg | ORAL_TABLET | Freq: Once | ORAL | Status: AC
  Administered 2019-12-16: 1000 mg via ORAL
  Filled 2019-12-16: qty 2

## 2019-12-16 MED ORDER — ONDANSETRON HCL 4 MG/2ML IJ SOLN: 4.0000 mg | Freq: Once | INTRAMUSCULAR | Status: DC | PRN

## 2019-12-16 SURGICAL SUPPLY — 41 items
BAG ZIPLOCK 12X15 (MISCELLANEOUS) IMPLANT
BENZOIN TINCTURE PRP APPL 2/3 (GAUZE/BANDAGES/DRESSINGS) IMPLANT
BLADE SAW SGTL 18X1.27X75 (BLADE) ×2 IMPLANT
BLADE SAW SGTL 18X1.27X75MM (BLADE) ×1
CLOSURE WOUND 1/2 X4 (GAUZE/BANDAGES/DRESSINGS)
COVER PERINEAL POST (MISCELLANEOUS) ×3 IMPLANT
COVER SURGICAL LIGHT HANDLE (MISCELLANEOUS) ×3 IMPLANT
COVER WAND RF STERILE (DRAPES) ×3 IMPLANT
CUP ACET PNNCL SECTR W/GRIP 56 (Hips) ×1 IMPLANT
DRAPE STERI IOBAN 125X83 (DRAPES) ×3 IMPLANT
DRAPE U-SHAPE 47X51 STRL (DRAPES) ×6 IMPLANT
DRSG AQUACEL AG ADV 3.5X10 (GAUZE/BANDAGES/DRESSINGS) ×3 IMPLANT
DURAPREP 26ML APPLICATOR (WOUND CARE) ×3 IMPLANT
ELECT REM PT RETURN 15FT ADLT (MISCELLANEOUS) ×3 IMPLANT
GAUZE XEROFORM 1X8 LF (GAUZE/BANDAGES/DRESSINGS) ×3 IMPLANT
GLOVE BIO SURGEON STRL SZ7.5 (GLOVE) ×3 IMPLANT
GLOVE BIOGEL PI IND STRL 8 (GLOVE) ×2 IMPLANT
GLOVE BIOGEL PI INDICATOR 8 (GLOVE) ×4
GLOVE ECLIPSE 8.0 STRL XLNG CF (GLOVE) ×3 IMPLANT
GOWN STRL REUS W/TWL XL LVL3 (GOWN DISPOSABLE) ×6 IMPLANT
HANDPIECE INTERPULSE COAX TIP (DISPOSABLE) ×3
HEAD M SROM 36MM PLUS 1.5 (Hips) ×1 IMPLANT
HOLDER FOLEY CATH W/STRAP (MISCELLANEOUS) ×3 IMPLANT
KIT TURNOVER KIT A (KITS) IMPLANT
LINER NEUTRAL 52MMX36MMX56N (Liner) ×3 IMPLANT
PACK ANTERIOR HIP CUSTOM (KITS) ×3 IMPLANT
PENCIL SMOKE EVACUATOR (MISCELLANEOUS) IMPLANT
PINN SECTOR W/GRIP ACE CUP 56 (Hips) ×3 IMPLANT
SET HNDPC FAN SPRY TIP SCT (DISPOSABLE) ×1 IMPLANT
SROM M HEAD 36MM PLUS 1.5 (Hips) ×3 IMPLANT
STAPLER VISISTAT 35W (STAPLE) IMPLANT
STEM CORAIL KA12 (Stem) ×3 IMPLANT
STRIP CLOSURE SKIN 1/2X4 (GAUZE/BANDAGES/DRESSINGS) IMPLANT
SUT ETHIBOND NAB CT1 #1 30IN (SUTURE) ×3 IMPLANT
SUT ETHILON 2 0 PS N (SUTURE) IMPLANT
SUT MNCRL AB 4-0 PS2 18 (SUTURE) IMPLANT
SUT VIC AB 0 CT1 36 (SUTURE) ×3 IMPLANT
SUT VIC AB 1 CT1 36 (SUTURE) ×3 IMPLANT
SUT VIC AB 2-0 CT1 27 (SUTURE) ×6
SUT VIC AB 2-0 CT1 TAPERPNT 27 (SUTURE) ×2 IMPLANT
TRAY FOLEY MTR SLVR 16FR STAT (SET/KITS/TRAYS/PACK) IMPLANT

## 2019-12-16 NOTE — Anesthesia Postprocedure Evaluation (Signed)
Anesthesia Post Note  Patient: [name]  Procedure(s) Performed: LEFT TOTAL HIP ARTHROPLASTY ANTERIOR APPROACH (Left Hip)     Patient location during evaluation: PACU Anesthesia Type: Spinal Level of consciousness: oriented, awake and alert and awake Pain management: pain level controlled Vital Signs Assessment: post-procedure vital signs reviewed and stable Respiratory status: spontaneous breathing, respiratory function stable, patient connected to nasal cannula oxygen and nonlabored ventilation Cardiovascular status: blood pressure returned to baseline and stable Postop Assessment: no headache, no backache, no apparent nausea or vomiting, spinal receding and patient able to bend at knees Anesthetic complications: no   No complications documented.  Last Vitals:  Vitals:   12/16/19 1500 12/16/19 1515  BP: 102/61 108/61  Pulse: (!) 58 (!) 57  Resp: 17 16  Temp:    SpO2: 97% 97%    Last Pain:  Vitals:   12/16/19 1430  TempSrc:   PainSc: 0-No pain                 Catalina Gravel

## 2019-12-16 NOTE — Anesthesia Preprocedure Evaluation (Addendum)
Anesthesia Evaluation  Patient identified by MRN, date of birth, ID band Patient awake    Reviewed: Allergy & Precautions, NPO status , Patient's Chart, lab work & pertinent test results, reviewed documented beta blocker date and time   Airway Mallampati: II  TM Distance: >3 FB Neck ROM: Full    Dental  (+) Edentulous Upper, Edentulous Lower   Pulmonary asthma , sleep apnea , Current Smoker and Patient abstained from smoking.,    Pulmonary exam normal breath sounds clear to auscultation       Cardiovascular hypertension, Pt. on home beta blockers and Pt. on medications (-) angina+ CAD, + Past MI and + Peripheral Vascular Disease  (-) Cardiac Stents and (-) CABG Normal cardiovascular exam Rhythm:Regular Rate:Normal  Echo 09/08/16: Study Conclusions   - Left ventricle: The cavity size was normal. Wall thickness was  increased in a pattern of mild LVH. There was mild focal basal  hypertrophy of the septum. Systolic function was mildly to  moderately reduced. The estimated ejection fraction was in the  range of 40% to 45%. There is hypokinesis of the mid-apical  septal and apical myocardium. There is hypokinesis of the  inferolateral myocardium. Doppler parameters are consistent with  abnormal left ventricular relaxation (grade 1 diastolic  dysfunction). Doppler parameters are consistent with high  ventricular filling pressure.  - Left atrium: The atrium was mildly dilated.    Neuro/Psych  Neuromuscular disease    GI/Hepatic negative GI ROS, Neg liver ROS,   Endo/Other  diabetes, Poorly Controlled, Type 2, Insulin Dependent  Renal/GU negative Renal ROS     Musculoskeletal  (+) Arthritis ,   Abdominal   Peds  Hematology  (+) Blood dyscrasia (Plavix), , Plt 188k    Anesthesia Other Findings Day of surgery medications reviewed with the patient.  Reproductive/Obstetrics                             Anesthesia Physical Anesthesia Plan  ASA: III  Anesthesia Plan: Spinal   Post-op Pain Management:    Induction: Intravenous  PONV Risk Score and Plan: 0 and Propofol infusion, Treatment may vary due to age or medical condition, Dexamethasone, Midazolam and Ondansetron  Airway Management Planned: Natural Airway and Nasal Cannula  Additional Equipment:   Intra-op Plan:   Post-operative Plan:   Informed Consent: I have reviewed the patients History and Physical, chart, labs and discussed the procedure including the risks, benefits and alternatives for the proposed anesthesia with the patient or authorized representative who has indicated his/her understanding and acceptance.       Plan Discussed with: CRNA, Anesthesiologist and Surgeon  Anesthesia Plan Comments:         Anesthesia Quick Evaluation

## 2019-12-16 NOTE — Brief Op Note (Signed)
12/16/2019  1:21 PM  PATIENT:  [name]  64 y.o. male  PRE-OPERATIVE DIAGNOSIS:  left hip osteoarthritis  POST-OPERATIVE DIAGNOSIS:  left hip osteoarthritis  PROCEDURE:  Procedure(s): LEFT TOTAL HIP ARTHROPLASTY ANTERIOR APPROACH (Left)  SURGEON:  Surgeon(s) and Role:    [name], MD - Primary  PHYSICIAN ASSISTANT:  Benita Stabile, PA-C  ANESTHESIA:   spinal  EBL:  150 mL   COUNTS:  YES  DICTATION: .Other Dictation: Dictation Number [id number]  PLAN OF CARE: Admit for overnight observation  PATIENT DISPOSITION:  PACU - hemodynamically stable.   Delay start of Pharmacological VTE agent (>24hrs) due to surgical blood loss or risk of bleeding: no

## 2019-12-16 NOTE — Transfer of Care (Signed)
Immediate Anesthesia Transfer of Care Note  Patient: [name]  Procedure(s) Performed: LEFT TOTAL HIP ARTHROPLASTY ANTERIOR APPROACH (Left Hip)  Patient Location: PACU  Anesthesia Type:Spinal  Level of Consciousness: awake, alert , oriented and patient cooperative  Airway & Oxygen Therapy: Patient Spontanous Breathing and Patient connected to face mask oxygen  Post-op Assessment: Report given to RN, Post -op Vital signs reviewed and stable and Post -op Vital signs reviewed and unstable, Anesthesiologist notified--MDA notified d/t BP. Patient awake but slightly drowsy. Patient answering all questions appropriately. MDA made aware of BP and fluid status. Albumin ordered in PACU to be given by PACU RN. MDA at bedside in PACU.  Post vital signs: Reviewed and stable  Last Vitals:  Vitals Value Taken Time  BP 88/55 12/16/19 1338  Temp    Pulse 57 12/16/19 1343  Resp 18 12/16/19 1343  SpO2 98 % 12/16/19 1343  Vitals shown include unvalidated device data.  Last Pain:  Vitals:   12/16/19 1021  TempSrc: Oral         Complications: No complications documented.

## 2019-12-16 NOTE — Evaluation (Signed)
Physical Therapy Evaluation Patient Details Name: [name] MRN: [id number] DOB: 06/08/1955 Today's Date: 12/16/2019   History of Present Illness  Pt s/p L THR and with hx of DM, PAD, PVD, MI, CAD, L fem/pop bypass, and L foot osteomyelitis s/p 5th ray amputation  Clinical Impression  Pt s/p L THR and presents with decreased L LE strength/ROM and post op pain limiting functional mobility.  Pt should progress to dc home with family assist and HHPT follow up.    Follow Up Recommendations Home health PT;Follow surgeon's recommendation for DC plan and follow-up therapies    Equipment Recommendations  None recommended by PT    Recommendations for Other Services       Precautions / Restrictions Precautions Precautions: Fall Restrictions Weight Bearing Restrictions: No Other Position/Activity Restrictions: WBAT      Mobility  Bed Mobility Overal bed mobility: Needs Assistance Bed Mobility: Supine to Sit     Supine to sit: Min assist     General bed mobility comments: cues for sequence and use of R LE to self assist  Transfers Overall transfer level: Needs assistance Equipment used: Rolling walker (2 wheeled) Transfers: Sit to/from Stand Sit to Stand: Min assist         General transfer comment: cues for LE management and use of UEs to self assist  Ambulation/Gait Ambulation/Gait assistance: Min assist;Min guard Gait Distance (Feet): 111 Feet Assistive device: Rolling walker (2 wheeled) Gait Pattern/deviations: Step-to pattern;Step-through pattern;Decreased step length - right;Decreased step length - left;Shuffle;Trunk flexed Gait velocity: decr   General Gait Details: cues for posture, position from RW and initial sequence  Stairs            Wheelchair Mobility    Modified Rankin (Stroke Patients Only)       Balance Overall balance assessment: Mild deficits observed, not formally tested                                            Pertinent Vitals/Pain Pain Assessment: 0-10    Home Living Family/patient expects to be discharged to:: Private residence Living Arrangements: Spouse/significant other Available Help at Discharge: Family Type of Home: House Home Access: Stairs to enter   Technical brewer of Steps: 1 Home Layout: Two level;Able to live on main level with bedroom/bathroom (shower is up stairs) Home Equipment: Walker - 2 wheels;Cane - single point;Crutches      Prior Function Level of Independence: Independent         Comments: used cane for distance     Hand Dominance        Extremity/Trunk Assessment   Upper Extremity Assessment Upper Extremity Assessment: Overall WFL for tasks assessed    Lower Extremity Assessment Lower Extremity Assessment: LLE deficits/detail    Cervical / Trunk Assessment Cervical / Trunk Assessment: Normal  Communication   Communication: No difficulties  Cognition Arousal/Alertness: Awake/alert Behavior During Therapy: WFL for tasks assessed/performed Overall Cognitive Status: Within Functional Limits for tasks assessed                                        General Comments      Exercises Total Joint Exercises Ankle Circles/Pumps: AROM;Both;15 reps;Supine   Assessment/Plan    PT Assessment Patient needs continued PT services  PT Problem List  Decreased strength;Decreased range of motion;Decreased activity tolerance;Decreased balance;Decreased mobility;Decreased knowledge of use of DME;Pain       PT Treatment Interventions DME instruction;Gait training;Stair training;Functional mobility training;Therapeutic activities;Therapeutic exercise;Patient/family education    PT Goals (Current goals can be found in the Care Plan section)  Acute Rehab PT Goals Patient Stated Goal: Recover and get the other hip done PT Goal Formulation: With patient Time For Goal Achievement: 12/23/19 Potential to Achieve Goals: Good     Frequency 7X/week   Barriers to discharge        Co-evaluation               AM-PAC PT "6 Clicks" Mobility  Outcome Measure Help needed turning from your back to your side while in a flat bed without using bedrails?: A Little Help needed moving from lying on your back to sitting on the side of a flat bed without using bedrails?: A Little Help needed moving to and from a bed to a chair (including a wheelchair)?: A Little Help needed standing up from a chair using your arms (e.g., wheelchair or bedside chair)?: A Little Help needed to walk in hospital room?: A Little Help needed climbing 3-5 steps with a railing? : A Little 6 Click Score: 18    End of Session Equipment Utilized During Treatment: Gait belt Activity Tolerance: Patient tolerated treatment well Patient left: in chair;with call bell/phone within reach;with chair alarm set Nurse Communication: Mobility status PT Visit Diagnosis: Difficulty in walking, not elsewhere classified (R26.2)    Time: 1856-3149 PT Time Calculation (min) (ACUTE ONLY): 37 min   Charges:   PT Evaluation $PT Eval Low Complexity: 1 Low PT Treatments $Gait Training: 8-22 mins        [place] Pager [phone] Office [phone]   , 12/16/2019, 7:29 PM

## 2019-12-16 NOTE — Interval H&P Note (Signed)
History and Physical Interval Note: The patient understands that is here today for a left total hip arthroplasty to treat the pain from osteoarthritis of the left hip.  There is been no acute changes in medical status.  See recent H&P.  The risk and benefits of surgery been discussed in detail and informed consent is obtained.  12/16/2019 10:59 AM  [name]  has presented today for surgery, with the diagnosis of left hip osteoarthritis.  The various methods of treatment have been discussed with the patient and family. After consideration of risks, benefits and other options for treatment, the patient has consented to  Procedure(s): LEFT TOTAL HIP ARTHROPLASTY ANTERIOR APPROACH (Left) as a surgical intervention.  The patient's history has been reviewed, patient examined, no change in status, stable for surgery.  I have reviewed the patient's chart and labs.  Questions were answered to the patient's satisfaction.     [name]

## 2019-12-16 NOTE — Plan of Care (Signed)
Monitoring patient and using appropriate interventions to decrease pain. Deanna Artis RN

## 2019-12-16 NOTE — Care Plan (Signed)
Ortho Bundle Case Management Note  Patient Details  Name: TILMON WISEHART MRN: [id number] Date of Birth: [birth year]    [facility] call to patient for pre-op call to discuss his upcoming Left Total Hip Arthroplasty with Dr. Ninfa Linden on 12/16/19. Mr. Whitehorn is an Ortho bundle patient through [facility]. He is agreeable to case management. He lives with his spouse, who will be assisting after surgery. Plan is to discharge to his home. He has a FWW and states right now he uses a vanity to assist with getting on/off the toilet in the bathroom. CM recommended a 3in/BSC and he felt he didn't need at this time. Will order if he changes his mind. Anticipate HHPT will be needed after short hospital stay. Choice provided and referral made to [facility]. Will continue to follow for all questions/needs.               DME Arranged:   (Patient verbalized he has a FWW at home; declined 3in1/BSC at this time) DME Agency:     [place]:  PT [place]:  [facility] (now [facility])  Additional Comments: Please contact me with any questions of if this plan should need to change.  Jamse Arn, RN, BSN, [employer]  [phone] 12/16/2019, 10:05 AM

## 2019-12-16 NOTE — Anesthesia Procedure Notes (Signed)
Spinal  Patient location during procedure: OR Start time: 12/16/2019 12:02 PM End time: 12/16/2019 12:06 PM Staffing Performed: resident/CRNA  Anesthesiologist: Catalina Gravel, MD Resident/CRNA: Raenette Rover, CRNA Preanesthetic Checklist Completed: patient identified, IV checked, site marked, risks and benefits discussed, surgical consent, monitors and equipment checked, pre-op evaluation and timeout performed Spinal Block Patient position: sitting Prep: DuraPrep Patient monitoring: heart rate, continuous pulse ox and blood pressure Approach: midline Location: L3-4 Injection technique: single-shot Needle Needle type: Pencan  Needle gauge: 24 G Assessment Sensory level: T6

## 2019-12-16 NOTE — Op Note (Signed)
NAME: [name], [name] MEDICAL RECORD [id number] ACCOUNT [id number] DATE OF BIRTH:12/10/1955 FACILITY: [facility] LOCATION: [id number] PHYSICIAN: Kerry Fort, MD  OPERATIVE REPORT  DATE OF PROCEDURE:  12/16/2019  PREOPERATIVE DIAGNOSIS:  Primary osteoarthritis and degenerative joint disease, left hip.  POSTOPERATIVE DIAGNOSIS:  Primary osteoarthritis and degenerative joint disease, left hip.  PROCEDURE:  Left total hip arthroplasty through direct anterior approach.  IMPLANTS:  DePuy Sector Gription acetabular component size 56, size 36+0 neutral polyethylene liner, size 12 Corail femoral component with standard offset, size 36+1.5 metal hip ball.  SURGEON:  Lind Guest.  Ninfa Linden, MD  ASSISTANT:  Erskine Emery, PA-C.  ANESTHESIA:  Spinal.  ANTIBIOTICS:  Two g IV Ancef.  ESTIMATED BLOOD LOSS:  150 mL.  COMPLICATIONS:  None.  INDICATIONS:  The patient is a 64 year old active individual who has osteoarthritis involving his left hip.  He has tried and failed all forms of conservative treatment, including multiple steroid injections where each provided relief, but then did not.   At this point, he does wish to proceed with a total hip arthroplasty given the severity of his hip pain.  His hip is very stiff on rotation, but the x-rays do not show severe arthritis in the hip.  We are going on the fact that he has had relief from  multiple injections and an MRI did show some arthritis in the hip as well.  His pain is daily and it is debilitating.  He states that it is 10/10.  He understands with surgery there is the risk of acute blood loss anemia, nerve or vessel injury,  fracture, infection, dislocation, DVT and implant failure.  He understands our goals are to decrease pain, improve mobility and overall improve quality of life.  DESCRIPTION OF PROCEDURE:  After informed consent was obtained, the appropriate left hip was marked.  He was brought to the operating room and sat up on  the stretcher where spinal anesthesia was obtained.  He was laid in supine position on a stretcher.   Foley catheter was placed.  I was able to assess his leg lengths and then placed traction boots on both his feet.  Next, he was placed supine on the Hana fracture table with a perineal post in place and both legs in line skeletal traction device and no  traction applied.  His left operative hip was prepped and draped with DuraPrep and sterile drapes.  A time-out was called.  He was identified, correct patient, correct left hip.  I then made an incision just inferior and posterior to the anterior iliac   spine and carried this obliquely down the leg.  I dissected down to the tensor fascia lata muscle.  Tensor fascia was then divided longitudinally to proceed with direct anterior approach to the hip.  We identified and cauterized circumflex vessels and  identified the hip capsule, opened up the hip capsule in an L-type format, finding a moderate joint effusion.  We then placed Cobra retractors around the medial and lateral femoral neck and made our femoral neck cut with an oscillating saw just proximal  to the lesser trochanter and completed this with an osteotome and placed a corkscrew guide in the femoral head and removed the femoral head in its entirety.  I then placed a bent Hohmann over the medial acetabular rim and removed remnants of acetabular  labrum and other debris.  I then began reaming under direct visualization from a size 44 reamer in stepwise increments, going up to a size 55.  With  all reamers under direct visualization, the last reamer was placed under direct fluoroscopy so we could  obtain our depth of reaming, our inclination and anteversion.  I then placed the real DePuy Sector Gription acetabular component size 56 and a 36+0 neutral polyethylene liner for that size acetabular component.  Attention was then turned to the femur.   With the leg externally rotated to 120 degrees, extended and  adducted, we are to place a Mueller retractor medially and a Hohmann retractor around the greater trochanter, released the lateral joint capsule and used a box-cutting osteotome to enter the  femoral canal and a rongeur to lateralize.  We then began broaching using the Corail broaching system from a size 8 going up to a size 12.  With a size 12 in place, we trialed a standard offset femoral neck and 36+1.5 trial hip ball and reduced this in  the acetabulum.  We were pleased with the leg length, offset, range of motion and stability assessed mechanically and radiographically.  We then dislocated the hip and removed the trial components.  We placed the real Corail femoral component size 12  with standard offset and the real 36+1.5 metal hip ball and again reduced this in the acetabulum and it was stable.  We then irrigated the soft tissue with normal saline solution using pulse lavage.  I closed the joint capsule with interrupted #1  Ethibond suture, followed by 0 Vicryl to close deep tissue, 2-0 Vicryl to close the subcutaneous tissue.  Staples reapproximated the skin.  Xeroform well-padded sterile dressing was applied.  He was taken to recovery room in stable condition.  All final  counts were correct.  There were no complications noted.  Of note, Benita Stabile, PA-C, assisted during the entire case.  His assistance was crucial for facilitating all aspects of this case.  VN/NUANCE  D:12/16/2019 T:12/16/2019 JOB:[id number]

## 2019-12-17 DIAGNOSIS — J45909 Unspecified asthma, uncomplicated: Secondary | ICD-10-CM | POA: Diagnosis not present

## 2019-12-17 DIAGNOSIS — L97523 Non-pressure chronic ulcer of other part of left foot with necrosis of muscle: Secondary | ICD-10-CM | POA: Diagnosis not present

## 2019-12-17 DIAGNOSIS — I2510 Atherosclerotic heart disease of native coronary artery without angina pectoris: Secondary | ICD-10-CM | POA: Diagnosis not present

## 2019-12-17 DIAGNOSIS — M1612 Unilateral primary osteoarthritis, left hip: Secondary | ICD-10-CM | POA: Diagnosis not present

## 2019-12-17 DIAGNOSIS — E785 Hyperlipidemia, unspecified: Secondary | ICD-10-CM | POA: Diagnosis not present

## 2019-12-17 DIAGNOSIS — E1169 Type 2 diabetes mellitus with other specified complication: Secondary | ICD-10-CM | POA: Diagnosis not present

## 2019-12-17 DIAGNOSIS — E1151 Type 2 diabetes mellitus with diabetic peripheral angiopathy without gangrene: Secondary | ICD-10-CM | POA: Diagnosis not present

## 2019-12-17 DIAGNOSIS — E11621 Type 2 diabetes mellitus with foot ulcer: Secondary | ICD-10-CM | POA: Diagnosis not present

## 2019-12-17 DIAGNOSIS — I10 Essential (primary) hypertension: Secondary | ICD-10-CM | POA: Diagnosis not present

## 2019-12-17 LAB — BASIC METABOLIC PANEL
Anion gap: 7 (ref 5–15)
BUN: 21 mg/dL (ref 8–23)
CO2: 22 mmol/L (ref 22–32)
Calcium: 7.9 mg/dL — ABNORMAL LOW (ref 8.9–10.3)
Chloride: 101 mmol/L (ref 98–111)
Creatinine, Ser: 1.1 mg/dL (ref 0.61–1.24)
GFR calc Af Amer: >60 mL/min (ref >60)
GFR calc non Af Amer: >60 mL/min (ref >60)
Glucose, Bld: 135 mg/dL — ABNORMAL HIGH (ref 70–99)
Potassium: 4.5 mmol/L (ref 3.5–5.1)
Sodium: 130 mmol/L — ABNORMAL LOW (ref 135–145)

## 2019-12-17 LAB — GLUCOSE, CAPILLARY
Glucose-Capillary: 134 mg/dL — ABNORMAL HIGH (ref 70–99)
Glucose-Capillary: 154 mg/dL — ABNORMAL HIGH (ref 70–99)

## 2019-12-17 LAB — CBC
HCT: 39.9 % (ref 39.0–52.0)
Hemoglobin: 14 g/dL (ref 13.0–17.0)
MCH: 34.4 pg — ABNORMAL HIGH (ref 26.0–34.0)
MCHC: 35.1 g/dL (ref 30.0–36.0)
MCV: 98 fL (ref 80.0–100.0)
Platelets: 162 10*3/uL (ref 150–400)
RBC: 4.07 MIL/uL — ABNORMAL LOW (ref 4.22–5.81)
RDW: 13.4 % (ref 11.5–15.5)
WBC: 10.6 10*3/uL — ABNORMAL HIGH (ref 4.0–10.5)
nRBC: 0 % (ref 0.0–0.2)

## 2019-12-17 MED ORDER — ASPIRIN 81 MG PO CHEW: 81.0000 mg | CHEWABLE_TABLET | Freq: Every day | ORAL | 0 refills | Status: AC

## 2019-12-17 MED ORDER — METHOCARBAMOL 500 MG PO TABS: 500.0000 mg | ORAL_TABLET | Freq: Four times a day (QID) | ORAL | 1 refills | Status: AC | PRN

## 2019-12-17 MED ORDER — OXYCODONE HCL 5 MG PO TABS: 5.0000 mg | ORAL_TABLET | ORAL | 0 refills | Status: AC | PRN

## 2019-12-17 NOTE — Discharge Summary (Signed)
Patient ID: [name] MRN: [id number] DOB/AGE: Jun 09, 1955 64 y.o.  Admit date: 12/16/2019 Discharge date: 12/17/2019  Admission Diagnoses:  Principal Problem:   Unilateral primary osteoarthritis, left hip Active Problems:   Status post total replacement of left hip   Discharge Diagnoses:  Same  Past Medical History:  Diagnosis Date  . Acute osteomyelitis of left foot ([place]) 03/16/2015  . Asthma    bronchitis to asthma many years  . CAD, NATIVE VESSEL 04/16/2008   Qualifier: Diagnosis of  By: Aundra Dubin, MD, [place]    . Cancer ([place])    non-hodgkins lymphoma  2000  recd radiation  . Cellulitis and abscess of leg, except foot 02/21/2015  . Colon cancer screening 01/30/2015  . Diabetic ulcer of left foot with necrosis of muscle ([place]) 02/21/2015  . Dyslipidemia associated with type 2 diabetes mellitus ([place]) 03/16/2015  . Encounter for long-term (current) use of other medications 10/16/2011  . Essential hypertension 04/16/2008   Qualifier: Diagnosis of  By: Aundra Dubin, MD, [place]    . Hyperlipidemia with target LDL less than 100 04/16/2008       . Hypertriglyceridemia 03/26/2014  . Left carotid bruit 12/11/2014  . Myocardial infarction ([place])    2009  . Osteoarthritis of right hip 04/09/2012  . PAD (peripheral artery disease) ([place]) 03/26/2014  . Peripheral vascular disease, unspecified ([place]) 11/17/2011  . Pneumonia    As a child  . Septic arthritis of left foot ([place]) 03/16/2015  . Type 2 diabetes mellitus with vascular disease ([place]) 06/22/2009   Qualifier: Diagnosis of  By: Karrie Meres RN, BSN, [place]    . Uncontrolled diabetes mellitus type 2 with peripheral artery disease ([place]) 03/16/2015    Surgeries: Procedure(s): LEFT TOTAL HIP ARTHROPLASTY ANTERIOR APPROACH on 12/16/2019   Consultants:   Discharged Condition: Improved  Hospital Course: RONELL DUFFUS is an 64 y.o. male who was admitted 12/16/2019 for operative treatment ofUnilateral primary osteoarthritis, left hip. Patient has  severe unremitting pain that affects sleep, daily activities, and work/hobbies. After pre-op clearance the patient was taken to the operating room on 12/16/2019 and underwent  Procedure(s): LEFT TOTAL HIP ARTHROPLASTY ANTERIOR APPROACH.    Patient was given perioperative antibiotics:  Anti-infectives (From admission, onward)   Start     Dose/Rate Route Frequency Ordered Stop   12/16/19 1800  ceFAZolin (ANCEF) IVPB 1 g/50 mL premix        1 g 100 mL/hr over 30 Minutes Intravenous Every 6 hours 12/16/19 1535 12/16/19 2338   12/16/19 0945  ceFAZolin (ANCEF) IVPB 2g/100 mL premix        2 g 200 mL/hr over 30 Minutes Intravenous On call to O.R. 12/16/19 0942 12/16/19 1210       Patient was given sequential compression devices, early ambulation, and chemoprophylaxis to prevent DVT.  Patient benefited maximally from hospital stay and there were no complications.    Recent vital signs:  Patient Vitals for the past 24 hrs:  BP Temp Temp src Pulse Resp SpO2 Height Weight  12/17/19 1015 129/75 98.5 F (36.9 C) Oral 73 16 95 % -- --  12/17/19 0511 122/60 98.4 F (36.9 C) Oral 76 16 92 % -- --  12/17/19 0217 129/61 98.7 F (37.1 C) Oral 77 16 96 % -- --  12/16/19 2022 (!) 146/69 99.2 F (37.3 C) Oral 85 18 96 % -- --  12/16/19 1813 (!) 146/73 -- -- 76 16 95 % -- --  12/16/19 1730 (!) 141/76 98 F (36.7 C) Axillary 76  16 94 % -- --  12/16/19 1647 121/66 98.7 F (37.1 C) Axillary 64 16 96 % -- --  12/16/19 1552 (!) 113/56 97.8 F (36.6 C) -- 61 16 -- 5\' 10"  (1.778 m) 86.2 kg  12/16/19 1531 (!) 113/56 97.8 F (36.6 C) Oral 61 16 97 % -- --  12/16/19 1515 108/61 -- -- (!) 57 16 97 % -- --  12/16/19 1500 102/61 -- -- (!) 58 17 97 % -- --  12/16/19 1445 (!) 101/57 -- -- (!) 56 16 96 % -- --  12/16/19 1430 101/61 -- -- (!) 57 16 99 % -- --  12/16/19 1415 (!) 94/57 -- -- (!) 56 16 97 % -- --  12/16/19 1400 94/62 -- -- (!) 55 17 100 % -- --  12/16/19 1345 92/62 -- -- (!) 58 (!) 21 100 % -- --   12/16/19 1338 (!) 88/55 -- -- (!) 58 19 97 % -- --  12/16/19 1336 (!) 88/48 97.8 F (36.6 C) -- (!) 57 20 95 % -- --     Recent laboratory studies:  Recent Labs    12/17/19 0252  WBC 10.6*  HGB 14.0  HCT 39.9  PLT 162  NA 130*  K 4.5  CL 101  CO2 22  BUN 21  CREATININE 1.10  GLUCOSE 135*  CALCIUM 7.9*     Discharge Medications:   Allergies as of 12/17/2019      Reactions   Fenofibrate Other (See Comments)   Joint pain/cramping/burning   Pioglitazone Swelling, Rash   Edema unspecified peripheral edema   Crestor [rosuvastatin]    Chlorhexidine Gluconate Rash   Orange Oil Rash   Soap Other (See Comments)   States that most soaps make him break out/rash-he uses a sensitive skin soap and shampoos as well, has to use specific allergen free soap and shampoo      Medication List    TAKE these medications   aspirin 81 MG chewable tablet Chew 1 tablet (81 mg total) by mouth daily. Start taking on: December 18, 2019   cilostazol 100 MG tablet Commonly known as: PLETAL Take 1 tablet (100 mg total) by mouth 2 (two) times daily. Please make yearly appt with Dr. Burt Knack for April. 1st attempt   clopidogrel 75 MG tablet Commonly known as: PLAVIX TAKE 1 TABLET EVERY DAY   glimepiride 1 MG tablet Commonly known as: AMARYL Take 1 mg by mouth daily with breakfast.   glucose blood test strip Commonly known as: Cool Blood Glucose Test Strips Use to check blood sugar twice a day  DX. E11.51   Insulin Pen Needle 32G X 6 MM Misc Commonly known as: NovoFine 1 Act by Does not apply route daily. Use 1 QD   isosorbide mononitrate 30 MG 24 hr tablet Commonly known as: IMDUR Take 1 tablet (30 mg total) by mouth 2 (two) times daily. Please make yearly appt with Dr. Burt Knack for July for future refills. 1st attempt   Krill Oil 1000 MG Caps Take 2,000 mg by mouth daily.   lisinopril 40 MG tablet Commonly known as: ZESTRIL TAKE 1 TABLET EVERY DAY. KEEP MD APPOINTMENT FOR  REFILLS What changed: See the new instructions.   metFORMIN 1000 MG tablet Commonly known as: GLUCOPHAGE Take 1,000 mg by mouth 2 (two) times daily with a meal.   methocarbamol 500 MG tablet Commonly known as: Robaxin Take 1 tablet (500 mg total) by mouth every 6 (six) hours as needed for muscle spasms.  metoprolol tartrate 50 MG tablet Commonly known as: LOPRESSOR Take 1.5 tablets (75 mg total) by mouth 2 (two) times daily. What changed:   how much to take  when to take this   oxyCODONE 5 MG immediate release tablet Commonly known as: Oxy IR/ROXICODONE Take 1-2 tablets (5-10 mg total) by mouth every 4 (four) hours as needed for moderate pain (pain score 4-6).   pravastatin 80 MG tablet Commonly known as: PRAVACHOL Take 1 tablet (80 mg total) by mouth daily.   Toujeo SoloStar 300 UNIT/ML Sopn Generic drug: Insulin Glargine Inject 30 Units into the skin daily.   TRUEplus Lancets 28G Misc Use to test blood sugar twice a day. DX: E11.51            Durable Medical Equipment  (From admission, onward)         Start     Ordered   12/16/19 1536  DME 3 n 1  Once        12/16/19 1535   12/16/19 1536  DME Walker rolling  Once       Question Answer Comment  Walker: With 5 Inch Wheels   Patient needs a walker to treat with the following condition Status post total replacement of left hip      12/16/19 1535          Diagnostic Studies: DG Pelvis Portable  Result Date: 12/16/2019 CLINICAL DATA:  Status post total hip replacement on the left EXAM: PORTABLE PELVIS 1-2 VIEWS COMPARISON:  Intraoperative left hip images December 16, 2019; pelvis and left hip images October 24, 2019 FINDINGS: Frontal pelvis image obtained. There is a total hip replacement on the left with prosthetic components on the left well-seated on frontal view. No fracture or dislocation. There is mild narrowing of the right hip joint. There is stable bony overgrowth along the right lateral iliac bone,  likely of posttraumatic etiology. IMPRESSION: Total hip replacement on the left with prosthetic components well-seated on frontal view. No acute fracture or dislocation. Stable narrowing right hip joint. Bony overgrowth along the lateral right iliac bone, stable, and likely indicative of prior trauma. Electronically Signed   By: Lowella Grip III M.D.   On: 12/16/2019 14:41   DG C-Arm 1-60 Min-No Report  Result Date: 12/16/2019 Fluoroscopy was utilized by the requesting physician.  No radiographic interpretation.   DG HIP OPERATIVE UNILAT W OR W/O PELVIS LEFT  Result Date: 12/16/2019 CLINICAL DATA:  Left hip replacement EXAM: OPERATIVE LEFT HIP WITH PELVIS COMPARISON:  None. FLUOROSCOPY TIME:  Radiation Exposure Index (as provided by the fluoroscopic device): 2.41 mGy If the device does not provide the exposure index: Fluoroscopy Time:  12 seconds Number of Acquired Images:  3 FINDINGS: Left hip prosthesis is noted in satisfactory position. No acute bony or soft tissue abnormality is noted. IMPRESSION: Status post left hip replacement. Electronically Signed   By: Inez Catalina M.D.   On: 12/16/2019 14:02    Disposition: Discharge disposition: 01-Home or Self Care          Follow-up Information    [name], MD. Go on 12/29/2019.   Specialty: Orthopedic Surgery Why: at 1:45 pm for your 2 week post-op appointment in our office. Contact information: [place] [address] [phone]        Home, [facility] Follow up.   Specialty: Home Health Services Why: Someone from the home health agency will contact you after discharge to arrange your first in home therapy  visit. Contact information: [address] [place] [address] [phone]                Signed: [name] 12/17/2019, 10:39 AM

## 2019-12-17 NOTE — Progress Notes (Signed)
Physical Therapy Treatment Patient Details Name: [name] MRN: [id number] DOB: [birth year] Today's Date: 12/17/2019    History of Present Illness Pt s/p L THR and with hx of DM, PAD, PVD, MI, CAD, L fem/pop bypass, and L foot osteomyelitis s/p 5th ray amputation    PT Comments    Pt progressing well with mobility and hopeful for dc home this pm.   Follow Up Recommendations  Home health PT;Follow surgeon's recommendation for DC plan and follow-up therapies     Equipment Recommendations  None recommended by PT    Recommendations for Other Services       Precautions / Restrictions Precautions Precautions: Fall Restrictions Weight Bearing Restrictions: No Other Position/Activity Restrictions: WBAT    Mobility  Bed Mobility Overal bed mobility: Needs Assistance Bed Mobility: Supine to Sit     Supine to sit: Min guard     General bed mobility comments: cues for sequence and use of R LE to self assist  Transfers Overall transfer level: Needs assistance Equipment used: Rolling walker (2 wheeled) Transfers: Sit to/from Stand Sit to Stand: Min guard         General transfer comment: cues for LE management and use of UEs to self assist  Ambulation/Gait Ambulation/Gait assistance: Min guard Gait Distance (Feet): 170 Feet Assistive device: Rolling walker (2 wheeled) Gait Pattern/deviations: Step-to pattern;Step-through pattern;Decreased step length - right;Decreased step length - left;Shuffle;Trunk flexed Gait velocity: decr   General Gait Details: cues for posture, position from RW and initial sequence   Stairs             Wheelchair Mobility    Modified Rankin (Stroke Patients Only)       Balance Overall balance assessment: Mild deficits observed, not formally tested                                          Cognition Arousal/Alertness: Awake/alert Behavior During Therapy: WFL for tasks assessed/performed Overall Cognitive  Status: Within Functional Limits for tasks assessed                                        Exercises Total Joint Exercises Ankle Circles/Pumps: AROM;Both;15 reps;Supine Quad Sets: AROM;Both;10 reps;Supine Heel Slides: AAROM;Left;20 reps;Supine Hip ABduction/ADduction: AAROM;Left;10 reps;Supine    General Comments        Pertinent Vitals/Pain Pain Assessment: 0-10 Pain Score: 3  Pain Location: L hip Pain Descriptors / Indicators: Aching;Sore Pain Intervention(s): Limited activity within patient's tolerance;Monitored during session;Premedicated before session;Ice applied    Home Living                      Prior Function            PT Goals (current goals can now be found in the care plan section) Acute Rehab PT Goals Patient Stated Goal: Recover and get the other hip done PT Goal Formulation: With patient Time For Goal Achievement: 12/23/19 Potential to Achieve Goals: Good Progress towards PT goals: Progressing toward goals    Frequency    7X/week      PT Plan Current plan remains appropriate    Co-evaluation              AM-PAC PT "6 Clicks" Mobility   Outcome Measure  Help needed turning  from your back to your side while in a flat bed without using bedrails?: A Little Help needed moving from lying on your back to sitting on the side of a flat bed without using bedrails?: A Little Help needed moving to and from a bed to a chair (including a wheelchair)?: A Little Help needed standing up from a chair using your arms (e.g., wheelchair or bedside chair)?: A Little Help needed to walk in hospital room?: A Little Help needed climbing 3-5 steps with a railing? : A Little 6 Click Score: 18    End of Session Equipment Utilized During Treatment: Gait belt Activity Tolerance: Patient tolerated treatment well Patient left: in chair;with call bell/phone within reach;with chair alarm set Nurse Communication: Mobility status PT Visit  Diagnosis: Difficulty in walking, not elsewhere classified (R26.2)     Time: 0824-0900 PT Time Calculation (min) (ACUTE ONLY): 36 min  Charges:  $Gait Training: 8-22 mins $Therapeutic Exercise: 8-22 mins                     [place] Pager [phone] Office [phone]    , 12/17/2019, 12:35 PM

## 2019-12-17 NOTE — Discharge Instructions (Signed)

## 2019-12-17 NOTE — TOC Initial Note (Signed)
Transition of Care [facility]) - Initial/Assessment Note    Patient Details  Name: [name] MRN: [id number] Date of Birth: [birth year]  Transition of Care (TOC) CM/SW Contact:    Joaquin Courts, RN Phone Number: 12/17/2019, 10:40 AM  Clinical Narrative:   CM spoke with patient at bedside, patient set up with [facility] for [place].  Patient reports he has rolling walker and declines 3in1.                 Expected Discharge Plan: [place] Barriers to Discharge: Continued Medical Work up   Patient Goals and CMS Choice Patient states their goals for this hospitalization and ongoing recovery are:: to go home with therapy CMS Medicare.gov Compare Post Acute Care list provided to:: Patient Choice offered to / list presented to : Patient  Expected Discharge Plan and Services Expected Discharge Plan: [place]   Discharge Planning Services: CM Consult Post Acute Care Choice: [place] arrangements for the past 2 months: Single Family Home Expected Discharge Date: 12/17/19               DME Arranged: N/A DME Agency: NA       HH Arranged: PT HH Agency: [facility] [employer])     Representative spoke with at [place]: pre-arranged in MD office  Prior Living Arrangements/Services Living arrangements for the past 2 months: [place]   Patient language and need for interpreter reviewed:: Yes Do you feel safe going back to the place where you live?: Yes      Need for Family Participation in Patient Care: Yes (Comment) Care giver support system in place?: Yes (comment)   Criminal Activity/Legal Involvement Pertinent to Current Situation/Hospitalization: No - Comment as needed  Activities of Daily Living Home Assistive Devices/Equipment: Cane (specify quad or straight), Walker (specify type), Blood pressure cuff, CBG Meter, Grab bars in shower, Hand-held shower hose ADL Screening (condition at time of  admission) Patient's cognitive ability adequate to safely complete daily activities?: Yes Is the patient deaf or have difficulty hearing?: No Does the patient have difficulty seeing, even when wearing glasses/contacts?: No Does the patient have difficulty concentrating, remembering, or making decisions?: No Patient able to express need for assistance with ADLs?: Yes Does the patient have difficulty dressing or bathing?: No Independently performs ADLs?: Yes (appropriate for developmental age) Does the patient have difficulty walking or climbing stairs?: Yes Weakness of Legs: None Weakness of Arms/Hands: None  Permission Sought/Granted                  Emotional Assessment Appearance:: Appears stated age Attitude/Demeanor/Rapport: Engaged Affect (typically observed): Accepting Orientation: : Oriented to Self, Oriented to Place, Oriented to  Time, Oriented to Situation   Psych Involvement: No (comment)  Admission diagnosis:  Status post total replacement of left hip [Z96.642] Patient Active Problem List   Diagnosis Date Noted  . Status post total replacement of left hip 12/16/2019  . Unilateral primary osteoarthritis, left hip 12/15/2019  . Cigarette smoker 12/17/2015  . S/P femoral-popliteal bypass surgery 07/16/2015  . Atherosclerosis of native arteries of the extremities with ulceration ([place]) 07/16/2015  . Aftercare following surgery of the circulatory system 07/16/2015  . Sleep apnea 04/27/2015  . Subacute osteomyelitis, left ankle and foot ([place]) 03/30/2015  . Diabetic foot ([place]) 03/30/2015  . Acute osteomyelitis of left foot ([place]) 03/16/2015  . Septic arthritis of left foot ([place]) 03/16/2015  . Dyslipidemia  associated with type 2 diabetes mellitus ([place]) 03/16/2015  . Colon cancer screening 01/30/2015  . Left carotid bruit 12/11/2014  . PAD (peripheral artery disease) ([place]) 03/26/2014  . Hypertriglyceridemia 03/26/2014  . Osteoarthritis of right hip 04/09/2012  .  Encounter for long-term (current) use of other medications 10/16/2011  . Type 2 diabetes mellitus with vascular disease ([place]) 06/22/2009  . Hyperlipidemia with target LDL less than 100 04/16/2008  . Essential hypertension 04/16/2008  . CAD, NATIVE VESSEL 04/16/2008   PCP:  Josetta Huddle, MD Pharmacy:   [facility], [place] [address] Phone: [phone] Fax: [phone]  [place]), [place] - [address] [place])  [address] Phone: [phone] Fax: [phone]     Social Determinants of Health (SDOH) Interventions    Readmission Risk Interventions No flowsheet data found.

## 2019-12-17 NOTE — Progress Notes (Signed)
Subjective: 1 Day Post-Op Procedure(s) (LRB): LEFT TOTAL HIP ARTHROPLASTY ANTERIOR APPROACH (Left) Patient reports pain as moderate.  Working well with PT on mobility Objective: Vital signs in last 24 hours: Temp:  [97.8 F (36.6 C)-99.2 F (37.3 C)] 98.5 F (36.9 C) (08/14 1015) Pulse Rate:  [55-85] 73 (08/14 1015) Resp:  [16-21] 16 (08/14 1015) BP: (88-146)/(48-76) 129/75 (08/14 1015) SpO2:  [92 %-100 %] 95 % (08/14 1015) Weight:  [86.2 kg] 86.2 kg (08/13 1552)  Intake/Output from previous day: 08/13 0701 - 08/14 0700 In: 2714.8 [P.O.:720; I.V.:1794.8; IV Piggyback:200] Out: 2500 [Urine:2350; Blood:150] Intake/Output this shift: No intake/output data recorded.  Recent Labs    12/17/19 0252  HGB 14.0   Recent Labs    12/17/19 0252  WBC 10.6*  RBC 4.07*  HCT 39.9  PLT 162   Recent Labs    12/17/19 0252  NA 130*  K 4.5  CL 101  CO2 22  BUN 21  CREATININE 1.10  GLUCOSE 135*  CALCIUM 7.9*   No results for input(s): LABPT, INR in the last 72 hours.  Sensation intact distally Intact pulses distally Dorsiflexion/Plantar flexion intact Incision: scant drainage   Assessment/Plan: 1 Day Post-Op Procedure(s) (LRB): LEFT TOTAL HIP ARTHROPLASTY ANTERIOR APPROACH (Left) Up with therapy Discharge home with home health    Patient's anticipated LOS is less than 2 midnights, meeting these requirements: - Younger than 36 - Lives within 1 hour of care - Has a competent adult at home to recover with post-op recover - NO history of  - Chronic pain requiring opiods  - Diabetes  - Coronary Artery Disease  - Heart failure  - Heart attack  - Stroke  - DVT/VTE  - Cardiac arrhythmia  - Respiratory Failure/COPD  - Renal failure  - Anemia  - Advanced Liver disease       [name] 12/17/2019, 10:37 AM

## 2019-12-17 NOTE — Progress Notes (Signed)
Physical Therapy Treatment Patient Details Name: [name] MRN: [id number] DOB: [birth year] Today's Date: 12/17/2019    History of Present Illness Pt s/p L THR and with hx of DM, PAD, PVD, MI, CAD, L fem/pop bypass, and L foot osteomyelitis s/p 5th ray amputation    PT Comments    Pt continues to progress well with mobility and eager for dc home.  Pt reviewed stairs and car transfers with spouse present.  Follow Up Recommendations  Home health PT;Follow surgeon's recommendation for DC plan and follow-up therapies     Equipment Recommendations  None recommended by PT    Recommendations for Other Services       Precautions / Restrictions Precautions Precautions: Fall Restrictions Weight Bearing Restrictions: No Other Position/Activity Restrictions: WBAT    Mobility  Bed Mobility Overal bed mobility: Needs Assistance Bed Mobility: Supine to Sit     Supine to sit: Min guard     General bed mobility comments: Pt up in chair and requests back to same  Transfers Overall transfer level: Needs assistance Equipment used: Rolling walker (2 wheeled) Transfers: Sit to/from Stand Sit to Stand: Min guard;Supervision         General transfer comment: cues for LE management and use of UEs to self assist  Ambulation/Gait Ambulation/Gait assistance: Min guard;Supervision Gait Distance (Feet): 100 Feet Assistive device: Rolling walker (2 wheeled) Gait Pattern/deviations: Step-to pattern;Step-through pattern;Decreased step length - right;Decreased step length - left;Shuffle;Trunk flexed Gait velocity: decr   General Gait Details: cues for posture, position from RW and initial sequence   Stairs Stairs: Yes Stairs assistance: Min guard Stair Management: No rails;Step to pattern;Forwards;With walker Number of Stairs: 3 General stair comments: single step x 3 with cues for sequence   Wheelchair Mobility    Modified Rankin (Stroke Patients Only)       Balance  Overall balance assessment: Mild deficits observed, not formally tested                                          Cognition Arousal/Alertness: Awake/alert Behavior During Therapy: WFL for tasks assessed/performed Overall Cognitive Status: Within Functional Limits for tasks assessed                                        Exercises Total Joint Exercises Ankle Circles/Pumps: AROM;Both;15 reps;Supine Quad Sets: AROM;Both;10 reps;Supine Heel Slides: AAROM;Left;20 reps;Supine Hip ABduction/ADduction: AAROM;Left;10 reps;Supine    General Comments        Pertinent Vitals/Pain Pain Assessment: 0-10 Pain Score: 3  Pain Location: L hip Pain Descriptors / Indicators: Aching;Sore Pain Intervention(s): Limited activity within patient's tolerance;Monitored during session;Premedicated before session    Home Living                      Prior Function            PT Goals (current goals can now be found in the care plan section) Acute Rehab PT Goals Patient Stated Goal: Recover and get the other hip done PT Goal Formulation: With patient Time For Goal Achievement: 12/23/19 Potential to Achieve Goals: Good Progress towards PT goals: Progressing toward goals    Frequency    7X/week      PT Plan Current plan remains appropriate    Co-evaluation  AM-PAC PT "6 Clicks" Mobility   Outcome Measure  Help needed turning from your back to your side while in a flat bed without using bedrails?: A Little Help needed moving from lying on your back to sitting on the side of a flat bed without using bedrails?: A Little Help needed moving to and from a bed to a chair (including a wheelchair)?: A Little Help needed standing up from a chair using your arms (e.g., wheelchair or bedside chair)?: A Little Help needed to walk in hospital room?: A Little Help needed climbing 3-5 steps with a railing? : A Little 6 Click Score: 18    End  of Session Equipment Utilized During Treatment: Gait belt Activity Tolerance: Patient tolerated treatment well Patient left: in chair;with call bell/phone within reach;with chair alarm set Nurse Communication: Mobility status PT Visit Diagnosis: Difficulty in walking, not elsewhere classified (R26.2)     Time: 6222-9798 PT Time Calculation (min) (ACUTE ONLY): 18 min  Charges:  $Gait Training: 8-22 mins $Therapeutic Exercise: 8-22 mins                     [place] Pager [phone] Office [phone]    , 12/17/2019, 12:38 PM

## 2019-12-18 DIAGNOSIS — I10 Essential (primary) hypertension: Secondary | ICD-10-CM | POA: Diagnosis not present

## 2019-12-18 DIAGNOSIS — E1151 Type 2 diabetes mellitus with diabetic peripheral angiopathy without gangrene: Secondary | ICD-10-CM | POA: Diagnosis not present

## 2019-12-18 DIAGNOSIS — I252 Old myocardial infarction: Secondary | ICD-10-CM | POA: Diagnosis not present

## 2019-12-18 DIAGNOSIS — E785 Hyperlipidemia, unspecified: Secondary | ICD-10-CM | POA: Diagnosis not present

## 2019-12-18 DIAGNOSIS — I2510 Atherosclerotic heart disease of native coronary artery without angina pectoris: Secondary | ICD-10-CM | POA: Diagnosis not present

## 2019-12-18 DIAGNOSIS — J45909 Unspecified asthma, uncomplicated: Secondary | ICD-10-CM | POA: Diagnosis not present

## 2019-12-18 DIAGNOSIS — E1169 Type 2 diabetes mellitus with other specified complication: Secondary | ICD-10-CM | POA: Diagnosis not present

## 2019-12-18 DIAGNOSIS — Z471 Aftercare following joint replacement surgery: Secondary | ICD-10-CM | POA: Diagnosis not present

## 2019-12-18 DIAGNOSIS — M1611 Unilateral primary osteoarthritis, right hip: Secondary | ICD-10-CM | POA: Diagnosis not present

## 2019-12-19 ENCOUNTER — Encounter (HOSPITAL_COMMUNITY): Payer: Self-pay | Admitting: Orthopaedic Surgery

## 2019-12-19 ENCOUNTER — Telehealth: Payer: Self-pay | Admitting: *Deleted

## 2019-12-19 NOTE — Telephone Encounter (Signed)
Ortho bundle D/C call completed. 

## 2019-12-20 DIAGNOSIS — J45909 Unspecified asthma, uncomplicated: Secondary | ICD-10-CM | POA: Diagnosis not present

## 2019-12-20 DIAGNOSIS — M1611 Unilateral primary osteoarthritis, right hip: Secondary | ICD-10-CM | POA: Diagnosis not present

## 2019-12-20 DIAGNOSIS — I252 Old myocardial infarction: Secondary | ICD-10-CM | POA: Diagnosis not present

## 2019-12-20 DIAGNOSIS — E1151 Type 2 diabetes mellitus with diabetic peripheral angiopathy without gangrene: Secondary | ICD-10-CM | POA: Diagnosis not present

## 2019-12-20 DIAGNOSIS — E1169 Type 2 diabetes mellitus with other specified complication: Secondary | ICD-10-CM | POA: Diagnosis not present

## 2019-12-20 DIAGNOSIS — I10 Essential (primary) hypertension: Secondary | ICD-10-CM | POA: Diagnosis not present

## 2019-12-20 DIAGNOSIS — Z471 Aftercare following joint replacement surgery: Secondary | ICD-10-CM | POA: Diagnosis not present

## 2019-12-20 DIAGNOSIS — E785 Hyperlipidemia, unspecified: Secondary | ICD-10-CM | POA: Diagnosis not present

## 2019-12-20 DIAGNOSIS — I2510 Atherosclerotic heart disease of native coronary artery without angina pectoris: Secondary | ICD-10-CM | POA: Diagnosis not present

## 2019-12-22 DIAGNOSIS — J45909 Unspecified asthma, uncomplicated: Secondary | ICD-10-CM | POA: Diagnosis not present

## 2019-12-22 DIAGNOSIS — M1611 Unilateral primary osteoarthritis, right hip: Secondary | ICD-10-CM | POA: Diagnosis not present

## 2019-12-22 DIAGNOSIS — E785 Hyperlipidemia, unspecified: Secondary | ICD-10-CM | POA: Diagnosis not present

## 2019-12-22 DIAGNOSIS — I252 Old myocardial infarction: Secondary | ICD-10-CM | POA: Diagnosis not present

## 2019-12-22 DIAGNOSIS — I10 Essential (primary) hypertension: Secondary | ICD-10-CM | POA: Diagnosis not present

## 2019-12-22 DIAGNOSIS — Z471 Aftercare following joint replacement surgery: Secondary | ICD-10-CM | POA: Diagnosis not present

## 2019-12-22 DIAGNOSIS — E1169 Type 2 diabetes mellitus with other specified complication: Secondary | ICD-10-CM | POA: Diagnosis not present

## 2019-12-22 DIAGNOSIS — I2510 Atherosclerotic heart disease of native coronary artery without angina pectoris: Secondary | ICD-10-CM | POA: Diagnosis not present

## 2019-12-22 DIAGNOSIS — E1151 Type 2 diabetes mellitus with diabetic peripheral angiopathy without gangrene: Secondary | ICD-10-CM | POA: Diagnosis not present

## 2019-12-23 ENCOUNTER — Telehealth: Payer: Self-pay | Admitting: *Deleted

## 2019-12-23 NOTE — Telephone Encounter (Signed)
Ortho bundle 7 day call completed. 

## 2019-12-27 ENCOUNTER — Telehealth: Payer: Self-pay | Admitting: *Deleted

## 2019-12-27 DIAGNOSIS — J45909 Unspecified asthma, uncomplicated: Secondary | ICD-10-CM | POA: Diagnosis not present

## 2019-12-27 DIAGNOSIS — E782 Mixed hyperlipidemia: Secondary | ICD-10-CM | POA: Diagnosis not present

## 2019-12-27 DIAGNOSIS — I252 Old myocardial infarction: Secondary | ICD-10-CM | POA: Diagnosis not present

## 2019-12-27 DIAGNOSIS — E781 Pure hyperglyceridemia: Secondary | ICD-10-CM | POA: Diagnosis not present

## 2019-12-27 DIAGNOSIS — E1140 Type 2 diabetes mellitus with diabetic neuropathy, unspecified: Secondary | ICD-10-CM | POA: Diagnosis not present

## 2019-12-27 DIAGNOSIS — M1612 Unilateral primary osteoarthritis, left hip: Secondary | ICD-10-CM | POA: Diagnosis not present

## 2019-12-27 DIAGNOSIS — M1611 Unilateral primary osteoarthritis, right hip: Secondary | ICD-10-CM | POA: Diagnosis not present

## 2019-12-27 DIAGNOSIS — E1169 Type 2 diabetes mellitus with other specified complication: Secondary | ICD-10-CM | POA: Diagnosis not present

## 2019-12-27 DIAGNOSIS — Z471 Aftercare following joint replacement surgery: Secondary | ICD-10-CM | POA: Diagnosis not present

## 2019-12-27 DIAGNOSIS — E1151 Type 2 diabetes mellitus with diabetic peripheral angiopathy without gangrene: Secondary | ICD-10-CM | POA: Diagnosis not present

## 2019-12-27 DIAGNOSIS — I10 Essential (primary) hypertension: Secondary | ICD-10-CM | POA: Diagnosis not present

## 2019-12-27 DIAGNOSIS — E785 Hyperlipidemia, unspecified: Secondary | ICD-10-CM | POA: Diagnosis not present

## 2019-12-27 DIAGNOSIS — I2510 Atherosclerotic heart disease of native coronary artery without angina pectoris: Secondary | ICD-10-CM | POA: Diagnosis not present

## 2019-12-27 NOTE — Telephone Encounter (Signed)
Ortho bundle call completed. 

## 2019-12-28 DIAGNOSIS — E1151 Type 2 diabetes mellitus with diabetic peripheral angiopathy without gangrene: Secondary | ICD-10-CM | POA: Diagnosis not present

## 2019-12-28 DIAGNOSIS — M1611 Unilateral primary osteoarthritis, right hip: Secondary | ICD-10-CM | POA: Diagnosis not present

## 2019-12-28 DIAGNOSIS — I10 Essential (primary) hypertension: Secondary | ICD-10-CM | POA: Diagnosis not present

## 2019-12-28 DIAGNOSIS — E1169 Type 2 diabetes mellitus with other specified complication: Secondary | ICD-10-CM | POA: Diagnosis not present

## 2019-12-28 DIAGNOSIS — Z471 Aftercare following joint replacement surgery: Secondary | ICD-10-CM | POA: Diagnosis not present

## 2019-12-28 DIAGNOSIS — E785 Hyperlipidemia, unspecified: Secondary | ICD-10-CM | POA: Diagnosis not present

## 2019-12-28 DIAGNOSIS — J45909 Unspecified asthma, uncomplicated: Secondary | ICD-10-CM | POA: Diagnosis not present

## 2019-12-28 DIAGNOSIS — I2510 Atherosclerotic heart disease of native coronary artery without angina pectoris: Secondary | ICD-10-CM | POA: Diagnosis not present

## 2019-12-28 DIAGNOSIS — I252 Old myocardial infarction: Secondary | ICD-10-CM | POA: Diagnosis not present

## 2019-12-29 ENCOUNTER — Encounter: Payer: Self-pay | Admitting: Orthopaedic Surgery

## 2019-12-29 ENCOUNTER — Ambulatory Visit (INDEPENDENT_AMBULATORY_CARE_PROVIDER_SITE_OTHER): Payer: Medicare HMO | Admitting: Orthopaedic Surgery

## 2019-12-29 DIAGNOSIS — Z96642 Presence of left artificial hip joint: Secondary | ICD-10-CM

## 2019-12-29 NOTE — Progress Notes (Signed)
The patient is 2 weeks status post a left total hip arthroplasty.  On exam staples been removed.  There is some rawness at the groin area but no evidence of infection.  It is slightly red.  This is an area that can be treated with just mupirocin ointment daily after each shower and a Band-Aid.  His leg lengths appear equal.  He is walking with just a cane and doing well overall.  He did not need any pain medications.  We talked about his aspirin being on it twice a day.  He was on once a day before this and he will go back to just once a day.  He will continue increase his activities as comfort allows.  We would like to see him back in just 2 weeks for a wound check.  If there is any issues before then he will let us know.  All questions and concerns were answered and addressed.

## 2019-12-30 ENCOUNTER — Telehealth: Payer: Self-pay | Admitting: *Deleted

## 2019-12-30 NOTE — Telephone Encounter (Signed)
Ortho bundle 14 day call completed. 

## 2020-01-12 ENCOUNTER — Encounter: Payer: Self-pay | Admitting: Orthopaedic Surgery

## 2020-01-12 ENCOUNTER — Ambulatory Visit (INDEPENDENT_AMBULATORY_CARE_PROVIDER_SITE_OTHER): Payer: Medicare HMO | Admitting: Orthopaedic Surgery

## 2020-01-12 DIAGNOSIS — Z96642 Presence of left artificial hip joint: Secondary | ICD-10-CM

## 2020-01-12 NOTE — Progress Notes (Signed)
The patient will be a month tomorrow status post a left total hip arthroplasty.  I wanted to see him back this week just for a wound check.  He had a small dehiscence at the top of his wound.  He is treating this daily with antibacterial soapy water cleaning it and then placing Bactroban ointment.  He says he is doing well overall.  On examination there is only a small area at the top incision that is having a small area of dehiscence but is not worrisome.  I still feel comfortable and confident that this will heal with time.  He will still do the same wound care daily.  I will see him back in 4 weeks to see how he is doing overall but no x-rays are needed.  If there is any issues before then he knows to let us know.

## 2020-01-18 ENCOUNTER — Telehealth: Payer: Self-pay | Admitting: *Deleted

## 2020-01-18 MED ORDER — MUPIROCIN 2 % EX OINT: 1.0000 "application " | TOPICAL_OINTMENT | Freq: Every day | CUTANEOUS | 0 refills | Status: AC

## 2020-01-18 NOTE — Telephone Encounter (Signed)
30 day Ortho bundle call. Patient requested more of the Mupirocin ointment for his incision. He would like this sent to his pharmacy. Thanks.

## 2020-01-18 NOTE — Telephone Encounter (Signed)
I sent some in to the [facility] [address].

## 2020-02-01 DIAGNOSIS — Z23 Encounter for immunization: Secondary | ICD-10-CM | POA: Diagnosis not present

## 2020-02-09 ENCOUNTER — Ambulatory Visit (INDEPENDENT_AMBULATORY_CARE_PROVIDER_SITE_OTHER): Payer: Medicare HMO | Admitting: Orthopaedic Surgery

## 2020-02-09 ENCOUNTER — Encounter: Payer: Self-pay | Admitting: Orthopaedic Surgery

## 2020-02-09 DIAGNOSIS — Z96642 Presence of left artificial hip joint: Secondary | ICD-10-CM

## 2020-02-09 NOTE — Progress Notes (Signed)
The patient is now 7 weeks status post a left total hip arthroplasty through direct anterior approach.  He is reporting good hip motion and doing well overall.  His right hip has been bothering him quite a bit.  On examination his left hip incision looks good.  He has had some wound healing issues at the top most superior aspect of the incision but that looks better today.  He does tolerate me putting his left hip through internal and external rotation.  His right hip is very painful with limitation.  He will continue to increase his activities as comfort allows.  At some point we will need to likely replaced his right hip.  He would like to see his back in about 3 months.  At that visit like a standing low AP pelvis and lateral of his left operative hip.  If there is any issues before then he will let us know.

## 2020-03-01 DIAGNOSIS — E1140 Type 2 diabetes mellitus with diabetic neuropathy, unspecified: Secondary | ICD-10-CM | POA: Diagnosis not present

## 2020-03-01 DIAGNOSIS — E1169 Type 2 diabetes mellitus with other specified complication: Secondary | ICD-10-CM | POA: Diagnosis not present

## 2020-03-01 DIAGNOSIS — I10 Essential (primary) hypertension: Secondary | ICD-10-CM | POA: Diagnosis not present

## 2020-03-01 DIAGNOSIS — E782 Mixed hyperlipidemia: Secondary | ICD-10-CM | POA: Diagnosis not present

## 2020-03-01 DIAGNOSIS — E785 Hyperlipidemia, unspecified: Secondary | ICD-10-CM | POA: Diagnosis not present

## 2020-03-01 DIAGNOSIS — M1612 Unilateral primary osteoarthritis, left hip: Secondary | ICD-10-CM | POA: Diagnosis not present

## 2020-03-01 DIAGNOSIS — I252 Old myocardial infarction: Secondary | ICD-10-CM | POA: Diagnosis not present

## 2020-03-01 DIAGNOSIS — I2510 Atherosclerotic heart disease of native coronary artery without angina pectoris: Secondary | ICD-10-CM | POA: Diagnosis not present

## 2020-03-01 DIAGNOSIS — E781 Pure hyperglyceridemia: Secondary | ICD-10-CM | POA: Diagnosis not present

## 2020-03-09 NOTE — Telephone Encounter (Signed)
Ortho bundle call . 

## 2020-03-23 ENCOUNTER — Telehealth: Payer: Self-pay | Admitting: *Deleted

## 2020-03-23 NOTE — Telephone Encounter (Signed)
90 day Ortho bundle call completed. 

## 2020-04-02 DIAGNOSIS — I252 Old myocardial infarction: Secondary | ICD-10-CM | POA: Diagnosis not present

## 2020-04-02 DIAGNOSIS — I2510 Atherosclerotic heart disease of native coronary artery without angina pectoris: Secondary | ICD-10-CM | POA: Diagnosis not present

## 2020-04-02 DIAGNOSIS — E782 Mixed hyperlipidemia: Secondary | ICD-10-CM | POA: Diagnosis not present

## 2020-04-02 DIAGNOSIS — E1140 Type 2 diabetes mellitus with diabetic neuropathy, unspecified: Secondary | ICD-10-CM | POA: Diagnosis not present

## 2020-04-02 DIAGNOSIS — E1169 Type 2 diabetes mellitus with other specified complication: Secondary | ICD-10-CM | POA: Diagnosis not present

## 2020-04-02 DIAGNOSIS — Z7984 Long term (current) use of oral hypoglycemic drugs: Secondary | ICD-10-CM | POA: Diagnosis not present

## 2020-04-02 DIAGNOSIS — M1612 Unilateral primary osteoarthritis, left hip: Secondary | ICD-10-CM | POA: Diagnosis not present

## 2020-04-02 DIAGNOSIS — M1611 Unilateral primary osteoarthritis, right hip: Secondary | ICD-10-CM | POA: Diagnosis not present

## 2020-04-02 DIAGNOSIS — I10 Essential (primary) hypertension: Secondary | ICD-10-CM | POA: Diagnosis not present

## 2020-04-04 DIAGNOSIS — E782 Mixed hyperlipidemia: Secondary | ICD-10-CM | POA: Diagnosis not present

## 2020-04-04 DIAGNOSIS — I10 Essential (primary) hypertension: Secondary | ICD-10-CM | POA: Diagnosis not present

## 2020-04-04 DIAGNOSIS — E781 Pure hyperglyceridemia: Secondary | ICD-10-CM | POA: Diagnosis not present

## 2020-04-04 DIAGNOSIS — E785 Hyperlipidemia, unspecified: Secondary | ICD-10-CM | POA: Diagnosis not present

## 2020-04-04 DIAGNOSIS — I252 Old myocardial infarction: Secondary | ICD-10-CM | POA: Diagnosis not present

## 2020-04-04 DIAGNOSIS — E1169 Type 2 diabetes mellitus with other specified complication: Secondary | ICD-10-CM | POA: Diagnosis not present

## 2020-04-04 DIAGNOSIS — E1140 Type 2 diabetes mellitus with diabetic neuropathy, unspecified: Secondary | ICD-10-CM | POA: Diagnosis not present

## 2020-04-04 DIAGNOSIS — I2510 Atherosclerotic heart disease of native coronary artery without angina pectoris: Secondary | ICD-10-CM | POA: Diagnosis not present

## 2020-04-04 DIAGNOSIS — M1612 Unilateral primary osteoarthritis, left hip: Secondary | ICD-10-CM | POA: Diagnosis not present

## 2020-05-10 ENCOUNTER — Ambulatory Visit: Payer: Medicare HMO | Admitting: Orthopaedic Surgery

## 2020-05-26 DIAGNOSIS — E785 Hyperlipidemia, unspecified: Secondary | ICD-10-CM | POA: Diagnosis not present

## 2020-05-26 DIAGNOSIS — I252 Old myocardial infarction: Secondary | ICD-10-CM | POA: Diagnosis not present

## 2020-05-26 DIAGNOSIS — E782 Mixed hyperlipidemia: Secondary | ICD-10-CM | POA: Diagnosis not present

## 2020-05-26 DIAGNOSIS — E1169 Type 2 diabetes mellitus with other specified complication: Secondary | ICD-10-CM | POA: Diagnosis not present

## 2020-05-26 DIAGNOSIS — M1612 Unilateral primary osteoarthritis, left hip: Secondary | ICD-10-CM | POA: Diagnosis not present

## 2020-05-26 DIAGNOSIS — I10 Essential (primary) hypertension: Secondary | ICD-10-CM | POA: Diagnosis not present

## 2020-05-26 DIAGNOSIS — I2510 Atherosclerotic heart disease of native coronary artery without angina pectoris: Secondary | ICD-10-CM | POA: Diagnosis not present

## 2020-05-26 DIAGNOSIS — E781 Pure hyperglyceridemia: Secondary | ICD-10-CM | POA: Diagnosis not present

## 2020-05-26 DIAGNOSIS — E1140 Type 2 diabetes mellitus with diabetic neuropathy, unspecified: Secondary | ICD-10-CM | POA: Diagnosis not present

## 2020-07-09 DIAGNOSIS — I252 Old myocardial infarction: Secondary | ICD-10-CM | POA: Diagnosis not present

## 2020-07-09 DIAGNOSIS — E1169 Type 2 diabetes mellitus with other specified complication: Secondary | ICD-10-CM | POA: Diagnosis not present

## 2020-07-09 DIAGNOSIS — E782 Mixed hyperlipidemia: Secondary | ICD-10-CM | POA: Diagnosis not present

## 2020-07-09 DIAGNOSIS — E1140 Type 2 diabetes mellitus with diabetic neuropathy, unspecified: Secondary | ICD-10-CM | POA: Diagnosis not present

## 2020-07-09 DIAGNOSIS — I10 Essential (primary) hypertension: Secondary | ICD-10-CM | POA: Diagnosis not present

## 2020-07-09 DIAGNOSIS — M1612 Unilateral primary osteoarthritis, left hip: Secondary | ICD-10-CM | POA: Diagnosis not present

## 2020-07-09 DIAGNOSIS — I2510 Atherosclerotic heart disease of native coronary artery without angina pectoris: Secondary | ICD-10-CM | POA: Diagnosis not present

## 2020-07-09 DIAGNOSIS — M1611 Unilateral primary osteoarthritis, right hip: Secondary | ICD-10-CM | POA: Diagnosis not present

## 2020-07-09 DIAGNOSIS — E781 Pure hyperglyceridemia: Secondary | ICD-10-CM | POA: Diagnosis not present

## 2020-09-24 DIAGNOSIS — E782 Mixed hyperlipidemia: Secondary | ICD-10-CM | POA: Diagnosis not present

## 2020-09-24 DIAGNOSIS — I252 Old myocardial infarction: Secondary | ICD-10-CM | POA: Diagnosis not present

## 2020-09-24 DIAGNOSIS — E1140 Type 2 diabetes mellitus with diabetic neuropathy, unspecified: Secondary | ICD-10-CM | POA: Diagnosis not present

## 2020-09-24 DIAGNOSIS — I10 Essential (primary) hypertension: Secondary | ICD-10-CM | POA: Diagnosis not present

## 2020-09-24 DIAGNOSIS — E1169 Type 2 diabetes mellitus with other specified complication: Secondary | ICD-10-CM | POA: Diagnosis not present

## 2020-09-24 DIAGNOSIS — E781 Pure hyperglyceridemia: Secondary | ICD-10-CM | POA: Diagnosis not present

## 2020-09-24 DIAGNOSIS — M1611 Unilateral primary osteoarthritis, right hip: Secondary | ICD-10-CM | POA: Diagnosis not present

## 2020-09-24 DIAGNOSIS — I2510 Atherosclerotic heart disease of native coronary artery without angina pectoris: Secondary | ICD-10-CM | POA: Diagnosis not present

## 2020-09-24 DIAGNOSIS — M1612 Unilateral primary osteoarthritis, left hip: Secondary | ICD-10-CM | POA: Diagnosis not present

## 2020-10-17 DIAGNOSIS — M1612 Unilateral primary osteoarthritis, left hip: Secondary | ICD-10-CM | POA: Diagnosis not present

## 2020-10-17 DIAGNOSIS — E781 Pure hyperglyceridemia: Secondary | ICD-10-CM | POA: Diagnosis not present

## 2020-10-17 DIAGNOSIS — I739 Peripheral vascular disease, unspecified: Secondary | ICD-10-CM | POA: Diagnosis not present

## 2020-10-17 DIAGNOSIS — E1169 Type 2 diabetes mellitus with other specified complication: Secondary | ICD-10-CM | POA: Diagnosis not present

## 2020-10-17 DIAGNOSIS — E1140 Type 2 diabetes mellitus with diabetic neuropathy, unspecified: Secondary | ICD-10-CM | POA: Diagnosis not present

## 2020-10-17 DIAGNOSIS — Z79899 Other long term (current) drug therapy: Secondary | ICD-10-CM | POA: Diagnosis not present

## 2020-10-17 DIAGNOSIS — I10 Essential (primary) hypertension: Secondary | ICD-10-CM | POA: Diagnosis not present

## 2020-10-17 DIAGNOSIS — E559 Vitamin D deficiency, unspecified: Secondary | ICD-10-CM | POA: Diagnosis not present

## 2020-10-17 DIAGNOSIS — Z0001 Encounter for general adult medical examination with abnormal findings: Secondary | ICD-10-CM | POA: Diagnosis not present

## 2020-10-17 DIAGNOSIS — Z7984 Long term (current) use of oral hypoglycemic drugs: Secondary | ICD-10-CM | POA: Diagnosis not present

## 2020-10-17 DIAGNOSIS — I252 Old myocardial infarction: Secondary | ICD-10-CM | POA: Diagnosis not present

## 2020-10-17 DIAGNOSIS — Z125 Encounter for screening for malignant neoplasm of prostate: Secondary | ICD-10-CM | POA: Diagnosis not present

## 2020-10-25 DIAGNOSIS — E1140 Type 2 diabetes mellitus with diabetic neuropathy, unspecified: Secondary | ICD-10-CM | POA: Diagnosis not present

## 2020-10-25 DIAGNOSIS — E785 Hyperlipidemia, unspecified: Secondary | ICD-10-CM | POA: Diagnosis not present

## 2020-10-25 DIAGNOSIS — I252 Old myocardial infarction: Secondary | ICD-10-CM | POA: Diagnosis not present

## 2020-10-25 DIAGNOSIS — E1169 Type 2 diabetes mellitus with other specified complication: Secondary | ICD-10-CM | POA: Diagnosis not present

## 2020-10-25 DIAGNOSIS — M1612 Unilateral primary osteoarthritis, left hip: Secondary | ICD-10-CM | POA: Diagnosis not present

## 2020-10-25 DIAGNOSIS — I2510 Atherosclerotic heart disease of native coronary artery without angina pectoris: Secondary | ICD-10-CM | POA: Diagnosis not present

## 2020-10-25 DIAGNOSIS — E781 Pure hyperglyceridemia: Secondary | ICD-10-CM | POA: Diagnosis not present

## 2020-10-25 DIAGNOSIS — I10 Essential (primary) hypertension: Secondary | ICD-10-CM | POA: Diagnosis not present

## 2020-10-25 DIAGNOSIS — E782 Mixed hyperlipidemia: Secondary | ICD-10-CM | POA: Diagnosis not present

## 2020-11-01 DIAGNOSIS — E119 Type 2 diabetes mellitus without complications: Secondary | ICD-10-CM | POA: Diagnosis not present

## 2020-12-18 DIAGNOSIS — M1612 Unilateral primary osteoarthritis, left hip: Secondary | ICD-10-CM | POA: Diagnosis not present

## 2020-12-18 DIAGNOSIS — I2510 Atherosclerotic heart disease of native coronary artery without angina pectoris: Secondary | ICD-10-CM | POA: Diagnosis not present

## 2020-12-18 DIAGNOSIS — E1140 Type 2 diabetes mellitus with diabetic neuropathy, unspecified: Secondary | ICD-10-CM | POA: Diagnosis not present

## 2020-12-18 DIAGNOSIS — E782 Mixed hyperlipidemia: Secondary | ICD-10-CM | POA: Diagnosis not present

## 2020-12-18 DIAGNOSIS — I10 Essential (primary) hypertension: Secondary | ICD-10-CM | POA: Diagnosis not present

## 2020-12-18 DIAGNOSIS — E785 Hyperlipidemia, unspecified: Secondary | ICD-10-CM | POA: Diagnosis not present

## 2020-12-18 DIAGNOSIS — E1169 Type 2 diabetes mellitus with other specified complication: Secondary | ICD-10-CM | POA: Diagnosis not present

## 2020-12-18 DIAGNOSIS — E781 Pure hyperglyceridemia: Secondary | ICD-10-CM | POA: Diagnosis not present

## 2020-12-18 DIAGNOSIS — I252 Old myocardial infarction: Secondary | ICD-10-CM | POA: Diagnosis not present

## 2021-01-02 ENCOUNTER — Telehealth: Payer: Self-pay | Admitting: *Deleted

## 2021-01-02 NOTE — Telephone Encounter (Signed)
Ortho bundle 1 year call and survey completed. 

## 2021-04-24 DIAGNOSIS — E559 Vitamin D deficiency, unspecified: Secondary | ICD-10-CM | POA: Diagnosis not present

## 2021-04-24 DIAGNOSIS — E781 Pure hyperglyceridemia: Secondary | ICD-10-CM | POA: Diagnosis not present

## 2021-04-24 DIAGNOSIS — Z89422 Acquired absence of other left toe(s): Secondary | ICD-10-CM | POA: Diagnosis not present

## 2021-04-24 DIAGNOSIS — Z7984 Long term (current) use of oral hypoglycemic drugs: Secondary | ICD-10-CM | POA: Diagnosis not present

## 2021-04-24 DIAGNOSIS — I10 Essential (primary) hypertension: Secondary | ICD-10-CM | POA: Diagnosis not present

## 2021-04-24 DIAGNOSIS — E782 Mixed hyperlipidemia: Secondary | ICD-10-CM | POA: Diagnosis not present

## 2021-04-24 DIAGNOSIS — E1169 Type 2 diabetes mellitus with other specified complication: Secondary | ICD-10-CM | POA: Diagnosis not present

## 2021-04-24 DIAGNOSIS — I739 Peripheral vascular disease, unspecified: Secondary | ICD-10-CM | POA: Diagnosis not present

## 2021-04-24 DIAGNOSIS — E1140 Type 2 diabetes mellitus with diabetic neuropathy, unspecified: Secondary | ICD-10-CM | POA: Diagnosis not present

## 2021-04-24 DIAGNOSIS — M25552 Pain in left hip: Secondary | ICD-10-CM | POA: Diagnosis not present

## 2021-04-24 DIAGNOSIS — I252 Old myocardial infarction: Secondary | ICD-10-CM | POA: Diagnosis not present

## 2021-04-24 DIAGNOSIS — E1151 Type 2 diabetes mellitus with diabetic peripheral angiopathy without gangrene: Secondary | ICD-10-CM | POA: Diagnosis not present

## 2021-07-26 DIAGNOSIS — E782 Mixed hyperlipidemia: Secondary | ICD-10-CM | POA: Diagnosis not present

## 2021-07-26 DIAGNOSIS — I10 Essential (primary) hypertension: Secondary | ICD-10-CM | POA: Diagnosis not present

## 2021-11-26 DIAGNOSIS — Z0000 Encounter for general adult medical examination without abnormal findings: Secondary | ICD-10-CM | POA: Diagnosis not present

## 2021-11-26 DIAGNOSIS — Z1389 Encounter for screening for other disorder: Secondary | ICD-10-CM | POA: Diagnosis not present

## 2021-11-26 DIAGNOSIS — E782 Mixed hyperlipidemia: Secondary | ICD-10-CM | POA: Diagnosis not present

## 2021-11-26 DIAGNOSIS — I2510 Atherosclerotic heart disease of native coronary artery without angina pectoris: Secondary | ICD-10-CM | POA: Diagnosis not present

## 2021-11-26 DIAGNOSIS — I252 Old myocardial infarction: Secondary | ICD-10-CM | POA: Diagnosis not present

## 2021-11-26 DIAGNOSIS — E1169 Type 2 diabetes mellitus with other specified complication: Secondary | ICD-10-CM | POA: Diagnosis not present

## 2021-11-26 DIAGNOSIS — Z79899 Other long term (current) drug therapy: Secondary | ICD-10-CM | POA: Diagnosis not present

## 2021-11-26 DIAGNOSIS — I10 Essential (primary) hypertension: Secondary | ICD-10-CM | POA: Diagnosis not present

## 2021-11-26 DIAGNOSIS — E1140 Type 2 diabetes mellitus with diabetic neuropathy, unspecified: Secondary | ICD-10-CM | POA: Diagnosis not present

## 2021-11-26 DIAGNOSIS — Z125 Encounter for screening for malignant neoplasm of prostate: Secondary | ICD-10-CM | POA: Diagnosis not present

## 2021-11-26 DIAGNOSIS — E559 Vitamin D deficiency, unspecified: Secondary | ICD-10-CM | POA: Diagnosis not present

## 2021-11-26 DIAGNOSIS — E781 Pure hyperglyceridemia: Secondary | ICD-10-CM | POA: Diagnosis not present

## 2022-05-30 DIAGNOSIS — E781 Pure hyperglyceridemia: Secondary | ICD-10-CM | POA: Diagnosis not present

## 2022-05-30 DIAGNOSIS — E782 Mixed hyperlipidemia: Secondary | ICD-10-CM | POA: Diagnosis not present

## 2022-05-30 DIAGNOSIS — E1169 Type 2 diabetes mellitus with other specified complication: Secondary | ICD-10-CM | POA: Diagnosis not present

## 2022-05-30 DIAGNOSIS — Z720 Tobacco use: Secondary | ICD-10-CM | POA: Diagnosis not present

## 2022-05-30 DIAGNOSIS — G720 Drug-induced myopathy: Secondary | ICD-10-CM | POA: Diagnosis not present

## 2022-05-30 DIAGNOSIS — E1140 Type 2 diabetes mellitus with diabetic neuropathy, unspecified: Secondary | ICD-10-CM | POA: Diagnosis not present

## 2022-05-30 DIAGNOSIS — I2510 Atherosclerotic heart disease of native coronary artery without angina pectoris: Secondary | ICD-10-CM | POA: Diagnosis not present

## 2022-05-30 DIAGNOSIS — I10 Essential (primary) hypertension: Secondary | ICD-10-CM | POA: Diagnosis not present

## 2022-05-30 DIAGNOSIS — E559 Vitamin D deficiency, unspecified: Secondary | ICD-10-CM | POA: Diagnosis not present

## 2022-05-30 DIAGNOSIS — R7989 Other specified abnormal findings of blood chemistry: Secondary | ICD-10-CM | POA: Diagnosis not present

## 2022-06-18 ENCOUNTER — Telehealth: Payer: Self-pay

## 2022-06-18 NOTE — Patient Outreach (Signed)
  Care Coordination   06/18/2022 Name: [name] MRN: [id number] DOB: 1955/09/27   Care Coordination Outreach Attempts:  An unsuccessful telephone outreach was attempted today to offer the patient information about available care coordination services as a benefit of their health plan.   Follow Up Plan:  Additional outreach attempts will be made to offer the patient care coordination information and services.   Encounter Outcome:  No Answer   Care Coordination Interventions:  No, not indicated    Peter Garter RN, BSN,CCM, CDE Care Management Coordinator [place] Management [phone]

## 2022-06-26 ENCOUNTER — Telehealth: Payer: Self-pay

## 2022-06-26 NOTE — Patient Instructions (Signed)
Visit Information  Thank you for taking time to visit with me today. Please don't hesitate to contact me if I can be of assistance to you.   Following are the goals we discussed today:   Goals Addressed             This Visit's Progress    COMPLETED: Care Coordination Activities - no follow up required       Interventions Today    Flowsheet Row Most Recent Value  Chronic Disease   Chronic disease during today's visit Diabetes  General Interventions   General Interventions Discussed/Reviewed General Interventions Discussed, Doctor Visits  Doctor Visits Discussed/Reviewed Annual Wellness Visits, Doctor Visits Discussed  Exercise Interventions   Exercise Discussed/Reviewed Exercise Discussed  [Reviewed insurance fitness benefit]  Education Interventions   Education Provided Provided Education  Provided Verbal Education On Nutrition, Exercise, When to see the doctor, Other  [care coordination services]  Nutrition Interventions   Nutrition Discussed/Reviewed Nutrition Discussed, Decreasing sugar intake, Decreasing salt, Decreasing fats               If you are experiencing a Mental Health or [place] or need someone to talk to, please call the Suicide and Crisis Lifeline: 988 call the [place] National Suicide Prevention Lifeline: 415-630-4242 or TTY: 220-021-2661 TTY 630-011-9835) to talk to a trained counselor call 1-800-273-TALK (toll free, 24 hour hotline) go to [facility] [place] [phone]) call 911   Patient verbalizes understanding of instructions and care plan provided today and agrees to view in [place]. Active MyChart status and patient understanding of how to access instructions and care plan via MyChart confirmed with patient.     No further follow up required:    Peter Garter RN, [place], [place] Management [phone]

## 2022-06-26 NOTE — Patient Outreach (Signed)
  Care Coordination   Initial Visit Note   06/26/2022 Name: [name] MRN: [id number] DOB: January 28, 1956  [name] is a 67 y.o. year old male who sees Josetta Huddle, MD for primary care. I spoke with  [name] by phone today.  What matters to the patients health and wellness today?  No concerns today States he does not think he needs any help and he knows when to call his provider    Goals Addressed             This Visit's Progress    COMPLETED: Care Coordination Activities - no follow up required       Interventions Today    Flowsheet Row Most Recent Value  Chronic Disease   Chronic disease during today's visit Diabetes  General Interventions   General Interventions Discussed/Reviewed General Interventions Discussed, Doctor Visits  Doctor Visits Discussed/Reviewed Annual Wellness Visits, Doctor Visits Discussed  Exercise Interventions   Exercise Discussed/Reviewed Exercise Discussed  [Reviewed insurance fitness benefit]  Education Interventions   Education Provided Provided Education  Provided Verbal Education On Nutrition, Exercise, When to see the doctor, Other  [care coordination services]  Nutrition Interventions   Nutrition Discussed/Reviewed Nutrition Discussed, Decreasing sugar intake, Decreasing salt, Decreasing fats              SDOH assessments and interventions completed:  Yes  SDOH Interventions Today    Flowsheet Row Most Recent Value  SDOH Interventions   Food Insecurity Interventions Intervention Not Indicated  Housing Interventions Intervention Not Indicated  Transportation Interventions Intervention Not Indicated  Utilities Interventions Intervention Not Indicated        Care Coordination Interventions:  Yes, provided   Follow up plan: No further intervention required.   Encounter Outcome:  Pt. Visit Completed  Peter Garter RN, BSN,CCM, CDE Care Management Coordinator [place] Management [phone]

## 2022-07-10 DIAGNOSIS — E538 Deficiency of other specified B group vitamins: Secondary | ICD-10-CM | POA: Diagnosis not present

## 2022-11-28 DIAGNOSIS — Z1331 Encounter for screening for depression: Secondary | ICD-10-CM | POA: Diagnosis not present

## 2022-11-28 DIAGNOSIS — N1831 Chronic kidney disease, stage 3a: Secondary | ICD-10-CM | POA: Diagnosis not present

## 2022-11-28 DIAGNOSIS — E1151 Type 2 diabetes mellitus with diabetic peripheral angiopathy without gangrene: Secondary | ICD-10-CM | POA: Diagnosis not present

## 2022-11-28 DIAGNOSIS — Z23 Encounter for immunization: Secondary | ICD-10-CM | POA: Diagnosis not present

## 2022-11-28 DIAGNOSIS — Z0000 Encounter for general adult medical examination without abnormal findings: Secondary | ICD-10-CM | POA: Diagnosis not present

## 2022-11-28 DIAGNOSIS — E538 Deficiency of other specified B group vitamins: Secondary | ICD-10-CM | POA: Diagnosis not present

## 2022-11-28 DIAGNOSIS — E781 Pure hyperglyceridemia: Secondary | ICD-10-CM | POA: Diagnosis not present

## 2022-11-28 DIAGNOSIS — I10 Essential (primary) hypertension: Secondary | ICD-10-CM | POA: Diagnosis not present

## 2022-11-28 DIAGNOSIS — Z79899 Other long term (current) drug therapy: Secondary | ICD-10-CM | POA: Diagnosis not present

## 2022-11-28 DIAGNOSIS — E559 Vitamin D deficiency, unspecified: Secondary | ICD-10-CM | POA: Diagnosis not present

## 2022-11-28 DIAGNOSIS — E1140 Type 2 diabetes mellitus with diabetic neuropathy, unspecified: Secondary | ICD-10-CM | POA: Diagnosis not present

## 2022-11-28 DIAGNOSIS — E1122 Type 2 diabetes mellitus with diabetic chronic kidney disease: Secondary | ICD-10-CM | POA: Diagnosis not present

## 2022-11-28 DIAGNOSIS — Z89422 Acquired absence of other left toe(s): Secondary | ICD-10-CM | POA: Diagnosis not present

## 2022-11-28 DIAGNOSIS — E782 Mixed hyperlipidemia: Secondary | ICD-10-CM | POA: Diagnosis not present

## 2023-05-04 ENCOUNTER — Encounter: Payer: Self-pay | Admitting: Podiatry

## 2023-05-04 ENCOUNTER — Ambulatory Visit: Payer: Medicare HMO | Admitting: Podiatry

## 2023-05-04 VITALS — Ht 70.0 in | Wt 190.0 lb

## 2023-05-04 DIAGNOSIS — M79675 Pain in left toe(s): Secondary | ICD-10-CM | POA: Diagnosis not present

## 2023-05-04 DIAGNOSIS — M79674 Pain in right toe(s): Secondary | ICD-10-CM | POA: Diagnosis not present

## 2023-05-04 DIAGNOSIS — L853 Xerosis cutis: Secondary | ICD-10-CM | POA: Diagnosis not present

## 2023-05-04 DIAGNOSIS — E1151 Type 2 diabetes mellitus with diabetic peripheral angiopathy without gangrene: Secondary | ICD-10-CM

## 2023-05-04 DIAGNOSIS — Z89422 Acquired absence of other left toe(s): Secondary | ICD-10-CM

## 2023-05-04 DIAGNOSIS — B351 Tinea unguium: Secondary | ICD-10-CM | POA: Diagnosis not present

## 2023-05-04 NOTE — Progress Notes (Unsigned)
Previous fifth fifth ray partial amputation in the past due to an infected corn on the fifth toe that was not healing.  Turned out he needed a revascularization procedure to the left lower extremity.  He had this done and has been no problems since.  The left hallux nail recently fell off but this is stable and dry.  Right fifth toenail has bruising along the proximal 40% but is otherwise intact.  There is a moderate curvature to this nail.  Nails were trimmed x 10.  Skin is dry.  Follow-up 3 months

## 2023-06-02 DIAGNOSIS — E1140 Type 2 diabetes mellitus with diabetic neuropathy, unspecified: Secondary | ICD-10-CM | POA: Diagnosis not present

## 2023-06-02 DIAGNOSIS — E782 Mixed hyperlipidemia: Secondary | ICD-10-CM | POA: Diagnosis not present

## 2023-06-02 DIAGNOSIS — E781 Pure hyperglyceridemia: Secondary | ICD-10-CM | POA: Diagnosis not present

## 2023-06-02 DIAGNOSIS — E1122 Type 2 diabetes mellitus with diabetic chronic kidney disease: Secondary | ICD-10-CM | POA: Diagnosis not present

## 2023-06-02 DIAGNOSIS — I2510 Atherosclerotic heart disease of native coronary artery without angina pectoris: Secondary | ICD-10-CM | POA: Diagnosis not present

## 2023-06-02 DIAGNOSIS — N1831 Chronic kidney disease, stage 3a: Secondary | ICD-10-CM | POA: Diagnosis not present

## 2023-06-02 DIAGNOSIS — I10 Essential (primary) hypertension: Secondary | ICD-10-CM | POA: Diagnosis not present

## 2023-06-02 DIAGNOSIS — E538 Deficiency of other specified B group vitamins: Secondary | ICD-10-CM | POA: Diagnosis not present

## 2023-06-02 DIAGNOSIS — I252 Old myocardial infarction: Secondary | ICD-10-CM | POA: Diagnosis not present

## 2023-06-30 DIAGNOSIS — E119 Type 2 diabetes mellitus without complications: Secondary | ICD-10-CM | POA: Diagnosis not present

## 2023-08-03 ENCOUNTER — Ambulatory Visit: Payer: Medicare HMO | Admitting: Podiatry

## 2023-08-03 DIAGNOSIS — M79674 Pain in right toe(s): Secondary | ICD-10-CM | POA: Diagnosis not present

## 2023-08-03 DIAGNOSIS — E1151 Type 2 diabetes mellitus with diabetic peripheral angiopathy without gangrene: Secondary | ICD-10-CM | POA: Diagnosis not present

## 2023-08-03 DIAGNOSIS — Z89422 Acquired absence of other left toe(s): Secondary | ICD-10-CM

## 2023-08-03 DIAGNOSIS — M79675 Pain in left toe(s): Secondary | ICD-10-CM | POA: Diagnosis not present

## 2023-08-03 DIAGNOSIS — B351 Tinea unguium: Secondary | ICD-10-CM | POA: Diagnosis not present

## 2023-08-03 NOTE — Progress Notes (Signed)
 Subjective:  Patient ID: [name], male    DOB: 11/29/1955,  MRN: [id number]  [name] presents to clinic today for:  Chief Complaint  Patient presents with   Nail Problem    Nail trim    Patient notes nails are thick and elongated, causing pain in shoe gear when ambulating.  He has had previous left 4th and 5th toe amputations.   PCP is Emilio Aspen, MD.  Last seen 06/02/23.    Past Medical History:  Diagnosis Date   Acute osteomyelitis of left foot (HCC) 03/16/2015   Asthma    bronchitis to asthma many years   CAD, NATIVE VESSEL 04/16/2008   Qualifier: Diagnosis of  By: Shirlee Latch, MD, [place]     Cancer [facility])    non-hodgkins lymphoma  2000  recd radiation   Cellulitis and abscess of leg, except foot 02/21/2015   Colon cancer screening 01/30/2015   Diabetic ulcer of left foot with necrosis of muscle (HCC) 02/21/2015   Dyslipidemia associated with type 2 diabetes mellitus (HCC) 03/16/2015   Encounter for long-term (current) use of other medications 10/16/2011   Essential hypertension 04/16/2008   Qualifier: Diagnosis of  By: Shirlee Latch, MD, [place]     Hyperlipidemia with target LDL less than 100 04/16/2008        Hypertriglyceridemia 03/26/2014   Left carotid bruit 12/11/2014   Myocardial infarction [facility])    2009   Osteoarthritis of right hip 04/09/2012   PAD (peripheral artery disease) (HCC) 03/26/2014   Peripheral vascular disease, unspecified (HCC) 11/17/2011   Pneumonia    As a child   Septic arthritis of left foot (HCC) 03/16/2015   Type 2 diabetes mellitus with vascular disease (HCC) 06/22/2009   Qualifier: Diagnosis of  By: Sharren Bridge, RN, BSN, [place]     Uncontrolled diabetes mellitus type 2 with peripheral artery disease 03/16/2015    Allergies  Allergen Reactions   Fenofibrate Other (See Comments)    Joint pain/cramping/burning    Pioglitazone Swelling and Rash    Edema unspecified peripheral edema   Crestor [Rosuvastatin]    Chlorhexidine  Gluconate Rash   Orange Oil Rash   Soap Other (See Comments)    States that most soaps make him break out/rash-he uses a sensitive skin soap and shampoos as well, has to use specific allergen free soap and shampoo    Objective:  [name] is a pleasant 68 y.o. male in NAD. AAO x 3.  Vascular Examination: Patient has palpable DP pulse, absent PT pulse bilateral.  Delayed capillary refill bilateral toes.  Sparse digital hair bilateral.  Proximal to distal cooling WNL bilateral.    Dermatological Examination: Interspaces are clear with no open lesions noted bilateral.  Skin is shiny and atrophic bilateral.  Nails are 3-33mm thick, with yellowish/brown discoloration, subungual debris and distal onycholysis x8.  There is pain with compression of nails x8.    Patient qualifies for at-risk foot care because of diabetes with PVD .  Assessment/Plan: 1. Pain due to onychomycosis of toenails of both feet   2. Type II diabetes mellitus with peripheral circulatory disorder (HCC)   3. History of amputation of lesser toe of left foot (HCC)    Mycotic nails x8 were sharply debrided with sterile nail nippers and power debriding burr to decrease bulk and length.   Return in about 3 months (around 11/02/2023) for [facility].   Clerance Lav, DPM, FACFAS [facility]  [address] [place], [place] [address]                Office [phone]  Fax [phone]

## 2023-12-02 DIAGNOSIS — Z1331 Encounter for screening for depression: Secondary | ICD-10-CM | POA: Diagnosis not present

## 2023-12-02 DIAGNOSIS — E782 Mixed hyperlipidemia: Secondary | ICD-10-CM | POA: Diagnosis not present

## 2023-12-02 DIAGNOSIS — N1831 Chronic kidney disease, stage 3a: Secondary | ICD-10-CM | POA: Diagnosis not present

## 2023-12-02 DIAGNOSIS — E538 Deficiency of other specified B group vitamins: Secondary | ICD-10-CM | POA: Diagnosis not present

## 2023-12-02 DIAGNOSIS — E559 Vitamin D deficiency, unspecified: Secondary | ICD-10-CM | POA: Diagnosis not present

## 2023-12-02 DIAGNOSIS — L97529 Non-pressure chronic ulcer of other part of left foot with unspecified severity: Secondary | ICD-10-CM | POA: Diagnosis not present

## 2023-12-02 DIAGNOSIS — E781 Pure hyperglyceridemia: Secondary | ICD-10-CM | POA: Diagnosis not present

## 2023-12-02 DIAGNOSIS — Z0000 Encounter for general adult medical examination without abnormal findings: Secondary | ICD-10-CM | POA: Diagnosis not present

## 2023-12-02 DIAGNOSIS — Z79899 Other long term (current) drug therapy: Secondary | ICD-10-CM | POA: Diagnosis not present

## 2023-12-02 DIAGNOSIS — I2510 Atherosclerotic heart disease of native coronary artery without angina pectoris: Secondary | ICD-10-CM | POA: Diagnosis not present

## 2023-12-02 DIAGNOSIS — E1122 Type 2 diabetes mellitus with diabetic chronic kidney disease: Secondary | ICD-10-CM | POA: Diagnosis not present

## 2023-12-02 DIAGNOSIS — I10 Essential (primary) hypertension: Secondary | ICD-10-CM | POA: Diagnosis not present

## 2023-12-02 DIAGNOSIS — I252 Old myocardial infarction: Secondary | ICD-10-CM | POA: Diagnosis not present

## 2023-12-02 DIAGNOSIS — E1140 Type 2 diabetes mellitus with diabetic neuropathy, unspecified: Secondary | ICD-10-CM | POA: Diagnosis not present

## 2023-12-02 LAB — LAB REPORT - SCANNED
A1c: 5.9
EGFR: 52

## 2023-12-03 DIAGNOSIS — L97529 Non-pressure chronic ulcer of other part of left foot with unspecified severity: Secondary | ICD-10-CM | POA: Diagnosis not present

## 2023-12-21 ENCOUNTER — Ambulatory Visit: Admitting: Podiatry

## 2023-12-21 DIAGNOSIS — B965 Pseudomonas (aeruginosa) (mallei) (pseudomallei) as the cause of diseases classified elsewhere: Secondary | ICD-10-CM | POA: Diagnosis not present

## 2023-12-21 DIAGNOSIS — E11621 Type 2 diabetes mellitus with foot ulcer: Secondary | ICD-10-CM

## 2023-12-21 DIAGNOSIS — L03116 Cellulitis of left lower limb: Secondary | ICD-10-CM | POA: Diagnosis not present

## 2023-12-21 DIAGNOSIS — L97422 Non-pressure chronic ulcer of left heel and midfoot with fat layer exposed: Secondary | ICD-10-CM

## 2023-12-21 DIAGNOSIS — L080 Pyoderma: Secondary | ICD-10-CM | POA: Diagnosis not present

## 2023-12-21 DIAGNOSIS — B952 Enterococcus as the cause of diseases classified elsewhere: Secondary | ICD-10-CM | POA: Diagnosis not present

## 2023-12-21 MED ORDER — DOXYCYCLINE HYCLATE 100 MG PO CAPS: 100.0000 mg | ORAL_CAPSULE | Freq: Two times a day (BID) | ORAL | 0 refills | Status: DC

## 2023-12-21 MED ORDER — SANTYL 250 UNIT/GM EX OINT: 1.0000 | TOPICAL_OINTMENT | Freq: Every day | CUTANEOUS | 3 refills | Status: DC

## 2023-12-21 NOTE — Progress Notes (Unsigned)
 Chief Complaint  Patient presents with   Foot Ulcer    Patient is here for lateral left foot ulcer, healing slowly, very moist, infection on left. right foot is doing well at this time too.   HPI: 68 y.o. male presenting today for concern of a wound on the left lateral forefoot.  Saw his PCP recently who put him on antibiotics.  He is not sure how the wound began.  Saw his PCP around 12/03/2023.  Denies fever, chills, nausea/vomiting  Past Medical History:  Diagnosis Date   Acute osteomyelitis of left foot (HCC) 03/16/2015   Asthma    bronchitis to asthma many years   CAD, NATIVE VESSEL 04/16/2008   Qualifier: Diagnosis of  By: Rolan, MD, Dalton     Cancer [facility])    non-hodgkins lymphoma  2000  recd radiation   Cellulitis and abscess of leg, except foot 02/21/2015   Colon cancer screening 01/30/2015   Diabetic ulcer of left foot with necrosis of muscle (HCC) 02/21/2015   Dyslipidemia associated with type 2 diabetes mellitus (HCC) 03/16/2015   Encounter for long-term (current) use of other medications 10/16/2011   Essential hypertension 04/16/2008   Qualifier: Diagnosis of  By: Rolan, MD, Dalton     Hyperlipidemia with target LDL less than 100 04/16/2008        Hypertriglyceridemia 03/26/2014   Left carotid bruit 12/11/2014   Myocardial infarction [facility])    2009   Osteoarthritis of right hip 04/09/2012   PAD (peripheral artery disease) (HCC) 03/26/2014   Peripheral vascular disease, unspecified (HCC) 11/17/2011   Pneumonia    As a child   Septic arthritis of left foot (HCC) 03/16/2015   Type 2 diabetes mellitus with vascular disease (HCC) 06/22/2009   Qualifier: Diagnosis of  By: Lajoyce, RN, BSN, Anne     Uncontrolled diabetes mellitus type 2 with peripheral artery disease 03/16/2015   Past Surgical History:  Procedure Laterality Date   AMPUTATION Left 10/09/2017   Procedure: LEFT 5TH RAY AMPUTATION;  Surgeon: Harden Jerona GAILS, MD;  Location: [facility] OR;  Service: Orthopedics;   Laterality: Left;   CARDIAC CATHETERIZATION     2011   FEMORAL-TIBIAL BYPASS GRAFT Left 05/04/2015   Procedure: LEFT  FEMORAL-BELOW THE KNEE POPLITEAL  ARTERY BYPASS GRAFT USING NON REVERSE LEFT GREATER SAPHENOUS VEIN;  Surgeon: Gaile LELON New, MD;  Location: [facility] OR;  Service: Vascular;  Laterality: Left;   INTRAOPERATIVE ARTERIOGRAM Left 05/04/2015   Procedure: INTRA OPERATIVE ARTERIOGRAM TIMES TWO;  Surgeon: Gaile LELON New, MD;  Location: [facility] OR;  Service: Vascular;  Laterality: Left;   LEG SURGERY     on bone above ankle-not sure which bone left leg 1981   LEG SURGERY  1981   Bone cyst- Left leg   MULTIPLE EXTRACTIONS WITH ALVEOLOPLASTY  10/31/2011   Procedure: MULTIPLE EXTRACION WITH ALVEOLOPLASTY;  Surgeon: Tanda JULIANNA Fanny, DDS;  Location: [facility] OR;  Service: Oral Surgery;  Laterality: N/A;  Extraction of tooth #'s 2, 4,5,6,7,8,9,10,11,12,13,15,21,22,23,24,25,26,27,28, 29, and 31 with alveoloplasty   PERIPHERAL VASCULAR CATHETERIZATION N/A 04/05/2015   Procedure: Abdominal Aortogram w/Lower Extremity;  Surgeon: Redell LITTIE Door, MD;  Location: [facility] INVASIVE CV LAB;  Service: Cardiovascular;  Laterality: N/A;   PR VEIN BYPASS GRAFT,AORTO-FEM-POP     TONSILLECTOMY     TOTAL HIP ARTHROPLASTY Left 12/16/2019   Procedure: LEFT TOTAL HIP ARTHROPLASTY ANTERIOR APPROACH;  Surgeon: Vernetta Lonni GRADE, MD;  Location: [facility] ORS;  Service: Orthopedics;  Laterality: Left;   Allergies  Allergen Reactions   Fenofibrate  Other (See Comments)    Joint pain/cramping/burning    Pioglitazone Swelling and Rash    Edema unspecified peripheral edema   Crestor [Rosuvastatin]    Chlorhexidine  Gluconate Rash   Orange Oil Rash   Soap Other (See Comments)    States that most soaps make him break out/rash-he uses a sensitive skin soap and shampoos as well, has to use specific allergen free soap and shampoo   Smoker?: smoker  (1 ppd x 43 yrs)   PHYSICAL EXAM: General: The patient is alert and oriented x3 in no  acute distress.  Dermatology: Skin is warm, dry and supple bilateral lower extremities. Interspaces are clear of maceration and debris.      Wound 1:  Location: Left lateral midfoot        Depth: Full-thickness to subcutaneous tissue        Wound Border: Macerated        Wound Base: Fibrotic        Drainage: Serous       Odor?:  None        Surrounding Tissue: Localized erythema and calor        Infected?:  Yes        Necrosis?:  Small amount of necrotic tissue seen within the center of the wound.        Pain?:  Minimal to none        Tunneling: No       Dimensions (cm): 1.0 x 0.6 x 0.4 cm    Vascular: Pedal pulses are palpable  Neurological: Light touch sensation absent  Musculoskeletal Exam: Previous left partial fifth ray amputation  ASSESSMENT / PLAN OF CARE: 1. Diabetic ulcer of left midfoot associated with type 2 diabetes mellitus, with fat layer exposed (HCC)   2. Cellulitis of left foot      Meds ordered this encounter  Medications   doxycycline  (VIBRAMYCIN ) 100 MG capsule    Sig: Take 1 capsule (100 mg total) by mouth 2 (two) times daily for 10 days.    Dispense:  20 capsule    Refill:  0   collagenase  (SANTYL ) 250 UNIT/GM ointment    Sig: Apply 1 Application topically daily. Apply thin layer to wound and cover with gauze dressing.  Change once daily.    Dispense:  15 g    Refill:  3   Culture swabs of the wound(s) were obtained today and sent to [facility] labs.  Will start the patient on doxycycline  100 mg p.o. twice daily x 10 days, but informed the patient that this may need to be adjusted based on the wound culture results once received.  The ulceration was sharply debrided of hyperkeratotic and devitalized soft tissue with sterile #312 blade to the level of subcutaneous tissue.  Hemostasis obtained.  Antibiotic ointment and DSD applied.  Reviewed off-loading with patient.  Post debridement dimensions are 1.4 x 0.7 x 0.4 cm  Surgical shoe dispensed for patient to  wear at all times WB.   Reviewed daily dressing changes with patient.  Sent in prescription for collagenase  Santyl  to apply to the wound followed by a dry sterile gauze dressing.  He stated he had 0 dressings at home.  Discussed risks / concerns regarding ulcer with patient and possible sequelae if left untreated.  Stressed importance of infection prevention at home. Short-term goals are: prevent infection, off-load ulcer, heal ulcer Long-term goals are:  prevent recurrence, prevent amputation.   Follow-up every 2 weeks  for the next 1 to 2 months until this wound has healed.   Awanda CHARM Imperial, DPM, FACFAS [facility]     [address] [place], [place] [address]                Office [phone]  Fax [phone]

## 2023-12-24 ENCOUNTER — Telehealth: Payer: Self-pay

## 2023-12-24 NOTE — Telephone Encounter (Signed)
 PA request received from [employer] for Santyl  250 unit/gm ointment. PA submitted and waiting on response.  [name] (Key: [id number]) PA Case ID #: [id number] Rx #: [id number]

## 2023-12-25 NOTE — Telephone Encounter (Signed)
 PA APPROVED

## 2023-12-30 ENCOUNTER — Ambulatory Visit: Payer: Self-pay | Admitting: Podiatry

## 2023-12-30 ENCOUNTER — Other Ambulatory Visit: Payer: Self-pay | Admitting: Podiatry

## 2023-12-30 MED ORDER — SULFAMETHOXAZOLE-TRIMETHOPRIM 800-160 MG PO TABS: 1.0000 | ORAL_TABLET | Freq: Two times a day (BID) | ORAL | 0 refills | Status: AC

## 2023-12-30 MED ORDER — LEVOFLOXACIN 500 MG PO TABS: 500.0000 mg | ORAL_TABLET | Freq: Every day | ORAL | 0 refills | Status: AC

## 2023-12-30 NOTE — Progress Notes (Signed)
 Received patient's wound culture.  He grew out approximately 7 different organisms.  He can go ahead and stop the doxycycline .  I am putting in an order for 2 new antibiotics to start today for 5-day course.  one is levofloxacin  and the other is Bactrim .  Take all of the medication as prescribed.  Will see him as scheduled

## 2023-12-31 NOTE — Progress Notes (Signed)
 Spoke with patient, he will pick up the new ABX today. We will see him in sept.

## 2024-01-05 ENCOUNTER — Ambulatory Visit (INDEPENDENT_AMBULATORY_CARE_PROVIDER_SITE_OTHER)

## 2024-01-05 ENCOUNTER — Ambulatory Visit: Admitting: Podiatry

## 2024-01-05 DIAGNOSIS — E11621 Type 2 diabetes mellitus with foot ulcer: Secondary | ICD-10-CM | POA: Diagnosis not present

## 2024-01-05 DIAGNOSIS — L97422 Non-pressure chronic ulcer of left heel and midfoot with fat layer exposed: Secondary | ICD-10-CM

## 2024-01-05 DIAGNOSIS — L03116 Cellulitis of left lower limb: Secondary | ICD-10-CM

## 2024-01-05 MED ORDER — LEVOFLOXACIN 500 MG PO TABS: 500.0000 mg | ORAL_TABLET | Freq: Every day | ORAL | 0 refills | Status: AC

## 2024-01-05 NOTE — Progress Notes (Unsigned)
 Chief Complaint  Patient presents with   Foot Ulcer    L foot still painful . Redness and drainage.  Diabetic Ac 5.9  on blood thinners   HPI: 68 y.o. male presenting today for follow-up of left plantar lateral midfoot ulceration.  He has taken all of the oral antibiotics that were sent in.  He never picked up the prescription Santyl  ointment.  This did require a prior authorization from his insurance, which was obtained on 12/24/2023, but the patient states that he does not currently have a working phone and he received a letter from his insurance in the mail yesterday.  Therefore, he did not know it was ready for pickup.  He states he will pick it up today.  He states that he has plenty of dressing materials at home.  States that he is not wearing his surgical shoe because he is working on restoring an old car and cannot wear the shoe while he is doing this.  He has previous partial fifth ray amputation on that foot.  Denies fever, chills, night sweats, nausea/vomiting.  Past Medical History:  Diagnosis Date   Acute osteomyelitis of left foot (HCC) 03/16/2015   Asthma    bronchitis to asthma many years   CAD, NATIVE VESSEL 04/16/2008   Qualifier: Diagnosis of  By: Rolan, MD, Dalton     Cancer [facility])    non-hodgkins lymphoma  2000  recd radiation   Cellulitis and abscess of leg, except foot 02/21/2015   Colon cancer screening 01/30/2015   Diabetic ulcer of left foot with necrosis of muscle (HCC) 02/21/2015   Dyslipidemia associated with type 2 diabetes mellitus (HCC) 03/16/2015   Encounter for long-term (current) use of other medications 10/16/2011   Essential hypertension 04/16/2008   Qualifier: Diagnosis of  By: Rolan, MD, Dalton     Hyperlipidemia with target LDL less than 100 04/16/2008        Hypertriglyceridemia 03/26/2014   Left carotid bruit 12/11/2014   Myocardial infarction [facility])    2009   Osteoarthritis of right hip 04/09/2012   PAD (peripheral artery disease) (HCC)  03/26/2014   Peripheral vascular disease, unspecified (HCC) 11/17/2011   Pneumonia    As a child   Septic arthritis of left foot (HCC) 03/16/2015   Type 2 diabetes mellitus with vascular disease (HCC) 06/22/2009   Qualifier: Diagnosis of  By: Lajoyce, RN, BSN, Anne     Uncontrolled diabetes mellitus type 2 with peripheral artery disease 03/16/2015   Past Surgical History:  Procedure Laterality Date   AMPUTATION Left 10/09/2017   Procedure: LEFT 5TH RAY AMPUTATION;  Surgeon: Harden Jerona GAILS, MD;  Location: [facility] OR;  Service: Orthopedics;  Laterality: Left;   CARDIAC CATHETERIZATION     2011   FEMORAL-TIBIAL BYPASS GRAFT Left 05/04/2015   Procedure: LEFT  FEMORAL-BELOW THE KNEE POPLITEAL  ARTERY BYPASS GRAFT USING NON REVERSE LEFT GREATER SAPHENOUS VEIN;  Surgeon: Gaile LELON New, MD;  Location: [facility] OR;  Service: Vascular;  Laterality: Left;   INTRAOPERATIVE ARTERIOGRAM Left 05/04/2015   Procedure: INTRA OPERATIVE ARTERIOGRAM TIMES TWO;  Surgeon: Gaile LELON New, MD;  Location: [facility] OR;  Service: Vascular;  Laterality: Left;   LEG SURGERY     on bone above ankle-not sure which bone left leg 1981   LEG SURGERY  1981   Bone cyst- Left leg   MULTIPLE EXTRACTIONS WITH ALVEOLOPLASTY  10/31/2011   Procedure: MULTIPLE EXTRACION WITH ALVEOLOPLASTY;  Surgeon: Tanda JULIANNA Fanny, DDS;  Location:  [facility] OR;  Service: Oral Surgery;  Laterality: N/A;  Extraction of tooth #'s 2, 4,5,6,7,8,9,10,11,12,13,15,21,22,23,24,25,26,27,28, 29, and 31 with alveoloplasty   PERIPHERAL VASCULAR CATHETERIZATION N/A 04/05/2015   Procedure: Abdominal Aortogram w/Lower Extremity;  Surgeon: Redell LITTIE Door, MD;  Location: [facility] INVASIVE CV LAB;  Service: Cardiovascular;  Laterality: N/A;   PR VEIN BYPASS GRAFT,AORTO-FEM-POP     TONSILLECTOMY     TOTAL HIP ARTHROPLASTY Left 12/16/2019   Procedure: LEFT TOTAL HIP ARTHROPLASTY ANTERIOR APPROACH;  Surgeon: Vernetta Lonni GRADE, MD;  Location: [facility] ORS;  Service: Orthopedics;  Laterality: Left;    Allergies  Allergen Reactions   Fenofibrate  Other (See Comments)    Joint pain/cramping/burning    Pioglitazone Swelling and Rash    Edema unspecified peripheral edema   Crestor [Rosuvastatin]    Chlorhexidine  Gluconate Rash   Orange Oil Rash   Soap Other (See Comments)    States that most soaps make him break out/rash-he uses a sensitive skin soap and shampoos as well, has to use specific allergen free soap and shampoo    PHYSICAL EXAM: General: The patient is alert and oriented x3 in no acute distress.  Dermatology: Skin is warm, dry and supple bilateral lower extremities. Interspaces are clear of maceration and debris.      Wound 1:  Location: Plantar lateral left fifth metatarsal base area        Depth: Full-thickness to subcutaneous tissue        Wound Border: Minimal hyperkeratosis        Wound Base: Fibrous        Drainage: Serosanguineous, minimal       Odor?:  None        Surrounding Tissue: There is still some surrounding erythema and edema but this is significantly improved since last seen.        Infected?:  Resolving        Necrosis?:  None        Pain?:  Minimal        Tunneling: None       Dimensions (cm): 0.8 x 0.7 x 0.3 cm  Vascular: Pedal pulses are 1/4  Neurological: Light touch sensation diminished  RADIOGRAPHIC EXAM (left foot, 3 weightbearing views, 01/05/2024):  Normal osseous mineralization.  No evidence of erosive changes to the fifth metatarsal base are noted.  No gas is seen within the soft tissues in the area.  ASSESSMENT / PLAN OF CARE: 1. Diabetic ulcer of left midfoot associated with type 2 diabetes mellitus, with fat layer exposed (HCC)   2. Cellulitis of left foot      Meds ordered this encounter  Medications   levofloxacin  (LEVAQUIN ) 500 MG tablet    Sig: Take 1 tablet (500 mg total) by mouth daily for 7 days.    Dispense:  7 tablet    Refill:  0   The ulceration was sharply debrided of hyperkeratotic, fibrous and devitalized  soft tissue with sterile #312 blade to the level of subcutaneous tissue.  Hemostasis obtained.  Antibiotic ointment and DSD applied.  Reviewed off-loading with patient.  Patient encouraged to wear his surgical shoe and stay off the foot as much as possible until the wound heals.  Will reach out to the patient to review his x-ray results from today.  At this time no significant changes are noted within the bone to indicate bone infection.  However if the wound remains deep with minimal improvement at future visits now that he will be picking up the Santyl   ointment and applying daily, will most likely set up for MRI to evaluate for possible subacute osteomyelitis of the fifth metatarsal base.  Reviewed daily dressing changes with patient.  He will pick up the Santyl  ointment and apply this followed by gauze dressing once daily.  Prescription for levofloxacin  500 mg 1 tablet p.o. daily x 7 days was sent to his pharmacy to continue with antibiotic treatment.  Ordered CBC with differential, sed rate, and CMP to evaluate markers for infection.  Discussed risks / concerns regarding ulcer with patient and possible sequelae if left untreated.  Stressed importance of infection prevention at home. Short-term goals are: prevent infection, off-load ulcer, heal ulcer Long-term goals are:  prevent recurrence, prevent amputation.   Return in about 2 weeks (around 01/19/2024) for f/u ulcer.   Awanda CHARM Imperial, DPM, FACFAS [facility]     [address] [place], [place] [address]                Office [phone]  Fax [phone]

## 2024-01-06 ENCOUNTER — Other Ambulatory Visit: Payer: Self-pay | Admitting: Podiatry

## 2024-01-06 DIAGNOSIS — E11621 Type 2 diabetes mellitus with foot ulcer: Secondary | ICD-10-CM

## 2024-01-06 DIAGNOSIS — L03116 Cellulitis of left lower limb: Secondary | ICD-10-CM

## 2024-01-06 NOTE — Progress Notes (Signed)
 Patient was reached out to as to where he wants to get his lab work performed.  He had refused to go to any [place] facility and wants to go to his PCP.  New orders were placed in designating other as the preferred facility to perform the lab tests.  Will have to reach out to the patient once the results are sent to us  to review.

## 2024-01-10 ENCOUNTER — Other Ambulatory Visit: Payer: Self-pay

## 2024-01-10 ENCOUNTER — Emergency Department (HOSPITAL_COMMUNITY)
Admission: EM | Admit: 2024-01-10 | Discharge: 2024-01-10 | Disposition: A | Discharge status: Home / Self Care | Attending: Emergency Medicine | Admitting: Emergency Medicine

## 2024-01-10 ENCOUNTER — Encounter (HOSPITAL_COMMUNITY): Payer: Self-pay

## 2024-01-10 DIAGNOSIS — Z7902 Long term (current) use of antithrombotics/antiplatelets: Secondary | ICD-10-CM | POA: Diagnosis not present

## 2024-01-10 DIAGNOSIS — Z79899 Other long term (current) drug therapy: Secondary | ICD-10-CM | POA: Diagnosis not present

## 2024-01-10 DIAGNOSIS — S81012A Laceration without foreign body, left knee, initial encounter: Secondary | ICD-10-CM | POA: Insufficient documentation

## 2024-01-10 DIAGNOSIS — W278XXA Contact with other nonpowered hand tool, initial encounter: Secondary | ICD-10-CM | POA: Insufficient documentation

## 2024-01-10 DIAGNOSIS — S8992XA Unspecified injury of left lower leg, initial encounter: Secondary | ICD-10-CM | POA: Diagnosis present

## 2024-01-10 DIAGNOSIS — Z7982 Long term (current) use of aspirin: Secondary | ICD-10-CM | POA: Diagnosis not present

## 2024-01-10 MED ORDER — LIDOCAINE-EPINEPHRINE (PF) 2 %-1:200000 IJ SOLN
INTRAMUSCULAR | Status: AC
  Administered 2024-01-10: 10 mL via INTRADERMAL
  Filled 2024-01-10: qty 20

## 2024-01-10 MED ORDER — LIDOCAINE-EPINEPHRINE (PF) 2 %-1:200000 IJ SOLN: 10.0000 mL | Freq: Once | INTRAMUSCULAR | Status: AC

## 2024-01-10 MED ORDER — BACITRACIN ZINC 500 UNIT/GM EX OINT
TOPICAL_OINTMENT | Freq: Two times a day (BID) | CUTANEOUS | Status: DC
  Filled 2024-01-10: qty 1.8

## 2024-01-10 NOTE — ED Provider Notes (Signed)
 [place] EMERGENCY DEPARTMENT AT [facility] Provider Note   CSN: [id number] Arrival date & time: 01/10/24  1659     Patient presents with: Laceration   [name] is a 68 y.o. male.   68 yo M with a chief complaints of a left knee laceration.  Patient was sitting on a chair when he was trying to cut up a box and by accident cut through the box and into his knee.  He is on aspirin  and Plavix  and he realized he was having quite a bit of bleeding and came here for evaluation.  He denies any foreign body, tetanus shot is up-to-date.   Laceration      Prior to Admission medications   Medication Sig Start Date End Date Taking? Authorizing Provider  aspirin  81 MG chewable tablet Chew 1 tablet (81 mg total) by mouth daily. 12/18/19   Vernetta Lonni GRADE, MD  cilostazol  (PLETAL ) 100 MG tablet Take 1 tablet (100 mg total) by mouth 2 (two) times daily. Please make yearly appt with Dr. Wonda for April. 1st attempt 05/25/17   Wonda Sharper, MD  clopidogrel  (PLAVIX ) 75 MG tablet TAKE 1 TABLET EVERY DAY 02/10/19   Wonda Sharper, MD  collagenase  (SANTYL ) 250 UNIT/GM ointment Apply 1 Application topically daily. Apply thin layer to wound and cover with gauze dressing.  Change once daily. 12/21/23   McCaughan, Dia D, DPM  glimepiride  (AMARYL ) 1 MG tablet Take 1 mg by mouth daily with breakfast.    [provider]  glucose blood (COOL BLOOD GLUCOSE TEST STRIPS) test strip Use to check blood sugar twice a day  DX. E11.51 05/25/15   Joshua Debby CROME, MD  Insulin  Glargine (TOUJEO  SOLOSTAR) 300 UNIT/ML SOPN Inject 30 Units into the skin daily.     [provider]  Insulin  Pen Needle (NOVOFINE) 32G X 6 MM MISC 1 Act by Does not apply route daily. Use 1 QD 02/01/15   Joshua Debby CROME, MD  isosorbide  mononitrate (IMDUR ) 30 MG 24 hr tablet Take 1 tablet (30 mg total) by mouth 2 (two) times daily. Please make yearly appt with Dr. Wonda for July for future refills. 1st attempt  08/16/18   Wonda Sharper, MD  Anselm Oil 1000 MG CAPS Take 2,000 mg by mouth daily.     [provider]  levofloxacin  (LEVAQUIN ) 500 MG tablet Take 1 tablet (500 mg total) by mouth daily for 7 days. 01/05/24 01/12/24  McCaughan, Dia D, DPM  lisinopril  (ZESTRIL ) 40 MG tablet TAKE 1 TABLET EVERY DAY. KEEP MD APPOINTMENT FOR REFILLS Patient taking differently: Take 40 mg by mouth daily. 02/10/19   Wonda Sharper, MD  metFORMIN  (GLUCOPHAGE ) 1000 MG tablet Take 1,000 mg by mouth 2 (two) times daily with a meal.    [provider]  methocarbamol  (ROBAXIN ) 500 MG tablet Take 1 tablet (500 mg total) by mouth every 6 (six) hours as needed for muscle spasms. 12/17/19   Vernetta Lonni GRADE, MD  metoprolol  tartrate (LOPRESSOR ) 50 MG tablet Take 1.5 tablets (75 mg total) by mouth 2 (two) times daily. Patient taking differently: Take 50 mg by mouth daily.  06/04/18 01/05/24  Wonda Sharper, MD  mupirocin  ointment (BACTROBAN ) 2 % Apply 1 application topically daily. Apply to incision daily 01/18/20   Vernetta Lonni GRADE, MD  oxyCODONE  (OXY IR/ROXICODONE ) 5 MG immediate release tablet Take 1-2 tablets (5-10 mg total) by mouth every 4 (four) hours as needed for moderate pain (pain score 4-6). 12/17/19  Vernetta Lonni GRADE, MD  pravastatin  (PRAVACHOL ) 80 MG tablet Take 1 tablet (80 mg total) by mouth daily. 06/29/18   Wonda Sharper, MD  TRUEPLUS LANCETS 28G MISC Use to test blood sugar twice a day. DX: E11.51 05/09/15   Joshua Debby CROME, MD    Allergies: Fenofibrate , Pioglitazone, Crestor [rosuvastatin], Chlorhexidine  gluconate, Orange oil, and Soap    Review of Systems  Updated Vital Signs BP (!) 167/84 (BP Location: Right Arm)   Pulse 78   Temp 98.2 F (36.8 C) (Oral)   Resp 18   SpO2 100%   Physical Exam Vitals and nursing note reviewed.  Constitutional:      Appearance: He is well-developed.  HENT:     Head: Normocephalic and atraumatic.  Eyes:     Pupils: Pupils are equal,  round, and reactive to light.  Neck:     Vascular: No JVD.  Cardiovascular:     Rate and Rhythm: Normal rate and regular rhythm.     Heart sounds: No murmur heard.    No friction rub. No gallop.  Pulmonary:     Effort: No respiratory distress.     Breath sounds: No wheezing.  Abdominal:     General: There is no distension.     Tenderness: There is no abdominal tenderness. There is no guarding or rebound.  Musculoskeletal:        General: Normal range of motion.     Cervical back: Normal range of motion and neck supple.     Comments: Superficial linear laceration just through the outer layer of skin overlying the patella and extending superiorly.  Skin:    Coloration: Skin is not pale.     Findings: No rash.  Neurological:     Mental Status: He is alert and oriented to person, place, and time.  Psychiatric:        Behavior: Behavior normal.     (all labs ordered are listed, but only abnormal results are displayed) Labs Reviewed - No data to display  EKG: None  Radiology: No results found.   .Laceration Repair  Date/Time: 01/10/2024 5:30 PM  Performed by: Emil Share, DO Authorized by: Emil Share, DO   Consent:    Consent obtained:  Verbal   Consent given by:  Patient   Risks, benefits, and alternatives were discussed: yes     Risks discussed:  Infection, pain, poor cosmetic result and poor wound healing   Alternatives discussed:  No treatment Universal protocol:    Procedure explained and questions answered to patient or proxy's satisfaction: yes     Immediately prior to procedure, a time out was called: yes     Patient identity confirmed:  Verbally with patient Laceration details:    Location:  Leg   Leg location:  L knee   Length (cm):  8.5 Pre-procedure details:    Preparation:  Patient was prepped and draped in usual sterile fashion Exploration:    Hemostasis achieved with:  Epinephrine  and direct pressure   Wound exploration: entire depth of wound  visualized     Wound extent: no underlying fracture and no vascular damage   Treatment:    Area cleansed with:  Chlorhexidine    Amount of cleaning:  Standard   Irrigation solution:  Sterile saline   Irrigation volume:  100   Irrigation method:  Pressure wash   Debridement:  None   Undermining:  None   Scar revision: no   Skin repair:    Repair method:  Sutures  Suture size:  3-0   Suture material:  Nylon   Suture technique:  Simple interrupted   Number of sutures:  5 Approximation:    Approximation:  Close Repair type:    Repair type:  Simple Post-procedure details:    Dressing:  Antibiotic ointment and adhesive bandage   Procedure completion:  Tolerated well, no immediate complications    Medications Ordered in the ED  lidocaine -EPINEPHrine  (XYLOCAINE  W/EPI) 2 %-1:200000 (PF) injection 10 mL (has no administration in time range)  lidocaine -EPINEPHrine  (XYLOCAINE  W/EPI) 2 %-1:200000 (PF) injection (has no administration in time range)  bacitracin  ointment (has no administration in time range)                                    Medical Decision Making Risk OTC drugs. Prescription drug management.   68 yo M with a chief complaint of a left knee laceration.  The patient was trying to cut a box with a box cutter and advertently went through the box and into his knee.  I was notified by nursing because they were concerned for significant amount of bleeding and the patient is on both aspirin  and Plavix .  On exam the wound is very superficial, obviously does not involve the joint.  Bleeding was controlled with lidocaine  epinephrine  and simple pressure.  Wound was repaired at bedside.  Tetanus is reportedly up-to-date.  PCP follow-up.  5:32 PM:  I have discussed the diagnosis/risks/treatment options with the patient and family.  Evaluation and diagnostic testing in the emergency department does not suggest an emergent condition requiring admission or immediate intervention  beyond what has been performed at this time.  They will follow up with PCP. We also discussed returning to the ED immediately if new or worsening sx occur. We discussed the sx which are most concerning (e.g., sudden worsening pain, fever, inability to tolerate by mouth) that necessitate immediate return. Medications administered to the patient during their visit and any new prescriptions provided to the patient are listed below.  Medications given during this visit Medications  lidocaine -EPINEPHrine  (XYLOCAINE  W/EPI) 2 %-1:200000 (PF) injection 10 mL (has no administration in time range)  lidocaine -EPINEPHrine  (XYLOCAINE  W/EPI) 2 %-1:200000 (PF) injection (has no administration in time range)  bacitracin  ointment (has no administration in time range)     The patient appears reasonably screen and/or stabilized for discharge and I doubt any other medical condition or other [facility] requiring further screening, evaluation, or treatment in the ED at this time prior to discharge.       Final diagnoses:  Knee laceration, left, initial encounter    ED Discharge Orders     None          Emil Share, DO 01/10/24 1732

## 2024-01-10 NOTE — Discharge Instructions (Signed)
 Return for redness drainage or if you get a fever.  Sutures placed typically are removed between day 10 and 14, this can be done here in urgent care at your family doctor's office.  The area can get wet but not fully immersed underwater.  No scrubbing.  If you really want to clean it you can apply a half-and-half hydrogen peroxide solution with water  on a Q-tip.  You can apply an ointment a couple times a day this could be as simple as Vaseline but could also be an antibiotic ointment if you wish.

## 2024-01-10 NOTE — ED Triage Notes (Signed)
 Pt presents to ED from home after unintentional laceration to L knee with box cutter. Takes Plavix , bleeding uncontrolled on arrival. Direct pressure held, pressure dressing applied and taken to room. MD made aware. Last tetanus 4 years ago.

## 2024-01-18 ENCOUNTER — Encounter: Payer: Self-pay | Admitting: Podiatry

## 2024-01-18 ENCOUNTER — Ambulatory Visit: Admitting: Podiatry

## 2024-01-18 DIAGNOSIS — E11621 Type 2 diabetes mellitus with foot ulcer: Secondary | ICD-10-CM

## 2024-01-18 DIAGNOSIS — L97422 Non-pressure chronic ulcer of left heel and midfoot with fat layer exposed: Secondary | ICD-10-CM

## 2024-01-18 MED ORDER — SANTYL 250 UNIT/GM EX OINT: 1.0000 | TOPICAL_OINTMENT | Freq: Every day | CUTANEOUS | 3 refills | Status: AC

## 2024-01-18 NOTE — Progress Notes (Unsigned)
 Chief Complaint  Patient presents with   Foot Ulcer    Left lateral foot ulcer. IDDM A1C 5.8. 1 pain. Antibiotic ointment.   HPI: 68 y.o. male presenting today ***  Past Medical History:  Diagnosis Date   Acute osteomyelitis of left foot (HCC) 03/16/2015   Asthma    bronchitis to asthma many years   CAD, NATIVE VESSEL 04/16/2008   Qualifier: Diagnosis of  By: Rolan, MD, Dalton     Cancer [facility])    non-hodgkins lymphoma  2000  recd radiation   Cellulitis and abscess of leg, except foot 02/21/2015   Colon cancer screening 01/30/2015   Diabetic ulcer of left foot with necrosis of muscle (HCC) 02/21/2015   Dyslipidemia associated with type 2 diabetes mellitus (HCC) 03/16/2015   Encounter for long-term (current) use of other medications 10/16/2011   Essential hypertension 04/16/2008   Qualifier: Diagnosis of  By: Rolan, MD, Dalton     Hyperlipidemia with target LDL less than 100 04/16/2008        Hypertriglyceridemia 03/26/2014   Left carotid bruit 12/11/2014   Myocardial infarction [facility])    2009   Osteoarthritis of right hip 04/09/2012   PAD (peripheral artery disease) (HCC) 03/26/2014   Peripheral vascular disease, unspecified (HCC) 11/17/2011   Pneumonia    As a child   Septic arthritis of left foot (HCC) 03/16/2015   Type 2 diabetes mellitus with vascular disease (HCC) 06/22/2009   Qualifier: Diagnosis of  By: Lajoyce, RN, BSN, Anne     Uncontrolled diabetes mellitus type 2 with peripheral artery disease 03/16/2015   Past Surgical History:  Procedure Laterality Date   AMPUTATION Left 10/09/2017   Procedure: LEFT 5TH RAY AMPUTATION;  Surgeon: Harden Jerona GAILS, MD;  Location[facility] OR;  Service: Orthopedics;  Laterality: Left;   CARDIAC CATHETERIZATION     2011   FEMORAL-TIBIAL BYPASS GRAFT Left 05/04/2015   Procedure: LEFT  FEMORAL-BELOW THE KNEE POPLITEAL  ARTERY BYPASS GRAFT USING NON REVERSE LEFT GREATER SAPHENOUS VEIN;  Surgeon: Gaile LELON New, MD;  Location: [facility] OR;   Service: Vascular;  Laterality: Left;   INTRAOPERATIVE ARTERIOGRAM Left 05/04/2015   Procedure: INTRA OPERATIVE ARTERIOGRAM TIMES TWO;  Surgeon: Gaile LELON New, MD;  Location: [facility] OR;  Service: Vascular;  Laterality: Left;   LEG SURGERY     on bone above ankle-not sure which bone left leg 1981   LEG SURGERY  1981   Bone cyst- Left leg   MULTIPLE EXTRACTIONS WITH ALVEOLOPLASTY  10/31/2011   Procedure: MULTIPLE EXTRACION WITH ALVEOLOPLASTY;  Surgeon: Tanda JULIANNA Fanny, DDS;  Location: [facility] OR;  Service: Oral Surgery;  Laterality: N/A;  Extraction of tooth #'s 2, 4,5,6,7,8,9,10,11,12,13,15,21,22,23,24,25,26,27,28, 29, and 31 with alveoloplasty   PERIPHERAL VASCULAR CATHETERIZATION N/A 04/05/2015   Procedure: Abdominal Aortogram w/Lower Extremity;  Surgeon: Redell LITTIE Door, MD;  Location: [facility] INVASIVE CV LAB;  Service: Cardiovascular;  Laterality: N/A;   PR VEIN BYPASS GRAFT,AORTO-FEM-POP     TONSILLECTOMY     TOTAL HIP ARTHROPLASTY Left 12/16/2019   Procedure: LEFT TOTAL HIP ARTHROPLASTY ANTERIOR APPROACH;  Surgeon: Vernetta Lonni GRADE, MD;  Location: [facility] ORS;  Service: Orthopedics;  Laterality: Left;   Allergies  Allergen Reactions   Fenofibrate  Other (See Comments)    Joint pain/cramping/burning    Pioglitazone Swelling and Rash    Edema unspecified peripheral edema   Crestor [Rosuvastatin]    Chlorhexidine  Gluconate Rash   Orange Oil Rash   Soap Other (See Comments)  States that most soaps make him break out/rash-he uses a sensitive skin soap and shampoos as well, has to use specific allergen free soap and shampoo   {Smoker?:15292:::1}   PHYSICAL EXAM: There were no vitals filed for this visit.  General: The patient is alert and oriented x3 in no acute distress.  Dermatology: Skin is warm, dry and supple bilateral lower extremities. Interspaces are clear of maceration and debris.      Wound 1:  Location:  ***        Depth:  ***        Wound Border:  ***        Wound Base:   ***        Drainage: ***       Odor?:  ***        Surrounding Tissue:  ***        Infected?:  ***        Necrosis?:  ***        Pain?:  ***        Tunneling:  ***       Dimensions (cm):  ***   Vascular: Pedal pulses are ***  Neurological: Light touch sensation ***  Musculoskeletal Exam: ***      No data to display           RADIOGRAPHIC EXAM:  Normal osseous mineralization.   ASSESSMENT / PLAN OF CARE: No diagnosis found.   No orders of the defined types were placed in this encounter.  None  Culture swabs of the wound(s) were obtained today.  The ulceration was sharply debrided of hyperkeratotic and devitalized soft tissue with sterile #312 blade to the level of *** .  Hemostasis obtained.  Antibiotic ointment and DSD applied.  Reviewed off-loading with patient.  Surgical shoe dispensed for patient to wear at all times WB.   Reviewed daily dressing changes with patient.  Discussed risks / concerns regarding ulcer with patient and possible sequelae if left untreated.  Stressed importance of infection prevention at home. Short-term goals are: prevent infection, off-load ulcer, heal ulcer Long-term goals are:  prevent recurrence, prevent amputation.   No follow-ups on file.   Awanda CHARM Imperial, DPM, FACFAS [facility]     [address] [place], [place] [address]                Office [phone]  Fax [phone]

## 2024-01-19 ENCOUNTER — Telehealth: Payer: Self-pay | Admitting: Lab

## 2024-01-19 MED ORDER — MEDIHONEY WOUND/BURN DRESSING EX GEL: 1.0000 | Freq: Every day | CUTANEOUS | 3 refills | Status: AC

## 2024-01-19 NOTE — Addendum Note (Signed)
 Addended byBETHA LOEL LAIS D on: 01/19/2024 06:45 PM   Modules accepted: Orders

## 2024-01-19 NOTE — Telephone Encounter (Signed)
 Patient states medication is to expensive please review and advise.

## 2024-01-19 NOTE — Telephone Encounter (Signed)
 Patient called back stating that the collagenase  Santyl  ointment was too expensive.  He refuses to pay for it and apply it to his wound that has gone untreated for several weeks since he has not been putting any type of ointment on the wound directly.  Will send in Medihoney gel to his [facility] on file and see if this is more affordable for the patient.  If not, he will need to be referred to wound care as an urgent referral, as we are then out of options and he is at high risk for bone infection due to the current depth of the wound.

## 2024-01-21 DIAGNOSIS — L97529 Non-pressure chronic ulcer of other part of left foot with unspecified severity: Secondary | ICD-10-CM | POA: Diagnosis not present

## 2024-01-21 DIAGNOSIS — N1831 Chronic kidney disease, stage 3a: Secondary | ICD-10-CM | POA: Diagnosis not present

## 2024-01-21 DIAGNOSIS — Z23 Encounter for immunization: Secondary | ICD-10-CM | POA: Diagnosis not present

## 2024-01-21 DIAGNOSIS — E1122 Type 2 diabetes mellitus with diabetic chronic kidney disease: Secondary | ICD-10-CM | POA: Diagnosis not present

## 2024-01-21 DIAGNOSIS — S81012A Laceration without foreign body, left knee, initial encounter: Secondary | ICD-10-CM | POA: Diagnosis not present

## 2024-01-21 DIAGNOSIS — S98132A Complete traumatic amputation of one left lesser toe, initial encounter: Secondary | ICD-10-CM | POA: Diagnosis not present

## 2024-01-22 ENCOUNTER — Ambulatory Visit: Payer: Self-pay | Admitting: Podiatry

## 2024-02-01 ENCOUNTER — Encounter: Payer: Self-pay | Admitting: Podiatry

## 2024-02-01 ENCOUNTER — Ambulatory Visit: Admitting: Podiatry

## 2024-02-01 DIAGNOSIS — L97422 Non-pressure chronic ulcer of left heel and midfoot with fat layer exposed: Secondary | ICD-10-CM | POA: Diagnosis not present

## 2024-02-01 DIAGNOSIS — E11621 Type 2 diabetes mellitus with foot ulcer: Secondary | ICD-10-CM | POA: Diagnosis not present

## 2024-02-01 NOTE — Progress Notes (Signed)
 Chief Complaint  Patient presents with   Foot Ulcer    Left lateral foot ulcer. IDDM A1C 5.8. 1 pain. Medi honey ointment.    HPI: 68 y.o. male presenting today for follow-up of ulcer left foot.  He states that he finally was able to get the Medihoney.  [facility] was out of the product and he did end up purchasing this on [address] [employer].  He feels that the Medihoney has caused a big improvement with the wound.  He notes no stinging or burning pain to the area anymore.  He feels like it is getting better now.  Past Medical History:  Diagnosis Date   Acute osteomyelitis of left foot (HCC) 03/16/2015   Asthma    bronchitis to asthma many years   CAD, NATIVE VESSEL 04/16/2008   Qualifier: Diagnosis of  By: Rolan, MD, Dalton     Cancer [facility])    non-hodgkins lymphoma  2000  recd radiation   Cellulitis and abscess of leg, except foot 02/21/2015   Colon cancer screening 01/30/2015   Diabetic ulcer of left foot with necrosis of muscle (HCC) 02/21/2015   Dyslipidemia associated with type 2 diabetes mellitus (HCC) 03/16/2015   Encounter for long-term (current) use of other medications 10/16/2011   Essential hypertension 04/16/2008   Qualifier: Diagnosis of  By: Rolan, MD, Dalton     Hyperlipidemia with target LDL less than 100 04/16/2008        Hypertriglyceridemia 03/26/2014   Left carotid bruit 12/11/2014   Myocardial infarction [facility])    2009   Osteoarthritis of right hip 04/09/2012   PAD (peripheral artery disease) 03/26/2014   Peripheral vascular disease, unspecified 11/17/2011   Pneumonia    As a child   Septic arthritis of left foot (HCC) 03/16/2015   Type 2 diabetes mellitus with vascular disease (HCC) 06/22/2009   Qualifier: Diagnosis of  By: Lajoyce, RN, BSN, Anne     Uncontrolled diabetes mellitus type 2 with peripheral artery disease 03/16/2015   Past Surgical History:  Procedure Laterality Date   AMPUTATION Left 10/09/2017   Procedure: LEFT 5TH RAY AMPUTATION;  Surgeon: Harden Jerona GAILS,  MD;  Location: [facility] OR;  Service: Orthopedics;  Laterality: Left;   CARDIAC CATHETERIZATION     2011   FEMORAL-TIBIAL BYPASS GRAFT Left 05/04/2015   Procedure: LEFT  FEMORAL-BELOW THE KNEE POPLITEAL  ARTERY BYPASS GRAFT USING NON REVERSE LEFT GREATER SAPHENOUS VEIN;  Surgeon: Gaile LELON New, MD;  Location: [facility] OR;  Service: Vascular;  Laterality: Left;   INTRAOPERATIVE ARTERIOGRAM Left 05/04/2015   Procedure: INTRA OPERATIVE ARTERIOGRAM TIMES TWO;  Surgeon: Gaile LELON New, MD;  Location: [facility] OR;  Service: Vascular;  Laterality: Left;   LEG SURGERY     on bone above ankle-not sure which bone left leg 1981   LEG SURGERY  1981   Bone cyst- Left leg   MULTIPLE EXTRACTIONS WITH ALVEOLOPLASTY  10/31/2011   Procedure: MULTIPLE EXTRACION WITH ALVEOLOPLASTY;  Surgeon: Tanda JULIANNA Fanny, DDS;  Location: [facility] OR;  Service: Oral Surgery;  Laterality: N/A;  Extraction of tooth #'s 2, 4,5,6,7,8,9,10,11,12,13,15,21,22,23,24,25,26,27,28, 29, and 31 with alveoloplasty   PERIPHERAL VASCULAR CATHETERIZATION N/A 04/05/2015   Procedure: Abdominal Aortogram w/Lower Extremity;  Surgeon: Redell LITTIE Door, MD;  Location: [facility] INVASIVE CV LAB;  Service: Cardiovascular;  Laterality: N/A;   PR VEIN BYPASS GRAFT,AORTO-FEM-POP     TONSILLECTOMY     TOTAL HIP ARTHROPLASTY Left 12/16/2019   Procedure: LEFT TOTAL HIP ARTHROPLASTY ANTERIOR APPROACH;  Surgeon: Vernetta Lonni GRADE, MD;  Location: [facility] ORS;  Service: Orthopedics;  Laterality: Left;   Allergies  Allergen Reactions   Fenofibrate  Other (See Comments)    Joint pain/cramping/burning    Pioglitazone Swelling and Rash    Edema unspecified peripheral edema   Crestor [Rosuvastatin]    Chlorhexidine  Gluconate Rash   Orange Oil Rash   Soap Other (See Comments)    States that most soaps make him break out/rash-he uses a sensitive skin soap and shampoos as well, has to use specific allergen free soap and shampoo    PHYSICAL EXAM: General: The patient is alert and oriented  x3 in no acute distress.  Dermatology: Skin is warm, dry and supple bilateral lower extremities. Interspaces are clear of maceration and debris.      Wound 1:  Location: Plantar lateral left fifth metatarsal base        Depth: Full-thickness to subcutaneous fat        Wound Border: Hyperkeratotic        Wound Base: 80% fibrotic, 20% granular        Drainage: Minimal serosanguineous       Odor?:  None        Surrounding Tissue: No erythema or edema or calor        Infected?:  No        Necrosis?:  No        Pain?:  No        Tunneling: No       Dimensions (cm): 0.8 x 0.4 x 0.3 cm  Vascular: Pedal pulses are palpable   ASSESSMENT / PLAN OF CARE: 1. Diabetic ulcer of left midfoot associated with type 2 diabetes mellitus, with fat layer exposed (HCC)     I reviewed patient's blood work with him again which was normal.  He states he did receive a call this morning to go over those results from a staff member.  The ulceration was sharply debrided of hyperkeratotic and devitalized soft tissue with sterile #312 blade to the level of subcutaneous fat.  Hemostasis obtained.  Antibiotic ointment and DSD applied.  Reviewed off-loading with patient.  Reviewed daily dressing changes with patient.  He will continue with the Medihoney and a gauze dressing daily.  Discussed risks / concerns regarding ulcer with patient and possible sequelae if left untreated.  Stressed importance of infection prevention at home. Short-term goals are: prevent infection, off-load ulcer, heal ulcer Long-term goals are:  prevent recurrence, prevent amputation.   Return for as scheduled for ulcer f/u in 2 weeks.   Awanda CHARM Imperial, DPM, FACFAS [facility]     [address] [place], [place] [address]                Office [phone]  Fax [phone]

## 2024-02-01 NOTE — Progress Notes (Signed)
 Spoke with patient, he has apt at 1;15 TODAY, blood work normal. CT

## 2024-02-15 ENCOUNTER — Ambulatory Visit: Admitting: Podiatry

## 2024-02-15 DIAGNOSIS — L97422 Non-pressure chronic ulcer of left heel and midfoot with fat layer exposed: Secondary | ICD-10-CM | POA: Diagnosis not present

## 2024-02-15 DIAGNOSIS — E11621 Type 2 diabetes mellitus with foot ulcer: Secondary | ICD-10-CM

## 2024-02-15 NOTE — Progress Notes (Unsigned)
 Chief Complaint  Patient presents with   Foot Ulcer    Left foot plantar ulcer. IDDM A1C 5.6.     HPI: 68 y.o. male presenting today for follow-up of ulcer left foot.  He finally was able to get the Medihoney and has been placing it on the wound.  He feels that overall it is getting better but he cannot see the area very well himself.  He notes that it does not cause as much discomfort as it was previously.  He has been covering with a dressing.  He has not been staying off of the foot because he still working on restoring his old car.  Past Medical History:  Diagnosis Date   Acute osteomyelitis of left foot (HCC) 03/16/2015   Asthma    bronchitis to asthma many years   CAD, NATIVE VESSEL 04/16/2008   Qualifier: Diagnosis of  By: Rolan, MD, Dalton     Cancer [facility])    non-hodgkins lymphoma  2000  recd radiation   Cellulitis and abscess of leg, except foot 02/21/2015   Colon cancer screening 01/30/2015   Diabetic ulcer of left foot with necrosis of muscle (HCC) 02/21/2015   Dyslipidemia associated with type 2 diabetes mellitus (HCC) 03/16/2015   Encounter for long-term (current) use of other medications 10/16/2011   Essential hypertension 04/16/2008   Qualifier: Diagnosis of  By: Rolan, MD, Dalton     Hyperlipidemia with target LDL less than 100 04/16/2008        Hypertriglyceridemia 03/26/2014   Left carotid bruit 12/11/2014   Myocardial infarction [facility])    2009   Osteoarthritis of right hip 04/09/2012   PAD (peripheral artery disease) 03/26/2014   Peripheral vascular disease, unspecified 11/17/2011   Pneumonia    As a child   Septic arthritis of left foot (HCC) 03/16/2015   Type 2 diabetes mellitus with vascular disease (HCC) 06/22/2009   Qualifier: Diagnosis of  By: Lajoyce, RN, BSN, Anne     Uncontrolled diabetes mellitus type 2 with peripheral artery disease 03/16/2015   Past Surgical History:  Procedure Laterality Date   AMPUTATION Left 10/09/2017   Procedure: LEFT 5TH  RAY AMPUTATION;  Surgeon: Harden Jerona GAILS, MD;  Location: [facility] OR;  Service: Orthopedics;  Laterality: Left;   CARDIAC CATHETERIZATION     2011   FEMORAL-TIBIAL BYPASS GRAFT Left 05/04/2015   Procedure: LEFT  FEMORAL-BELOW THE KNEE POPLITEAL  ARTERY BYPASS GRAFT USING NON REVERSE LEFT GREATER SAPHENOUS VEIN;  Surgeon: Gaile LELON New, MD;  Location: [facility] OR;  Service: Vascular;  Laterality: Left;   INTRAOPERATIVE ARTERIOGRAM Left 05/04/2015   Procedure: INTRA OPERATIVE ARTERIOGRAM TIMES TWO;  Surgeon: Gaile LELON New, MD;  Location: [facility] OR;  Service: Vascular;  Laterality: Left;   LEG SURGERY     on bone above ankle-not sure which bone left leg 1981   LEG SURGERY  1981   Bone cyst- Left leg   MULTIPLE EXTRACTIONS WITH ALVEOLOPLASTY  10/31/2011   Procedure: MULTIPLE EXTRACION WITH ALVEOLOPLASTY;  Surgeon: Tanda JULIANNA Fanny, DDS;  Location: [facility] OR;  Service: Oral Surgery;  Laterality: N/A;  Extraction of tooth #'s 2, 4,5,6,7,8,9,10,11,12,13,15,21,22,23,24,25,26,27,28, 29, and 31 with alveoloplasty   PERIPHERAL VASCULAR CATHETERIZATION N/A 04/05/2015   Procedure: Abdominal Aortogram w/Lower Extremity;  Surgeon: Redell LITTIE Door, MD;  Location: [facility] INVASIVE CV LAB;  Service: Cardiovascular;  Laterality: N/A;   PR VEIN BYPASS GRAFT,AORTO-FEM-POP     TONSILLECTOMY     TOTAL HIP ARTHROPLASTY Left 12/16/2019   Procedure:  LEFT TOTAL HIP ARTHROPLASTY ANTERIOR APPROACH;  Surgeon: Vernetta Lonni GRADE, MD;  Location: [facility] ORS;  Service: Orthopedics;  Laterality: Left;   Allergies  Allergen Reactions   Fenofibrate  Other (See Comments)    Joint pain/cramping/burning    Pioglitazone Swelling and Rash    Edema unspecified peripheral edema   Crestor [Rosuvastatin]    Chlorhexidine  Gluconate Rash   Orange Oil Rash   Soap Other (See Comments)    States that most soaps make him break out/rash-he uses a sensitive skin soap and shampoos as well, has to use specific allergen free soap and shampoo    PHYSICAL  EXAM: General: The patient is alert and oriented x3 in no acute distress.  Dermatology: Skin is warm, dry and supple bilateral lower extremities. Interspaces are clear of maceration and debris.      Wound 1:  Location: Plantar lateral left fifth metatarsal base        Depth: Full-thickness to subcutaneous fat        Wound Border: Hyperkeratotic        Wound Base: 60 % fibrotic, 40% granular        Drainage: Minimal serosanguineous       Odor?:  None        Surrounding Tissue: No erythema or edema or calor        Infected?:  No        Necrosis?:  No        Pain?:  No        Tunneling: No       Dimensions (cm): 0.7 x 0.4 x 0.3  Vascular: Pedal pulses are palpable   ASSESSMENT / PLAN OF CARE: 1. Diabetic ulcer of left midfoot associated with type 2 diabetes mellitus, with fat layer exposed (HCC)     Informed patient that the wound does appear slightly smaller today.  This is a good sign.  The depth is slightly decreased as well.  Encouraged him to continue trying to stay off the foot is much as possible, use the Medihoney, and keep wrapped with a sterile gauze wrapping.  The ulceration on the left foot was sharply debrided of hyperkeratotic and devitalized soft tissue with sterile #312 blade to the level of subcutaneous fat.  Hemostasis obtained.  Antibiotic ointment and DSD applied.  Reviewed off-loading with patient.  Reviewed daily dressing changes with patient.  He will continue with the Medihoney and a gauze dressing daily.  Discussed risks / concerns regarding ulcer with patient and possible sequelae if left untreated.  Stressed importance of infection prevention at home. Short-term goals are: prevent infection, off-load ulcer, heal ulcer Long-term goals are:  prevent recurrence, prevent amputation.   Return for schedule 3 appointments, 2-3 weeks apart, for ulcer rechecks.   Awanda CHARM Imperial, DPM, FACFAS [facility]     [address] [place], [place] [address]                Office [phone]  Fax [phone]

## 2024-02-29 ENCOUNTER — Encounter: Payer: Self-pay | Admitting: Podiatry

## 2024-02-29 ENCOUNTER — Ambulatory Visit: Admitting: Podiatry

## 2024-02-29 DIAGNOSIS — L97422 Non-pressure chronic ulcer of left heel and midfoot with fat layer exposed: Secondary | ICD-10-CM

## 2024-02-29 DIAGNOSIS — E11621 Type 2 diabetes mellitus with foot ulcer: Secondary | ICD-10-CM

## 2024-02-29 NOTE — Progress Notes (Signed)
 Chief Complaint  Patient presents with   Foot Ulcer    Left lateral foot ulcer. 2 pain. Medi honey dressing changes daily. NIDDM A1C 5.8.   HPI: 68 y.o. male presenting today for follow-up of ulceration on the plantar lateral aspect of the left fifth metatarsal base.  He feels that is getting better.  He has less stinging sensations in the area.  He is still avoiding wearing the surgical shoe and is in house slippers today.  He is still working on restoring his old car and will not wear the surgical shoe.  He is using the Medihoney ointment on the ulceration followed by gauze wrapping.  Past Medical History:  Diagnosis Date   Acute osteomyelitis of left foot (HCC) 03/16/2015   Asthma    bronchitis to asthma many years   CAD, NATIVE VESSEL 04/16/2008   Qualifier: Diagnosis of  By: Rolan, MD, Dalton     Cancer [facility])    non-hodgkins lymphoma  2000  recd radiation   Cellulitis and abscess of leg, except foot 02/21/2015   Colon cancer screening 01/30/2015   Diabetic ulcer of left foot with necrosis of muscle (HCC) 02/21/2015   Dyslipidemia associated with type 2 diabetes mellitus (HCC) 03/16/2015   Encounter for long-term (current) use of other medications 10/16/2011   Essential hypertension 04/16/2008   Qualifier: Diagnosis of  By: Rolan, MD, Dalton     Hyperlipidemia with target LDL less than 100 04/16/2008        Hypertriglyceridemia 03/26/2014   Left carotid bruit 12/11/2014   Myocardial infarction [facility])    2009   Osteoarthritis of right hip 04/09/2012   PAD (peripheral artery disease) 03/26/2014   Peripheral vascular disease, unspecified 11/17/2011   Pneumonia    As a child   Septic arthritis of left foot (HCC) 03/16/2015   Type 2 diabetes mellitus with vascular disease (HCC) 06/22/2009   Qualifier: Diagnosis of  By: Lajoyce, RN, BSN, Anne     Uncontrolled diabetes mellitus type 2 with peripheral artery disease 03/16/2015   Past Surgical History:  Procedure Laterality Date    AMPUTATION Left 10/09/2017   Procedure: LEFT 5TH RAY AMPUTATION;  Surgeon: Harden Jerona GAILS, MD;  Location: [facility] OR;  Service: Orthopedics;  Laterality: Left;   CARDIAC CATHETERIZATION     2011   FEMORAL-TIBIAL BYPASS GRAFT Left 05/04/2015   Procedure: LEFT  FEMORAL-BELOW THE KNEE POPLITEAL  ARTERY BYPASS GRAFT USING NON REVERSE LEFT GREATER SAPHENOUS VEIN;  Surgeon: Gaile LELON New, MD;  Location: [facility] OR;  Service: Vascular;  Laterality: Left;   INTRAOPERATIVE ARTERIOGRAM Left 05/04/2015   Procedure: INTRA OPERATIVE ARTERIOGRAM TIMES TWO;  Surgeon: Gaile LELON New, MD;  Location: [facility] OR;  Service: Vascular;  Laterality: Left;   LEG SURGERY     on bone above ankle-not sure which bone left leg 1981   LEG SURGERY  1981   Bone cyst- Left leg   MULTIPLE EXTRACTIONS WITH ALVEOLOPLASTY  10/31/2011   Procedure: MULTIPLE EXTRACION WITH ALVEOLOPLASTY;  Surgeon: Tanda JULIANNA Fanny, DDS;  Location: [facility] OR;  Service: Oral Surgery;  Laterality: N/A;  Extraction of tooth #'s 2, 4,5,6,7,8,9,10,11,12,13,15,21,22,23,24,25,26,27,28, 29, and 31 with alveoloplasty   PERIPHERAL VASCULAR CATHETERIZATION N/A 04/05/2015   Procedure: Abdominal Aortogram w/Lower Extremity;  Surgeon: Redell LITTIE Door, MD;  Location: [facility] INVASIVE CV LAB;  Service: Cardiovascular;  Laterality: N/A;   PR VEIN BYPASS GRAFT,AORTO-FEM-POP     TONSILLECTOMY     TOTAL HIP ARTHROPLASTY Left 12/16/2019   Procedure:  LEFT TOTAL HIP ARTHROPLASTY ANTERIOR APPROACH;  Surgeon: Vernetta Lonni GRADE, MD;  Location: [facility] ORS;  Service: Orthopedics;  Laterality: Left;   Allergies  Allergen Reactions   Fenofibrate  Other (See Comments)    Joint pain/cramping/burning    Pioglitazone Swelling and Rash    Edema unspecified peripheral edema   Crestor [Rosuvastatin]    Chlorhexidine  Gluconate Rash   Orange Oil Rash   Soap Other (See Comments)    States that most soaps make him break out/rash-he uses a sensitive skin soap and shampoos as well, has to use specific  allergen free soap and shampoo   Smoker?: smoker  (1 ppd x 43 yrs)   PHYSICAL EXAM: General: The patient is alert and oriented x3 in no acute distress.  Dermatology: Skin is warm, dry and supple bilateral lower extremities. Interspaces are clear of maceration and debris.      Wound 1:  Location: Plantar lateral left fifth metatarsal base        Depth: Full-thickness to subcutaneous tissue        Wound Border: Minimal hyperkeratosis        Wound Base: Fibrogranular        Drainage: Minimal serosanguineous       Odor?:  None        Surrounding Tissue: No erythema or edema        Infected?:  No        Necrosis?:  No        Pain?:  Mild        Tunneling: No       Dimensions (cm): 1.0 x 0.6 x 0.3 cm postdebridement (photo is predebridement)    Vascular: Pedal pulses are trace palpable  Neurological: Light touch sensation diminished  ASSESSMENT / PLAN OF CARE: 1. Diabetic ulcer of left midfoot associated with type 2 diabetes mellitus, with fat layer exposed (HCC)     The ulceration was sharply debrided of hyperkeratotic and devitalized soft tissue with sterile #312 blade to the level of subcutaneous tissue.  Hemostasis obtained.  Antibiotic ointment and DSD applied.  Reviewed off-loading with patient.  Reviewed daily dressing changes with patient.  He will continue with the Medihoney and gauze dressing daily.  Discussed risks / concerns regarding ulcer with patient and possible sequelae if left untreated.  Stressed importance of infection prevention at home. Short-term goals are: prevent infection, off-load ulcer, heal ulcer Long-term goals are:  prevent recurrence, prevent amputation.   He has his next follow-up already scheduled in 2 to 3 weeks.   Awanda CHARM Imperial, DPM, FACFAS [facility]     [address] [place], [place] [address]                Office [phone]  Fax [phone]

## 2024-03-14 ENCOUNTER — Encounter: Payer: Self-pay | Admitting: Podiatry

## 2024-03-14 ENCOUNTER — Ambulatory Visit: Admitting: Podiatry

## 2024-03-14 DIAGNOSIS — L97422 Non-pressure chronic ulcer of left heel and midfoot with fat layer exposed: Secondary | ICD-10-CM | POA: Diagnosis not present

## 2024-03-14 DIAGNOSIS — E11621 Type 2 diabetes mellitus with foot ulcer: Secondary | ICD-10-CM

## 2024-03-14 NOTE — Progress Notes (Signed)
 Chief Complaint  Patient presents with   Foot Ulcer    2 wk Left foot lateral ulcer check. Using medi honey guaze dressing. O pain. IDDM A1C 5.8    HPI: 68 y.o. male presenting today for follow-up of ulcer left lateral midfoot.  He feels that it is getting better because he does not have as much stinging pain in the morning.  He is still using the Medihoney on the wound.  He is still working on his car and therefore fully weightbearing at all times without the use of any surgical shoe which he refuses to wear.  Past Medical History:  Diagnosis Date   Acute osteomyelitis of left foot (HCC) 03/16/2015   Asthma    bronchitis to asthma many years   CAD, NATIVE VESSEL 04/16/2008   Qualifier: Diagnosis of  By: Rolan, MD, Dalton     Cancer [facility])    non-hodgkins lymphoma  2000  recd radiation   Cellulitis and abscess of leg, except foot 02/21/2015   Colon cancer screening 01/30/2015   Diabetic ulcer of left foot with necrosis of muscle (HCC) 02/21/2015   Dyslipidemia associated with type 2 diabetes mellitus (HCC) 03/16/2015   Encounter for long-term (current) use of other medications 10/16/2011   Essential hypertension 04/16/2008   Qualifier: Diagnosis of  By: Rolan, MD, Dalton     Hyperlipidemia with target LDL less than 100 04/16/2008        Hypertriglyceridemia 03/26/2014   Left carotid bruit 12/11/2014   Myocardial infarction [facility])    2009   Osteoarthritis of right hip 04/09/2012   PAD (peripheral artery disease) 03/26/2014   Peripheral vascular disease, unspecified 11/17/2011   Pneumonia    As a child   Septic arthritis of left foot (HCC) 03/16/2015   Type 2 diabetes mellitus with vascular disease (HCC) 06/22/2009   Qualifier: Diagnosis of  By: Lajoyce, RN, BSN, Anne     Uncontrolled diabetes mellitus type 2 with peripheral artery disease 03/16/2015   Past Surgical History:  Procedure Laterality Date   AMPUTATION Left 10/09/2017   Procedure: LEFT 5TH RAY AMPUTATION;  Surgeon:  Harden Jerona GAILS, MD;  Location: [facility] OR;  Service: Orthopedics;  Laterality: Left;   CARDIAC CATHETERIZATION     2011   FEMORAL-TIBIAL BYPASS GRAFT Left 05/04/2015   Procedure: LEFT  FEMORAL-BELOW THE KNEE POPLITEAL  ARTERY BYPASS GRAFT USING NON REVERSE LEFT GREATER SAPHENOUS VEIN;  Surgeon: Gaile LELON New, MD;  Location: [facility] OR;  Service: Vascular;  Laterality: Left;   INTRAOPERATIVE ARTERIOGRAM Left 05/04/2015   Procedure: INTRA OPERATIVE ARTERIOGRAM TIMES TWO;  Surgeon: Gaile LELON New, MD;  Location: [facility] OR;  Service: Vascular;  Laterality: Left;   LEG SURGERY     on bone above ankle-not sure which bone left leg 1981   LEG SURGERY  1981   Bone cyst- Left leg   MULTIPLE EXTRACTIONS WITH ALVEOLOPLASTY  10/31/2011   Procedure: MULTIPLE EXTRACION WITH ALVEOLOPLASTY;  Surgeon: Tanda JULIANNA Fanny, DDS;  Location: [facility] OR;  Service: Oral Surgery;  Laterality: N/A;  Extraction of tooth #'s 2, 4,5,6,7,8,9,10,11,12,13,15,21,22,23,24,25,26,27,28, 29, and 31 with alveoloplasty   PERIPHERAL VASCULAR CATHETERIZATION N/A 04/05/2015   Procedure: Abdominal Aortogram w/Lower Extremity;  Surgeon: Redell LITTIE Door, MD;  Location: [facility] INVASIVE CV LAB;  Service: Cardiovascular;  Laterality: N/A;   PR VEIN BYPASS GRAFT,AORTO-FEM-POP     TONSILLECTOMY     TOTAL HIP ARTHROPLASTY Left 12/16/2019   Procedure: LEFT TOTAL HIP ARTHROPLASTY ANTERIOR APPROACH;  Surgeon: Vernetta,  Lonni GRADE, MD;  Location: [facility] ORS;  Service: Orthopedics;  Laterality: Left;   Allergies  Allergen Reactions   Fenofibrate  Other (See Comments)    Joint pain/cramping/burning    Pioglitazone Swelling and Rash    Edema unspecified peripheral edema   Crestor [Rosuvastatin]    Chlorhexidine  Gluconate Rash   Orange Oil Rash   Soap Other (See Comments)    States that most soaps make him break out/rash-he uses a sensitive skin soap and shampoos as well, has to use specific allergen free soap and shampoo    PHYSICAL EXAM: General: The patient is  alert and oriented x3 in no acute distress.  Dermatology: Skin is warm, dry and supple bilateral lower extremities. Interspaces are clear of maceration and debris.      Wound 1:  Location: Plantar lateral left fifth metatarsal base        Depth: Full-thickness to subcutaneous tissue        Wound Border: Hyperkeratotic        Wound Base: Fibrotic with 2 small pieces of foreign debris within wound        Drainage: Serosanguineous       Odor?:  None        Surrounding Tissue: No erythema or edema        Infected?:  No        Necrosis?:  No        Pain?:  No        Tunneling: No       Dimensions (cm): 0.9 x 0.5 x 0.4 cm   Vascular: Pedal pulses are palpable  Neurological: Light touch sensation absent   ASSESSMENT / PLAN OF CARE: 1. Diabetic ulcer of left midfoot associated with type 2 diabetes mellitus, with fat layer exposed (HCC)     The ulceration was sharply debrided of hyperkeratotic and devitalized soft tissue with sterile #312 blade to the level of subcutaneous tissue.  Hemostasis obtained.  Antibiotic ointment, Hydrofera Blue, and DSD applied.  Reviewed off-loading with patient.  He was given some courtesy packages of Hydrofera Blue to apply to the wound at home since he has noticed some increased drainage.  Reviewed daily dressing changes with patient.  Discussed risks / concerns regarding ulcer with patient and possible sequelae if left untreated.  Stressed importance of infection prevention at home. Short-term goals are: prevent infection, off-load ulcer, heal ulcer Long-term goals are:  prevent recurrence, prevent amputation.   Follow-up in 2 weeks for ulcer recheck   Cainan Trull DSABRA Imperial, DPM, FACFAS [facility]     [address] [place], [place] [address]                Office [phone]  Fax [phone]

## 2024-03-28 ENCOUNTER — Encounter: Payer: Self-pay | Admitting: Podiatry

## 2024-03-28 ENCOUNTER — Ambulatory Visit: Admitting: Podiatry

## 2024-03-28 DIAGNOSIS — L97422 Non-pressure chronic ulcer of left heel and midfoot with fat layer exposed: Secondary | ICD-10-CM | POA: Diagnosis not present

## 2024-03-28 DIAGNOSIS — Z89422 Acquired absence of other left toe(s): Secondary | ICD-10-CM

## 2024-03-28 DIAGNOSIS — E11621 Type 2 diabetes mellitus with foot ulcer: Secondary | ICD-10-CM

## 2024-03-28 DIAGNOSIS — L03116 Cellulitis of left lower limb: Secondary | ICD-10-CM

## 2024-03-28 MED ORDER — LINEZOLID 600 MG PO TABS: 600.0000 mg | ORAL_TABLET | Freq: Two times a day (BID) | ORAL | 0 refills | Status: AC

## 2024-03-28 NOTE — Progress Notes (Signed)
 Chief Complaint  Patient presents with   Wound Check     2 wk Left foot lateral ulcer check. Using medi honey guaze dressing. 5 pain. IDDM A1C 5.8      HPI: 68 y.o. male presenting today for scheduled follow-up of his ulcer on the lateral left midfoot.  He feels that there has been some discomfort recently, where normally he is neuropathic.  He acknowledges that he was on his feet a lot over the weekend still working on restoring his old car.  States he is almost finished.  He presents in his usual house slippers because he will not wear his surgical shoe.  Denies fever, chills, nausea/vomiting  Past Medical History:  Diagnosis Date   Acute osteomyelitis of left foot (HCC) 03/16/2015   Asthma    bronchitis to asthma many years   CAD, NATIVE VESSEL 04/16/2008   Qualifier: Diagnosis of  By: Rolan, MD, Dalton     Cancer [facility])    non-hodgkins lymphoma  2000  recd radiation   Cellulitis and abscess of leg, except foot 02/21/2015   Colon cancer screening 01/30/2015   Diabetic ulcer of left foot with necrosis of muscle (HCC) 02/21/2015   Dyslipidemia associated with type 2 diabetes mellitus (HCC) 03/16/2015   Encounter for long-term (current) use of other medications 10/16/2011   Essential hypertension 04/16/2008   Qualifier: Diagnosis of  By: Rolan, MD, Dalton     Hyperlipidemia with target LDL less than 100 04/16/2008        Hypertriglyceridemia 03/26/2014   Left carotid bruit 12/11/2014   Myocardial infarction [facility])    2009   Osteoarthritis of right hip 04/09/2012   PAD (peripheral artery disease) 03/26/2014   Peripheral vascular disease, unspecified 11/17/2011   Pneumonia    As a child   Septic arthritis of left foot (HCC) 03/16/2015   Type 2 diabetes mellitus with vascular disease (HCC) 06/22/2009   Qualifier: Diagnosis of  By: Lajoyce, RN, BSN, Anne     Uncontrolled diabetes mellitus type 2 with peripheral artery disease 03/16/2015   Past Surgical History:  Procedure  Laterality Date   AMPUTATION Left 10/09/2017   Procedure: LEFT 5TH RAY AMPUTATION;  Surgeon: Harden Jerona GAILS, MD;  Location: [facility] OR;  Service: Orthopedics;  Laterality: Left;   CARDIAC CATHETERIZATION     2011   FEMORAL-TIBIAL BYPASS GRAFT Left 05/04/2015   Procedure: LEFT  FEMORAL-BELOW THE KNEE POPLITEAL  ARTERY BYPASS GRAFT USING NON REVERSE LEFT GREATER SAPHENOUS VEIN;  Surgeon: Gaile LELON New, MD;  Location: [facility] OR;  Service: Vascular;  Laterality: Left;   INTRAOPERATIVE ARTERIOGRAM Left 05/04/2015   Procedure: INTRA OPERATIVE ARTERIOGRAM TIMES TWO;  Surgeon: Gaile LELON New, MD;  Location: [facility] OR;  Service: Vascular;  Laterality: Left;   LEG SURGERY     on bone above ankle-not sure which bone left leg 1981   LEG SURGERY  1981   Bone cyst- Left leg   MULTIPLE EXTRACTIONS WITH ALVEOLOPLASTY  10/31/2011   Procedure: MULTIPLE EXTRACION WITH ALVEOLOPLASTY;  Surgeon: Tanda JULIANNA Fanny, DDS;  Location: [facility] OR;  Service: Oral Surgery;  Laterality: N/A;  Extraction of tooth #'s 2, 4,5,6,7,8,9,10,11,12,13,15,21,22,23,24,25,26,27,28, 29, and 31 with alveoloplasty   PERIPHERAL VASCULAR CATHETERIZATION N/A 04/05/2015   Procedure: Abdominal Aortogram w/Lower Extremity;  Surgeon: Redell LITTIE Door, MD;  Location: [facility] INVASIVE CV LAB;  Service: Cardiovascular;  Laterality: N/A;   PR VEIN BYPASS GRAFT,AORTO-FEM-POP     TONSILLECTOMY     TOTAL HIP ARTHROPLASTY Left 12/16/2019  Procedure: LEFT TOTAL HIP ARTHROPLASTY ANTERIOR APPROACH;  Surgeon: Vernetta Lonni GRADE, MD;  Location: [facility] ORS;  Service: Orthopedics;  Laterality: Left;   Allergies  Allergen Reactions   Fenofibrate  Other (See Comments)    Joint pain/cramping/burning    Pioglitazone Swelling and Rash    Edema unspecified peripheral edema   Crestor [Rosuvastatin]    Chlorhexidine  Gluconate Rash   Orange Oil Rash   Soap Other (See Comments)    States that most soaps make him break out/rash-he uses a sensitive skin soap and shampoos as well, has to  use specific allergen free soap and shampoo    PHYSICAL EXAM: General: The patient is alert and oriented x3 in no acute distress.  Dermatology: Skin is warm, dry and supple bilateral lower extremities. Interspaces are clear of maceration and debris.      Wound 1:  Location: Left lateral midfoot at fifth metatarsal base        Depth: Full-thickness to subcutaneous tissue        Wound Border: Minimal hyperkeratosis        Wound Base: Fibrogranular        Drainage: Serosanguineous, minimal       Odor?:  None        Surrounding Tissue: Localized erythema, warmth, with minimal edema periwound        Infected?:  Cellulitis, yes        Necrosis?:  No        Pain?:  Minimal        Tunneling: No       Dimensions (cm): 0.8 x 0.4 x 0.3 cm postdebridement (photo is predebridement)   Vascular: Pedal pulses are trace palpable  Neurological: Light touch sensation absent  ASSESSMENT / PLAN OF CARE: 1. Diabetic ulcer of left midfoot associated with type 2 diabetes mellitus, with fat layer exposed (HCC)   2. Cellulitis of left foot   3. History of amputation of lesser toe of left foot      Meds ordered this encounter  Medications   linezolid  (ZYVOX ) 600 MG tablet    Sig: Take 1 tablet (600 mg total) by mouth 2 (two) times daily for 7 days.    Dispense:  14 tablet    Refill:  0   MR FOOT LEFT WO CONTRAST  The ulceration was sharply debrided of hyperkeratotic and devitalized soft tissue with sterile #312 blade to the level of subcutaneous tissue.  Hemostasis obtained.  Antibiotic ointment and DSD applied.  Reviewed off-loading with patient.  Recommend a STAT MRI without contrast at this time.  This wound had been showing good signs of healing, but over the past month it has slightly worsened.  The fibrous plug is still present, but to a lesser extent since he has been using the Santyl /Medihoney ointment.  He had to have of the toe on that foot removed due to osteomyelitis.  Now having suspicion  that he is developing osteomyelitis into the fifth metatarsal base.  Prescription for linezolid  sent to the patient's pharmacy, one 600 mg tablet twice daily x 7 days.  He was instructed to take all the medication until finished.  Reviewed daily dressing changes with patient.  Discussed risks / concerns regarding ulcer with patient and possible sequelae if left untreated.  Stressed importance of infection prevention at home. Short-term goals are: Resolve infection, off-load ulcer, heal ulcer Long-term goals are:  prevent recurrence, prevent amputation.   Return in about 2 weeks (around 04/11/2024) for f/u ulcer & review  MRI.   Awanda CHARM Imperial, DPM, FACFAS [facility]     [address] [place], [place] [address]                Office [phone]  Fax [phone]

## 2024-04-11 ENCOUNTER — Ambulatory Visit: Admitting: Podiatry

## 2024-04-11 ENCOUNTER — Encounter: Payer: Self-pay | Admitting: Podiatry

## 2024-04-11 DIAGNOSIS — E11621 Type 2 diabetes mellitus with foot ulcer: Secondary | ICD-10-CM

## 2024-04-11 DIAGNOSIS — L97422 Non-pressure chronic ulcer of left heel and midfoot with fat layer exposed: Secondary | ICD-10-CM

## 2024-04-11 NOTE — Progress Notes (Signed)
 Chief Complaint  Patient presents with   Diabetic Ulcer    F/U ulcer on the lateral left midfoot. No MRI done. 2 pain. Using Medi honey.   HPI: 67 y.o. male presenting today for follow-up of ulcer to the left fifth metatarsal base.  He never got the MRI performed which was ordered as a STAT order.  He states that no one called him or reach out to him to get this set up.  Informed the patient that we will need to check into this while he still here today to make sure this is scheduled.  He did take all the antibiotic and stated that he did have some stomach discomfort while taking it.  He states his foot feels much better since taking the antibiotic.  He has finished the medication.  Past Medical History:  Diagnosis Date   Acute osteomyelitis of left foot (HCC) 03/16/2015   Asthma    bronchitis to asthma many years   CAD, NATIVE VESSEL 04/16/2008   Qualifier: Diagnosis of  By: Rolan, MD, Dalton     Cancer [facility])    non-hodgkins lymphoma  2000  recd radiation   Cellulitis and abscess of leg, except foot 02/21/2015   Colon cancer screening 01/30/2015   Diabetic ulcer of left foot with necrosis of muscle (HCC) 02/21/2015   Dyslipidemia associated with type 2 diabetes mellitus (HCC) 03/16/2015   Encounter for long-term (current) use of other medications 10/16/2011   Essential hypertension 04/16/2008   Qualifier: Diagnosis of  By: Rolan, MD, Dalton     Hyperlipidemia with target LDL less than 100 04/16/2008        Hypertriglyceridemia 03/26/2014   Left carotid bruit 12/11/2014   Myocardial infarction [facility])    2009   Osteoarthritis of right hip 04/09/2012   PAD (peripheral artery disease) 03/26/2014   Peripheral vascular disease, unspecified 11/17/2011   Pneumonia    As a child   Septic arthritis of left foot (HCC) 03/16/2015   Type 2 diabetes mellitus with vascular disease (HCC) 06/22/2009   Qualifier: Diagnosis of  By: Lajoyce, RN, BSN, Anne     Uncontrolled diabetes mellitus type 2  with peripheral artery disease 03/16/2015   Past Surgical History:  Procedure Laterality Date   AMPUTATION Left 10/09/2017   Procedure: LEFT 5TH RAY AMPUTATION;  Surgeon: Harden Jerona GAILS, MD;  Location: [facility] OR;  Service: Orthopedics;  Laterality: Left;   CARDIAC CATHETERIZATION     2011   FEMORAL-TIBIAL BYPASS GRAFT Left 05/04/2015   Procedure: LEFT  FEMORAL-BELOW THE KNEE POPLITEAL  ARTERY BYPASS GRAFT USING NON REVERSE LEFT GREATER SAPHENOUS VEIN;  Surgeon: Gaile LELON New, MD;  Location[facility] OR;  Service: Vascular;  Laterality: Left;   INTRAOPERATIVE ARTERIOGRAM Left 05/04/2015   Procedure: INTRA OPERATIVE ARTERIOGRAM TIMES TWO;  Surgeon: Gaile LELON New, MD;  Location: [facility] OR;  Service: Vascular;  Laterality: Left;   LEG SURGERY     on bone above ankle-not sure which bone left leg 1981   LEG SURGERY  1981   Bone cyst- Left leg   MULTIPLE EXTRACTIONS WITH ALVEOLOPLASTY  10/31/2011   Procedure: MULTIPLE EXTRACION WITH ALVEOLOPLASTY;  Surgeon: Tanda JULIANNA Fanny, DDS;  Location: [facility] OR;  Service: Oral Surgery;  Laterality: N/A;  Extraction of tooth #'s 2, 4,5,6,7,8,9,10,11,12,13,15,21,22,23,24,25,26,27,28, 29, and 31 with alveoloplasty   PERIPHERAL VASCULAR CATHETERIZATION N/A 04/05/2015   Procedure: Abdominal Aortogram w/Lower Extremity;  Surgeon: Redell LITTIE Door, MD;  Location: [facility] INVASIVE CV LAB;  Service: Cardiovascular;  Laterality: N/A;  PR VEIN BYPASS GRAFT,AORTO-FEM-POP     TONSILLECTOMY     TOTAL HIP ARTHROPLASTY Left 12/16/2019   Procedure: LEFT TOTAL HIP ARTHROPLASTY ANTERIOR APPROACH;  Surgeon: Vernetta Lonni GRADE, MD;  [facility] ORS;  Service: Orthopedics;  Laterality: Left;   Allergies  Allergen Reactions   Fenofibrate  Other (See Comments)    Joint pain/cramping/burning    Pioglitazone Swelling and Rash    Edema unspecified peripheral edema   Crestor [Rosuvastatin]    Chlorhexidine  Gluconate Rash   Orange Oil Rash   Soap Other (See Comments)    States that most soaps  make him break out/rash-he uses a sensitive skin soap and shampoos as well, has to use specific allergen free soap and shampoo   PHYSICAL EXAM: General: The patient is alert and oriented x3 in no acute distress.  Dermatology: Skin is warm, dry and supple bilateral lower extremities. Interspaces are clear of maceration and debris.      Wound 1:  Location: Plantar lateral left fifth metatarsal base        Depth: Full-thickness to subcutaneous tissue        Wound Border: Mild hyperkeratosis        Wound Base: 90% fibrotic 10% granular        Drainage: Minimal serosanguineous       Odor?:  No malodor is noted        Surrounding Tissue: No edema, erythema or calor noted        Infected?:  Resolved        Necrosis?:  None        Pain?:  Minimal        Tunneling: None       Dimensions (cm): 0.8 x 0.5 x 0.4 cm postdebridement (photo is predebridement)    Vascular: Pedal pulses are palpable   ASSESSMENT / PLAN OF CARE: 1. Diabetic ulcer of left midfoot associated with type 2 diabetes mellitus, with fat layer exposed (HCC)     The ulceration was sharply debrided of hyperkeratotic and devitalized soft tissue with sterile #312 blade to the level of subcutaneous tissue.  Hemostasis obtained.  Antibiotic ointment and DSD applied.  Reviewed off-loading with patient.  He is still wearing his flimsy house slippers and working on his car restoration.  There is no offloading of the area per patient preference.  Reviewed daily dressing changes with patient.  He will continue with Santyl  ointment and gauze dressing.  Spoke to our nurse triage office in the preauthorization department and it seems that the patient had been contacted but his phone number was not correct.  The patient was then addressed and he stated that he had to get a new phone number because he was having difficulty with his old phone, this was updated in the system and he was informed that they will be calling him to get this set  up.  Discussed risks / concerns regarding ulcer with patient and possible sequelae if left untreated.  Stressed importance of infection prevention at home. Short-term goals are: prevent infection, off-load ulcer, heal ulcer Long-term goals are:  prevent recurrence, prevent amputation.   Return in about 2 weeks (around 04/25/2024) for f/u ulcer.   Awanda CHARM Imperial, DPM, FACFAS [facility]     [address]  [place], [place] [address]                Office [phone]  Fax [phone]

## 2024-05-02 ENCOUNTER — Encounter: Payer: Self-pay | Admitting: Podiatry

## 2024-05-02 ENCOUNTER — Ambulatory Visit: Admitting: Podiatry

## 2024-05-02 ENCOUNTER — Ambulatory Visit (INDEPENDENT_AMBULATORY_CARE_PROVIDER_SITE_OTHER)

## 2024-05-02 DIAGNOSIS — L97522 Non-pressure chronic ulcer of other part of left foot with fat layer exposed: Secondary | ICD-10-CM

## 2024-05-02 DIAGNOSIS — E11621 Type 2 diabetes mellitus with foot ulcer: Secondary | ICD-10-CM

## 2024-05-02 DIAGNOSIS — L03116 Cellulitis of left lower limb: Secondary | ICD-10-CM | POA: Diagnosis not present

## 2024-05-02 MED ORDER — CLINDAMYCIN HCL 150 MG PO CAPS: 150.0000 mg | ORAL_CAPSULE | Freq: Three times a day (TID) | ORAL | 0 refills | Status: DC

## 2024-05-02 NOTE — Progress Notes (Unsigned)
 "   Chief Complaint  Patient presents with   Diabetic Ulcer    F/U ulcers left plantar foot. Using medi honey. IDDM A1C 6.6. Clear drainage. 5 pain.    HPI: 68 y.o. male presenting today for follow-up of chronic ulceration to the lateral aspect of the left midfoot.  He has been dealing with this for the majority of 2025.  He will not wear a surgical offloading shoe, as he has been restoring an old car this entire year and cannot wear surgical shoe in the garage.  He always wears house slippers for his appointments.  Treatment plan compliance has been an issue.    Upon removal of his Band-Aid that was covering the fifth metatarsal base ulceration, a new wound was also seen left submet 4 area.  Patient does not recall any injury or when the wound originated.  This is new.  Denies fever or chills or night sweats.  He states his wife feels the wound looks better on the lateral midfoot.  He applies Medihoney to the wound and claims that he covers it with gauze dressing at home.  Past Medical History:  Diagnosis Date   Acute osteomyelitis of left foot (HCC) 03/16/2015   Asthma    bronchitis to asthma many years   CAD, NATIVE VESSEL 04/16/2008   Qualifier: Diagnosis of  By: Rolan, MD, Dalton     Cancer [facility]    non-hodgkins lymphoma  2000  recd radiation   Cellulitis and abscess of leg, except foot 02/21/2015   Colon cancer screening 01/30/2015   Diabetic ulcer of left foot with necrosis of muscle (HCC) 02/21/2015   Dyslipidemia associated with type 2 diabetes mellitus (HCC) 03/16/2015   Encounter for long-term (current) use of other medications 10/16/2011   Essential hypertension 04/16/2008   Qualifier: Diagnosis of  By: Rolan, MD, Dalton     Hyperlipidemia with target LDL less than 100 04/16/2008        Hypertriglyceridemia 03/26/2014   Left carotid bruit 12/11/2014   Myocardial infarction [facility])    2009   Osteoarthritis of right hip 04/09/2012   PAD (peripheral artery disease) 03/26/2014    Peripheral vascular disease, unspecified 11/17/2011   Pneumonia    As a child   Septic arthritis of left foot (HCC) 03/16/2015   Type 2 diabetes mellitus with vascular disease (HCC) 06/22/2009   Qualifier: Diagnosis of  By: Lajoyce, RN, BSN, Anne     Uncontrolled diabetes mellitus type 2 with peripheral artery disease 03/16/2015   Past Surgical History:  Procedure Laterality Date   AMPUTATION Left 10/09/2017   Procedure: LEFT 5TH RAY AMPUTATION;  Surgeon: Harden Jerona GAILS, MD;  Location: [facility] OR;  Service: Orthopedics;  Laterality: Left;   CARDIAC CATHETERIZATION     2011   FEMORAL-TIBIAL BYPASS GRAFT Left 05/04/2015   Procedure: LEFT  FEMORAL-BELOW THE KNEE POPLITEAL  ARTERY BYPASS GRAFT USING NON REVERSE LEFT GREATER SAPHENOUS VEIN;  Surgeon: Gaile LELON New, MD;  Location: [facility] OR;  Service: Vascular;  Laterality: Left;   INTRAOPERATIVE ARTERIOGRAM Left 05/04/2015   Procedure: INTRA OPERATIVE ARTERIOGRAM TIMES TWO;  Surgeon: Gaile LELON New, MD;  Location: [facility] OR;  Service: Vascular;  Laterality: Left;   LEG SURGERY     on bone above ankle-not sure which bone left leg 1981   LEG SURGERY  1981   Bone cyst- Left leg   MULTIPLE EXTRACTIONS WITH ALVEOLOPLASTY  10/31/2011   Procedure: MULTIPLE EXTRACION WITH ALVEOLOPLASTY;  Surgeon: Ronald F Kulinski, DDS;  Location: [facility] OR;  Service: Oral Surgery;  Laterality: N/A;  Extraction of tooth #'s 2, 4,5,6,7,8,9,10,11,12,13,15,21,22,23,24,25,26,27,28, 29, and 31 with alveoloplasty   PERIPHERAL VASCULAR CATHETERIZATION N/A 04/05/2015   Procedure: Abdominal Aortogram w/Lower Extremity;  Surgeon: Redell LITTIE Door, MD;  Location: [facility] INVASIVE CV LAB;  Service: Cardiovascular;  Laterality: N/A;   PR VEIN BYPASS GRAFT,AORTO-FEM-POP     TONSILLECTOMY     TOTAL HIP ARTHROPLASTY Left 12/16/2019   Procedure: LEFT TOTAL HIP ARTHROPLASTY ANTERIOR APPROACH;  Surgeon: Vernetta Lonni GRADE, MD;  Location: [facility] ORS;  Service: Orthopedics;  Laterality: Left;    Allergies[1] {Smoker?:15292:::1}   PHYSICAL EXAM: General: The patient is alert and oriented x3 in no acute distress.  Dermatology: Skin is warm, dry and supple bilateral lower extremities. Interspaces are clear of maceration and debris.      Wound 1:  Location: Left submet 4 (new wound)        Depth: Full-thickness to subcutaneous tissue        Wound Border: Friable macerated edges with dried blood present        Wound Base: Granular        Drainage: Minimal bloody drainage       Odor?:  No malodor        Surrounding Tissue: Minimal increased warmth, no calor        Infected?:  Mildly        Necrosis?:  No        Pain?:  Minimal        Tunneling: No       Dimensions (cm): 0.9 x 0.8 x 0.4 cm postdebridement (photo is predebridement)   Wound 2:  Location: Left lateral midfoot/fifth metatarsal base       Depth: Full-thickness to subcutaneous tissue       Wound Border: Macerated       Wound Base: Fibrous       Drainage: Serosanguineous/slight increased amount from previous visit       Odor?:  None       Surrounding Tissue: Localized erythema with no calor present.       Infected?:  Possibly mild cellulitis starting       Necrosis?:  No       Pain?:  No       Tunneling: No       Dimensions (cm): 0.9 x 0.7 x 0.4 cm postdebridement (photo is predebridement)   Vascular: Pedal pulses are trace palpable (history of lower extremity femoropopliteal bypass left -and states he follows up with vascular even though no recent imaging is noted in Epic)  Neurological: Light touch sensation diminished  Musculoskeletal Exam: Previous history of left fifth partial ray resection  RADIOGRAPHIC EXAM (left foot, 3 weightbearing views, 05/02/2024):  Normal osseous mineralization.  No evidence of gas within the soft tissues or osteolysis at the fifth metatarsal base, nor at the fourth metatarsal head.  *Patient is still awaiting his left foot MRI which was ordered over a month ago as a stat  order  ASSESSMENT / PLAN OF CARE: 1. Type 2 diabetes mellitus with foot ulcer (CODE) (HCC)   2. Ulcer of left foot, with fat layer exposed (HCC)   3. Cellulitis of left foot      Meds ordered this encounter  Medications   clindamycin  (CLEOCIN ) 150 MG capsule    Sig: Take 1 capsule (150 mg total) by mouth 3 (three) times daily.    Dispense:  30 capsule    Refill:  0  Culture swabs of the wound(s) were obtained today of the new wound left submet 4 and sent to [facility] labs.  The ulcerations were sharply debrided of hyperkeratotic, fibrotic and devitalized soft tissue with sterile #312 blade to the level of subcutaneous tissue.  Hemostasis obtained with Lumicain.  Antibiotic ointment and DSD applied.  Reviewed off-loading with patient although he refuses to wear the offloading surgical shoe.  Reviewed daily dressing changes with patient.  Prescription for clindamycin  150 mg 3 times daily for 10 days sent to his pharmacy on file.  Discussed risks / concerns regarding ulcer with patient and possible sequelae if left untreated.  Stressed importance of infection prevention at home. Short-term goals are: prevent infection, off-load ulcer, heal ulcer Long-term goals are:  prevent recurrence, prevent amputation.   Return for 1-2 weeks ulcer recheck with Dr. Silva (waiting on MRI still).   Awanda CHARM Imperial, DPM, FACFAS [facility]     [address] [place] [address]                Office [phone]  Fax [phone]     [1]  Allergies Allergen Reactions   Fenofibrate  Other (See Comments)    Joint pain/cramping/burning    Pioglitazone Swelling and Rash    Edema unspecified peripheral edema   Crestor [Rosuvastatin]    Chlorhexidine  Gluconate Rash   Orange Oil Rash   Soap Other (See Comments)    States that most soaps make him break out/rash-he uses a sensitive skin soap and shampoos as well, has to use specific  allergen free soap and shampoo   "

## 2024-05-06 ENCOUNTER — Ambulatory Visit: Payer: Self-pay | Admitting: Podiatry

## 2024-05-06 MED ORDER — LEVOFLOXACIN 500 MG PO TABS: 500.0000 mg | ORAL_TABLET | Freq: Every day | ORAL | 0 refills | Status: AC

## 2024-05-06 MED ORDER — SULFAMETHOXAZOLE-TRIMETHOPRIM 800-160 MG PO TABS: 1.0000 | ORAL_TABLET | Freq: Two times a day (BID) | ORAL | 0 refills | Status: AC

## 2024-05-06 NOTE — Progress Notes (Signed)
 Patient grew out several different organisms from the wound swab on the bottom of his foot.  The 2 best medications to be combined to treat these organisms are Levaquin  and Bactrim .  I placed a new order for both of those medications and sent to his pharmacy just now.  Please inform patient so that he can go pick this up.  If he still taking any clindamycin  he can go ahead and discontinue this.

## 2024-05-17 ENCOUNTER — Ambulatory Visit (INDEPENDENT_AMBULATORY_CARE_PROVIDER_SITE_OTHER): Admitting: Podiatry

## 2024-05-17 VITALS — Ht 70.0 in

## 2024-05-17 DIAGNOSIS — I739 Peripheral vascular disease, unspecified: Secondary | ICD-10-CM

## 2024-05-17 DIAGNOSIS — I70344 Atherosclerosis of unspecified type of bypass graft(s) of the left leg with ulceration of heel and midfoot: Secondary | ICD-10-CM | POA: Diagnosis not present

## 2024-05-17 NOTE — Patient Instructions (Signed)
 Call to schedule your vascular Consultation:   [name]. [facility] [address] [place],  [place]  [address]   Main: [phone]  Call [facility] to schedule your MRI at the below locations.  Please allow at least 1 business day after your visit to process the referral.  It may take longer depending on approval from insurance.  Please let me know if you have issues or problems scheduling the MRI   [facility] [phone] [address] [place] [address] [place], [place] [address]  [facility] [phone] [address] [place], [place] [address]

## 2024-05-19 NOTE — Progress Notes (Signed)
"  °  Subjective:  Patient ID: [name], male    DOB: 06-07-1955,  MRN: [id number]  Chief Complaint  Patient presents with   Diabetic Ulcer    Rm 4 Patient is here is for diabetic ulcer of the left foot. Two open wounds on the left foot ( ball and lateral border), wounds are open with moderate drainage.    69 y.o. male presents with the above complaint. History confirmed with patient.  He was referred to me by Dr. Loel.  He has been using an antibiotic ointment on this.  He is a new ulcer under the fourth toe..  Objective:  Physical Exam: Pulses are nonpalpable he has thin shiny atrophic skin.  Dependent rubor.  Full-thickness ulcerations on the submetatarsal 4 area and fifth metatarsal base      MRI has been ordered but he has not scheduled yet.  Prior radiographs taken on 05/02/2024 did not show any discrete erosions or soft tissue emphysema  Assessment:   1. PAD (peripheral artery disease)   2. Atherosclerosis of bypass graft of left lower extremity with ulceration of midfoot (HCC)      Plan:  Patient was evaluated and treated and all questions answered.  We reviewed his progression, has not had much healing of his ulceration so far.  He is utilizing mupirocin  ointment.  He has a history of lower extremity bypass with Dr. Serene and is still a 1 pack/day smoker.  He has not had noninvasive vascular testing or follow-up with vascular surgery in a number of years.  I recommended a new arterial duplex and follow-up with Dr. Serene ASAP as I suspect likely his PAD is worsening again and contributing to the development and nonhealing of his wounds.  The fourth metatarsal wound is quite deep and probes to capsule.  I lightly debrided the wound of nonviable tissue today.  There is minimal bleeding.  He is at high risk of amputation.  I encouraged him to have his MRI scheduled and vascular surgery consultation and testing scheduled ASAP.  Numbers were provided to him and referrals  were placed.  Continue mupirocin  ointment and bandage daily.  Follow-up with me in 3 weeks to reevaluate.  Return precautions for the office and ER given if he develops any fever chills nausea vomiting or purulence.  Return in about 3 weeks (around 06/07/2024) for wound care.   "

## 2024-06-10 ENCOUNTER — Ambulatory Visit: Admitting: Podiatry

## 2024-06-10 DIAGNOSIS — I739 Peripheral vascular disease, unspecified: Secondary | ICD-10-CM

## 2024-06-10 DIAGNOSIS — E11621 Type 2 diabetes mellitus with foot ulcer: Secondary | ICD-10-CM

## 2024-06-10 MED ORDER — PREGABALIN 75 MG PO CAPS: 75.0000 mg | ORAL_CAPSULE | Freq: Two times a day (BID) | ORAL | 0 refills | Status: AC

## 2024-06-10 NOTE — Progress Notes (Unsigned)
 "   Chief Complaint  Patient presents with   Wound Check    Ulcers X 2 on the left foot Both are open Sub-met 4   measuring in CM 1.5 X 1.0 X 0.5 macerated Lateral side   0.5 X 0.5 X 0.5 A1c 5.8, six months ago ASA, no longer taking plavix .     HPI: 69 y.o. male presenting today ***  Past Medical History:  Diagnosis Date   Acute osteomyelitis of left foot (HCC) 03/16/2015   Asthma    bronchitis to asthma many years   CAD, NATIVE VESSEL 04/16/2008   Qualifier: Diagnosis of  By: Rolan, MD, Dalton     Cancer [facility])    non-hodgkins lymphoma  2000  recd radiation   Cellulitis and abscess of leg, except foot 02/21/2015   Colon cancer screening 01/30/2015   Diabetic ulcer of left foot with necrosis of muscle (HCC) 02/21/2015   Dyslipidemia associated with type 2 diabetes mellitus (HCC) 03/16/2015   Encounter for long-term (current) use of other medications 10/16/2011   Essential hypertension 04/16/2008   Qualifier: Diagnosis of  By: Rolan, MD, Dalton     Hyperlipidemia with target LDL less than 100 04/16/2008        Hypertriglyceridemia 03/26/2014   Left carotid bruit 12/11/2014   Myocardial infarction [facility])    2009   Osteoarthritis of right hip 04/09/2012   PAD (peripheral artery disease) 03/26/2014   Peripheral vascular disease, unspecified 11/17/2011   Pneumonia    As a child   Septic arthritis of left foot (HCC) 03/16/2015   Type 2 diabetes mellitus with vascular disease (HCC) 06/22/2009   Qualifier: Diagnosis of  By: Lajoyce, RN, BSN, Anne     Uncontrolled diabetes mellitus type 2 with peripheral artery disease 03/16/2015   Past Surgical History:  Procedure Laterality Date   AMPUTATION Left 10/09/2017   Procedure: LEFT 5TH RAY AMPUTATION;  Surgeon: Harden Jerona GAILS, MD;  Location: [facility] OR;  Service: Orthopedics;  Laterality: Left;   CARDIAC CATHETERIZATION     2011   FEMORAL-TIBIAL BYPASS GRAFT Left 05/04/2015   Procedure: LEFT  FEMORAL-BELOW THE KNEE POPLITEAL  ARTERY BYPASS  GRAFT USING NON REVERSE LEFT GREATER SAPHENOUS VEIN;  Surgeon: Gaile LELON New, MD;  Location: [facility] OR;  Service: Vascular;  Laterality: Left;   INTRAOPERATIVE ARTERIOGRAM Left 05/04/2015   Procedure: INTRA OPERATIVE ARTERIOGRAM TIMES TWO;  Surgeon: Gaile LELON New, MD;  Location: [facility] OR;  Service: Vascular;  Laterality: Left;   LEG SURGERY     on bone above ankle-not sure which bone left leg 1981   LEG SURGERY  1981   Bone cyst- Left leg   MULTIPLE EXTRACTIONS WITH ALVEOLOPLASTY  10/31/2011   Procedure: MULTIPLE EXTRACION WITH ALVEOLOPLASTY;  Surgeon: Tanda JULIANNA Fanny, DDS;  Location: [facility] OR;  Service: Oral Surgery;  Laterality: N/A;  Extraction of tooth #'s 2, 4,5,6,7,8,9,10,11,12,13,15,21,22,23,24,25,26,27,28, 29, and 31 with alveoloplasty   PERIPHERAL VASCULAR CATHETERIZATION N/A 04/05/2015   Procedure: Abdominal Aortogram w/Lower Extremity;  Surgeon: Redell LITTIE Door, MD;  Location: [facility] INVASIVE CV LAB;  Service: Cardiovascular;  Laterality: N/A;   PR VEIN BYPASS GRAFT,AORTO-FEM-POP     TONSILLECTOMY     TOTAL HIP ARTHROPLASTY Left 12/16/2019   Procedure: LEFT TOTAL HIP ARTHROPLASTY ANTERIOR APPROACH;  Surgeon: Vernetta Lonni GRADE, MD;  Location: [facility] ORS;  Service: Orthopedics;  Laterality: Left;   Allergies[1] {Smoker?:15292:::1}   PHYSICAL EXAM: There were no vitals filed for this visit.  General: The patient is alert and oriented  x3 in no acute distress.  Dermatology: Skin is warm, dry and supple bilateral lower extremities. Interspaces are clear of maceration and debris.      Wound 1:  Location:  ***        Depth:  ***        Wound Border:  ***        Wound Base:  ***        Drainage: ***       Odor?:  ***        Surrounding Tissue:  ***        Infected?:  ***        Necrosis?:  ***        Pain?:  ***        Tunneling:  ***       Dimensions (cm):  ***  Wound 2:  Location:  ***       Depth:  ***       Wound Border:  ***       Wound Base:  ***       Drainage: ***        Odor?:  ***       Surrounding Tissue:  ***       Infected?:  ***       Necrosis?:  ***       Pain?:  ***       Tunneling:  ***       Dimensions (cm):  ***  Vascular: Pedal pulses are ***  Neurological: Light touch sensation ***  Musculoskeletal Exam: ***      No data to display           RADIOGRAPHIC EXAM:  Normal osseous mineralization.   ASSESSMENT / PLAN OF CARE: No diagnosis found.   No orders of the defined types were placed in this encounter.  None  Culture swabs of the wound(s) were obtained today.  The ulceration was sharply debrided of hyperkeratotic and devitalized soft tissue with sterile #312 blade to the level of *** .  Hemostasis obtained.  Antibiotic ointment and DSD applied.  Reviewed off-loading with patient.  Surgical shoe dispensed for patient to wear at all times WB.   Reviewed daily dressing changes with patient.  Discussed risks / concerns regarding ulcer with patient and possible sequelae if left untreated.  Stressed importance of infection prevention at home. Short-term goals are: prevent infection, off-load ulcer, heal ulcer Long-term goals are:  prevent recurrence, prevent amputation.   No follow-ups on file.   Awanda CHARM Imperial, DPM, FACFAS [facility]     [address] [place] [address]                Office [phone]  Fax [phone]    [1]  Allergies Allergen Reactions   Fenofibrate  Other (See Comments)    Joint pain/cramping/burning    Pioglitazone Swelling and Rash    Edema unspecified peripheral edema   Crestor [Rosuvastatin]    Chlorhexidine  Gluconate Rash   Orange Oil Rash   Soap Other (See Comments)    States that most soaps make him break out/rash-he uses a sensitive skin soap and shampoos as well, has to use specific allergen free soap and shampoo   "

## 2024-06-13 ENCOUNTER — Ambulatory Visit (HOSPITAL_COMMUNITY)

## 2024-06-17 ENCOUNTER — Ambulatory Visit
